# Patient Record
Sex: Female | Born: 1935 | Race: White | Hispanic: No | Smoking: Former smoker
Health system: Southern US, Community
[De-identification: ages and names within clinical notes are randomized; demographics above are authoritative.]

## PROBLEM LIST (undated history)

## (undated) DIAGNOSIS — F329 Major depressive disorder, single episode, unspecified: Secondary | ICD-10-CM

## (undated) DIAGNOSIS — I10 Essential (primary) hypertension: Secondary | ICD-10-CM

## (undated) DIAGNOSIS — R011 Cardiac murmur, unspecified: Secondary | ICD-10-CM

## (undated) DIAGNOSIS — I350 Nonrheumatic aortic (valve) stenosis: Secondary | ICD-10-CM

## (undated) DIAGNOSIS — R479 Unspecified speech disturbances: Secondary | ICD-10-CM

## (undated) DIAGNOSIS — F1010 Alcohol abuse, uncomplicated: Secondary | ICD-10-CM

## (undated) DIAGNOSIS — J449 Chronic obstructive pulmonary disease, unspecified: Secondary | ICD-10-CM

## (undated) DIAGNOSIS — M4712 Other spondylosis with myelopathy, cervical region: Secondary | ICD-10-CM

## (undated) DIAGNOSIS — Z9981 Dependence on supplemental oxygen: Secondary | ICD-10-CM

## (undated) DIAGNOSIS — D649 Anemia, unspecified: Secondary | ICD-10-CM

## (undated) DIAGNOSIS — Z9289 Personal history of other medical treatment: Secondary | ICD-10-CM

## (undated) DIAGNOSIS — F0390 Unspecified dementia without behavioral disturbance: Secondary | ICD-10-CM

## (undated) DIAGNOSIS — F419 Anxiety disorder, unspecified: Secondary | ICD-10-CM

## (undated) DIAGNOSIS — Y92009 Unspecified place in unspecified non-institutional (private) residence as the place of occurrence of the external cause: Secondary | ICD-10-CM

## (undated) DIAGNOSIS — F32A Depression, unspecified: Secondary | ICD-10-CM

## (undated) DIAGNOSIS — W19XXXA Unspecified fall, initial encounter: Secondary | ICD-10-CM

## (undated) HISTORY — DX: Major depressive disorder, single episode, unspecified: F32.9

## (undated) HISTORY — DX: Depression, unspecified: F32.A

## (undated) HISTORY — DX: Unspecified dementia without behavioral disturbance: F03.90

## (undated) HISTORY — DX: Essential (primary) hypertension: I10

## (undated) HISTORY — PX: WRIST FRACTURE SURGERY: SHX121

## (undated) HISTORY — DX: Chronic obstructive pulmonary disease, unspecified: J44.9

## (undated) HISTORY — PX: HIP FRACTURE SURGERY: SHX118

## (undated) HISTORY — PX: FRACTURE SURGERY: SHX138

## (undated) HISTORY — PX: BACK SURGERY: SHX140

## (undated) HISTORY — DX: Other spondylosis with myelopathy, cervical region: M47.12

## (undated) HISTORY — DX: Nonrheumatic aortic (valve) stenosis: I35.0

## (undated) HISTORY — PX: CATARACT EXTRACTION W/ INTRAOCULAR LENS  IMPLANT, BILATERAL: SHX1307

## (undated) HISTORY — PX: BLADDER SUSPENSION: SHX72

---

## 1993-02-24 HISTORY — PX: LAPAROSCOPIC CHOLECYSTECTOMY: SUR755

## 2007-06-09 ENCOUNTER — Encounter: Payer: Self-pay | Admitting: Internal Medicine

## 2007-08-11 ENCOUNTER — Encounter: Payer: Self-pay | Admitting: Internal Medicine

## 2007-08-11 ENCOUNTER — Encounter: Admission: RE | Admit: 2007-08-11 | Discharge: 2007-08-11 | Payer: Self-pay | Admitting: Family Medicine

## 2007-09-14 ENCOUNTER — Encounter: Payer: Self-pay | Admitting: Internal Medicine

## 2007-11-25 ENCOUNTER — Encounter: Payer: Self-pay | Admitting: Internal Medicine

## 2008-06-14 ENCOUNTER — Ambulatory Visit: Payer: Self-pay | Admitting: Critical Care Medicine

## 2008-06-14 DIAGNOSIS — M810 Age-related osteoporosis without current pathological fracture: Secondary | ICD-10-CM | POA: Insufficient documentation

## 2008-06-14 DIAGNOSIS — I10 Essential (primary) hypertension: Secondary | ICD-10-CM

## 2008-06-14 DIAGNOSIS — J4489 Other specified chronic obstructive pulmonary disease: Secondary | ICD-10-CM | POA: Insufficient documentation

## 2008-06-14 DIAGNOSIS — F17200 Nicotine dependence, unspecified, uncomplicated: Secondary | ICD-10-CM | POA: Insufficient documentation

## 2008-06-14 DIAGNOSIS — J449 Chronic obstructive pulmonary disease, unspecified: Secondary | ICD-10-CM | POA: Insufficient documentation

## 2008-08-08 ENCOUNTER — Encounter: Payer: Self-pay | Admitting: Critical Care Medicine

## 2008-08-16 ENCOUNTER — Ambulatory Visit: Payer: Self-pay | Admitting: Critical Care Medicine

## 2008-09-27 ENCOUNTER — Telehealth: Payer: Self-pay | Admitting: Critical Care Medicine

## 2008-12-12 ENCOUNTER — Ambulatory Visit: Payer: Self-pay | Admitting: Internal Medicine

## 2008-12-12 DIAGNOSIS — F419 Anxiety disorder, unspecified: Secondary | ICD-10-CM

## 2008-12-12 DIAGNOSIS — F329 Major depressive disorder, single episode, unspecified: Secondary | ICD-10-CM | POA: Insufficient documentation

## 2008-12-12 DIAGNOSIS — R5383 Other fatigue: Secondary | ICD-10-CM

## 2008-12-12 DIAGNOSIS — R5381 Other malaise: Secondary | ICD-10-CM

## 2008-12-12 DIAGNOSIS — M5000 Cervical disc disorder with myelopathy, unspecified cervical region: Secondary | ICD-10-CM | POA: Insufficient documentation

## 2008-12-12 DIAGNOSIS — E785 Hyperlipidemia, unspecified: Secondary | ICD-10-CM

## 2008-12-12 LAB — CONVERTED CEMR LAB
ALT: 22 units/L (ref 0–35)
AST: 24 units/L (ref 0–37)
Albumin: 4.2 g/dL (ref 3.5–5.2)
Alkaline Phosphatase: 78 units/L (ref 39–117)
BUN: 10 mg/dL (ref 6–23)
Basophils Absolute: 0.1 10*3/uL (ref 0.0–0.1)
Basophils Relative: 0.7 % (ref 0.0–3.0)
Bilirubin, Direct: 0.1 mg/dL (ref 0.0–0.3)
CO2: 27 meq/L (ref 19–32)
Calcium: 9.7 mg/dL (ref 8.4–10.5)
Chloride: 104 meq/L (ref 96–112)
Cholesterol: 295 mg/dL — ABNORMAL HIGH (ref 0–200)
Creatinine, Ser: 0.7 mg/dL (ref 0.4–1.2)
Direct LDL: 189.2 mg/dL
Eosinophils Absolute: 0.1 10*3/uL (ref 0.0–0.7)
Eosinophils Relative: 1.1 % (ref 0.0–5.0)
Folate: 11.4 ng/mL
GFR calc non Af Amer: 86.32 mL/min (ref >60)
Glucose, Bld: 110 mg/dL — ABNORMAL HIGH (ref 70–99)
HCT: 42.3 % (ref 36.0–46.0)
HDL: 85.6 mg/dL (ref >39.00)
Hemoglobin: 14.4 g/dL (ref 12.0–15.0)
Lymphocytes Relative: 16.3 % (ref 12.0–46.0)
Lymphs Abs: 1.6 10*3/uL (ref 0.7–4.0)
MCHC: 33.9 g/dL (ref 30.0–36.0)
MCV: 96.7 fL (ref 78.0–100.0)
Monocytes Absolute: 0.7 10*3/uL (ref 0.1–1.0)
Monocytes Relative: 7 % (ref 3.0–12.0)
Neutro Abs: 7.5 10*3/uL (ref 1.4–7.7)
Neutrophils Relative %: 74.9 % (ref 43.0–77.0)
Platelets: 180 10*3/uL (ref 150.0–400.0)
Potassium: 4.3 meq/L (ref 3.5–5.1)
RBC: 4.37 M/uL (ref 3.87–5.11)
RDW: 12.7 % (ref 11.5–14.6)
Sodium: 141 meq/L (ref 135–145)
TSH: 1.75 microintl units/mL (ref 0.35–5.50)
Total Bilirubin: 0.7 mg/dL (ref 0.3–1.2)
Total CHOL/HDL Ratio: 3
Total Protein: 8.4 g/dL — ABNORMAL HIGH (ref 6.0–8.3)
Triglycerides: 95 mg/dL (ref 0.0–149.0)
VLDL: 19 mg/dL (ref 0.0–40.0)
Vitamin B-12: 348 pg/mL (ref 211–911)
WBC: 10 10*3/uL (ref 4.5–10.5)

## 2008-12-25 ENCOUNTER — Encounter: Payer: Self-pay | Admitting: Internal Medicine

## 2008-12-29 ENCOUNTER — Encounter: Admission: RE | Admit: 2008-12-29 | Discharge: 2008-12-29 | Payer: Self-pay | Admitting: Neurological Surgery

## 2009-01-02 ENCOUNTER — Emergency Department (HOSPITAL_COMMUNITY): Admission: EM | Admit: 2009-01-02 | Discharge: 2009-01-02 | Payer: Self-pay | Admitting: Emergency Medicine

## 2009-01-10 ENCOUNTER — Encounter: Payer: Self-pay | Admitting: Internal Medicine

## 2009-01-12 ENCOUNTER — Ambulatory Visit: Payer: Self-pay | Admitting: Internal Medicine

## 2009-01-12 DIAGNOSIS — N3946 Mixed incontinence: Secondary | ICD-10-CM

## 2009-01-23 ENCOUNTER — Inpatient Hospital Stay (HOSPITAL_COMMUNITY): Admission: RE | Admit: 2009-01-23 | Discharge: 2009-01-24 | Payer: Self-pay | Admitting: Neurological Surgery

## 2009-01-24 DIAGNOSIS — M4712 Other spondylosis with myelopathy, cervical region: Secondary | ICD-10-CM

## 2009-01-24 HISTORY — DX: Other spondylosis with myelopathy, cervical region: M47.12

## 2009-01-24 HISTORY — PX: POSTERIOR LAMINECTOMY / DECOMPRESSION CERVICAL SPINE: SUR739

## 2009-02-01 ENCOUNTER — Ambulatory Visit: Payer: Self-pay | Admitting: Pulmonary Disease

## 2009-02-01 DIAGNOSIS — R059 Cough, unspecified: Secondary | ICD-10-CM | POA: Insufficient documentation

## 2009-02-01 DIAGNOSIS — R05 Cough: Secondary | ICD-10-CM

## 2009-02-05 ENCOUNTER — Ambulatory Visit: Payer: Self-pay | Admitting: Internal Medicine

## 2009-02-13 ENCOUNTER — Encounter: Payer: Self-pay | Admitting: Internal Medicine

## 2009-03-02 ENCOUNTER — Telehealth: Payer: Self-pay | Admitting: Internal Medicine

## 2009-03-16 ENCOUNTER — Encounter: Payer: Self-pay | Admitting: Internal Medicine

## 2009-05-14 ENCOUNTER — Encounter: Payer: Self-pay | Admitting: Internal Medicine

## 2009-06-11 ENCOUNTER — Encounter: Payer: Self-pay | Admitting: Internal Medicine

## 2009-06-29 ENCOUNTER — Ambulatory Visit: Payer: Self-pay | Admitting: Internal Medicine

## 2009-07-02 LAB — CONVERTED CEMR LAB
Basophils Absolute: 0 10*3/uL (ref 0.0–0.1)
Basophils Relative: 0.3 % (ref 0.0–3.0)
Eosinophils Absolute: 0.1 10*3/uL (ref 0.0–0.7)
Eosinophils Relative: 1.4 % (ref 0.0–5.0)
HCT: 40.4 % (ref 36.0–46.0)
Hemoglobin: 13.9 g/dL (ref 12.0–15.0)
Lymphocytes Relative: 23.3 % (ref 12.0–46.0)
Lymphs Abs: 2 10*3/uL (ref 0.7–4.0)
MCHC: 34.5 g/dL (ref 30.0–36.0)
MCV: 93.4 fL (ref 78.0–100.0)
Monocytes Absolute: 0.7 10*3/uL (ref 0.1–1.0)
Monocytes Relative: 7.9 % (ref 3.0–12.0)
Neutro Abs: 5.9 10*3/uL (ref 1.4–7.7)
Neutrophils Relative %: 67.1 % (ref 43.0–77.0)
Platelets: 221 10*3/uL (ref 150.0–400.0)
RBC: 4.33 M/uL (ref 3.87–5.11)
RDW: 15.5 % — ABNORMAL HIGH (ref 11.5–14.6)
TSH: 1.35 microintl units/mL (ref 0.35–5.50)
WBC: 8.7 10*3/uL (ref 4.5–10.5)

## 2009-08-08 ENCOUNTER — Ambulatory Visit: Payer: Self-pay | Admitting: Internal Medicine

## 2009-11-24 HISTORY — PX: ORIF HIP FRACTURE: SHX2125

## 2009-12-05 ENCOUNTER — Inpatient Hospital Stay (HOSPITAL_COMMUNITY): Admission: EM | Admit: 2009-12-05 | Discharge: 2009-12-10 | Payer: Self-pay | Admitting: Emergency Medicine

## 2010-02-11 ENCOUNTER — Ambulatory Visit: Payer: Self-pay | Admitting: Internal Medicine

## 2010-02-12 ENCOUNTER — Telehealth: Payer: Self-pay | Admitting: Internal Medicine

## 2010-02-12 ENCOUNTER — Encounter: Payer: Self-pay | Admitting: Internal Medicine

## 2010-02-22 ENCOUNTER — Telehealth: Payer: Self-pay | Admitting: Internal Medicine

## 2010-02-22 ENCOUNTER — Ambulatory Visit: Payer: Self-pay | Admitting: Internal Medicine

## 2010-03-26 NOTE — Letter (Signed)
Summary: [facility]  [facility]   Imported By: Lester [place] 02/26/2009 10:45:42  _____________________________________________________________________  External Attachment:    Type:   Image     Comment:   External Document

## 2010-03-26 NOTE — Assessment & Plan Note (Signed)
Summary: COUGH---STC   Vital Signs:  Patient profile:   77 year old female Height:      64 inches (162.56 cm) Weight:      146.0 pounds (66.36 kg) O2 Sat:      93 % Temp:     97.9 degrees F (36.61 degrees C) oral Pulse rate:   120 / minute BP sitting:   112 / 68  (left arm) Cuff size:   regular  Vitals Entered By: Orlan Leavens (February 05, 2009 4:28 PM) CC: Ongoing cough. was rx tessalon perles on friday drom dr. Shelle Iron. pt states med is not helping Is Patient Diabetic? No Pain Assessment Patient in pain? no        Primary Care Provider:  Raenette Rover. Felicity Coyer, MD  CC:  Ongoing cough. was rx tessalon perles on friday drom dr. Shelle Iron. pt states med is not helping.  History of Present Illness: here today with complaint of  cough . onset of symptoms was 7-10 days  ago. course has been gradual onset and now occurs in intermittent pattern, esp bad at night. problem precipitated by recent neck surg - but says cough did not start until 5 days after operation - symptom characterized as irritation and dryness in throat problem associated with yellow sputum and cough  but not associated with fever, hoarsness or trouble swallowing . symptoms improved by nothing - saw pulm 12/9 and given tessaoln (no change) - unable to swallow Mucinex due to large size of pill. symptoms worsened with lying flat. + prior hx of same symptoms while smoking but has quit smoking.   Current Medications (verified): 1)  Benicar 20 Mg Tabs (Olmesartan Medoxomil) .Marland Kitchen.. 1 By Mouth Daily 2)  Alendronate Sodium 70 Mg Tabs (Alendronate Sodium) .... Take 1 Tab By Mouth Once A Month 3)  Chewable Calcium/d 300-100 Mg-Unit Chew (Calcium-Vitamin D) .... 2 Tabs By Mouth Two Times A Day 4)  Symbicort 160-4.5 Mcg/act  Aero (Budesonide-Formoterol Fumarate) .... Two Puffs Twice Daily 5)  Omeprazole 20 Mg Cpdr (Omeprazole) .... Take 1 Q Am 6)  Chantix Starting Month Pak 0.5 Mg X 11 & 1 Mg X 42 Tabs (Varenicline Tartrate) ....  Take As Directed 7)  Trazodone Hcl 50 Mg Tabs (Trazodone Hcl) .... Take 1 Tab By Mouth At Bedtime 8)  Oxycodone-Acetaminophen 5-325 Mg Tabs (Oxycodone-Acetaminophen) .... Take 1 Tab By Mouth Every 4 To 6 Hours As Needed For Pain 9)  Tessalon Perles 100 Mg  Caps (Benzonatate) .... 2 Pearls Every 6 Hours If Needed For Cough  Allergies (verified): No Known Drug Allergies  Past History:  Past Medical History: Hypertension Dyslipidemia Osteoporosis COPD cervical stenosis with myelopathy  MD rooster - pulm - [facility]  Past Surgical History: Bladder tuck  Cholecystectomy  1995 L ORIF (vs pinning x 2) hip fx repair 2006 cervical decompression 01/2009 - [facility]  Review of Systems  The patient denies fever, weight loss, hoarseness, chest pain, dyspnea on exertion, and hemoptysis.    Physical Exam  General:  alert, well-developed, well-nourished, and cooperative to examination.   dtr at side Neck:  well healed inscion left side of anterior neck w/o bruising, drainage or erythema -  Lungs:  normal respiratory effort, no intercostal retractions or use of accessory muscles; normal breath sounds bilaterally - no crackles and no wheezes.    Heart:  normal rate, regular rhythm, no murmur, and no rub. BLE without edema.    Impression & Recommendations:  Problem # 1:  COUGH (  [id number]-096.0)  I agree with dr. clance's opinion from 12/9 which is as follows: I suspect the cough is due to upper airway issues.  She has recently had neck surgery with anterior cervical approach, and this often involves compression of neck structures, including larynx and trachea.  This often leads to upper airway cough postoperatively, along with irritation from ETT.  She denies chest congestion or increased sob.Marland KitchenMarland KitchenI doubt this represents a bronchitis, but will keep in mind.  She has definite postnasal drip, but no purulence in her nares.  I would like to start with conservative treatment, including an  antihistamine at bedtime to help with postnasal drip, along with mucinex and antitussive.  She is to let us know if begins to have purulent mucus.  as pt now with yellow sputum, with hx COPD and recent hosp, will treat with abx - zpack also encouraged use of robitussin (as mucinex too big to swallow) and compliance with PPI once daily , not pnr for heartburn if cont cough after next 7-10d, should call for eval by dr. Danielle Dess given recent surg  Orders: Prescription Created Electronically [phone])  Complete Medication List: 1)  Benicar 20 Mg Tabs (Olmesartan medoxomil) .Marland Kitchen.. 1 by mouth daily 2)  Alendronate Sodium 70 Mg Tabs (Alendronate sodium) .... Take 1 tab by mouth once a month 3)  Chewable Calcium/d 300-100 Mg-unit Chew (Calcium-vitamin d) .... 2 tabs by mouth two times a day 4)  Symbicort 160-4.5 Mcg/act Aero (Budesonide-formoterol fumarate) .... Two puffs twice daily 5)  Chantix Starting Month Pak 0.5 Mg X 11 & 1 Mg X 42 Tabs (Varenicline tartrate) .... Take as directed 6)  Trazodone Hcl 50 Mg Tabs (Trazodone hcl) .... Take 1 tab by mouth at bedtime 7)  Oxycodone-acetaminophen 5-325 Mg Tabs (Oxycodone-acetaminophen) .... Take 1 tab by mouth every 4 to 6 hours as needed for pain 8)  Omeprazole 20 Mg Cpdr (Omeprazole) .... Take 1 by mouth every morning 9)  Tessalon Perles 100 Mg Caps (Benzonatate) .... 2 pearls every 6 hours if needed for cough 10)  Azithromycin 250 Mg Tabs (Azithromycin) .... 2 tabs by mouth today, then 1 by mouth daily starting tomorrow 11)  Robitussin Chest Congestion 100 Mg/18ml Syrp (Guaifenesin) .... 5 cc by mouth four times a day x 7 days, then as needed for cough  Patient Instructions: 1)  it was good to see you today. 2)  antibiotics - azithromycin - your prescription has been electronically submitted to your pharmacy. Please take as directed. Contact our office if you believe you're having problems with the medication(s). 3)  continue the tessalon perles - add to  this Robitussin syrup (in place of Mucinex) and be sure to take the omeprazole once daily (not just as needed for reflux) 4)  if continued cough after next 7-10days or if any problems swallowing, call dr. Verlee Rossetti office 5)  if your symptoms continue to worsen (chest pain, fever, etc), or if you are unable take anything by mouth (pills, fluids, etc), you should go to the emergency room for further evaluation and treatment.  Prescriptions: AZITHROMYCIN 250 MG TABS (AZITHROMYCIN) 2 tabs by mouth today, then 1 by mouth daily starting tomorrow  #6 x 0   Entered and Authorized by:   Newt Lukes MD   Signed by:   Newt Lukes MD on 02/05/2009   Method used:   Electronically to        [facility] [address] [phone]* (retail)       [phone]  [address]       [place], [place]  [address]       Ph: [phone] or [phone]       Fax: [phone]   RxID:   [id number]

## 2010-03-26 NOTE — Letter (Signed)
Summary: [facility]  [facility]   Imported By: Lester [place] 06/29/2009 10:39:45  _____________________________________________________________________  External Attachment:    Type:   Image     Comment:   External Document

## 2010-03-26 NOTE — Letter (Signed)
Summary: [facility]  [facility]   Imported By: Lester  03/28/2009 09:18:47  _____________________________________________________________________  External Attachment:    Type:   Image     Comment:   External Document

## 2010-03-26 NOTE — Assessment & Plan Note (Signed)
Summary: DEPRESSION---PER DAU---STC   Vital Signs:  Patient profile:   77 year old female Height:      64 inches (162.56 cm) Weight:      144.8 pounds (65.82 kg) O2 Sat:      93 % on Room air Temp:     97.9 degrees F (36.61 degrees C) oral Pulse rate:   113 / minute BP sitting:   140 / 70  (left arm) Cuff size:   regular  Vitals Entered By: Orlan Leavens (Jun 29, 2009 2:23 PM)  O2 Flow:  Room air CC: Depression/ Req sample of symbicort, Depressive symptoms Is Patient Diabetic? No Pain Assessment Patient in pain? no        Primary Care Provider:  Raenette Rover. Felicity Coyer, MD  CC:  Depression/ Req sample of symbicort and Depressive symptoms.  History of Present Illness:  Depressive Symptoms      This is a 77 year old woman who presents with Depressive symptoms.  The symptoms began 3 months ago.  The severity is described as moderate.  here at urging of dtr -.  The patient reports depressed mood, loss of interest/pleasure, hypersomnia, and psychomotor retardation, but denies significant weight loss, significant weight gain, and insomnia.  The patient also reports fatigue or loss of energy.  The patient denies feelings of worthlessness, diminished concentration, indecisiveness, thoughts of death, thoughts of suicide, suicidal intent, and suicidal plans.  Patient's past history includes alcohol/substance abuse, depression, family hx of alcoholism, and family hx of depression.  The patient denies abnormally irritable mood, decreased need for sleep, and distractibility.    Clinical Review Panels:  CBC   WBC:  10.0 (12/12/2008)   RBC:  4.37 (12/12/2008)   Hgb:  14.4 (12/12/2008)   Hct:  42.3 (12/12/2008)   Platelets:  180.0 (12/12/2008)   MCV  96.7 (12/12/2008)   MCHC  33.9 (12/12/2008)   RDW  12.7 (12/12/2008)   PMN:  74.9 (12/12/2008)   Lymphs:  16.3 (12/12/2008)   Monos:  7.0 (12/12/2008)   Eosinophils:  1.1 (12/12/2008)   Basophil:  0.7 (12/12/2008)  Complete Metabolic Panel   Glucose:  110 (12/12/2008)   Sodium:  141 (12/12/2008)   Potassium:  4.3 (12/12/2008)   Chloride:  104 (12/12/2008)   CO2:  27 (12/12/2008)   BUN:  10 (12/12/2008)   Creatinine:  0.7 (12/12/2008)   Albumin:  4.2 (12/12/2008)   Total Protein:  8.4 (12/12/2008)   Calcium:  9.7 (12/12/2008)   Total Bili:  0.7 (12/12/2008)   Alk Phos:  78 (12/12/2008)   SGPT (ALT):  22 (12/12/2008)   SGOT (AST):  24 (12/12/2008)   Current Medications (verified): 1)  Benicar 20 Mg Tabs (Olmesartan Medoxomil) .Marland Kitchen.. 1 By Mouth Daily 2)  Alendronate Sodium 70 Mg Tabs (Alendronate Sodium) .... Take 1 Tab By Mouth Once A Month 3)  Chewable Calcium/d 300-100 Mg-Unit Chew (Calcium-Vitamin D) .... 2 Tabs By Mouth Two Times A Day 4)  Omeprazole 20 Mg Cpdr (Omeprazole) .... Take 1 By Mouth Every Morning 5)  Symbicort 160-4.5 Mcg/act Aero (Budesonide-Formoterol Fumarate) .... Use 1 Spray Two Times A Day As Needed  Allergies (verified): No Known Drug Allergies  Past History:  Past Medical History: Hypertension Dyslipidemia Osteoporosis COPD cervical stenosis with myelopathy depression  MD rooster - pulm - [facility]  Review of Systems       The patient complains of anorexia.  The patient denies fever and headaches.    Physical Exam  General:  alert, well-developed, well-nourished, and cooperative to examination.   dtr at side Lungs:  normal respiratory effort, no intercostal retractions or use of accessory muscles; normal breath sounds bilaterally - no crackles and no wheezes.    Heart:  normal rate, regular rhythm, no murmur, and no rub. BLE without edema.  Psych:  Oriented X3, memory intact for recent and remote, normally interactive, good eye contact, not anxious appearing, mildly depressed appearing, and not agitated.   tearful at times   Impression & Recommendations:  Problem # 1:  DEPRESSION (ICD-311)  The following medications were removed from the medication list:     Trazodone Hcl 50 Mg Tabs (Trazodone hcl) .Marland Kitchen... Take 1 tab by mouth at bedtime Her updated medication list for this problem includes:    Cymbalta 30 Mg Cpep (Duloxetine hcl) .Marland Kitchen... 1 by mouth once daily  Orders: TLB-CBC Platelet - w/Differential (85025-CBCD) TLB-TSH (Thyroid Stimulating Hormone) (16109-UEA) Prescription Created Electronically [phone])  Complete Medication List: 1)  Benicar 20 Mg Tabs (Olmesartan medoxomil) .Marland Kitchen.. 1 by mouth daily 2)  Alendronate Sodium 70 Mg Tabs (Alendronate sodium) .... Take 1 tab by mouth once a month 3)  Chewable Calcium/d 300-100 Mg-unit Chew (Calcium-vitamin d) .... 2 tabs by mouth two times a day 4)  Omeprazole 20 Mg Cpdr (Omeprazole) .... Take 1 by mouth every morning 5)  Symbicort 160-4.5 Mcg/act Aero (Budesonide-formoterol fumarate) .... Use 1 puff two times a day as needed 6)  Cymbalta 30 Mg Cpep (Duloxetine hcl) .Marland Kitchen.. 1 by mouth once daily  Patient Instructions: 1)  it was good to see you today. 2)  test(s) ordered today - your results will be posted on the phone tree for review in 48-72 hours from the time of test completion; call [phone] and enter your 9 digit MRN (listed above on this page, just below your name); if any changes need to be made or there are abnormal results, you will be contacted directly.  3)  start new medication for depression - cymbalta - 2 week sample given and your prescription has been electronically submitted to your pharmacy. Please take as directed. Contact our office if you believe you're having problems with the medication(s).  4)  Please schedule a follow-up appointment in 4-6 weeks, sooner if problems.  Prescriptions: CYMBALTA 30 MG CPEP (DULOXETINE HCL) 1 by mouth once daily  #30 x 1   Entered and Authorized by:   Newt Lukes MD   Signed by:   Newt Lukes MD on 06/29/2009   Method used:   Electronically to        [facility] [address] [phone]* (retail)       [address]       [place], [place]  [address]       Ph: [phone] or [phone]       Fax: [phone]   RxID:   [id number]

## 2010-03-26 NOTE — Progress Notes (Signed)
Summary: Beckie Salts  Phone Note Refill Request Message from:  Fax from Pharmacy on March 02, 2009 4:13 PM  Refills Requested: Medication #1:  TESSALON PERLES 100 MG  CAPS 2 pearls every 6 hours if needed for cough  Method Requested: Electronic Initial call taken by: Orlan Leavens,  March 02, 2009 4:14 PM    Prescriptions: TESSALON PERLES 100 MG  CAPS (BENZONATATE) 2 pearls every 6 hours if needed for cough  #30 x 0   Entered by:   Orlan Leavens   Authorized by:   Newt Lukes MD   Signed by:   Orlan Leavens on 03/02/2009   Method used:   Electronically to        [facility] [address] [phone]* (retail)       [address]       [place], [place]  [address]       Ph: [phone] or [phone]       Fax: [phone]   RxID:   [id number]

## 2010-03-26 NOTE — Letter (Signed)
Summary: [facility]  [facility]   Imported By: Lester [place] 06/07/2009 11:21:38  _____________________________________________________________________  External Attachment:    Type:   Image     Comment:   External Document

## 2010-03-28 NOTE — Progress Notes (Signed)
Summary: Prolia - approved and sched apt 02/22/10  Phone Note Outgoing Call   Call placed by: Lanier Prude, [facility]),  February 12, 2010 11:01 AM Summary of Call: faxed Prolia ins verification request...Marland KitchenMarland KitchenMarland Kitchenwill wait on benefits summary.  Follow-up for Phone Call        rec benefits summary for pt. Pt will owe $0 OOP. pt informed. And she is coming 02-22-10 at 2:00pm. Follow-up by: Lanier Prude, [facility]),  February 15, 2010 4:53 PM  Additional Follow-up for Phone Call Additional follow up Details #1::        great - thanks! Additional Follow-up by: Newt Lukes MD,  February 19, 2010 8:32 AM    New/Updated Medications: PROLIA 60 MG/ML SOLN (DENOSUMAB) injection every 6 months

## 2010-03-28 NOTE — Assessment & Plan Note (Signed)
Summary: prolia/zero out of pocket/SD  Nurse Visit   Allergies: No Known Drug Allergies  Medication Administration  Injection # 1:    Medication: Prolia 60mg     Diagnosis: OSTEOPOROSIS (ICD-733.00)    Route: SQ    Site: Left arm     Exp Date: 02/25/2011    Lot #: [id number]    Mfr: AMGEN    Patient tolerated injection without complications    Given by: Lamar Sprinkles, CMA (February 22, 2010 3:12 PM)  Orders Added: 1)  Admin of Therapeutic Inj  intramuscular or subcutaneous [96372] 2)  Prolia 60mg  [J3590]   Medication Administration  Injection # 1:    Medication: Prolia 60mg     Diagnosis: OSTEOPOROSIS (ICD-733.00)    Route: SQ    Site: Left arm     Exp Date: 02/25/2011    Lot #: [id number]    Mfr: AMGEN    Patient tolerated injection without complications    Given by: Lamar Sprinkles, CMA (February 22, 2010 3:12 PM)  Orders Added: 1)  Admin of Therapeutic Inj  intramuscular or subcutaneous [96372] 2)  Prolia 60mg  [J3590]

## 2010-03-28 NOTE — Medication Information (Signed)
Summary: Ins Verification for Prolia/ProliaPlus  Ins Verification for Prolia/ProliaPlus   Imported By: Sherian Rein 02/19/2010 14:06:58  _____________________________________________________________________  External Attachment:    Type:   Image     Comment:   External Document

## 2010-03-28 NOTE — Progress Notes (Signed)
Summary: OMEPRAZOLE  Phone Note Call from Patient   Summary of Call: Patient is requesting to resume omeprazole 20 mg one once daily. Initial call taken by: Lamar Sprinkles, CMA,  February 22, 2010 3:07 PM  Follow-up for Phone Call        ok - erx done Follow-up by: Newt Lukes MD,  February 22, 2010 4:49 PM  Additional Follow-up for Phone Call Additional follow up Details #1::        Pt's spouse advised  Additional Follow-up by: Margaret Pyle, CMA,  February 22, 2010 4:55 PM    New/Updated Medications: OMEPRAZOLE 20 MG CPDR (OMEPRAZOLE) 1 by mouth once daily Prescriptions: OMEPRAZOLE 20 MG CPDR (OMEPRAZOLE) 1 by mouth once daily  #30 x 6   Entered and Authorized by:   Newt Lukes MD   Signed by:   Newt Lukes MD on 02/22/2010   Method used:   Electronically to        [facility] [address] [phone]* (retail)       [address]       [place], [place]  [address]       Ph: [phone] or [phone]       Fax: [phone]   RxID:   [id number]

## 2010-03-28 NOTE — Assessment & Plan Note (Signed)
Summary: FOLLOW UP/POST REHAB/LB   Vital Signs:  Patient profile:   77 year old female Height:      64 inches (162.56 cm) Weight:      139.12 pounds (63.24 kg) BMI:     23.97 O2 Sat:      94 % on Room air Temp:     97.0 degrees F (36.11 degrees C) oral Pulse rate:   115 / minute BP sitting:   122 / 70  (left arm) Cuff size:   regular  Vitals Entered By: Orlan Leavens RMA (February 11, 2010 10:28 AM)  O2 Flow:  Room air CC: follow-up visit Is Patient Diabetic? No Comments Discuss meds not taking fosamax,omeprazole, or cymbalta   Primary Care Provider:  Raenette Rover. Felicity Coyer, MD  CC:  follow-up visit.  History of Present Illness: here for f/u  osteoporosis - recent accidental fall and R hip fx 11/2009 stopped alendronate weekly due to cost and "forgot" to take  hx prior L hip fx 2006 s/p accidental fall -   HTN - reports compliance with ongoing medical treatment and no changes in medication dose or frequency. denies adverse side effects related to current therapy.   COPD -in process of quitting smoking had chronic cough that "has completely resolved" since starting symicort  depression and insomnia - no longer on meds (tried cymbalta 06/2009) -  use generic ambein as needed since snf stay for hip rehab 12/2009 denies sadness or tearfulness  Current Medications (verified): 1)  Benicar 20 Mg Tabs (Olmesartan Medoxomil) .Marland Kitchen.. 1 By Mouth Daily 2)  Alendronate Sodium 70 Mg Tabs (Alendronate Sodium) .... Take 1 Tab By Mouth Once A Month 3)  Chewable Calcium/d 300-100 Mg-Unit Chew (Calcium-Vitamin D) .... 2 Tabs By Mouth Two Times A Day 4)  Omeprazole 20 Mg Cpdr (Omeprazole) .... Take 1 By Mouth Every Morning 5)  Symbicort 160-4.5 Mcg/act Aero (Budesonide-Formoterol Fumarate) .... Inhale 2 Puffs Two Times A Day  Needs Appt With Dr Delford Field 6)  Cymbalta 30 Mg Cpep (Duloxetine Hcl) .Marland Kitchen.. 1 By Mouth Once Daily 7)  Benzonatate 100 Mg Caps (Benzonatate) .... Take 2 Capsule Q 6 Hours As  Needed 8)  Methocarbamol 500 Mg Tabs (Methocarbamol) .... Take 1 Evety 6 Hours 9)  Hydrocodone-Acetaminophen 5-325 Mg Tabs (Hydrocodone-Acetaminophen) .... Take 1-2 Every 4-6 Hours As Needed 10)  Zolpidem Tartrate 5 Mg Tabs (Zolpidem Tartrate) .... Take 1 At Bedtime As Needed  Allergies (verified): No Known Drug Allergies  Past History:  Past Medical History: Hypertension Dyslipidemia COPD cervical stenosis with myelopathy -decompression 01/2009 depression osteoporosis - right hip fx 11/2009, left hip fx 2006  MD roster - pulm - Delford Field neurosurg - [facility] - [facility]  Past Surgical History: Bladder tuck  Cholecystectomy  1995 L ORIF (vs pinning x 2) hip fx repair 2006 cervical decompression 01/2009 - [facility] R hip ORIF 11/2009 -[facility]  Family History: Mother  expired 35yo Father expired AMI     Social History: Patient is a current smoker.  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    Physical Exam  General:  alert, well-developed, well-nourished, and cooperative to examination.   dtr at side Lungs:  normal respiratory effort, no intercostal retractions or use of accessory muscles; normal breath sounds bilaterally - no crackles and no wheezes.    Heart:  normal rate, regular  rhythm, no murmur, and no rub. BLE without edema.    Impression & Recommendations:  Problem # 1:  OSTEOPOROSIS (ICD-733.00) hip fx s/p fall 11/2009 - now s/p 5 week snf - return home last 72h cont hh pt/ot f/u ortho as needed ([facility]) - full WB and uses RW for balance - change aldendronate to Prolia (was not taking bisphos prior to fx - noncompliance) if approved Her updated medication list for this problem includes:    Prolia 60 Mg/ml Soln (Denosumab) ..... Injection every 6 months  Problem # 2:  HYPERTENSION  (ICD-401.9)  Her updated medication list for this problem includes:    Benicar 20 Mg Tabs (Olmesartan medoxomil) .Marland Kitchen... 1 by mouth daily  BP today: 122/70 Prior BP: 140/70 (06/29/2009)  Labs Reviewed: K+: 4.3 (12/12/2008) Creat: : 0.7 (12/12/2008)   Chol: 295 (12/12/2008)   HDL: 85.60 (12/12/2008)   TG: 95.0 (12/12/2008)  Complete Medication List: 1)  Benicar 20 Mg Tabs (Olmesartan medoxomil) .Marland Kitchen.. 1 by mouth daily 2)  Oscal 500/200 D-3 500-200 Mg-unit Tabs (Calcium carbonate-vitamin d) .... 2 by mouth two times a day 3)  Prolia 60 Mg/ml Soln (Denosumab) .... Injection every 6 months 4)  Symbicort 160-4.5 Mcg/act Aero (Budesonide-formoterol fumarate) .... Inhale 2 puffs two times a day  needs appt with dr Delford Field 5)  Methocarbamol 500 Mg Tabs (Methocarbamol) .... Take 1 evety 6 hours 6)  Hydrocodone-acetaminophen 5-325 Mg Tabs (Hydrocodone-acetaminophen) .... Take 1-2 every 4-6 hours as needed 7)  Zolpidem Tartrate 5 Mg Tabs (Zolpidem tartrate) .... Take 1 at bedtime as needed  Patient Instructions: 1)  it was good to see you today. 2)  change bone medication treatment to Prolia - Our office will contact you regarding this treatment once approved - shot in office every 6 months instead of pills - 3)  take OsCal 2 pills two times a day for calcium and vit d 4)  no other medication changes 5)  Please schedule a follow-up appointment in 3 months to review blood pressure and medications, call sooner if problems.    Orders Added: 1)  Est. Patient Level IV [04540]

## 2010-05-08 LAB — CBC
HCT: 25.9 % — ABNORMAL LOW (ref 36.0–46.0)
Hemoglobin: 8.9 g/dL — ABNORMAL LOW (ref 12.0–15.0)
MCH: 31.8 pg (ref 26.0–34.0)
MCHC: 34.3 g/dL (ref 30.0–36.0)
MCV: 92.5 fL (ref 78.0–100.0)
Platelets: 139 10*3/uL — ABNORMAL LOW (ref 150–400)
RBC: 2.8 MIL/uL — ABNORMAL LOW (ref 3.87–5.11)
RDW: 14.7 % (ref 11.5–15.5)
WBC: 6 10*3/uL (ref 4.0–10.5)

## 2010-05-08 LAB — PROTIME-INR
INR: 1.56 — ABNORMAL HIGH (ref 0.00–1.49)
INR: 1.61 — ABNORMAL HIGH (ref 0.00–1.49)
INR: 1.65 — ABNORMAL HIGH (ref 0.00–1.49)
Prothrombin Time: 18.9 seconds — ABNORMAL HIGH (ref 11.6–15.2)
Prothrombin Time: 19.3 seconds — ABNORMAL HIGH (ref 11.6–15.2)
Prothrombin Time: 19.7 seconds — ABNORMAL HIGH (ref 11.6–15.2)

## 2010-05-09 LAB — PROTIME-INR
INR: 0.98 (ref 0.00–1.49)
INR: 0.98 (ref 0.00–1.49)
INR: 1.16 (ref 0.00–1.49)
Prothrombin Time: 13.2 seconds (ref 11.6–15.2)

## 2010-05-09 LAB — TYPE AND SCREEN
ABO/RH(D): A POS
Antibody Screen: NEGATIVE

## 2010-05-09 LAB — CBC
HCT: 27.1 % — ABNORMAL LOW (ref 36.0–46.0)
Hemoglobin: 12 g/dL (ref 12.0–15.0)
Hemoglobin: 13.1 g/dL (ref 12.0–15.0)
MCH: 31.9 pg (ref 26.0–34.0)
MCHC: 33.5 g/dL (ref 30.0–36.0)
MCHC: 33.9 g/dL (ref 30.0–36.0)
MCHC: 33.9 g/dL (ref 30.0–36.0)
MCHC: 34.3 g/dL (ref 30.0–36.0)
Platelets: 127 10*3/uL — ABNORMAL LOW (ref 150–400)
Platelets: 146 10*3/uL — ABNORMAL LOW (ref 150–400)
Platelets: 163 10*3/uL (ref 150–400)
Platelets: 187 10*3/uL (ref 150–400)
RBC: 4.12 MIL/uL (ref 3.87–5.11)
RDW: 14.8 % (ref 11.5–15.5)
RDW: 14.9 % (ref 11.5–15.5)
RDW: 15 % (ref 11.5–15.5)
WBC: 6.3 10*3/uL (ref 4.0–10.5)
WBC: 6.8 10*3/uL (ref 4.0–10.5)

## 2010-05-09 LAB — BASIC METABOLIC PANEL
BUN: 5 mg/dL — ABNORMAL LOW (ref 6–23)
Calcium: 8.3 mg/dL — ABNORMAL LOW (ref 8.4–10.5)
Calcium: 8.8 mg/dL (ref 8.4–10.5)
Calcium: 9 mg/dL (ref 8.4–10.5)
Creatinine, Ser: 0.55 mg/dL (ref 0.4–1.2)
Creatinine, Ser: 0.59 mg/dL (ref 0.4–1.2)
GFR calc Af Amer: >60 mL/min (ref >60)
GFR calc non Af Amer: >60 mL/min (ref >60)
GFR calc non Af Amer: >60 mL/min (ref >60)
GFR calc non Af Amer: >60 mL/min (ref >60)
Glucose, Bld: 114 mg/dL — ABNORMAL HIGH (ref 70–99)
Glucose, Bld: 152 mg/dL — ABNORMAL HIGH (ref 70–99)
Sodium: 137 mEq/L (ref 135–145)
Sodium: 138 mEq/L (ref 135–145)

## 2010-05-09 LAB — DIFFERENTIAL
Basophils Relative: 0 % (ref 0–1)
Lymphs Abs: 1.4 10*3/uL (ref 0.7–4.0)
Monocytes Relative: 7 % (ref 3–12)
Neutro Abs: 5.8 10*3/uL (ref 1.7–7.7)
Neutrophils Relative %: 74 % (ref 43–77)

## 2010-05-09 LAB — URINE CULTURE

## 2010-05-09 LAB — URINALYSIS, ROUTINE W REFLEX MICROSCOPIC
Bilirubin Urine: NEGATIVE
Hgb urine dipstick: NEGATIVE
Ketones, ur: NEGATIVE mg/dL
Protein, ur: NEGATIVE mg/dL
Urobilinogen, UA: 0.2 mg/dL (ref 0.0–1.0)

## 2010-05-14 ENCOUNTER — Encounter: Payer: Self-pay | Admitting: Internal Medicine

## 2010-05-14 DIAGNOSIS — M810 Age-related osteoporosis without current pathological fracture: Secondary | ICD-10-CM | POA: Insufficient documentation

## 2010-05-15 ENCOUNTER — Ambulatory Visit: Payer: Self-pay | Admitting: Internal Medicine

## 2010-05-15 DIAGNOSIS — Z0289 Encounter for other administrative examinations: Secondary | ICD-10-CM

## 2010-05-29 LAB — DIFFERENTIAL
Basophils Absolute: 0 10*3/uL (ref 0.0–0.1)
Basophils Relative: 0 % (ref 0–1)
Eosinophils Absolute: 0 10*3/uL (ref 0.0–0.7)
Eosinophils Relative: 0 % (ref 0–5)
Lymphocytes Relative: 12 % (ref 12–46)
Monocytes Absolute: 0.5 10*3/uL (ref 0.1–1.0)

## 2010-05-29 LAB — CBC
MCHC: 34.8 g/dL (ref 30.0–36.0)
MCV: 95.3 fL (ref 78.0–100.0)
Platelets: 202 10*3/uL (ref 150–400)
Platelets: 155 10*3/uL (ref 150–400)
RBC: 4.01 MIL/uL (ref 3.87–5.11)
RBC: 4.03 MIL/uL (ref 3.87–5.11)
WBC: 8.6 10*3/uL (ref 4.0–10.5)
WBC: 7.1 10*3/uL (ref 4.0–10.5)

## 2010-05-29 LAB — POCT CARDIAC MARKERS: CKMB, poc: <1 ng/mL — ABNORMAL LOW (ref 1.0–8.0)

## 2010-05-29 LAB — COMPREHENSIVE METABOLIC PANEL
ALT: 30 U/L (ref 0–35)
AST: 31 U/L (ref 0–37)
Albumin: 3.2 g/dL — ABNORMAL LOW (ref 3.5–5.2)
Alkaline Phosphatase: 69 U/L (ref 39–117)
CO2: 23 mEq/L (ref 19–32)
Chloride: 107 mEq/L (ref 96–112)
GFR calc Af Amer: >60 mL/min (ref >60)
GFR calc non Af Amer: 58 mL/min — ABNORMAL LOW (ref >60)
Potassium: 3.9 mEq/L (ref 3.5–5.1)
Total Bilirubin: 0.4 mg/dL (ref 0.3–1.2)

## 2010-05-29 LAB — URINALYSIS, ROUTINE W REFLEX MICROSCOPIC
Bilirubin Urine: NEGATIVE
Hgb urine dipstick: NEGATIVE
Ketones, ur: 15 mg/dL — AB
Specific Gravity, Urine: 1.008 (ref 1.005–1.030)
Urobilinogen, UA: 0.2 mg/dL (ref 0.0–1.0)

## 2010-05-29 LAB — BASIC METABOLIC PANEL
BUN: 11 mg/dL (ref 6–23)
CO2: 27 mEq/L (ref 19–32)
Calcium: 9.2 mg/dL (ref 8.4–10.5)
Chloride: 107 mEq/L (ref 96–112)
Creatinine, Ser: 0.59 mg/dL (ref 0.4–1.2)
GFR calc Af Amer: >60 mL/min (ref >60)

## 2010-05-29 LAB — URINE MICROSCOPIC-ADD ON

## 2010-06-25 ENCOUNTER — Telehealth: Payer: Self-pay | Admitting: *Deleted

## 2010-06-25 NOTE — Telephone Encounter (Signed)
Faxed ins verification req to Prol[employer]. Will wait for benefit summary

## 2010-06-27 NOTE — Telephone Encounter (Signed)
rec fax back from [facility] [facility]. Pt's OOP is $0. She is due for Prolia 08-24-10. Informed pt of this and transferred to scheduler to make appt for 08-21-10. Will order med

## 2010-08-07 ENCOUNTER — Encounter: Payer: Self-pay | Admitting: Internal Medicine

## 2010-08-21 ENCOUNTER — Ambulatory Visit (INDEPENDENT_AMBULATORY_CARE_PROVIDER_SITE_OTHER): Payer: Medicare Other

## 2010-08-21 DIAGNOSIS — M810 Age-related osteoporosis without current pathological fracture: Secondary | ICD-10-CM

## 2010-08-21 MED ORDER — DENOSUMAB 60 MG/ML ~~LOC~~ SOLN: 60.0000 mg | Freq: Once | SUBCUTANEOUS | Status: DC

## 2010-08-21 MED ORDER — DENOSUMAB 60 MG/ML ~~LOC~~ SOLN
60.0000 mg | Freq: Once | SUBCUTANEOUS | Status: AC
  Administered 2010-08-21: 60 mg via SUBCUTANEOUS

## 2010-09-23 ENCOUNTER — Ambulatory Visit (INDEPENDENT_AMBULATORY_CARE_PROVIDER_SITE_OTHER): Payer: Medicare Other | Admitting: Internal Medicine

## 2010-09-23 ENCOUNTER — Encounter: Payer: Self-pay | Admitting: Internal Medicine

## 2010-09-23 DIAGNOSIS — I10 Essential (primary) hypertension: Secondary | ICD-10-CM

## 2010-09-23 DIAGNOSIS — N3946 Mixed incontinence: Secondary | ICD-10-CM

## 2010-09-23 DIAGNOSIS — J449 Chronic obstructive pulmonary disease, unspecified: Secondary | ICD-10-CM

## 2010-09-23 MED ORDER — FESOTERODINE FUMARATE ER 4 MG PO TB24: 4.0000 mg | ORAL_TABLET | Freq: Every day | ORAL | Status: DC

## 2010-09-23 NOTE — Assessment & Plan Note (Signed)
BP Readings from Last 3 Encounters:  09/23/10 148/82  02/11/10 122/70  06/29/09 140/70   Usually well controlled with current ARB - continue same

## 2010-09-23 NOTE — Patient Instructions (Signed)
It was good to see you today. Medications reviewed, samples given Start toviaz for bladder symptoms as discussed - samples given and your prescription(s) have been submitted to your pharmacy. Please take as directed and contact our office if you believe you are having problem(s) with the medication(s). If not improved, call and we will refer to urology for other evaluation and treatment Please schedule followup in 6 months for blood pressure and cholesterol check, call sooner if problems. Work on giving up those last cigarettes!

## 2010-09-23 NOTE — Assessment & Plan Note (Signed)
Follows with pulm as needed - continue symbicort - symptoms well controlled today Advised of importance of smoking cessation

## 2010-09-23 NOTE — Assessment & Plan Note (Signed)
Remote bladder sling - most hx appears urge related Start toviaz or other medication for same prior to refer to uro

## 2010-09-23 NOTE — Progress Notes (Signed)
  Subjective:    Patient ID: Karina Andrews, female    DOB: Dec 08, 1930, 77 y.o.   MRN: [id number]  HPI  here for follow up - reviewed chronic medical issues:  osteoporosis - hx accidental fall and R hip fx 11/2009  stopped alendronate weekly due to cost and "forgot" to take  hx prior L hip fx 2006 s/p accidental fall - started prolia - 1st injection 07/2010  HTN - reports compliance with ongoing medical treatment and no changes in medication dose or frequency. denies adverse side effects related to current therapy.   COPD -in process of quitting smoking  had chronic cough that "has completely resolved" since starting symbicort   depression and insomnia -  no longer on meds (tried cymbalta 06/2009) -  use generic ambein as needed since snf stay for hip rehab 12/2009  denies sadness or tearfulness  Past Medical History  Diagnosis Date  . Hypertension   . Depression   . Osteoporosis     (R) hip fx 11/2009 & (L) hip fx 2006  . COPD (chronic obstructive pulmonary disease)   . Cervical spondylosis with myelopathy 01/2009    s/p decompression    Review of Systems  Respiratory: Negative for shortness of breath and wheezing.   Cardiovascular: Negative for chest pain.  Genitourinary: Positive for urgency (increasing urge incont).  Neurological: Negative for headaches.      Objective:   Physical Exam BP 148/82  Pulse 105  Temp(Src) 97.8 F (36.6 C) (Oral)  Ht 5\' 4"  (1.626 m)  Wt 140 lb 6.4 oz (63.685 kg)  BMI 24.10 kg/m2  SpO2 95%  Constitutional: She is oriented to person, place, and time. She appears well-developed and well-nourished. No distress. Neck: Normal range of motion. Neck supple. No JVD present. No thyromegaly present.  Cardiovascular: Normal rate, regular rhythm and normal heart sounds.  No murmur heard. No BLE edema. Pulmonary/Chest: Effort normal and breath sounds normal. No respiratory distress. She has no wheezes. Psychiatric: She has a normal mood and  affect. Her behavior is normal. Judgment and thought content normal.   Lab Results  Component Value Date   WBC 6.0 12/08/2009   HGB 8.9* 12/08/2009   HCT 25.9* 12/08/2009   PLT 139* 12/08/2009   CHOL 295* 12/12/2008   TRIG 95.0 12/12/2008   HDL 85.60 12/12/2008   LDLDIRECT 189.2 12/12/2008   ALT 30 01/02/2009   AST 31 01/02/2009   NA 141 12/06/2009   K 4.5 12/06/2009   CL 106 12/06/2009   CREATININE 0.55 12/06/2009   BUN 5* 12/06/2009   CO2 29 12/06/2009   TSH 1.35 06/29/2009   INR 1.65* 12/10/2009       Assessment & Plan:   See problem list. Medications and labs reviewed today.

## 2011-01-21 ENCOUNTER — Other Ambulatory Visit: Payer: Self-pay | Admitting: Internal Medicine

## 2011-01-24 ENCOUNTER — Ambulatory Visit (INDEPENDENT_AMBULATORY_CARE_PROVIDER_SITE_OTHER)
Admission: RE | Admit: 2011-01-24 | Discharge: 2011-01-24 | Disposition: A | Discharge status: Home / Self Care | Payer: Medicare Other | Source: Ambulatory Visit | Attending: Internal Medicine | Admitting: Internal Medicine

## 2011-01-24 ENCOUNTER — Ambulatory Visit (INDEPENDENT_AMBULATORY_CARE_PROVIDER_SITE_OTHER): Payer: Medicare Other | Admitting: Internal Medicine

## 2011-01-24 ENCOUNTER — Encounter: Payer: Self-pay | Admitting: Internal Medicine

## 2011-01-24 VITALS — BP 104/64 | HR 112 | Temp 97.4°F | Resp 20 | Wt 138.0 lb

## 2011-01-24 DIAGNOSIS — R0902 Hypoxemia: Secondary | ICD-10-CM

## 2011-01-24 DIAGNOSIS — J441 Chronic obstructive pulmonary disease with (acute) exacerbation: Secondary | ICD-10-CM

## 2011-01-24 DIAGNOSIS — L309 Dermatitis, unspecified: Secondary | ICD-10-CM

## 2011-01-24 DIAGNOSIS — L259 Unspecified contact dermatitis, unspecified cause: Secondary | ICD-10-CM

## 2011-01-24 MED ORDER — ALBUTEROL SULFATE HFA 108 (90 BASE) MCG/ACT IN AERS: 2.0000 | INHALATION_SPRAY | Freq: Four times a day (QID) | RESPIRATORY_TRACT | Status: DC | PRN

## 2011-01-24 MED ORDER — LEVOFLOXACIN 500 MG PO TABS: 500.0000 mg | ORAL_TABLET | Freq: Every day | ORAL | Status: DC

## 2011-01-24 MED ORDER — HALOBETASOL PROPIONATE 0.05 % EX CREA: TOPICAL_CREAM | Freq: Two times a day (BID) | CUTANEOUS | Status: DC

## 2011-01-24 MED ORDER — HYDROCODONE-HOMATROPINE 5-1.5 MG/5ML PO SYRP: 5.0000 mL | ORAL_SOLUTION | Freq: Four times a day (QID) | ORAL | Status: AC | PRN

## 2011-01-24 MED ORDER — PREDNISONE (PAK) 10 MG PO TABS: 10.0000 mg | ORAL_TABLET | ORAL | Status: DC

## 2011-01-24 NOTE — Patient Instructions (Signed)
It was good to see you today. Chest xray ordered today. Your results will be called to you after review (48-72hours after test completion). If any changes need to be made, you will be notified at that time. Levaquin antibiotics, Prednisone pak x 6 days, Hydromet cough syrup and Albuterol inhaler as needed for cough/shortness of breath or wheeze - Your prescription(s) have been submitted to your pharmacy. Please take as directed and contact our office if you believe you are having problem(s) with the medication(s). Refill on steroid cream for rash

## 2011-01-24 NOTE — Progress Notes (Signed)
  Subjective:    Patient ID: Karina Andrews, female    DOB: Oct 21, 1930, 77 y.o.   MRN: [id number]  HPI  Here for cough Onset > 7 days ago associated with shortness of breath, wheeze -  symptoms worse at night - not improved with usual inhalers Denies fever, night sweats No change in chronic sputum quality>foamy white or grey    Past Medical History  Diagnosis Date  . Hypertension   . Depression   . Osteoporosis     (R) hip fx 11/2009 & (L) hip fx 2006  . COPD (chronic obstructive pulmonary disease)   . Cervical spondylosis with myelopathy 01/2009    s/p decompression    Review of Systems General: No unexpected weight change, no change in appetite or activity Respiratory: See above. No hemoptysis Cardiovascular: No chest pain or palpitations    Objective:   Physical Exam BP 104/64  Pulse 112  Temp(Src) 97.4 F (36.3 C) (Oral)  Resp 20  Wt 138 lb (62.596 kg)  SpO2 90% Wt Readings from Last 3 Encounters:  01/24/11 138 lb (62.596 kg)  09/23/10 140 lb 6.4 oz (63.685 kg)  02/11/10 139 lb 1.9 oz (63.104 kg)   Constitutional: She appears fatigued, mild-mod ill with cough spasms - Dtr at side  HENT: Head: Normocephalic and atraumatic. Ears: B TMs ok, no erythema or effusion; Nose: rhinnorhea.  Mouth/Throat: Oropharynx is mild-mod red but clear and moist. No oropharyngeal exudate.  Eyes: Conjunctivae and EOM are normal. Pupils are equal, round, and reactive to light. No scleral icterus. Cardiovascular: Normal rate, regular rhythm and normal heart sounds.  No murmur heard. No BLE edema. Pulmonary/Chest: Effort normal and breath sounds diminished B bases - no crackles.  She has end exp wheezes. Neurological: She is alert and oriented to person, place, and time. No cranial nerve deficit. Coordination normal.  Skin: psoriasis like change L>R post auricular area, periorbital and perioral       Assessment & Plan:   COPD exacerbation> cough and hypoxia > r/o PNA symptoms  ongoing > 1 week  Check CXR Empiric antibiotics  Steroids for bronchospasm, add Alb MDI prn to symbicort (just resumed same in past 48h) hydromet cough syrup  eczema vs psoriasis - renew steroid cream as requested

## 2011-01-28 ENCOUNTER — Other Ambulatory Visit: Payer: Self-pay | Admitting: Internal Medicine

## 2011-02-01 ENCOUNTER — Other Ambulatory Visit: Payer: Self-pay | Admitting: Internal Medicine

## 2011-02-07 ENCOUNTER — Ambulatory Visit: Payer: Medicare Other | Admitting: Endocrinology

## 2011-02-14 ENCOUNTER — Ambulatory Visit (INDEPENDENT_AMBULATORY_CARE_PROVIDER_SITE_OTHER): Payer: Medicare Other | Admitting: Internal Medicine

## 2011-02-14 ENCOUNTER — Encounter: Payer: Self-pay | Admitting: Internal Medicine

## 2011-02-14 VITALS — BP 112/62 | HR 102 | Temp 97.6°F

## 2011-02-14 DIAGNOSIS — J449 Chronic obstructive pulmonary disease, unspecified: Secondary | ICD-10-CM

## 2011-02-14 MED ORDER — BUDESONIDE-FORMOTEROL FUMARATE 160-4.5 MCG/ACT IN AERO: 2.0000 | INHALATION_SPRAY | Freq: Two times a day (BID) | RESPIRATORY_TRACT | Status: DC

## 2011-02-14 MED ORDER — ROFLUMILAST 500 MCG PO TABS: 500.0000 ug | ORAL_TABLET | Freq: Every day | ORAL | Status: DC

## 2011-02-14 NOTE — Assessment & Plan Note (Signed)
Follows with pulm as needed -  continue symbicort, prn Alb MDI and start Daliresp - erx done Advised of importance of smoking cessation

## 2011-02-14 NOTE — Patient Instructions (Signed)
It was good to see you today. Start Daliresp daily pill in addition to Symbicort daily and Albuterol inhaler as needed for cough/shortness of breath or wheeze -  Your prescription(s) and refills have been submitted to your pharmacy. Please take as directed and contact our office if you believe you are having problem(s) with the medication(s).

## 2011-02-14 NOTE — Progress Notes (Signed)
  Subjective:    Patient ID: Karina Andrews, female    DOB: 03/05/1930, 77 y.o.   MRN: [id number]  HPI  Here for cough Onset 7 days ago associated with shortness of breath, wheeze -  symptoms worse at night - improved in last 48h with usual inhalers Denies fever, night sweats No change in chronic sputum quality>foamy white or grey Precipitated by sick contacts    Past Medical History  Diagnosis Date  . Hypertension   . Depression   . Osteoporosis     (R) hip fx 11/2009 & (L) hip fx 2006  . COPD (chronic obstructive pulmonary disease)   . Cervical spondylosis with myelopathy 01/2009    s/p decompression    Review of Systems General: No unexpected weight change, no change in appetite or activity Respiratory: See above. No hemoptysis Cardiovascular: No chest pain or palpitations    Objective:   Physical Exam  BP 112/62  Pulse 102  Temp(Src) 97.6 F (36.4 C) (Oral)  SpO2 96% Wt Readings from Last 3 Encounters:  01/24/11 138 lb (62.596 kg)  09/23/10 140 lb 6.4 oz (63.685 kg)  02/11/10 139 lb 1.9 oz (63.104 kg)   Constitutional: She appears fatigued but nontoxic - Dtr at side  HENT: Head: Normocephalic and atraumatic. Ears: B TMs ok, no erythema or effusion; Nose: rhinnorhea.  Mouth/Throat: Oropharynx is mild-mod red but clear and moist. No oropharyngeal exudate.  Eyes: Conjunctivae and EOM are normal. Pupils are equal, round, and reactive to light. No scleral icterus. Cardiovascular: Normal rate, regular rhythm and normal heart sounds.  No murmur heard. No BLE edema. Pulmonary/Chest: Effort normal and breath sounds diminished B bases - no crackles.  She has no exp wheezes. Neurological: She is alert and oriented to person, place, and time. No cranial nerve deficit. Coordination normal.  Skin: psoriasis like change L>R post auricular area, periorbital and perioral       Assessment & Plan:   COPD > chronic bronchitis symptoms, no acute exac at this time CXR  01/14/11 reviewed - no acute dz   Start Daliresp, continue Alb MDI prn with symbicort

## 2011-02-28 ENCOUNTER — Other Ambulatory Visit: Payer: Self-pay | Admitting: Internal Medicine

## 2011-03-24 ENCOUNTER — Ambulatory Visit: Payer: Medicare Other | Admitting: Internal Medicine

## 2011-06-24 ENCOUNTER — Telehealth: Payer: Self-pay | Admitting: *Deleted

## 2011-06-24 DIAGNOSIS — R239 Unspecified skin changes: Secondary | ICD-10-CM

## 2011-06-24 NOTE — Telephone Encounter (Signed)
Left msg on vm requesting md to recommend a good dermatologist....06/24/11@4 :32pm/LMB

## 2011-06-25 NOTE — Telephone Encounter (Signed)
[facility] or [place] [facility] - order done

## 2011-06-25 NOTE — Telephone Encounter (Signed)
Called pt no answer LMOM md response. Will received call back from [facility] with appt, date and time...06/25/11@10 :21am/LMB

## 2011-07-09 ENCOUNTER — Other Ambulatory Visit: Payer: Self-pay | Admitting: Internal Medicine

## 2011-09-30 ENCOUNTER — Other Ambulatory Visit: Payer: Self-pay | Admitting: *Deleted

## 2011-09-30 MED ORDER — OLMESARTAN MEDOXOMIL 20 MG PO TABS: 20.0000 mg | ORAL_TABLET | Freq: Every day | ORAL | Status: DC

## 2011-11-03 ENCOUNTER — Ambulatory Visit (INDEPENDENT_AMBULATORY_CARE_PROVIDER_SITE_OTHER): Payer: Medicare Other | Admitting: Internal Medicine

## 2011-11-03 ENCOUNTER — Encounter: Payer: Self-pay | Admitting: Internal Medicine

## 2011-11-03 ENCOUNTER — Other Ambulatory Visit (INDEPENDENT_AMBULATORY_CARE_PROVIDER_SITE_OTHER): Payer: Medicare Other

## 2011-11-03 VITALS — BP 124/68 | HR 95 | Temp 97.4°F | Ht 64.0 in | Wt 143.6 lb

## 2011-11-03 DIAGNOSIS — G629 Polyneuropathy, unspecified: Secondary | ICD-10-CM

## 2011-11-03 DIAGNOSIS — Z23 Encounter for immunization: Secondary | ICD-10-CM

## 2011-11-03 DIAGNOSIS — G609 Hereditary and idiopathic neuropathy, unspecified: Secondary | ICD-10-CM

## 2011-11-03 DIAGNOSIS — H9190 Unspecified hearing loss, unspecified ear: Secondary | ICD-10-CM

## 2011-11-03 DIAGNOSIS — R5383 Other fatigue: Secondary | ICD-10-CM

## 2011-11-03 DIAGNOSIS — W19XXXA Unspecified fall, initial encounter: Secondary | ICD-10-CM

## 2011-11-03 DIAGNOSIS — Y92009 Unspecified place in unspecified non-institutional (private) residence as the place of occurrence of the external cause: Secondary | ICD-10-CM

## 2011-11-03 LAB — BASIC METABOLIC PANEL WITH GFR
BUN: 21 mg/dL (ref 6–23)
CO2: 27 meq/L (ref 19–32)
Calcium: 10.2 mg/dL (ref 8.4–10.5)
Chloride: 101 meq/L (ref 96–112)
Creatinine, Ser: 0.6 mg/dL (ref 0.4–1.2)
GFR: 104.36 mL/min
Glucose, Bld: 93 mg/dL (ref 70–99)
Potassium: 5.7 meq/L — ABNORMAL HIGH (ref 3.5–5.1)
Sodium: 139 meq/L (ref 135–145)

## 2011-11-03 LAB — CBC WITH DIFFERENTIAL/PLATELET
Eosinophils Relative: 2.9 % (ref 0.0–5.0)
HCT: 41.9 % (ref 36.0–46.0)
Hemoglobin: 13.9 g/dL (ref 12.0–15.0)
Lymphocytes Relative: 23.1 % (ref 12.0–46.0)
Lymphs Abs: 1.8 10*3/uL (ref 0.7–4.0)
Monocytes Relative: 9.6 % (ref 3.0–12.0)
Neutro Abs: 5 10*3/uL (ref 1.4–7.7)
Platelets: 184 10*3/uL (ref 150.0–400.0)
WBC: 7.8 10*3/uL (ref 4.5–10.5)

## 2011-11-03 LAB — HEPATIC FUNCTION PANEL
ALT: 20 U/L (ref 0–35)
AST: 19 U/L (ref 0–37)
Albumin: 4.2 g/dL (ref 3.5–5.2)
Alkaline Phosphatase: 87 U/L (ref 39–117)
Bilirubin, Direct: 0 mg/dL (ref 0.0–0.3)
Total Bilirubin: 0.4 mg/dL (ref 0.3–1.2)
Total Protein: 8 g/dL (ref 6.0–8.3)

## 2011-11-03 LAB — TSH: TSH: 1.95 u[IU]/mL (ref 0.35–5.50)

## 2011-11-03 LAB — VITAMIN B12: Vitamin B-12: 984 pg/mL — ABNORMAL HIGH (ref 211–911)

## 2011-11-03 MED ORDER — HYDROCODONE-ACETAMINOPHEN 5-325 MG PO TABS: 1.0000 | ORAL_TABLET | Freq: Three times a day (TID) | ORAL | Status: DC | PRN

## 2011-11-03 NOTE — Patient Instructions (Signed)
It was good to see you today. We have reviewed your prior records including labs and tests today Test(s) ordered today. Your results will be called to you after review (48-72hours after test completion). If any changes need to be made, you will be notified at that time. we'll make referral to physical therapy and for hearing evaluation . Our office will contact you regarding appointment(s) once made. Medications reviewed, no changes at this time. Refill on medication(s) as discussed today. Please schedule followup in 6 months, call sooner if problems.

## 2011-11-03 NOTE — Progress Notes (Signed)
  Subjective:    Patient ID: Karina Andrews, female    DOB: 26-Mar-1930, 78 y.o.   MRN: [id number]  HPI   Here for follow up - accidental fall last week while taking out trash Landed on bottom, no head trauma Able to get up independently, mild back ache - improved with family member's norco prn - ?refill her own as post op (remote) denies injury No syncope   Past Medical History  Diagnosis Date  . Hypertension   . Depression   . Osteoporosis     (R) hip fx 11/2009 & (L) hip fx 2006  . COPD (chronic obstructive pulmonary disease)   . Cervical spondylosis with myelopathy 01/2009    s/p decompression    Review of Systems  General: No unexpected weight change, no change in appetite or activity Respiratory: chronic dyspnea on exertion - No hemoptysis Cardiovascular: No chest pain or palpitations    Objective:   Physical Exam  BP 124/68  Pulse 95  Temp 97.4 F (36.3 C) (Oral)  Ht 5\' 4"  (1.626 m)  Wt 143 lb 9.6 oz (65.137 kg)  BMI 24.65 kg/m2  SpO2 92% Wt Readings from Last 3 Encounters:  11/03/11 143 lb 9.6 oz (65.137 kg)  01/24/11 138 lb (62.596 kg)  09/23/10 140 lb 6.4 oz (63.685 kg)   Constitutional: She appears fatigued but nontoxic - Dtr at side  HENT: Head: Normocephalic and atraumatic. Ears: B TMs ok, no erythema or effusion; Nose: rhinnorhea.  Mouth/Throat: Oropharynx is mild-mod red but clear and moist. No oropharyngeal exudate.  Eyes: Conjunctivae and EOM are normal. Pupils are equal, round, and reactive to light. No scleral icterus. Cardiovascular: Normal rate, regular rhythm and normal heart sounds.  No murmur heard. No BLE edema. Pulmonary/Chest: Effort normal and breath sounds diminished B bases - no crackles.  She has no exp wheezes. MSkel: Back: full range of motion of thoracic and lumbar spine. Non tender to palpation. DTR's are symmetrically intact. Sensation intact in all dermatomes of the lower extremities. Full strength to manual muscle testing.  ambulates with chronically myelopathic, wide based and antalgic gait. Neurological: She is alert and oriented to person, place, and time. No cranial nerve deficit. Coordination normal.  Skin: no bruising - red psoriasis like change L>R post auricular area, periorbital and perioral  Lab Results  Component Value Date   WBC 6.0 12/08/2009   HGB 8.9* 12/08/2009   HCT 25.9* 12/08/2009   PLT 139* 12/08/2009   GLUCOSE 152* 12/06/2009   CHOL 295* 12/12/2008   TRIG 95.0 12/12/2008   HDL 85.60 12/12/2008   LDLDIRECT 189.2 12/12/2008   ALT 30 01/02/2009   AST 31 01/02/2009   NA 141 12/06/2009   K 4.5 12/06/2009   CL 106 12/06/2009   CREATININE 0.55 12/06/2009   BUN 5* 12/06/2009   CO2 29 12/06/2009   TSH 1.35 06/29/2009   INR 1.65* 12/10/2009   Lab Results  Component Value Date   VITAMINB12 348 12/12/2008       Assessment & Plan:   See problem list. Medications and labs reviewed today.  Fatigue - suspect multifactorial - check screening labs  Fall, reports accidental - note hx complicated cervical myelopathy and EtOH use - ?other peripheral neuropathy - check labs and refer for PT   Chronic HOH - refer to audiology per request

## 2011-11-04 ENCOUNTER — Telehealth: Payer: Self-pay | Admitting: Internal Medicine

## 2011-11-04 NOTE — Telephone Encounter (Signed)
Caller: Kristina/Patient; Phone: [phone]; Reason for Call: Foye Clock would like to speak with Lucy regarding patients high potassium levels.  Foye Clock spoke with Valentina Gu yesterday 11/03/11.

## 2011-11-10 NOTE — Telephone Encounter (Signed)
LMOVM for Pt to call back.  

## 2011-12-26 ENCOUNTER — Telehealth: Payer: Self-pay | Admitting: *Deleted

## 2011-12-26 MED ORDER — LOSARTAN POTASSIUM 50 MG PO TABS: 50.0000 mg | ORAL_TABLET | Freq: Every day | ORAL | Status: DC

## 2011-12-26 NOTE — Telephone Encounter (Signed)
Ok done

## 2011-12-26 NOTE — Telephone Encounter (Signed)
R'cd fax from [facility] to change Benicar to Losartan if possible. Losartan is cheaper per her insurance company-please advise.

## 2012-01-16 ENCOUNTER — Telehealth: Payer: Self-pay | Admitting: Internal Medicine

## 2012-01-16 MED ORDER — MELOXICAM 7.5 MG PO TABS: 7.5000 mg | ORAL_TABLET | Freq: Every day | ORAL | Status: DC

## 2012-01-16 MED ORDER — METHOCARBAMOL 500 MG PO TABS: 500.0000 mg | ORAL_TABLET | Freq: Four times a day (QID) | ORAL | Status: DC | PRN

## 2012-01-16 NOTE — Telephone Encounter (Signed)
Patient Information:  Caller Name: Trula Ore  Phone: [phone]  Patient: Karina Andrews, Karina Andrews  Gender: Female  DOB: 05/05/1930  Age: 78 Years  PCP: Rene Paci (Adults only)   Symptoms  Reason For Call & Symptoms: Daughter is calling in about her Mother.  She states her mother has a chronic pain in neck .  She has had worsening for 2 days  Pain with turning head to the right (slight pain).  Daughter has made an appointment for Monday  11/04/01/11 but wants to give her something for pain relief. She currently has "Diazepam 5mg  and Meloxicam" that are (the daughters medications -Rx by [employer]).  Daughter wants to know can she give these to her mother for pain relief.  Mother does have Hydrocodone at home .  Reviewed Health History In EMR: Yes  Reviewed Medications In EMR: Yes  Reviewed Allergies In EMR: Yes  Date of Onset of Symptoms: 12/15/2011  Treatments Tried: Hot compresses , and ASA  Treatments Tried Worked: No  Guideline(s) Used:  Neck Pain or Stiffness  Disposition Per Guideline:   See Within 3 Days in Office  Reason For Disposition Reached:   Moderate neck pain (e.g., interferes with normal activities like work or school)  Advice Given:  Apply Cold to the Area Or Heat:   During the first 2 days after a mild injury, apply a cold pack or an ice bag (wrapped in a towel) for 20 minutes four times a day. After 2 days, apply a heating pad or hot water bottle to the most painful area for 20 minutes whenever the pain flares up. Wrap hot water bottles or heating pads in a towel to avoid burns.  Sleep:  Sleep on your back or side, not on your abdomen.  Sleep with a neck collar. Use a foam neck collar (from a pharmacy) OR a small towel wrapped around the neck (Reason: keep the head from moving too much during sleep).  Pain Medicines:  For pain relief, you can take either acetaminophen, ibuprofen, or naproxen.  They are over-the-counter (OTC) pain drugs. You can buy them at the  drugstore.  Call Back If:  Numbness or weakness occurs  Pain lasts for more than 2 weeks  You become worse.  Good Body Mechanics:  Sleeping: Sleep on a firm mattress.  Sitting: Avoid sitting for long periods of time without a break. Avoid slouching. Place a pillow or towel behind your lower back for support.  Posture: Maintain good posture.  Office Follow Up:  Does the office need to follow up with this patient?: Yes  Instructions For The Office: Daughter wants approval to give her mother HER (THE DAUGHTER'S) MEDICATION for neck pain. Please contact daughter.  RN Note:  Advised Daughter not to give mother her medications until reviewed by Dr. Alberteen Sam . Daughter states they have done this before. Encouraged home treatment for now and will forward over request as well.  Her mother does have Hydrocodone medication at home

## 2012-01-16 NOTE — Telephone Encounter (Signed)
Notified pt daughter Foye Clock) md response...Raechel Chute

## 2012-01-16 NOTE — Telephone Encounter (Signed)
I have sent pt her own meloxicam 7.5mg  daily and robaxin for muscle spasm to use for pain pending visit - she should use these instead of dtr's meds - thanks

## 2012-01-19 ENCOUNTER — Ambulatory Visit: Payer: Medicare Other | Admitting: Internal Medicine

## 2012-04-08 ENCOUNTER — Other Ambulatory Visit: Payer: Self-pay | Admitting: Internal Medicine

## 2012-04-10 NOTE — Telephone Encounter (Signed)
Advise patient no additional refills without being seen by Dr. Felicity Coyer.

## 2012-04-20 ENCOUNTER — Ambulatory Visit (INDEPENDENT_AMBULATORY_CARE_PROVIDER_SITE_OTHER)
Admission: RE | Admit: 2012-04-20 | Discharge: 2012-04-20 | Disposition: A | Discharge status: Home / Self Care | Payer: Medicare Other | Source: Ambulatory Visit | Attending: Internal Medicine | Admitting: Internal Medicine

## 2012-04-20 ENCOUNTER — Encounter: Payer: Self-pay | Admitting: Internal Medicine

## 2012-04-20 ENCOUNTER — Ambulatory Visit (INDEPENDENT_AMBULATORY_CARE_PROVIDER_SITE_OTHER): Payer: Medicare Other | Admitting: Internal Medicine

## 2012-04-20 VITALS — BP 122/72 | HR 110 | Temp 97.0°F | Wt 137.8 lb

## 2012-04-20 DIAGNOSIS — J441 Chronic obstructive pulmonary disease with (acute) exacerbation: Secondary | ICD-10-CM

## 2012-04-20 DIAGNOSIS — I10 Essential (primary) hypertension: Secondary | ICD-10-CM

## 2012-04-20 DIAGNOSIS — M810 Age-related osteoporosis without current pathological fracture: Secondary | ICD-10-CM

## 2012-04-20 DIAGNOSIS — R042 Hemoptysis: Secondary | ICD-10-CM

## 2012-04-20 MED ORDER — ALBUTEROL SULFATE HFA 108 (90 BASE) MCG/ACT IN AERS: 2.0000 | INHALATION_SPRAY | Freq: Four times a day (QID) | RESPIRATORY_TRACT | Status: DC | PRN

## 2012-04-20 MED ORDER — LEVOFLOXACIN 500 MG PO TABS: 500.0000 mg | ORAL_TABLET | Freq: Every day | ORAL | Status: AC

## 2012-04-20 MED ORDER — LOSARTAN POTASSIUM 50 MG PO TABS: 50.0000 mg | ORAL_TABLET | Freq: Every day | ORAL | Status: DC

## 2012-04-20 MED ORDER — BUDESONIDE-FORMOTEROL FUMARATE 160-4.5 MCG/ACT IN AERO: 2.0000 | INHALATION_SPRAY | Freq: Two times a day (BID) | RESPIRATORY_TRACT | Status: DC

## 2012-04-20 MED ORDER — PREDNISONE (PAK) 10 MG PO TABS: 10.0000 mg | ORAL_TABLET | ORAL | Status: AC

## 2012-04-20 NOTE — Assessment & Plan Note (Signed)
History of prior fracture reviewed Prescribed prolia treatment in past, last injection in June 2012 Resumed treatment now, nurse schedule recommended

## 2012-04-20 NOTE — Progress Notes (Signed)
  Subjective:    Patient ID: Karina Andrews, female    DOB: 1930-04-29, 79 y.o.   MRN: [id number]  Influenza Associated symptoms include congestion, coughing and fatigue. Pertinent negatives include no fever.   Here for cough -chronic, but increased symptoms in past weeks associated with trace blood tinge sputum x 2 episodes Needs refill on Symbicort - ?ok for [place] med order   Past Medical History  Diagnosis Date  . Hypertension   . Depression   . Osteoporosis     (R) hip fx 11/2009 & (L) hip fx 2006  . COPD (chronic obstructive pulmonary disease)   . Cervical spondylosis with myelopathy 01/2009    s/p decompression    Review of Systems  Constitutional: Positive for fatigue. Negative for fever and unexpected weight change.  HENT: Positive for congestion. Negative for postnasal drip.   Respiratory: Positive for cough and shortness of breath. Negative for wheezing.   Neurological: Positive for light-headedness.       Objective:   Physical Exam  BP 122/72  Pulse 110  Temp(Src) 97 F (36.1 C) (Oral)  Wt 137 lb 12.8 oz (62.506 kg)  BMI 23.64 kg/m2  SpO2 91% Wt Readings from Last 3 Encounters:  04/20/12 137 lb 12.8 oz (62.506 kg)  11/03/11 143 lb 9.6 oz (65.137 kg)  01/24/11 138 lb (62.596 kg)   Constitutional: She appears fatigued but nontoxic - Dtr at side  HENT: Head: Normocephalic and atraumatic. Ears: B TMs ok, no erythema or effusion; Nose: rhinnorhea.  Mouth/Throat: Oropharynx is mild-mod red but clear and moist. No oropharyngeal exudate.  Eyes: Conjunctivae and EOM are normal. Pupils are equal, round, and reactive to light. No scleral icterus. Cardiovascular: Normal rate, regular rhythm and normal heart sounds.  No murmur heard. No BLE edema. Pulmonary/Chest: Effort normal and breath sounds diminished B bases - no crackles.  She has no exp wheezes. MSkel: Back: full range of motion of thoracic and lumbar spine. Non tender to palpation. DTR's are symmetrically  intact. Sensation intact in all dermatomes of the lower extremities. Full strength to manual muscle testing. ambulates with chronically myelopathic, wide based and antalgic gait, using walker for balance. Neurological: She is alert and oriented to person, place, and time. No cranial nerve deficit. Coordination normal.    Lab Results  Component Value Date   WBC 7.8 11/03/2011   HGB 13.9 11/03/2011   HCT 41.9 11/03/2011   PLT 184.0 11/03/2011   GLUCOSE 93 11/03/2011   CHOL 295* 12/12/2008   TRIG 95.0 12/12/2008   HDL 85.60 12/12/2008   LDLDIRECT 189.2 12/12/2008   ALT 20 11/03/2011   AST 19 11/03/2011   NA 139 11/03/2011   K 5.7* 11/03/2011   CL 101 11/03/2011   CREATININE 0.6 11/03/2011   BUN 21 11/03/2011   CO2 27 11/03/2011   TSH 1.95 11/03/2011   INR 1.65* 12/10/2009   Lab Results  Component Value Date   VITAMINB12 984* 11/03/2011       Assessment & Plan:  Acute COPD exacerbation atop chronic bronchitis -check plain film because of reported hemoptysis. Treat with Levaquin and prednisone taper Remove Symbicort and rescue inhaler, okay for [place] prescription if available  Also see problem list. Medications and labs reviewed today.

## 2012-04-20 NOTE — Assessment & Plan Note (Signed)
BP Readings from Last 3 Encounters:  04/20/12 122/72  11/03/11 124/68  02/14/11 112/62   Usually well controlled with current ARB -  Changed from Benicar to generic losartan per formulary The current medical regimen is effective;  continue present plan and medications.

## 2012-04-20 NOTE — Patient Instructions (Signed)
It was good to see you today. Test(s) ordered today. Your results will be released to MyChart (or called to you) after review, usually within 72hours after test completion. If any changes need to be made, you will be notified at that same time. Take Levaquin antibiotics for the next 7 days and prednisone taper for the next 6 days to help control cough and recent bronchitis symptoms - Your prescription(s) have been submitted to your pharmacy. Please take as directed and contact our office if you believe you are having problem(s) with the medication(s). Your Symbicort and albuterol have been submitted to your local pharmacy as well as printed for you to send to mail order as requested We'll schedule for Prolia injection for your bones as discussed - continue these injections every 6 months Please schedule followup in 3-6 months, call sooner if problems.

## 2012-05-04 ENCOUNTER — Telehealth: Payer: Self-pay | Admitting: *Deleted

## 2012-05-04 MED ORDER — ZOSTER VACCINE LIVE 19400 UNT/0.65ML ~~LOC~~ SOLR: 0.6500 mL | Freq: Once | SUBCUTANEOUS | Status: DC

## 2012-05-04 NOTE — Telephone Encounter (Signed)
Generated rx md already left. Gave to regina to sign. Notified pt rx ready for pick-up.....Raechel Chute

## 2012-05-04 NOTE — Telephone Encounter (Signed)
Good - ok to generate rx for zostavax and i will sign

## 2012-05-04 NOTE — Telephone Encounter (Signed)
Pt requesting rx for shingles to take to pharmacy, and to inform they she was approve for prolia will be coming in soon to get injection...Raechel Chute

## 2012-05-10 ENCOUNTER — Other Ambulatory Visit: Payer: Self-pay | Admitting: *Deleted

## 2012-05-10 NOTE — Telephone Encounter (Signed)
Received fax stating wanting rx for zostavax rx fax over. Faxed script to [facility]...Raechel Chute

## 2012-05-11 ENCOUNTER — Ambulatory Visit (INDEPENDENT_AMBULATORY_CARE_PROVIDER_SITE_OTHER): Payer: Medicare Other | Admitting: *Deleted

## 2012-05-11 DIAGNOSIS — M810 Age-related osteoporosis without current pathological fracture: Secondary | ICD-10-CM

## 2012-05-11 MED ORDER — DENOSUMAB 60 MG/ML ~~LOC~~ SOLN
60.0000 mg | Freq: Once | SUBCUTANEOUS | Status: AC
  Administered 2012-05-11: 60 mg via SUBCUTANEOUS

## 2012-05-19 ENCOUNTER — Telehealth: Payer: Self-pay | Admitting: *Deleted

## 2012-05-19 DIAGNOSIS — Z1239 Encounter for other screening for malignant neoplasm of breast: Secondary | ICD-10-CM

## 2012-05-19 NOTE — Telephone Encounter (Signed)
Pt made aware will be contacted by [facility] with appt. Date & time.Marland KitchenRaechel Chute

## 2012-05-19 NOTE — Telephone Encounter (Signed)
Ok order done 

## 2012-05-19 NOTE — Telephone Encounter (Signed)
Pt is wanting to get a mammogram.../lmb

## 2012-06-18 ENCOUNTER — Ambulatory Visit
Admission: RE | Admit: 2012-06-18 | Discharge: 2012-06-18 | Disposition: A | Discharge status: Home / Self Care | Payer: Medicare Other | Source: Ambulatory Visit | Attending: Internal Medicine | Admitting: Internal Medicine

## 2012-06-18 DIAGNOSIS — Z1239 Encounter for other screening for malignant neoplasm of breast: Secondary | ICD-10-CM

## 2012-08-02 ENCOUNTER — Telehealth: Payer: Self-pay | Admitting: *Deleted

## 2012-08-02 NOTE — Telephone Encounter (Signed)
This is very difficult, since all the inhalers are expensive,  Please ask to ask her pharmacist to let her know prices for dulera and advair, and let us know if one is less expensive

## 2012-08-02 NOTE — Telephone Encounter (Signed)
Received fax stating pt is requesting something cheaper than symbicort. Med is too expensive...Raechel Chute

## 2012-08-03 NOTE — Telephone Encounter (Signed)
Daughter call ed back gave md response. She states will find out and let us know...lmb

## 2012-08-03 NOTE — Telephone Encounter (Signed)
Called pt daughter Foye Clock) no answer LMOM RTC...Raechel Chute

## 2013-01-26 ENCOUNTER — Other Ambulatory Visit: Payer: Self-pay | Admitting: Internal Medicine

## 2013-02-03 ENCOUNTER — Other Ambulatory Visit: Payer: Self-pay | Admitting: Internal Medicine

## 2013-05-12 ENCOUNTER — Telehealth: Payer: Self-pay

## 2013-05-12 MED ORDER — BUDESONIDE-FORMOTEROL FUMARATE 160-4.5 MCG/ACT IN AERO: 2.0000 | INHALATION_SPRAY | Freq: Two times a day (BID) | RESPIRATORY_TRACT | Status: DC

## 2013-05-12 NOTE — Telephone Encounter (Signed)
[facility]  Callback [phone]- [phone] (name- Karina Andrews)

## 2013-05-12 NOTE — Telephone Encounter (Signed)
Sent refill back to [facility]...lmb 

## 2013-06-09 ENCOUNTER — Ambulatory Visit: Payer: Medicare Other | Admitting: Internal Medicine

## 2013-06-15 ENCOUNTER — Ambulatory Visit (INDEPENDENT_AMBULATORY_CARE_PROVIDER_SITE_OTHER): Payer: Medicare PPO | Admitting: Internal Medicine

## 2013-06-15 ENCOUNTER — Encounter: Payer: Self-pay | Admitting: Internal Medicine

## 2013-06-15 VITALS — BP 120/68 | HR 108 | Temp 98.3°F | Wt 141.8 lb

## 2013-06-15 DIAGNOSIS — G4700 Insomnia, unspecified: Secondary | ICD-10-CM

## 2013-06-15 DIAGNOSIS — I350 Nonrheumatic aortic (valve) stenosis: Secondary | ICD-10-CM | POA: Insufficient documentation

## 2013-06-15 DIAGNOSIS — R011 Cardiac murmur, unspecified: Secondary | ICD-10-CM

## 2013-06-15 DIAGNOSIS — J4489 Other specified chronic obstructive pulmonary disease: Secondary | ICD-10-CM

## 2013-06-15 DIAGNOSIS — J449 Chronic obstructive pulmonary disease, unspecified: Secondary | ICD-10-CM

## 2013-06-15 DIAGNOSIS — I10 Essential (primary) hypertension: Secondary | ICD-10-CM

## 2013-06-15 MED ORDER — TEMAZEPAM 15 MG PO CAPS: 15.0000 mg | ORAL_CAPSULE | Freq: Every evening | ORAL | Status: DC | PRN

## 2013-06-15 NOTE — Assessment & Plan Note (Signed)
BP Readings from Last 3 Encounters:  06/15/13 120/68  04/20/12 122/72  11/03/11 124/68   Usually well controlled with current ARB -  Changed from Benicar to generic losartan per formulary 2014 The current medical regimen is effective;  continue present plan and medications.

## 2013-06-15 NOTE — Assessment & Plan Note (Signed)
Chronic issues, suspect exacerbated by progressive dementia with sundowning Family requests medication for treatment of same We'll try moderate dose benzodiazepine -Restoril 15 mg each bedtime as needed Instructed on importance of minimizing alcohol intake Will consider alternate medication for sundowning if unimproved/worse

## 2013-06-15 NOTE — Patient Instructions (Addendum)
It was good to see you today.  We have reviewed your prior records including labs and tests today  we'll make referral for echocardiogram to evaluate heart murmur. Our office will contact you regarding appointment(s) once made. Then, your results will be released to MyChart (or called to you) after review, usually within 72hours after test completion. If any changes need to be made, you will be notified at that same time.  Medications reviewed and updated, use restoril for sleep at bedtime as needed - no other changes recommended at this time.  Please schedule followup in 6-8 weeks to review, call sooner if problems.

## 2013-06-15 NOTE — Progress Notes (Signed)
Subjective:    Patient ID: [name] Dull, female    DOB: 1930-12-19, 80 y.o.   MRN: [id number]  HPI  Patient is here for follow up  - reviewed chronic medical issues and interval medical events (last OV 03/2012)  Reports detection of cardiac murmur by anesthesia last month during treatment for right cataract  Family friend also reports sleep problems with increasing agitation  Past Medical History  Diagnosis Date  . Hypertension   . Depression   . Osteoporosis     (R) hip fx 11/2009 & (L) hip fx 2006  . COPD (chronic obstructive pulmonary disease)   . Cervical spondylosis with myelopathy 01/2009    s/p decompression    Review of Systems  Constitutional: Negative for fever, fatigue and unexpected weight change.  Respiratory: Positive for cough (chronic, but improved with Zpak and hydromet last week at UC eval). Negative for shortness of breath and wheezing.   Cardiovascular: Negative for chest pain, palpitations and leg swelling.  Neurological: Negative for dizziness, syncope and light-headedness.  Psychiatric/Behavioral: Positive for confusion (sundowning per friend) and sleep disturbance. Negative for dysphoric mood and decreased concentration.       Objective:   Physical Exam  There were no vitals taken for this visit. Wt Readings from Last 3 Encounters:  04/20/12 137 lb 12.8 oz (62.506 kg)  11/03/11 143 lb 9.6 oz (65.137 kg)  01/24/11 138 lb (62.596 kg)   Constitutional: She appears fatigued but nontoxic - family friend (CNA) at side  Cardiovascular: Normal rate, regular rhythm and normal heart sounds.  Holosystolic 3/6 murmur heard. No BLE edema. Pulmonary/Chest: Effort normal, but breath sounds diminished B bases - no crackles.  She has no exp wheezes. MSkel: Back: full range of motion of thoracic and lumbar spine. Non tender to palpation. DTR's are symmetrically intact. Sensation intact in all dermatomes of the lower extremities. Full strength to manual muscle  testing. ambulates with chronically myelopathic, wide based and antalgic gait, using walker for balance. Neurological: She is alert and oriented to person, place, and time. No cranial nerve deficit. Coordination normal.   Lab Results  Component Value Date   WBC 7.8 11/03/2011   HGB 13.9 11/03/2011   HCT 41.9 11/03/2011   PLT 184.0 11/03/2011   GLUCOSE 93 11/03/2011   CHOL 295* 12/12/2008   TRIG 95.0 12/12/2008   HDL 85.60 12/12/2008   LDLDIRECT 189.2 12/12/2008   ALT 20 11/03/2011   AST 19 11/03/2011   NA 139 11/03/2011   K 5.7* 11/03/2011   CL 101 11/03/2011   CREATININE 0.6 11/03/2011   BUN 21 11/03/2011   CO2 27 11/03/2011   TSH 1.95 11/03/2011   INR 1.65* 12/10/2009    Mm Digital Screening  06/21/2012   *RADIOLOGY REPORT*  Clinical Data: Screening.  DIGITAL BILATERAL SCREENING MAMMOGRAM WITH CAD DIGITAL BREAST TOMOSYNTHESIS  Digital breast tomosynthesis images are acquired in two projections.  These images are reviewed in combination with the digital mammogram, confirming the findings below.  Comparison:  Previous exams.  FINDINGS:  ACR Breast Density Category 2: There is a scattered fibroglandular pattern.  No suspicious masses, architectural distortion, or calcifications are present.  Images were processed with CAD.  IMPRESSION: No mammographic evidence of malignancy.  A result letter of this screening mammogram will be mailed directly to the patient.  RECOMMENDATION: Screening mammogram in one year. (Code:SM-B-01Y)  BI-RADS CATEGORY 1:  Negative.   Original Report Authenticated By: Norva PavlovElizabeth Brown, M.D.  Assessment & Plan:   Problem List Items Addressed This Visit   Heart murmur - Primary     Murmur on exam concerning for aortic stenosis Appears asymptomatic on history  Refer for 2-D echo, consider cardiology input if needed    Relevant Orders      2D Echocardiogram without contrast   HYPERTENSION      BP Readings from Last 3 Encounters:  06/15/13 120/68  04/20/12 122/72  11/03/11  124/68   Usually well controlled with current ARB -  Changed from Benicar to generic losartan per formulary 2014 The current medical regimen is effective;  continue present plan and medications.     Insomnia     Chronic issues, suspect exacerbated by progressive dementia with sundowning Family requests medication for treatment of same We'll try moderate dose benzodiazepine -Restoril 15 mg each bedtime as needed Instructed on importance of minimizing alcohol intake Will consider alternate medication for sundowning if unimproved/worse    OBSTRUCTIVE CHRONIC BRONCHITIS     Follows with pulm as needed -  Flare spring 2015 reviewed, improved with treatment at urgent care last month continue symbicort, prn Alb MDI  Intol of Daliresp 01/2011 Again, advised on importance of smoking cessation

## 2013-06-15 NOTE — Progress Notes (Signed)
Pre visit review using our clinic review tool, if applicable. No additional management support is needed unless otherwise documented below in the visit note. 

## 2013-06-15 NOTE — Assessment & Plan Note (Signed)
Murmur on exam concerning for aortic stenosis Appears asymptomatic on history  Refer for 2-D echo, consider cardiology input if needed

## 2013-06-15 NOTE — Assessment & Plan Note (Signed)
Follows with pulm as needed -  Flare spring 2015 reviewed, improved with treatment at urgent care last month continue symbicort, prn Alb MDI  Intol of Daliresp 01/2011 Again, advised on importance of smoking cessation

## 2013-07-05 ENCOUNTER — Other Ambulatory Visit: Payer: Self-pay | Admitting: Internal Medicine

## 2013-07-13 ENCOUNTER — Other Ambulatory Visit: Payer: Self-pay | Admitting: Internal Medicine

## 2013-07-13 NOTE — Telephone Encounter (Signed)
OK X1 

## 2013-07-13 NOTE — Telephone Encounter (Signed)
MD out of office. Pls advise on refill.../lmb 

## 2013-07-14 NOTE — Telephone Encounter (Signed)
Generated rx, md signed faxed back to [facility]...Raechel Chute/lmb

## 2013-08-09 ENCOUNTER — Other Ambulatory Visit: Payer: Self-pay | Admitting: Internal Medicine

## 2013-09-14 ENCOUNTER — Emergency Department (HOSPITAL_COMMUNITY): Payer: Medicare PPO

## 2013-09-14 ENCOUNTER — Emergency Department (HOSPITAL_COMMUNITY)
Admission: EM | Admit: 2013-09-14 | Discharge: 2013-09-14 | Disposition: A | Discharge status: Home / Self Care | Payer: Medicare PPO | Attending: Emergency Medicine | Admitting: Emergency Medicine

## 2013-09-14 ENCOUNTER — Encounter (HOSPITAL_COMMUNITY): Payer: Self-pay | Admitting: Emergency Medicine

## 2013-09-14 DIAGNOSIS — W010XXA Fall on same level from slipping, tripping and stumbling without subsequent striking against object, initial encounter: Secondary | ICD-10-CM | POA: Insufficient documentation

## 2013-09-14 DIAGNOSIS — Z8659 Personal history of other mental and behavioral disorders: Secondary | ICD-10-CM | POA: Diagnosis not present

## 2013-09-14 DIAGNOSIS — Y92009 Unspecified place in unspecified non-institutional (private) residence as the place of occurrence of the external cause: Secondary | ICD-10-CM | POA: Diagnosis not present

## 2013-09-14 DIAGNOSIS — F17200 Nicotine dependence, unspecified, uncomplicated: Secondary | ICD-10-CM | POA: Insufficient documentation

## 2013-09-14 DIAGNOSIS — Y9301 Activity, walking, marching and hiking: Secondary | ICD-10-CM | POA: Diagnosis not present

## 2013-09-14 DIAGNOSIS — S2231XA Fracture of one rib, right side, initial encounter for closed fracture: Secondary | ICD-10-CM

## 2013-09-14 DIAGNOSIS — J449 Chronic obstructive pulmonary disease, unspecified: Secondary | ICD-10-CM | POA: Diagnosis not present

## 2013-09-14 DIAGNOSIS — I10 Essential (primary) hypertension: Secondary | ICD-10-CM | POA: Diagnosis not present

## 2013-09-14 DIAGNOSIS — M810 Age-related osteoporosis without current pathological fracture: Secondary | ICD-10-CM | POA: Insufficient documentation

## 2013-09-14 DIAGNOSIS — S2249XA Multiple fractures of ribs, unspecified side, initial encounter for closed fracture: Secondary | ICD-10-CM | POA: Diagnosis not present

## 2013-09-14 DIAGNOSIS — S298XXA Other specified injuries of thorax, initial encounter: Secondary | ICD-10-CM | POA: Diagnosis present

## 2013-09-14 DIAGNOSIS — J4489 Other specified chronic obstructive pulmonary disease: Secondary | ICD-10-CM | POA: Insufficient documentation

## 2013-09-14 MED ORDER — METHOCARBAMOL 1000 MG/10ML IJ SOLN
1000.0000 mg | Freq: Once | INTRAMUSCULAR | Status: DC
  Filled 2013-09-14: qty 10

## 2013-09-14 MED ORDER — OXYCODONE-ACETAMINOPHEN 5-325 MG PO TABS: 1.0000 | ORAL_TABLET | Freq: Four times a day (QID) | ORAL | Status: DC | PRN

## 2013-09-14 MED ORDER — METHOCARBAMOL 1000 MG/10ML IJ SOLN: 1000.0000 mg | Freq: Once | INTRAMUSCULAR | Status: DC

## 2013-09-14 MED ORDER — KETOROLAC TROMETHAMINE 30 MG/ML IJ SOLN
30.0000 mg | Freq: Once | INTRAMUSCULAR | Status: AC
  Administered 2013-09-14: 30 mg via INTRAVENOUS
  Filled 2013-09-14: qty 1

## 2013-09-14 MED ORDER — DEXTROSE 5 % IV SOLN
1000.0000 mg | Freq: Once | INTRAVENOUS | Status: AC
  Administered 2013-09-14: 1000 mg via INTRAVENOUS
  Filled 2013-09-14: qty 10

## 2013-09-14 MED ORDER — FENTANYL CITRATE 0.05 MG/ML IJ SOLN
50.0000 ug | Freq: Once | INTRAMUSCULAR | Status: AC
  Administered 2013-09-14: 50 ug via INTRAVENOUS
  Filled 2013-09-14: qty 2

## 2013-09-14 MED ORDER — OXYCODONE-ACETAMINOPHEN 5-325 MG PO TABS
1.0000 | ORAL_TABLET | Freq: Once | ORAL | Status: AC
  Administered 2013-09-14: 1 via ORAL
  Filled 2013-09-14: qty 1

## 2013-09-14 MED ORDER — ALBUTEROL SULFATE HFA 108 (90 BASE) MCG/ACT IN AERS: 1.0000 | INHALATION_SPRAY | RESPIRATORY_TRACT | Status: DC | PRN

## 2013-09-14 MED ORDER — IBUPROFEN 800 MG PO TABS: 800.0000 mg | ORAL_TABLET | Freq: Three times a day (TID) | ORAL | Status: DC

## 2013-09-14 NOTE — ED Notes (Signed)
Pt was at home using walker when tripped and fell landing on top of walker.  Denies hitting head, LOC.  Reports pain left flank and right ribs below breast, pain increases with R arm movement.

## 2013-09-14 NOTE — ED Notes (Signed)
Patient transported to X-ray 

## 2013-09-14 NOTE — ED Notes (Signed)
RT contacted to teach IS to pt.

## 2013-09-14 NOTE — Discharge Instructions (Signed)

## 2013-09-14 NOTE — ED Provider Notes (Signed)
CSN: [id number]     Arrival date & time 09/14/13  0330 History   First MD Initiated Contact with Patient 09/14/13 [phone]     Chief Complaint  Patient presents with  . Fall     (Consider location/radiation/quality/duration/timing/severity/associated sxs/prior Treatment) Patient is a 80 y.o. female presenting with fall. The history is provided by the patient.  Fall This is a new problem. The current episode started less than 1 hour ago. The problem occurs constantly. The problem has not changed since onset.Pertinent negatives include no abdominal pain, no headaches and no shortness of breath. Associated symptoms comments: Rib pain from hitting her ribs on walker did not strike head no LOC no neck pain. Nothing aggravates the symptoms. Nothing relieves the symptoms. She has tried nothing for the symptoms. The treatment provided no relief.    Past Medical History  Diagnosis Date  . Hypertension   . Depression   . Osteoporosis     (R) hip fx 11/2009 & (L) hip fx 2006  . COPD (chronic obstructive pulmonary disease)   . Cervical spondylosis with myelopathy 01/2009    s/p decompression   Past Surgical History  Procedure Laterality Date  . Bladder tuck    . Cholecystectomy  1995  . Posterior laminectomy / decompression cervical spine  01/2009    Done by Dr. Danielle DessElsner  . Right hip  11/2009    ORIF/ Done by Dr. Turner Danielsowan  . Cataract extraction Right 06/06/13   History reviewed. No pertinent family history. History  Substance Use Topics  . Smoking status: Current Every Day Smoker    Types: Cigarettes  . Smokeless tobacco: Not on file     Comment: Smoker 1 cigarette per day. 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 Alcohol Use: No   OB History   Grav Para Term Preterm Abortions TAB SAB Ect Mult Living                 Review of Systems  Constitutional: Negative for fever.  Respiratory: Negative for shortness of breath and wheezing.    Cardiovascular: Negative for palpitations and leg swelling.  Gastrointestinal: Negative for abdominal pain.  Neurological: Negative for dizziness, facial asymmetry, weakness, light-headedness, numbness and headaches.  All other systems reviewed and are negative.     Allergies  Review of patient's allergies indicates no known allergies.  Home Medications   Prior to Admission medications   Medication Sig Start Date End Date Taking? Authorizing Provider  albuterol (PROVENTIL HFA) 108 (90 BASE) MCG/ACT inhaler Inhale 2 puffs into the lungs every 6 (six) hours as needed for wheezing or shortness of breath. 04/20/12   Newt LukesValerie A Leschber, MD  BESIVANCE 0.6 % SUSP Use 1 drop in right eye three times a day 06/07/13   Historical Provider, MD  budesonide-formoterol (SYMBICORT) 160-4.5 MCG/ACT inhaler Inhale 2 puffs into the lungs 2 (two) times daily. 05/12/13   Newt LukesValerie A Leschber, MD  denosumab (PROLIA) 60 MG/ML SOLN Inject 60 mg into the skin every 6 (six) months.      Historical Provider, MD  DUREZOL 0.05 % EMUL Use 1 drop in right eye twice a day 05/17/13   Historical Provider, MD  ILEVRO 0.3 % SUSP Use 1 drop in right eyes at bedtime 05/17/13   Historical Provider, MD  losartan (COZAAR) 50 MG tablet Take 1 tablet (50 mg total) by mouth daily. 04/20/12   Newt LukesValerie A Leschber, MD  losartan (COZAAR) 50 MG tablet TAKE 1 TABLET  BY MOUTH EVERY DAY    Newt Lukes, MD  temazepam (RESTORIL) 15 MG capsule TAKE ONE CAPSULE BY MOUTH AT BEDTIME AS NEEDED FOR SLEEP.    Pecola Lawless, MD  VENTOLIN HFA 108 (90 BASE) MCG/ACT inhaler INHALE 2 PUFFS INTO LUNGS EVERY 6 HOURS AS NEEDED FOR WHEEZING OR SHORTNESS OF BREATH    Newt Lukes, MD   BP 152/86  Pulse 95  Temp(Src) 98.2 F (36.8 C) (Oral)  Resp 23  Ht 5\' 3"  (1.6 m)  Wt 135 lb (61.236 kg)  BMI 23.92 kg/m2  SpO2 95% Physical Exam  Constitutional: She is oriented to person, place, and time. She appears well-developed and well-nourished. No  distress.  HENT:  Head: Normocephalic and atraumatic. Head is without raccoon's eyes and without Battle's sign.  Right Ear: No mastoid tenderness. No hemotympanum.  Left Ear: No mastoid tenderness. No hemotympanum.  Mouth/Throat: Oropharynx is clear and moist.  Eyes: Conjunctivae and EOM are normal. Pupils are equal, round, and reactive to light.  Neck: Normal range of motion. Neck supple.  No midline tenderness of the c t or l psine  Cardiovascular: Normal rate, regular rhythm and intact distal pulses.   Pulmonary/Chest: Effort normal. She has no wheezes. She has no rales. She exhibits tenderness.  Abdominal: Soft. Bowel sounds are normal. There is no tenderness. There is no rebound and no guarding.  Musculoskeletal: Normal range of motion. She exhibits no edema and no tenderness.  No snuff box tenderness of either wrist.  Negative anterior and posterior drawer tests of B knees  Neurological: She is alert and oriented to person, place, and time. She has normal reflexes. She exhibits normal muscle tone. Coordination normal.  Skin: Skin is warm and dry. She is not diaphoretic.  Psychiatric: She has a normal mood and affect.    ED Course  Procedures (including critical care time) Labs Review Labs Reviewed - No data to display  Imaging Review No results found.   EKG Interpretation None      MDM   Final diagnoses:  None    Date: 09/14/2013  Rate: 94  Rhythm: normal sinus rhythm  QRS Axis: normal  Intervals: normal  ST/T Wave abnormalities: normal  Conduction Disutrbances: none  Narrative Interpretation: unremarkable      Incentive spirometer use Q 1 hr, demonstrated by repiratory.  Percocet, ibuprofen and close follow up with your PMD.    Allyiah Gartner K Sosaia Pittinger-Rasch, MD 09/14/13 [phone]

## 2013-09-16 ENCOUNTER — Ambulatory Visit: Payer: Medicare PPO | Admitting: Internal Medicine

## 2013-09-16 ENCOUNTER — Encounter (HOSPITAL_COMMUNITY): Payer: Self-pay | Admitting: Emergency Medicine

## 2013-09-16 ENCOUNTER — Inpatient Hospital Stay (HOSPITAL_COMMUNITY)
Admission: EM | Admit: 2013-09-16 | Discharge: 2013-09-19 | DRG: 205 | Disposition: A | Discharge status: Home Health | Payer: Medicare PPO | Attending: Internal Medicine | Admitting: Internal Medicine

## 2013-09-16 ENCOUNTER — Emergency Department (HOSPITAL_COMMUNITY): Payer: Medicare PPO

## 2013-09-16 DIAGNOSIS — W19XXXA Unspecified fall, initial encounter: Secondary | ICD-10-CM

## 2013-09-16 DIAGNOSIS — F329 Major depressive disorder, single episode, unspecified: Secondary | ICD-10-CM

## 2013-09-16 DIAGNOSIS — R0902 Hypoxemia: Secondary | ICD-10-CM

## 2013-09-16 DIAGNOSIS — Z9181 History of falling: Secondary | ICD-10-CM

## 2013-09-16 DIAGNOSIS — W19XXXS Unspecified fall, sequela: Secondary | ICD-10-CM

## 2013-09-16 DIAGNOSIS — M810 Age-related osteoporosis without current pathological fracture: Secondary | ICD-10-CM

## 2013-09-16 DIAGNOSIS — Y92009 Unspecified place in unspecified non-institutional (private) residence as the place of occurrence of the external cause: Secondary | ICD-10-CM

## 2013-09-16 DIAGNOSIS — W010XXA Fall on same level from slipping, tripping and stumbling without subsequent striking against object, initial encounter: Secondary | ICD-10-CM | POA: Diagnosis present

## 2013-09-16 DIAGNOSIS — F3289 Other specified depressive episodes: Secondary | ICD-10-CM

## 2013-09-16 DIAGNOSIS — N3946 Mixed incontinence: Secondary | ICD-10-CM

## 2013-09-16 DIAGNOSIS — E785 Hyperlipidemia, unspecified: Secondary | ICD-10-CM

## 2013-09-16 DIAGNOSIS — IMO0002 Reserved for concepts with insufficient information to code with codable children: Secondary | ICD-10-CM

## 2013-09-16 DIAGNOSIS — I10 Essential (primary) hypertension: Secondary | ICD-10-CM

## 2013-09-16 DIAGNOSIS — I350 Nonrheumatic aortic (valve) stenosis: Secondary | ICD-10-CM | POA: Diagnosis present

## 2013-09-16 DIAGNOSIS — R011 Cardiac murmur, unspecified: Secondary | ICD-10-CM | POA: Diagnosis present

## 2013-09-16 DIAGNOSIS — G4700 Insomnia, unspecified: Secondary | ICD-10-CM

## 2013-09-16 DIAGNOSIS — S2239XA Fracture of one rib, unspecified side, initial encounter for closed fracture: Secondary | ICD-10-CM

## 2013-09-16 DIAGNOSIS — J449 Chronic obstructive pulmonary disease, unspecified: Secondary | ICD-10-CM

## 2013-09-16 DIAGNOSIS — R0602 Shortness of breath: Secondary | ICD-10-CM | POA: Diagnosis not present

## 2013-09-16 DIAGNOSIS — S2249XA Multiple fractures of ribs, unspecified side, initial encounter for closed fracture: Principal | ICD-10-CM | POA: Diagnosis present

## 2013-09-16 DIAGNOSIS — J441 Chronic obstructive pulmonary disease with (acute) exacerbation: Secondary | ICD-10-CM | POA: Diagnosis present

## 2013-09-16 DIAGNOSIS — S2231XS Fracture of one rib, right side, sequela: Secondary | ICD-10-CM

## 2013-09-16 DIAGNOSIS — T7589XS Other specified effects of external causes, sequela: Secondary | ICD-10-CM

## 2013-09-16 DIAGNOSIS — J90 Pleural effusion, not elsewhere classified: Secondary | ICD-10-CM | POA: Diagnosis present

## 2013-09-16 DIAGNOSIS — M5000 Cervical disc disorder with myelopathy, unspecified cervical region: Secondary | ICD-10-CM

## 2013-09-16 DIAGNOSIS — F17200 Nicotine dependence, unspecified, uncomplicated: Secondary | ICD-10-CM | POA: Diagnosis present

## 2013-09-16 DIAGNOSIS — J9600 Acute respiratory failure, unspecified whether with hypoxia or hypercapnia: Secondary | ICD-10-CM | POA: Diagnosis present

## 2013-09-16 DIAGNOSIS — E860 Dehydration: Secondary | ICD-10-CM | POA: Diagnosis present

## 2013-09-16 LAB — CBC WITH DIFFERENTIAL/PLATELET
BASOS ABS: 0 10*3/uL (ref 0.0–0.1)
BASOS PCT: 0 % (ref 0–1)
EOS ABS: 0.2 10*3/uL (ref 0.0–0.7)
Eosinophils Relative: 2 % (ref 0–5)
HCT: 41.6 % (ref 36.0–46.0)
HEMOGLOBIN: 13.9 g/dL (ref 12.0–15.0)
Lymphocytes Relative: 14 % (ref 12–46)
Lymphs Abs: 1 10*3/uL (ref 0.7–4.0)
MCH: 32.1 pg (ref 26.0–34.0)
MCHC: 33.4 g/dL (ref 30.0–36.0)
MCV: 96.1 fL (ref 78.0–100.0)
MONOS PCT: 8 % (ref 3–12)
Monocytes Absolute: 0.6 10*3/uL (ref 0.1–1.0)
NEUTROS PCT: 76 % (ref 43–77)
Neutro Abs: 5.7 10*3/uL (ref 1.7–7.7)
Platelets: 171 10*3/uL (ref 150–400)
RBC: 4.33 MIL/uL (ref 3.87–5.11)
RDW: 13.8 % (ref 11.5–15.5)
WBC: 7.5 10*3/uL (ref 4.0–10.5)

## 2013-09-16 LAB — BASIC METABOLIC PANEL
Anion gap: 17 — ABNORMAL HIGH (ref 5–15)
BUN: 15 mg/dL (ref 6–23)
CHLORIDE: 99 meq/L (ref 96–112)
CO2: 24 mEq/L (ref 19–32)
Calcium: 10.6 mg/dL — ABNORMAL HIGH (ref 8.4–10.5)
Creatinine, Ser: 0.87 mg/dL (ref 0.50–1.10)
GFR, EST AFRICAN AMERICAN: 70 mL/min — AB (ref >90)
GFR, EST NON AFRICAN AMERICAN: 61 mL/min — AB (ref >90)
Glucose, Bld: 99 mg/dL (ref 70–99)
Potassium: 4 mEq/L (ref 3.7–5.3)
SODIUM: 140 meq/L (ref 137–147)

## 2013-09-16 LAB — I-STAT TROPONIN, ED: Troponin i, poc: 0 ng/mL (ref 0.00–0.08)

## 2013-09-16 MED ORDER — MORPHINE SULFATE 2 MG/ML IJ SOLN
1.0000 mg | INTRAMUSCULAR | Status: DC | PRN
  Administered 2013-09-16 – 2013-09-17 (×4): 1 mg via INTRAVENOUS
  Filled 2013-09-16 (×4): qty 1

## 2013-09-16 MED ORDER — ENOXAPARIN SODIUM 40 MG/0.4ML ~~LOC~~ SOLN
40.0000 mg | SUBCUTANEOUS | Status: DC
Start: 2013-09-16 — End: 2013-09-19
  Administered 2013-09-16 – 2013-09-17 (×2): 40 mg via SUBCUTANEOUS
  Filled 2013-09-16 (×4): qty 0.4

## 2013-09-16 MED ORDER — SODIUM CHLORIDE 0.9 % IV SOLN
INTRAVENOUS | Status: DC
Start: 2013-09-16 — End: 2013-09-17
  Administered 2013-09-16: 15:00:00 via INTRAVENOUS
  Administered 2013-09-17: 50 mL/h via INTRAVENOUS

## 2013-09-16 MED ORDER — FENTANYL CITRATE 0.05 MG/ML IJ SOLN
25.0000 ug | INTRAMUSCULAR | Status: DC | PRN
  Administered 2013-09-16: 25 ug via INTRAVENOUS
  Filled 2013-09-16: qty 2

## 2013-09-16 MED ORDER — ONDANSETRON HCL 4 MG PO TABS
4.0000 mg | ORAL_TABLET | Freq: Four times a day (QID) | ORAL | Status: DC | PRN
  Filled 2013-09-16: qty 1

## 2013-09-16 MED ORDER — TEMAZEPAM 15 MG PO CAPS
15.0000 mg | ORAL_CAPSULE | Freq: Every evening | ORAL | Status: DC | PRN
  Administered 2013-09-19: 15 mg via ORAL
  Filled 2013-09-16: qty 1

## 2013-09-16 MED ORDER — LOSARTAN POTASSIUM 50 MG PO TABS
50.0000 mg | ORAL_TABLET | Freq: Every day | ORAL | Status: DC
  Administered 2013-09-16 – 2013-09-19 (×4): 50 mg via ORAL
  Filled 2013-09-16 (×4): qty 1

## 2013-09-16 MED ORDER — PNEUMOCOCCAL VAC POLYVALENT 25 MCG/0.5ML IJ INJ
0.5000 mL | INJECTION | INTRAMUSCULAR | Status: AC
  Administered 2013-09-17: 0.5 mL via INTRAMUSCULAR
  Filled 2013-09-16: qty 0.5

## 2013-09-16 MED ORDER — ACETAMINOPHEN 650 MG RE SUPP: 650.0000 mg | Freq: Four times a day (QID) | RECTAL | Status: DC | PRN

## 2013-09-16 MED ORDER — OXYCODONE-ACETAMINOPHEN 5-325 MG PO TABS
1.0000 | ORAL_TABLET | Freq: Four times a day (QID) | ORAL | Status: DC | PRN
  Administered 2013-09-16 – 2013-09-19 (×8): 1 via ORAL
  Filled 2013-09-16 (×9): qty 1

## 2013-09-16 MED ORDER — BIOTENE DRY MOUTH MT LIQD
15.0000 mL | Freq: Two times a day (BID) | OROMUCOSAL | Status: DC
  Administered 2013-09-16 – 2013-09-19 (×6): 15 mL via OROMUCOSAL

## 2013-09-16 MED ORDER — IBUPROFEN 800 MG PO TABS
800.0000 mg | ORAL_TABLET | Freq: Three times a day (TID) | ORAL | Status: DC
  Administered 2013-09-16 – 2013-09-19 (×9): 800 mg via ORAL
  Filled 2013-09-16 (×11): qty 1

## 2013-09-16 MED ORDER — ALBUTEROL SULFATE (2.5 MG/3ML) 0.083% IN NEBU: 2.5000 mg | INHALATION_SOLUTION | RESPIRATORY_TRACT | Status: DC | PRN

## 2013-09-16 MED ORDER — ACETAMINOPHEN 325 MG PO TABS
650.0000 mg | ORAL_TABLET | Freq: Four times a day (QID) | ORAL | Status: DC | PRN
  Administered 2013-09-17 (×2): 650 mg via ORAL
  Filled 2013-09-16 (×2): qty 2

## 2013-09-16 MED ORDER — BUDESONIDE-FORMOTEROL FUMARATE 160-4.5 MCG/ACT IN AERO
2.0000 | INHALATION_SPRAY | Freq: Two times a day (BID) | RESPIRATORY_TRACT | Status: DC
  Administered 2013-09-16 – 2013-09-19 (×6): 2 via RESPIRATORY_TRACT
  Filled 2013-09-16: qty 6

## 2013-09-16 MED ORDER — ONDANSETRON HCL 4 MG/2ML IJ SOLN: 4.0000 mg | Freq: Four times a day (QID) | INTRAMUSCULAR | Status: DC | PRN

## 2013-09-16 NOTE — ED Notes (Signed)
Per Dr. Elesa MassedWard, pt's O2 sats were in 80s during exam.  O2 applied at 3 liters via nasal cannula by Dr. Elesa MassedWard.

## 2013-09-16 NOTE — Progress Notes (Signed)
Admission note:  Arrival Method: bed from ED Mental Orientation: A&O X4 Telemetry: N/A Assessment: See doc flowsheet Skin: Bruising on R side and back IV: R forearm Pain: 6/10 Safety Measures: Fall risk interventions Fall Prevention Safety Plan: Bed alarm, yellow socks, etc. Admission Screening: to be completed .

## 2013-09-16 NOTE — Progress Notes (Signed)
Educated patient on use of incentive spirometer and flutter valve.  Patient demonstrated understanding.

## 2013-09-16 NOTE — Progress Notes (Signed)
Educated pt on why disposable briefs and pull ups are not permitted in hospital due to risk of skin breakdown. Pt refused to remove brief. Will continue to monitor.

## 2013-09-16 NOTE — ED Notes (Signed)
Pt escorted to bathroom to void, pt taken in wheelchair.  Pt able to void without difficulty, upon return, pt reports increased shortness of breath with the exertion, O2 at 93% with pt on room air.

## 2013-09-16 NOTE — ED Provider Notes (Signed)
TIME SEEN: 12:05 PM  CHIEF COMPLAINT: Shortness of breath, right-sided rib pain  HPI: Patient is a 80 y.o. F with history of hypertension, COPD who is not on chronic oxygen who had a mechanical fall 2 days ago. She states that she tripped over the strip on the floor in between her hardwood floors and her carpet and landed on her right side against her walker. She then slid to her bottom down to the ground. She did not hit her head or lose consciousness. She states she's been taking pain medication at home but her pain is not controlled. She denies any fevers or cough. No other chest pain. No other recent fall. She was seen in the emergency department 2 days ago and diagnosed with a fifth and sixth right rib fracture.  ROS: See HPI Constitutional: no fever  Eyes: no drainage  ENT: no runny nose   Cardiovascular:   chest pain  Resp: SOB  GI: no vomiting GU: no dysuria Integumentary: no rash  Allergy: no hives  Musculoskeletal: no leg swelling  Neurological: no slurred speech ROS otherwise negative  PAST MEDICAL HISTORY/PAST SURGICAL HISTORY:  Past Medical History  Diagnosis Date  . Hypertension   . Depression   . Osteoporosis     (R) hip fx 11/2009 & (L) hip fx 2006  . COPD (chronic obstructive pulmonary disease)   . Cervical spondylosis with myelopathy 01/2009    s/p decompression    MEDICATIONS:  Prior to Admission medications   Medication Sig Start Date End Date Taking? Authorizing Provider  albuterol (PROVENTIL HFA) 108 (90 BASE) MCG/ACT inhaler Inhale 2 puffs into the lungs every 6 (six) hours as needed for wheezing or shortness of breath. 04/20/12   Newt LukesValerie A Leschber, MD  albuterol (PROVENTIL HFA;VENTOLIN HFA) 108 (90 BASE) MCG/ACT inhaler Inhale 1-2 puffs into the lungs every 4 (four) hours as needed for wheezing or shortness of breath. 09/14/13   April K Palumbo-Rasch, MD  budesonide-formoterol [facility](SYMBICORT) 160-4.5 MCG/ACT inhaler Inhale 2 puffs into the lungs 2 (two) times  daily. 05/12/13   Newt LukesValerie A Leschber, MD  denosumab (PROLIA) 60 MG/ML SOLN Inject 60 mg into the skin every 6 (six) months.      Historical Provider, MD  ibuprofen (ADVIL,MOTRIN) 800 MG tablet Take 1 tablet (800 mg total) by mouth 3 (three) times daily. Take with food 09/14/13   April K Palumbo-Rasch, MD  losartan (COZAAR) 50 MG tablet Take 1 tablet (50 mg total) by mouth daily. 04/20/12   Newt LukesValerie A Leschber, MD  oxyCODONE-acetaminophen (PERCOCET) 5-325 MG per tablet Take 1 tablet by mouth every 6 (six) hours as needed. 09/14/13   April K Palumbo-Rasch, MD  temazepam (RESTORIL) 15 MG capsule Take 15 mg by mouth at bedtime as needed for sleep.    Historical Provider, MD    ALLERGIES:  No Known Allergies  SOCIAL HISTORY:  History  Substance Use Topics  . Smoking status: Current Every Day Smoker    Types: Cigarettes  . Smokeless tobacco: Not on file     Comment: Smoker 1 cigarette per day. 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 Alcohol Use: No    FAMILY HISTORY: History reviewed. No pertinent family history.  EXAM: BP 152/72  Pulse 74  Temp(Src) 97.7 F (36.5 C) (Oral)  Resp 16  SpO2 95% CONSTITUTIONAL: Alert and oriented and responds appropriately to questions. Well-appearing; well-nourished HEAD: Normocephalic EYES: Conjunctivae clear, PERRL ENT: normal nose; no rhinorrhea; moist mucous membranes;  pharynx without lesions noted NECK: Supple, no meningismus, no LAD  CARD: RRR; S1 and S2 appreciated; no murmurs, no clicks, no rubs, no gallops; tender to palpation of the right chest wall without crepitus or deformity RESP: Normal chest excursion without tachypnea; breath sounds clear and equal bilaterally; no wheezes, no rhonchi, no rales, patient is splinting secondary to pain and is hypoxic ABD/GI: Normal bowel sounds; non-distended; soft, non-tender, no rebound, no guarding BACK:  The back appears normal and is non-tender to  palpation, there is no CVA tenderness; no midline spinal tenderness or step-off or deformity EXT: Normal ROM in all joints; non-tender to palpation; no edema; normal capillary refill; no cyanosis    SKIN: Normal color for age and race; warm NEURO: Moves all extremities equally, sensation to light touch intact diffusely, cranial nerves II through XII intact, patient has very slow speech but this is her baseline PSYCH: The patient's mood and manner are appropriate. Grooming and personal hygiene are appropriate.  MEDICAL DECISION MAKING: Patient here with a fall 2 days ago with 2 rib fractures and is now hypoxic and has uncontrolled pain. Her lungs are clear to auscultation with good aeration. I do not feel there is a component of COPD contributing to her hypoxia but rather hypoxia secondary to splinting secondary to pain. Will repeat EKG and obtain basic labs, EKG. If hypoxia does not improve, anticipate admission.  ED PROGRESS: Patient's labs are unremarkable. Chest x-ray now also shows a seventh rib fracture and a small likely reactive pleural effusion. No infiltrate. Discussed with Dr. Marlin Canary with hospitalist service for admission given her new oxygen requirement. We'll admit to medical bed, observation.     EKG Interpretation  Date/Time:  Friday September 16 2013 12:27:02 EDT Ventricular Rate:  76 PR Interval:  152 QRS Duration: 77 QT Interval:  389 QTC Calculation: 437 R Axis:   32 Text Interpretation:  Sinus rhythm Low voltage, precordial leads Abnormal R-wave progression, early transition Confirmed by WARD,  DO, KRISTEN ([id number]) on 09/16/2013 1:09:10 PM        Layla Maw Ward, DO 09/16/13 1332

## 2013-09-16 NOTE — ED Notes (Signed)
Pt presents with continued R sided rib pain after falling x 2 days ago.  Pt seen here for same and discharged, pt reports compliance with medication, reports is not any better.  Daughter requested pt to come due to hypertension (170/80) before pt took morning meds.  Daughter asked EMS if pt could be admitted "for a few days because she fell".

## 2013-09-16 NOTE — H&P (Addendum)
[facility] History and Physical  Karina Andrews Karina Andrews DOB: 10-07-1930 DOA: 09/16/2013  Referring physician: er PCP: Rene Paci, MD   Chief Complaint:   HPI: Karina Andrews is a 80 y.o. female  Who lives home alone but daughter checks in on her.  She has HTN, COPD.  She gets around with a walker.  2 days ago, she fell, was seen in the ER with xrays and sent home with an incentive spirometry and pain meds.  She was unable to do the incentive spirometry secondary to pain.  She presents today with HTN and concerns from the daughter.  Upon eval in ER, patient was found to be hypoxic in the 80s on RA- given 2L o2 and now 95%.   A repeat x ray was done that showed: Trace right pleural effusion has developed in association with minimally to mildly displaced right lateral rib fractures (ribs 5, 6, 7). No fever, no chills Per daughter patient has had multiple falls in the years past.  Voice has been changing over the last few years (started after a fall), worsening in the last 6 weeks.   Review of Systems:  All systems reviewed negative unless stated above   Past Medical History  Diagnosis Date  . Hypertension   . Depression   . Osteoporosis     (R) hip fx 11/2009 & (L) hip fx 2006  . COPD (chronic obstructive pulmonary disease)   . Cervical spondylosis with myelopathy 01/2009    s/p decompression   Past Surgical History  Procedure Laterality Date  . Bladder tuck    . Cholecystectomy  1995  . Posterior laminectomy / decompression cervical spine  01/2009    Done by Dr. Danielle Dess  . Right hip  11/2009    ORIF/ Done by Dr. Turner Daniels  . Cataract extraction Right 06/06/13   Social History:  reports that she has been smoking Cigarettes.  She has been smoking about 0.00 packs per day. She does not have any smokeless tobacco history on file. She reports that she does not drink alcohol or use illicit drugs.  No Known Allergies  History reviewed. No pertinent family  history.   Prior to Admission medications   Medication Sig Start Date End Date Taking? Authorizing Provider  albuterol (PROVENTIL HFA) 108 (90 BASE) MCG/ACT inhaler Inhale 2 puffs into the lungs every 6 (six) hours as needed for wheezing or shortness of breath. 04/20/12   Newt Lukes, MD  albuterol (PROVENTIL HFA;VENTOLIN HFA) 108 (90 BASE) MCG/ACT inhaler Inhale 1-2 puffs into the lungs every 4 (four) hours as needed for wheezing or shortness of breath. 09/14/13   April K Palumbo-Rasch, MD  budesonide-formoterol [facility]) 160-4.5 MCG/ACT inhaler Inhale 2 puffs into the lungs 2 (two) times daily. 05/12/13   Newt Lukes, MD  denosumab (PROLIA) 60 MG/ML SOLN Inject 60 mg into the skin every 6 (six) months.      Historical Provider, MD  ibuprofen (ADVIL,MOTRIN) 800 MG tablet Take 1 tablet (800 mg total) by mouth 3 (three) times daily. Take with food 09/14/13   April K Palumbo-Rasch, MD  losartan (COZAAR) 50 MG tablet Take 1 tablet (50 mg total) by mouth daily. 04/20/12   Newt Lukes, MD  oxyCODONE-acetaminophen (PERCOCET) 5-325 MG per tablet Take 1 tablet by mouth every 6 (six) hours as needed. 09/14/13   April K Palumbo-Rasch, MD  temazepam (RESTORIL) 15 MG capsule Take 15 mg by mouth at bedtime as needed for sleep.  Historical Provider, MD   Physical Exam: Filed Vitals:   09/16/13 1137 09/16/13 1310 09/16/13 1333  BP: 152/72 149/78 175/104  Pulse: 74 83 79  Temp: 97.7 F (36.5 C)    TempSrc: Oral    Resp: 16 20 18   SpO2: 95% 96% 100%    Wt Readings from Last 3 Encounters:  09/14/13 61.236 kg (135 lb)  06/15/13 64.32 kg (141 lb 12.8 oz)  04/20/12 62.506 kg (137 lb 12.8 oz)    General:  Appears calm and comfortable, voice has tremor Eyes: PERRL, normal lids, irises & conjunctiva ENT: grossly normal hearing, lips & tongue Neck: no LAD, masses or thyromegaly Cardiovascular: RRR, +murmur, No LE edema. Telemetry: SR, no arrhythmias  Respiratory: CTA bilaterally, no  w/r/r. Normal respiratory effort. Abdomen: soft, ntnd Skin: no rash or induration seen on limited exam Musculoskeletal: grossly normal tone BUE/BLE Psychiatric: grossly normal mood and affect, speech fluent and appropriate Neurologic: grossly non-focal.          Labs on Admission:  Basic Metabolic Panel:  Recent Labs Lab 09/16/13 1206  NA 140  K 4.0  CL 99  CO2 24  GLUCOSE 99  BUN 15  CREATININE 0.87  CALCIUM 10.6*   Liver Function Tests: No results found for this basename: AST, ALT, ALKPHOS, BILITOT, PROT, ALBUMIN,  in the last 168 hours No results found for this basename: LIPASE, AMYLASE,  in the last 168 hours No results found for this basename: AMMONIA,  in the last 168 hours CBC:  Recent Labs Lab 09/16/13 1206  WBC 7.5  NEUTROABS 5.7  HGB 13.9  HCT 41.6  MCV 96.1  PLT 171   Cardiac Enzymes: No results found for this basename: CKTOTAL, CKMB, CKMBINDEX, TROPONINI,  in the last 168 hours  BNP (last 3 results) No results found for this basename: PROBNP,  in the last 8760 hours CBG: No results found for this basename: GLUCAP,  in the last 168 hours  Radiological Exams on Admission: Dg Chest 2 View  09/16/2013   CLINICAL DATA:  80 year old female fall with rib fracture and increased shortness of Breath. Initial encounter.  EXAM: CHEST  2 VIEW  COMPARISON:  09/14/2013 and earlier.  FINDINGS: Mildly displaced fractures of the right lateral fifth -seventh ribs now more apparent, but stable. No associated pneumothorax. Trace right pleural effusion now visible. Otherwise stable lung parenchyma. No consolidation or edema. Normal cardiac size and mediastinal contours. Osteopenia. Other visualized osseous structures are stable.  IMPRESSION: 1. Trace right pleural effusion has developed in association with minimally to mildly displaced right lateral rib fractures (ribs 5, 6, 7). 2. No pneumothorax and otherwise stable chest.   Electronically Signed   By: Augusto Gamble M.D.   On:  09/16/2013 13:01    EKG: Independently reviewed.   Assessment/Plan Active Problems:   Hypoxia   Fall   Rib fracture   Acute hypoxia- recent fall with rib fractures- patient has not been doing incentive spirometry secondary to pain- x ray today shows: Trace right pleural effusion has developed in association with minimally to mildly displaced right lateral rib fractures (ribs 5, 6, 7).  Patient does have COPD but is not currently wheezing -patient was given fentanyl as well, not sure if hypoxia was before or after  Fall- PT Eval, patient lives alone- has had falls in the past  Rib fracture- pulm toilet and IS  Mild dehydration- gentle IVF Mildly elevated calcium- repeat in AM   Code Status: full DVT Prophylaxis: Family  Communication: patient/daughter on cell phone Disposition Plan: obs  Time spent: 65 min  Marlin CanaryVANN, JESSICA [facility] Pager [phone]  **Disclaimer: This note may have been dictated with voice recognition software. Similar sounding words can inadvertently be transcribed and this note may contain transcription errors which may not have been corrected upon publication of note.**

## 2013-09-17 DIAGNOSIS — M810 Age-related osteoporosis without current pathological fracture: Secondary | ICD-10-CM | POA: Diagnosis present

## 2013-09-17 DIAGNOSIS — Y92009 Unspecified place in unspecified non-institutional (private) residence as the place of occurrence of the external cause: Secondary | ICD-10-CM | POA: Diagnosis not present

## 2013-09-17 DIAGNOSIS — J9600 Acute respiratory failure, unspecified whether with hypoxia or hypercapnia: Secondary | ICD-10-CM | POA: Diagnosis present

## 2013-09-17 DIAGNOSIS — J441 Chronic obstructive pulmonary disease with (acute) exacerbation: Secondary | ICD-10-CM | POA: Diagnosis present

## 2013-09-17 DIAGNOSIS — E860 Dehydration: Secondary | ICD-10-CM | POA: Diagnosis present

## 2013-09-17 DIAGNOSIS — J90 Pleural effusion, not elsewhere classified: Secondary | ICD-10-CM | POA: Diagnosis present

## 2013-09-17 DIAGNOSIS — I10 Essential (primary) hypertension: Secondary | ICD-10-CM | POA: Diagnosis present

## 2013-09-17 DIAGNOSIS — S2249XA Multiple fractures of ribs, unspecified side, initial encounter for closed fracture: Secondary | ICD-10-CM | POA: Diagnosis present

## 2013-09-17 DIAGNOSIS — Z9181 History of falling: Secondary | ICD-10-CM | POA: Diagnosis not present

## 2013-09-17 DIAGNOSIS — R0602 Shortness of breath: Secondary | ICD-10-CM | POA: Diagnosis present

## 2013-09-17 DIAGNOSIS — R011 Cardiac murmur, unspecified: Secondary | ICD-10-CM | POA: Diagnosis present

## 2013-09-17 DIAGNOSIS — F17200 Nicotine dependence, unspecified, uncomplicated: Secondary | ICD-10-CM | POA: Diagnosis present

## 2013-09-17 DIAGNOSIS — W010XXA Fall on same level from slipping, tripping and stumbling without subsequent striking against object, initial encounter: Secondary | ICD-10-CM | POA: Diagnosis present

## 2013-09-17 LAB — BASIC METABOLIC PANEL
Anion gap: 14 (ref 5–15)
BUN: 14 mg/dL (ref 6–23)
CALCIUM: 9.6 mg/dL (ref 8.4–10.5)
CO2: 22 mEq/L (ref 19–32)
Chloride: 107 mEq/L (ref 96–112)
Creatinine, Ser: 0.92 mg/dL (ref 0.50–1.10)
GFR calc non Af Amer: 57 mL/min — ABNORMAL LOW (ref >90)
GFR, EST AFRICAN AMERICAN: 66 mL/min — AB (ref >90)
GLUCOSE: 87 mg/dL (ref 70–99)
POTASSIUM: 4 meq/L (ref 3.7–5.3)
SODIUM: 143 meq/L (ref 137–147)

## 2013-09-17 LAB — CBC
HCT: 36.3 % (ref 36.0–46.0)
Hemoglobin: 12 g/dL (ref 12.0–15.0)
MCH: 31.4 pg (ref 26.0–34.0)
MCHC: 33.1 g/dL (ref 30.0–36.0)
MCV: 95 fL (ref 78.0–100.0)
Platelets: 171 10*3/uL (ref 150–400)
RBC: 3.82 MIL/uL — AB (ref 3.87–5.11)
RDW: 14.1 % (ref 11.5–15.5)
WBC: 5.7 10*3/uL (ref 4.0–10.5)

## 2013-09-17 MED ORDER — LIDOCAINE 5 % EX PTCH
1.0000 | MEDICATED_PATCH | CUTANEOUS | Status: DC
  Administered 2013-09-17 – 2013-09-19 (×3): 1 via TRANSDERMAL
  Filled 2013-09-17 (×3): qty 1

## 2013-09-17 NOTE — Progress Notes (Signed)
PROGRESS NOTE  MURREL FREET JXB:[id number] DOB: [birth year] DOA: 09/16/2013 PCP: Rene Paci, MD  Assessment/Plan: Acute hypoxia- recent fall with rib fractures- patient has not been doing incentive spirometry secondary to pain- x ray today shows: Trace right pleural effusion has developed in association with minimally to mildly displaced right lateral rib fractures (ribs 5, 6, 7). Patient does have COPD but is not currently wheezing  -Incentive spirometry -flutter valve  Fall- PT Eval, patient lives alone- has had falls in the past   Rib fracture- pulm toilet and IS   Mild dehydration- d/c IVF  Mildly elevated calcium- resolved   Code Status: full Family Communication: patient Disposition Plan: home in AM- needs to wean O2 and have PT eval   Consultants:    Procedures:    HPI/Subjective: Still with rib pain Doing incentive spirometry  Objective: Filed Vitals:   09/17/13 0525  BP: 161/87  Pulse: 70  Temp: 97.8 F (36.6 C)  Resp: 18    Intake/Output Summary (Last 24 hours) at 09/17/13 0917 Last data filed at 09/17/13 0848  Gross per 24 hour  Intake 1492.5 ml  Output      0 ml  Net 1492.5 ml   Filed Weights   09/16/13 1635 09/16/13 2210  Weight: 64.7 kg (142 lb 10.2 oz) 63.277 kg (139 lb 8 oz)    Exam:   General:  A+Ox3, NAD  Cardiovascular: rrr  Respiratory: not wanting to take full breaths, decreased  Abdomen: +Bs, soft  Musculoskeletal: moves all 4 ext  Data Reviewed: Basic Metabolic Panel:  Recent Labs Lab 09/16/13 1206 09/17/13 0520  NA 140 143  K 4.0 4.0  CL 99 107  CO2 24 22  GLUCOSE 99 87  BUN 15 14  CREATININE 0.87 0.92  CALCIUM 10.6* 9.6   Liver Function Tests: No results found for this basename: AST, ALT, ALKPHOS, BILITOT, PROT, ALBUMIN,  in the last 168 hours No results found for this basename: LIPASE, AMYLASE,  in the last 168 hours No results found for this basename: AMMONIA,  in the last 168  hours CBC:  Recent Labs Lab 09/16/13 1206 09/17/13 0520  WBC 7.5 5.7  NEUTROABS 5.7  --   HGB 13.9 12.0  HCT 41.6 36.3  MCV 96.1 95.0  PLT 171 171   Cardiac Enzymes: No results found for this basename: CKTOTAL, CKMB, CKMBINDEX, TROPONINI,  in the last 168 hours BNP (last 3 results) No results found for this basename: PROBNP,  in the last 8760 hours CBG: No results found for this basename: GLUCAP,  in the last 168 hours  No results found for this or any previous visit (from the past 240 hour(s)).   Studies: Dg Chest 2 View  09/16/2013   CLINICAL DATA:  80 year old female fall with rib fracture and increased shortness of Breath. Initial encounter.  EXAM: CHEST  2 VIEW  COMPARISON:  09/14/2013 and earlier.  FINDINGS: Mildly displaced fractures of the right lateral fifth -seventh ribs now more apparent, but stable. No associated pneumothorax. Trace right pleural effusion now visible. Otherwise stable lung parenchyma. No consolidation or edema. Normal cardiac size and mediastinal contours. Osteopenia. Other visualized osseous structures are stable.  IMPRESSION: 1. Trace right pleural effusion has developed in association with minimally to mildly displaced right lateral rib fractures (ribs 5, 6, 7). 2. No pneumothorax and otherwise stable chest.   Electronically Signed   By: Augusto Gamble M.D.   On: 09/16/2013 13:01    Scheduled Meds: . antiseptic  oral rinse  15 mL Mouth Rinse BID  . budesonide-formoterol  2 puff Inhalation BID  . enoxaparin (LOVENOX) injection  40 mg Subcutaneous Q24H  . ibuprofen  800 mg Oral TID WC  . losartan  50 mg Oral Daily  . pneumococcal 23 valent vaccine  0.5 mL Intramuscular Tomorrow-1000   Continuous Infusions: . sodium chloride 50 mL/hr at 09/16/13 1445   Antibiotics Given (last 72 hours)   None      Active Problems:   HYPERTENSION   Heart murmur   Hypoxia   Fall   Rib fracture    Time spent: 25 min    Marlin CanaryVANN, Aldean Pipe  [facility] Pager [phone]. If 7PM-7AM, please contact night-coverage at [email or web address], password [email or web address] 09/17/2013, 9:17 AM  LOS: 1 day

## 2013-09-17 NOTE — Progress Notes (Signed)
SpO2 on room air 89-90% at rest. 91-92% with 2LO2

## 2013-09-17 NOTE — Evaluation (Signed)
Physical Therapy Evaluation Patient Details Name: Karina Andrews [place] MRN: [id number] DOB: 11-Jul-1930 Today's Date: 09/17/2013   History of Present Illness  Patient is an 80 yo female admitted 09/16/13 with hypoxia, and Rt rib fractures.  Patient had fall 2 days pta.  PMH:  HTN, COPD, mult falls, Bil hip fx's, cervical surgery, depression, osteoporosis.  Clinical Impression  Patient presents with problems listed below.  Will benefit from acute PT to maximize independence prior to discharge.  Patient lives alone and declines SNF.  Reports her friend Karina Andrews will be staying with her for 4 days, and her daughter lives nearby.  Recommend HHPT to continue therapy for mobility/balance.    Follow Up Recommendations Home health PT;Supervision/Assistance - 24 hour (Patient refuses SNF)    Equipment Recommendations  None recommended by PT    Recommendations for Other Services       Precautions / Restrictions Precautions Precautions: Fall Restrictions Weight Bearing Restrictions: No      Mobility  Bed Mobility Overal bed mobility: Needs Assistance Bed Mobility: Rolling;Sidelying to Sit Rolling: Min guard Sidelying to sit: Min assist       General bed mobility comments: Verbal cues for technique.  Assist to raise trunk to sitting position.  Transfers Overall transfer level: Needs assistance Equipment used: Rolling walker (2 wheeled) Transfers: Sit to/from Stand Sit to Stand: Min assist         General transfer comment: Verbal cues for hand placement and safety.  Assist to rise to standing from bed and toilet, and for balance.  Ambulation/Gait Ambulation/Gait assistance: Min assist Ambulation Distance (Feet): 64 Feet Assistive device: Rolling walker (2 wheeled) Gait Pattern/deviations: Step-through pattern;Decreased stride length;Trunk flexed Gait velocity: Decreased Gait velocity interpretation: Below normal speed for age/gender General Gait Details: Verbal cues for safe use of  RW.  Cues to keep hands on RW - patient reaching for sink and foot of bed during gait.  Cues to stand upright and look forward during gait.  Stairs            Wheelchair Mobility    Modified Rankin (Stroke Patients Only)       Balance                                             Pertinent Vitals/Pain Pain 6/10 in ribs limiting mobility    Home Living Family/patient expects to be discharged to:: Private residence Living Arrangements: Alone Available Help at Discharge: Family;Friend(s);Available 24 hours/day (Friend staying with patient x4 days; daughter lives on block) Type of Home: House Home Access: Level entry     Home Layout: One level Home Equipment: Environmental consultantWalker - 2 wheels;Shower seat      Prior Function Level of Independence: Independent with assistive device(s)         Comments: Uses RW for ambulation.  Patient drives.  Independent with ADL's pta.     Hand Dominance        Extremity/Trunk Assessment   Upper Extremity Assessment: Overall WFL for tasks assessed           Lower Extremity Assessment: Generalized weakness      Cervical / Trunk Assessment: Other exceptions  Communication   Communication: Expressive difficulties  Cognition Arousal/Alertness: Awake/alert Behavior During Therapy: WFL for tasks assessed/performed Overall Cognitive Status: Within Functional Limits for tasks assessed  General Comments      Exercises        Assessment/Plan    PT Assessment Patient needs continued PT services  PT Diagnosis Difficulty walking;Generalized weakness;Acute pain   PT Problem List Decreased strength;Decreased activity tolerance;Decreased balance;Decreased mobility;Decreased knowledge of use of DME;Decreased safety awareness;Cardiopulmonary status limiting activity;Pain  PT Treatment Interventions DME instruction;Gait training;Functional mobility training;Therapeutic activities;Therapeutic  exercise;Patient/family education   PT Goals (Current goals can be found in the Care Plan section) Acute Rehab PT Goals Patient Stated Goal: To return home PT Goal Formulation: With patient Time For Goal Achievement: 09/24/13 Potential to Achieve Goals: Good    Frequency Min 3X/week   Barriers to discharge Decreased caregiver support Patient lives alone.  Reports daughter lives nearby, and her friend Karina Fife will be staying with her for 4 days.    Co-evaluation               End of Session Equipment Utilized During Treatment: Gait belt Activity Tolerance: Patient limited by pain;Patient limited by fatigue Patient left: in chair;with call bell/phone within reach Nurse Communication: Mobility status         Time: 1914-7829 PT Time Calculation (min): 27 min   Charges:   PT Evaluation $Initial PT Evaluation Tier I: 1 Procedure PT Treatments $Gait Training: 8-22 mins   PT G Codes:          Vena Austria 09/17/2013, 7:24 PM Durenda Hurt. Renaldo Fiddler, [facility] Acute Rehab Services Pager [phone]

## 2013-09-17 NOTE — Care Management Utilization Note (Signed)
UR completed.    Hosie Sharman Wise Cassadi Purdie, RN, BSN Phone #[phone]  

## 2013-09-17 NOTE — Progress Notes (Signed)
Pt ambulatory to bathroom with rolling walker. Pt wearing home supply depends/poise pads. Reminded pt that we do not recommend using these on the unit d/t skin breakdown, pt verbalizes understanding however continues to wear them. Pt tested on room air while walking to bathroom. SpO2 on 2L at rest 96%. Ambulated to bathroom and back to bed (approx 5 min). Recheck SpO2 on room air 82-85%. Replaced O2, SpO2 returned to 93% at 2L. Will continue to monitor.

## 2013-09-18 ENCOUNTER — Inpatient Hospital Stay (HOSPITAL_COMMUNITY): Payer: Medicare PPO

## 2013-09-18 DIAGNOSIS — F17200 Nicotine dependence, unspecified, uncomplicated: Secondary | ICD-10-CM

## 2013-09-18 MED ORDER — ALBUTEROL SULFATE (2.5 MG/3ML) 0.083% IN NEBU
2.5000 mg | INHALATION_SOLUTION | Freq: Two times a day (BID) | RESPIRATORY_TRACT | Status: DC
  Administered 2013-09-18 – 2013-09-19 (×2): 2.5 mg via RESPIRATORY_TRACT
  Filled 2013-09-18 (×2): qty 3

## 2013-09-18 MED ORDER — ALBUTEROL SULFATE (2.5 MG/3ML) 0.083% IN NEBU
2.5000 mg | INHALATION_SOLUTION | Freq: Four times a day (QID) | RESPIRATORY_TRACT | Status: DC
  Administered 2013-09-18: 2.5 mg via RESPIRATORY_TRACT
  Filled 2013-09-18: qty 3

## 2013-09-18 MED ORDER — PREDNISONE 20 MG PO TABS
40.0000 mg | ORAL_TABLET | Freq: Every day | ORAL | Status: DC
  Administered 2013-09-18 – 2013-09-19 (×2): 40 mg via ORAL
  Filled 2013-09-18 (×3): qty 2

## 2013-09-18 NOTE — Progress Notes (Signed)
PROGRESS NOTE  Karina Andrews NFA:[id number] DOB: [birth year] DOA: 09/16/2013 PCP: Karina PaciValerie Leschber, MD  Assessment/Plan: Acute hypoxia- recent fall with rib fractures- patient has not been doing incentive spirometry secondary to pain- x ray today shows: Trace right pleural effusion has developed in association with minimally to mildly displaced right lateral rib fractures (ribs 5, 6, 7). Patient does have COPD but is not currently wheezing  -Incentive spirometry -flutter valve  Emphysema/COPD -prednisone -nebs -needs to quit smoking  Fall- PT Eval, patient lives alone- has had falls in the past- refusing SNF- family to arrange care givers, home health ordered   Rib fracture- pulm toilet and IS   Mild dehydration- d/c IVF  Mildly elevated calcium- resolved   Code Status: full Family Communication: patient Disposition Plan: home in AM- may need O2 at home   Consultants:    Procedures:    HPI/Subjective: Pain improving  Objective: Filed Vitals:   09/18/13 0452  BP: 141/69  Pulse: 74  Temp: 97.8 F (36.6 C)  Resp: 16    Intake/Output Summary (Last 24 hours) at 09/18/13 0906 Last data filed at 09/18/13 [id number]  Gross per 24 hour  Intake   2340 ml  Output      0 ml  Net   2340 ml   Filed Weights   09/16/13 1635 09/16/13 2210 09/17/13 1953  Weight: 64.7 kg (142 lb 10.2 oz) 63.277 kg (139 lb 8 oz) 64.275 kg (141 lb 11.2 oz)    Exam:   General:  A+Ox3, NAD  Cardiovascular: rrr  Respiratory: not wanting to take full breaths, decreased intake, no wheezing  Abdomen: +Bs, soft  Musculoskeletal: moves all 4 ext  Data Reviewed: Basic Metabolic Panel:  Recent Labs Lab 09/16/13 1206 09/17/13 0520  NA 140 143  K 4.0 4.0  CL 99 107  CO2 24 22  GLUCOSE 99 87  BUN 15 14  CREATININE 0.87 0.92  CALCIUM 10.6* 9.6   Liver Function Tests: No results found for this basename: AST, ALT, ALKPHOS, BILITOT, PROT, ALBUMIN,  in the last 168 hours No results  found for this basename: LIPASE, AMYLASE,  in the last 168 hours No results found for this basename: AMMONIA,  in the last 168 hours CBC:  Recent Labs Lab 09/16/13 1206 09/17/13 0520  WBC 7.5 5.7  NEUTROABS 5.7  --   HGB 13.9 12.0  HCT 41.6 36.3  MCV 96.1 95.0  PLT 171 171   Cardiac Enzymes: No results found for this basename: CKTOTAL, CKMB, CKMBINDEX, TROPONINI,  in the last 168 hours BNP (last 3 results) No results found for this basename: PROBNP,  in the last 8760 hours CBG: No results found for this basename: GLUCAP,  in the last 168 hours  No results found for this or any previous visit (from the past 240 hour(s)).   Studies: Dg Chest 2 View  09/18/2013   CLINICAL DATA:  Pain post trauma  EXAM: CHEST  2 VIEW  COMPARISON:  September 16, 2013  FINDINGS: There is underlying emphysematous change. There is Mild scarring in both lungs. No edema or consolidation. Heart size and pulmonary vascularity are normal. There are multiple rib fractures on the right, stable. No pneumothorax apparent. No adenopathy. There is postoperative change in the lower cervical spine.  IMPRESSION: Rib fractures on the right again noted without apparent pneumothorax. Underlying emphysema with scattered areas of mild lung scarring. No edema or consolidation.   Electronically Signed   By: Bretta BangWilliam  Andrews M.D.  On: 09/18/2013 07:42   Dg Chest 2 View  09/16/2013   CLINICAL DATA:  80 year old female fall with rib fracture and increased shortness of Breath. Initial encounter.  EXAM: CHEST  2 VIEW  COMPARISON:  09/14/2013 and earlier.  FINDINGS: Mildly displaced fractures of the right lateral fifth -seventh ribs now more apparent, but stable. No associated pneumothorax. Trace right pleural effusion now visible. Otherwise stable lung parenchyma. No consolidation or edema. Normal cardiac size and mediastinal contours. Osteopenia. Other visualized osseous structures are stable.  IMPRESSION: 1. Trace right pleural effusion  has developed in association with minimally to mildly displaced right lateral rib fractures (ribs 5, 6, 7). 2. No pneumothorax and otherwise stable chest.   Electronically Signed   By: Karina Andrews M.D.   On: 09/16/2013 13:01    Scheduled Meds: . albuterol  2.5 mg Inhalation Q6H  . antiseptic oral rinse  15 mL Mouth Rinse BID  . budesonide-formoterol  2 puff Inhalation BID  . enoxaparin (LOVENOX) injection  40 mg Subcutaneous Q24H  . ibuprofen  800 mg Oral TID WC  . lidocaine  1 patch Transdermal Q24H  . losartan  50 mg Oral Daily  . predniSONE  40 mg Oral Q breakfast   Continuous Infusions:   Antibiotics Given (last 72 hours)   None      Active Problems:   HYPERTENSION   Heart murmur   Hypoxia   Fall   Rib fracture   Acute respiratory failure    Time spent: 25 min    Karina Andrews  [facility] Pager [phone]. If 7PM-7AM, please contact night-coverage at [email or web address], password [facility] 09/18/2013, 9:06 AM  LOS: 2 days

## 2013-09-18 NOTE — Progress Notes (Signed)
Pt continues to desaturate with activity, SpO2 with activity on 2L 89%. SpO2 without O2/on room air 83%. Pt compliant today with flutter valve and IS, states she does "not want to croak because of pneumonia." Encouraged ambulation and activity. Added tubing extention so pt can get to bathroom on O2. Encouraged turn/cough/deep breath with pillow support to right rib cage. Pt educated to quit smoking and discussed possibility of needing to go home on O2. Pt states she does not intend on quitting smoking, educated patient and family on dangers of smoking while wearing oxygen. Pt verbalized understanding. Will continue to monitor.

## 2013-09-19 DIAGNOSIS — J441 Chronic obstructive pulmonary disease with (acute) exacerbation: Secondary | ICD-10-CM

## 2013-09-19 MED ORDER — OXYCODONE-ACETAMINOPHEN 5-325 MG PO TABS: 1.0000 | ORAL_TABLET | Freq: Four times a day (QID) | ORAL | Status: DC | PRN

## 2013-09-19 MED ORDER — ALBUTEROL SULFATE (2.5 MG/3ML) 0.083% IN NEBU: 2.5000 mg | INHALATION_SOLUTION | Freq: Two times a day (BID) | RESPIRATORY_TRACT | Status: DC

## 2013-09-19 MED ORDER — PREDNISONE 20 MG PO TABS: 20.0000 mg | ORAL_TABLET | Freq: Every day | ORAL | Status: DC

## 2013-09-19 MED ORDER — NICOTINE 14 MG/24HR TD PT24: 14.0000 mg | MEDICATED_PATCH | Freq: Every day | TRANSDERMAL | Status: DC

## 2013-09-19 MED ORDER — CALCIUM CARBONATE ANTACID 500 MG PO CHEW
1.0000 | CHEWABLE_TABLET | Freq: Two times a day (BID) | ORAL | Status: DC
  Filled 2013-09-19 (×2): qty 1

## 2013-09-19 NOTE — Progress Notes (Signed)
Patient Discharge:  Disposition: Pt discharged home  Education: Educated pt on diagnosis, medications, discharge instructions, follow up appointments and equipment. Pt verbalized understanding of teaching.  IV: D/C  Follow-up appointments: Made follow up appointment with patient's PCP in 1 week  Prescriptions: Scripts given to patient   Transportation: Caregiver transported pt home in car  Belongings: Dentures, jewelry, cell phone, clothing, and all other belongings taken with pt.

## 2013-09-19 NOTE — Discharge Summary (Signed)
Physician Discharge Summary  Karina Andrews:[id number] DOB: [birth year] DOA: 09/16/2013  PCP: Rene Paci, MD  Admit date: 09/16/2013 Discharge date: 09/19/2013  Time spent: 35 minutes  Recommendations for Outpatient Follow-up:  Home health 24 hour care O2 Neb machine ? ENT eval for voice change-- current smoker   Discharge Diagnoses:  Active Problems:   HYPERTENSION   Heart murmur   Hypoxia   Fall   Rib fracture   Acute respiratory failure   COPD exacerbation   Discharge Condition: improved  Diet recommendation: cardiac  Filed Weights   09/16/13 2210 09/17/13 1953 09/18/13 2005  Weight: 63.277 kg (139 lb 8 oz) 64.275 kg (141 lb 11.2 oz) 64.411 kg (142 lb)    History of present illness:  Karina Andrews is a 80 y.o. female  Who lives home alone but daughter checks in on her. She has HTN, COPD. She gets around with a walker. 2 days ago, she fell, was seen in the ER with xrays and sent home with an incentive spirometry and pain meds. She was unable to do the incentive spirometry secondary to pain. She presents today with HTN and concerns from the daughter. Upon eval in ER, patient was found to be hypoxic in the 80s on RA- given 2L o2 and now 95%.  A repeat x ray was done that showed: Trace right pleural effusion has developed in association with minimally to mildly displaced right lateral rib fractures (ribs 5, 6, 7).  No fever, no chills  Per daughter patient has had multiple falls in the years past. Voice has been changing over the last few years (started after a fall), worsening in the last 6 weeks.   Hospital Course:  Acute hypoxia- recent fall with rib fractures- patient has not been doing incentive spirometry secondary to pain- x ray today shows: Trace right pleural effusion has developed in association with minimally to mildly displaced right lateral rib fractures (ribs 5, 6, 7). -Incentive spirometry  -flutter valve  Emphysema/COPD- patient continues to  smoke -prednisone  -nebs  -needs to quit smoking   Fall- PT Eval, patient lives alone- has had falls in the past- refusing SNF- family to arrange care givers, home health ordered   Rib fracture- pulm toilet and IS   Mild dehydration- d/c IVF   Mildly elevated calcium- resolved   Procedures:    Consultations:    Discharge Exam: Filed Vitals:   09/19/13 0602  BP: 144/68  Pulse: 79  Temp: 97.8 F (36.6 C)  Resp: 18    General: A+ Ox3, NAD Cardiovascular: rrr Respiratory: decreased entry  Discharge Instructions You were cared for by a hospitalist during your hospital stay. If you have any questions about your discharge medications or the care you received while you were in the hospital after you are discharged, you can call the unit and asked to speak with the hospitalist on call if the hospitalist that took care of you is not available. Once you are discharged, your primary care physician will handle any further medical issues. Please note that NO REFILLS for any discharge medications will be authorized once you are discharged, as it is imperative that you return to your primary care physician (or establish a relationship with a primary care physician if you do not have one) for your aftercare needs so that they can reassess your need for medications and monitor your lab values.      Discharge Instructions   Diet general    Complete by:  As directed      Discharge instructions    Complete by:  As directed   24 hour care Home health PT Stop smoking     Increase activity slowly    Complete by:  As directed             Medication List    STOP taking these medications       albuterol 108 (90 BASE) MCG/ACT inhaler  Commonly known as:  PROVENTIL HFA;VENTOLIN HFA  Replaced by:  albuterol (2.5 MG/3ML) 0.083% nebulizer solution      TAKE these medications       albuterol 108 (90 BASE) MCG/ACT inhaler  Commonly known as:  PROVENTIL HFA  Inhale 2 puffs into the  lungs every 6 (six) hours as needed for wheezing or shortness of breath.     albuterol (2.5 MG/3ML) 0.083% nebulizer solution  Commonly known as:  PROVENTIL  Inhale 3 mLs (2.5 mg total) into the lungs 2 (two) times daily.     budesonide-formoterol 160-4.5 MCG/ACT inhaler  Commonly known as:  SYMBICORT  Inhale 2 puffs into the lungs 2 (two) times daily.     losartan 50 MG tablet  Commonly known as:  COZAAR  Take 1 tablet (50 mg total) by mouth daily.     nicotine 14 mg/24hr patch  Commonly known as:  NICODERM CQ - dosed in mg/24 hours  Place 1 patch (14 mg total) onto the skin daily.     oxyCODONE-acetaminophen 5-325 MG per tablet  Commonly known as:  PERCOCET/ROXICET  Take 1 tablet by mouth every 6 (six) hours as needed for severe pain.     predniSONE 20 MG tablet  Commonly known as:  DELTASONE  Take 1 tablet (20 mg total) by mouth daily with breakfast. 40 mg x 1 days, 30 mg x 2 days, 20 mg x 2 days, 10 mg x 2 days and then stop     PROLIA 60 MG/ML Soln injection  Generic drug:  denosumab  Inject 60 mg into the skin every 6 (six) months.     temazepam 15 MG capsule  Commonly known as:  RESTORIL  Take 15 mg by mouth at bedtime as needed for sleep.       No Known Allergies Follow-up Information   Follow up with Rene PaciValerie Leschber, MD In 1 week.   Specialty:  Internal Medicine   Contact information:   [address] [place] [address] [phone]        The results of significant diagnostics from this hospitalization (including imaging, microbiology, ancillary and laboratory) are listed below for reference.    Significant Diagnostic Studies: Dg Chest 2 View  09/18/2013   CLINICAL DATA:  Pain post trauma  EXAM: CHEST  2 VIEW  COMPARISON:  September 16, 2013  FINDINGS: There is underlying emphysematous change. There is Mild scarring in both lungs. No edema or consolidation. Heart size and pulmonary vascularity are normal. There are multiple rib  fractures on the right, stable. No pneumothorax apparent. No adenopathy. There is postoperative change in the lower cervical spine.  IMPRESSION: Rib fractures on the right again noted without apparent pneumothorax. Underlying emphysema with scattered areas of mild lung scarring. No edema or consolidation.   Electronically Signed   By: Bretta BangWilliam  Woodruff M.D.   On: 09/18/2013 07:42   Dg Chest 2 View  09/16/2013   CLINICAL DATA:  80 year old female fall with rib fracture and increased shortness of Breath.  Initial encounter.  EXAM: CHEST  2 VIEW  COMPARISON:  09/14/2013 and earlier.  FINDINGS: Mildly displaced fractures of the right lateral fifth -seventh ribs now more apparent, but stable. No associated pneumothorax. Trace right pleural effusion now visible. Otherwise stable lung parenchyma. No consolidation or edema. Normal cardiac size and mediastinal contours. Osteopenia. Other visualized osseous structures are stable.  IMPRESSION: 1. Trace right pleural effusion has developed in association with minimally to mildly displaced right lateral rib fractures (ribs 5, 6, 7). 2. No pneumothorax and otherwise stable chest.   Electronically Signed   By: Augusto Gamble M.D.   On: 09/16/2013 13:01   Dg Chest 2 View  09/14/2013   CLINICAL DATA:  Fall.  EXAM: CHEST  2 VIEW  COMPARISON:  Chest radiograph April 20, 2012  FINDINGS: Cardiac silhouette is unremarkable and unchanged. Mildly calcified aortic knob. Similar mild chronic interstitial changes without pleural effusions or focal consolidations. No pneumothorax.  Mildly displaced right lateral fifth and sixth rib fractures. ACDF. Stable mild approximate L2 vertebral body wedging. Surgical clips in the abdomen may reflect cholecystectomy.  IMPRESSION: Mildly displaced right lateral fifth and sixth rib fractures without pneumothorax nor acute cardiopulmonary process.  Mild chronic interstitial changes.   Electronically Signed   By: Awilda Metro   On: 09/14/2013 04:29    Dg Pelvis 1-2 Views  09/14/2013   CLINICAL DATA:  Fall.  EXAM: PELVIS - 1-2 VIEW  COMPARISON:  Pelvic radiograph December 06, 2009  FINDINGS: No acute fracture deformity or dislocation. Status post bilateral proximal femur ORIF, imaged hardware appears intact without periprosthetic lucency. Bone mineral density is decreased without destructive bony lesions. Phleboliths in the pelvis. Mild vascular calcifications. Soft tissue planes are nonsuspicious.  IMPRESSION: No acute fracture deformity or dislocation.  Status post bilateral proximal femur ORIF, incompletely characterized.   Electronically Signed   By: Awilda Metro   On: 09/14/2013 04:31    Microbiology: No results found for this or any previous visit (from the past 240 hour(s)).   Labs: Basic Metabolic Panel:  Recent Labs Lab 09/16/13 1206 09/17/13 0520  NA 140 143  K 4.0 4.0  CL 99 107  CO2 24 22  GLUCOSE 99 87  BUN 15 14  CREATININE 0.87 0.92  CALCIUM 10.6* 9.6   Liver Function Tests: No results found for this basename: AST, ALT, ALKPHOS, BILITOT, PROT, ALBUMIN,  in the last 168 hours No results found for this basename: LIPASE, AMYLASE,  in the last 168 hours No results found for this basename: AMMONIA,  in the last 168 hours CBC:  Recent Labs Lab 09/16/13 1206 09/17/13 0520  WBC 7.5 5.7  NEUTROABS 5.7  --   HGB 13.9 12.0  HCT 41.6 36.3  MCV 96.1 95.0  PLT 171 171   Cardiac Enzymes: No results found for this basename: CKTOTAL, CKMB, CKMBINDEX, TROPONINI,  in the last 168 hours BNP: BNP (last 3 results) No results found for this basename: PROBNP,  in the last 8760 hours CBG: No results found for this basename: GLUCAP,  in the last 168 hours     Signed:  Benjamine Mola, Hoang Pettingill  [facility] 09/19/2013, 8:07 AM

## 2013-09-19 NOTE — Progress Notes (Signed)
CARE MANAGEMENT NOTE 09/19/2013  Patient:  Harden MoWINSLOW,Lenzie L   Account Number:  [id number]  Date Initiated:  09/19/2013  Documentation initiated by:  [facility],Thula Stewart  Subjective/Objective Assessment:   COPD exac     Action/Plan:   HH   Anticipated DC Date:  09/19/2013   Anticipated DC Plan:  HOME W HOME HEALTH SERVICES      DC Planning Services  CM consult      [facility]   Choice offered to / List presented to:  C-2 HC POA / Guardian   DME arranged  OXYGEN  NEBULIZER MACHINE      DME agency  [facility]     HH arranged  HH-2 PT  HH-3 OT      [facility]  [facility]   Status of service:  Completed, signed off Medicare Important Message given?  YES (If response is "NO", the following Medicare IM given date fields will be blank) Date Medicare IM given:  09/19/2013 Medicare IM given by:  [facility],Bond Grieshop Date Additional Medicare IM given:   Additional Medicare IM given by:    Discharge Disposition:  HOME W HOME HEALTH SERVICES  Per UR Regulation:    If discussed at Long Length of Stay Meetings, dates discussed:    Comments:  09/19/2013 1130 NCM spoke to pt and gave permission to speak to Caregiver, Alan RipperLynn Hamm [phone] in the room. [facility]. Faxed orders, H&P, and [facility] and nebulizer machine. Contacted [facility] and they will deliver to room today. Isidoro DonningAlesia Meiko Ives RN CCM Case Mgmt phone [phone]

## 2013-09-19 NOTE — Progress Notes (Signed)
SATURATION QUALIFICATIONS: (This note is used to comply with regulatory documentation for home oxygen)  Patient Saturations on Room Air at Rest = 91%  Patient Saturations on Room Air while Ambulating = 77%  Patient Saturations on 2 Liters of oxygen while Ambulating = 92%  Please briefly explain why patient needs home oxygen: oxygen saturation decrease on ambulation and exertion

## 2013-09-19 NOTE — Progress Notes (Signed)
Physical Therapy Treatment Patient Details Name: Karina Andrews MRN: [id number] DOB: Feb 26, 1930 Today's Date: 09/19/2013    History of Present Illness Patient is an 80 yo female admitted 09/16/13 with hypoxia, and Rt rib fractures.  Patient had fall 2 days pta.  PMH:  HTN, COPD, mult falls, Bil hip fx's, cervical surgery, depression, osteoporosis.    PT Comments    Making improvements with mobility and activity tolerance; Caregiver, Larita Fife present and very helpful  Follow Up Recommendations  Home health PT;Supervision/Assistance - 24 hour     Equipment Recommendations  None recommended by PT    Recommendations for Other Services       Precautions / Restrictions Precautions Precautions: Fall    Mobility  Bed Mobility               General bed mobility comments: sitting EOB upon arrival  Transfers Overall transfer level: Needs assistance Equipment used: Rolling walker (2 wheeled) Transfers: Sit to/from Stand Sit to Stand: Min guard (with and without physical contact)         General transfer comment: Verbal cues for hand placement and safety.   Ambulation/Gait Ambulation/Gait assistance: Min guard (with and without physical contact) Ambulation Distance (Feet): 120 Feet Assistive device: Rolling walker (2 wheeled) Gait Pattern/deviations: Shuffle Gait velocity: Decreased   General Gait Details: Cues for RW proximity and posture; also cues to perform deep, focused breathing   Stairs            Wheelchair Mobility    Modified Rankin (Stroke Patients Only)       Balance             Standing balance-Leahy Scale: Fair                      Cognition Arousal/Alertness: Awake/alert Behavior During Therapy: WFL for tasks assessed/performed Overall Cognitive Status: Within Functional Limits for tasks assessed                      Exercises      General Comments General comments (skin integrity, edema, etc.): Pt educated in  splinting ribs with a pillow to help with deep breathing, and coughing      Pertinent Vitals/Pain Session conducted on room air; O2 sats remained greater than or equal to 91% Rib pain ramps up to 8/10 with cough, subsides relatively quickly    Home Living                      Prior Function            PT Goals (current goals can now be found in the care plan section) Acute Rehab PT Goals Patient Stated Goal: To return home PT Goal Formulation: With patient Time For Goal Achievement: 09/24/13 Potential to Achieve Goals: Good Progress towards PT goals: Progressing toward goals    Frequency  Min 3X/week    PT Plan Current plan remains appropriate (continues to refuse SNF)    Co-evaluation             End of Session Equipment Utilized During Treatment: Gait belt Activity Tolerance: Patient tolerated treatment well Patient left: in chair;with family/visitor present;Other (comment) (talking with case mgr)     Time: 1191-4782 PT Time Calculation (min): 23 min  Charges:  $Gait Training: 8-22 mins $Therapeutic Activity: 8-22 mins                    G  Codes:      Van ClinesGarrigan, Zillah Alexie [facility] 09/19/2013, 1:15 PM  Van ClinesHolly Kengo Sturges, [place]  Acute Rehabilitation Services Pager [phone] Office [phone]

## 2013-09-21 ENCOUNTER — Telehealth: Payer: Self-pay | Admitting: Internal Medicine

## 2013-09-21 NOTE — Telephone Encounter (Signed)
Karina Andrews with [facility] is calling for a verbal authorization to continue PT,  2x a week for 4 weeks. Please call back to advise. Ph#: [phone]

## 2013-09-21 NOTE — Telephone Encounter (Signed)
LVM with Onalee Huaavid about verbal ok from MD to do PT 2 x week for 4 weeks.

## 2013-09-21 NOTE — Telephone Encounter (Signed)
Verbal ok?

## 2013-09-21 NOTE — Telephone Encounter (Signed)
LVM for Onalee HuaDavid to return my call.   Onalee HuaDavid needs verbal orders to continue PT 2 x week for the next 4 weeks.

## 2013-09-26 ENCOUNTER — Encounter: Payer: Self-pay | Admitting: Internal Medicine

## 2013-09-26 ENCOUNTER — Telehealth: Payer: Self-pay | Admitting: Internal Medicine

## 2013-09-26 ENCOUNTER — Ambulatory Visit (INDEPENDENT_AMBULATORY_CARE_PROVIDER_SITE_OTHER): Payer: Medicare PPO | Admitting: Internal Medicine

## 2013-09-26 VITALS — BP 152/70 | HR 104 | Temp 98.1°F | Wt 140.8 lb

## 2013-09-26 DIAGNOSIS — S2231XK Fracture of one rib, right side, subsequent encounter for fracture with nonunion: Secondary | ICD-10-CM

## 2013-09-26 DIAGNOSIS — J449 Chronic obstructive pulmonary disease, unspecified: Secondary | ICD-10-CM

## 2013-09-26 DIAGNOSIS — IMO0002 Reserved for concepts with insufficient information to code with codable children: Secondary | ICD-10-CM

## 2013-09-26 MED ORDER — TRAMADOL HCL 50 MG PO TABS: 50.0000 mg | ORAL_TABLET | Freq: Three times a day (TID) | ORAL | Status: DC | PRN

## 2013-09-26 NOTE — Patient Instructions (Addendum)
Please consider using the E cigarette as we discussed. This could possibly decrease inflammation of the airways & prevent drop in oxygen levels. Nonsteroidal anti-inflammatory agents such as Indocin and should be taken as infrequently as possible & only with food in  the stomach. These are associated with an increased risk of gastric bleeding and cardiac disease if taken on a regular basis.

## 2013-09-26 NOTE — Progress Notes (Signed)
Pre visit review using our clinic review tool, if applicable. No additional management support is needed unless otherwise documented below in the visit note. 

## 2013-09-26 NOTE — Telephone Encounter (Signed)
Relevant patient education mailed to patient.  

## 2013-09-26 NOTE — Progress Notes (Signed)
   Subjective:    Patient ID: Karina Andrews, female    DOB: [birth year], 80 y.o.   MRN: [id number]  HPI   She was hospitalized 7/24-7/27/15 to treat uncontrolled pain in the setting of multiple mildly displaced 5  R rib fractures associated with trace pleural effusion. Those records were reviewed  The fractures occurred when she fell  09/14/13 in a mechanical fall , having tripped over a transom.No neurocardiac prodrome reported. She was seen in the emergency room on 7/22 but after discharge was unable to perform incentive spirometry @ home because of pain.  She stays by herself & has refused skilled nurse facility placement  She continues to smoke 3-5 cig/ day.  She was discharged with cardiac diet; nebulized treatments;PT/OT,HHN and pain medications.On nocturnal O2 @ 2 L/minute with O2 sats 93-96% overnight.  Labs revealed a GFR 57; other labs were normal.   Review of Systems  Denied were any change in heart rhythm or rate prior to the event. There was no associated chest pain or shortness of breath .  Also specifically denied prior to the episode were headache, limb weakness, tingling, or numbness. No seizure activity noted.      Objective:   Physical Exam Pertinent or significant findings include: She has complete dentures. Her speech pattern is a stacato, sharply punctuated format. Breath sounds are markedly decreased in all lung fields.. Faint rales noted right lower lobe She has an S4 gallop with a grade 1 systolic murmur. Recheck P 90. Clubbing of the nailbeds is present. There is accentuated curvature of the upper thoracic spine. Her gait is choppy and unsteady even using a rolling walker.    General appearance :adequately nourished; in no distress. Eyes: No conjunctival inflammation or scleral icterus is present. Oral exam:  Lips and gums are healthy appearing.There is no oropharyngeal erythema or exudate noted.  Lungs: no wheezes, rhonchi,or rubs present.No  increased work of breathing.  Abdomen: bowel sounds normal, soft and non-tender without masses, organomegaly or hernias noted.  No guarding or rebound.  Skin:Warm & dry.  Intact without suspicious lesions or rashes ; no jaundice or tenting Lymphatic: No lymphadenopathy is noted about the head, neck, axilla             Assessment & Plan:  #1 status post multiple fractures of the right thorax; clinically stable. This was sustained a mechanical ,non-neurologic or Cardiologic prodrome related process.Ibuprofen 800 mg tid requested as refill.  #2 emphysema, advanced.  #3 active smoker; Chantix Rx mentioned by her  Plan: I am concerned about high-dose ibuprofen with cardiac and GI risk. I would recommend low-dose tramadol in its place.  The E cigarette was discussed as an alternative to the smoking. Chantix would be worrisome for possible mental status change.

## 2013-09-26 NOTE — Progress Notes (Signed)
   Subjective:    Patient ID: Karina Andrews, female    DOB: Sep 28, 1930, 80 y.o.   MRN: [id number]  HPI Patient was admitted to the hospital 09/16/13 and d/c'd 09/19/13. She was there for hypoxia and rib fx's bilaterally related to a fall.   She was given home health/PT/OT referrals and each of those have been enacted. She is compliant with breathing exercises.  She has a CNA caregiver, [name], that lives with her until 10/02/13   Review of Systems     Objective:   Physical Exam        Assessment & Plan:

## 2013-09-27 ENCOUNTER — Telehealth: Payer: Self-pay

## 2013-09-27 NOTE — Telephone Encounter (Signed)
Spoke to Nash-Finch CompanyLynn. [name] was with pt yesterday at the appointment with Dr. Alwyn RenHopper. [name] brought up that the pt has not heard or had her ECHO done. [place] was able to get the ECHO approved and scheduled for 09/29/2013. [place] called to inform the pt that the appoinment was set when [place] questioned the DNR and no detailed messages to be left.   I called to inform [name] that we do not have a DNR and that I will send the needed paperwork to complete and file pt wishes.

## 2013-09-28 DIAGNOSIS — J441 Chronic obstructive pulmonary disease with (acute) exacerbation: Secondary | ICD-10-CM

## 2013-09-28 DIAGNOSIS — IMO0001 Reserved for inherently not codable concepts without codable children: Secondary | ICD-10-CM

## 2013-09-28 DIAGNOSIS — J90 Pleural effusion, not elsewhere classified: Secondary | ICD-10-CM

## 2013-09-28 DIAGNOSIS — I10 Essential (primary) hypertension: Secondary | ICD-10-CM

## 2013-09-29 ENCOUNTER — Ambulatory Visit (HOSPITAL_COMMUNITY)
Admission: RE | Admit: 2013-09-29 | Discharge: 2013-09-29 | Disposition: A | Discharge status: Home / Self Care | Payer: Medicare PPO | Source: Ambulatory Visit | Attending: Cardiovascular Disease | Admitting: Cardiovascular Disease

## 2013-09-29 DIAGNOSIS — R011 Cardiac murmur, unspecified: Secondary | ICD-10-CM | POA: Diagnosis not present

## 2013-09-29 DIAGNOSIS — I359 Nonrheumatic aortic valve disorder, unspecified: Secondary | ICD-10-CM

## 2013-09-29 NOTE — Progress Notes (Signed)
2D Echocardiogram Complete.  09/29/2013   Teion Ballin, RDCS  

## 2013-10-02 ENCOUNTER — Encounter: Payer: Self-pay | Admitting: Internal Medicine

## 2013-10-13 ENCOUNTER — Telehealth: Payer: Self-pay | Admitting: *Deleted

## 2013-10-13 NOTE — Telephone Encounter (Signed)
Yes , please enter order. Thanks

## 2013-10-13 NOTE — Telephone Encounter (Signed)
Left msg on triage stating been seeing pt to help with her strength & gait disturbance. Wanting to ger verbal order to continue seeing pt for 2x's week for 2 more weeks. Is this ok...Raechel Chute/lmb

## 2013-10-14 NOTE — Telephone Encounter (Signed)
Notified Davis with md response...Raechel Chute/lmb

## 2013-10-25 ENCOUNTER — Telehealth: Payer: Self-pay | Admitting: Internal Medicine

## 2013-10-25 NOTE — Telephone Encounter (Signed)
MD out of office this week pls advise.../lmb 

## 2013-10-25 NOTE — Telephone Encounter (Signed)
[name] called in [facility] requesting Home Health OT for this pt.  Her number is [phone].  Thank you!!!

## 2013-10-25 NOTE — Telephone Encounter (Signed)
Called susan back no answer LMOM with md response...Karina Andrews

## 2013-10-25 NOTE — Telephone Encounter (Signed)
Ok but what is supporting Dx?

## 2013-11-08 ENCOUNTER — Telehealth: Payer: Self-pay

## 2013-11-08 NOTE — Telephone Encounter (Signed)
Verbal ok.  thx

## 2013-11-08 NOTE — Telephone Encounter (Signed)
Darl Pikes called for verbal orders of one more day of occupational therapy for pt.

## 2013-11-23 ENCOUNTER — Other Ambulatory Visit: Payer: Self-pay

## 2013-11-23 ENCOUNTER — Encounter: Payer: Self-pay | Admitting: Internal Medicine

## 2013-11-23 ENCOUNTER — Ambulatory Visit (INDEPENDENT_AMBULATORY_CARE_PROVIDER_SITE_OTHER): Payer: Medicare PPO | Admitting: Internal Medicine

## 2013-11-23 VITALS — BP 128/82 | HR 114 | Temp 97.5°F | Wt 135.5 lb

## 2013-11-23 DIAGNOSIS — G4700 Insomnia, unspecified: Secondary | ICD-10-CM

## 2013-11-23 DIAGNOSIS — I359 Nonrheumatic aortic valve disorder, unspecified: Secondary | ICD-10-CM

## 2013-11-23 DIAGNOSIS — J449 Chronic obstructive pulmonary disease, unspecified: Secondary | ICD-10-CM

## 2013-11-23 DIAGNOSIS — I350 Nonrheumatic aortic (valve) stenosis: Secondary | ICD-10-CM

## 2013-11-23 DIAGNOSIS — I10 Essential (primary) hypertension: Secondary | ICD-10-CM

## 2013-11-23 MED ORDER — VARENICLINE TARTRATE 1 MG PO TABS: 1.0000 mg | ORAL_TABLET | Freq: Two times a day (BID) | ORAL | Status: DC

## 2013-11-23 MED ORDER — TEMAZEPAM 7.5 MG PO CAPS: 7.5000 mg | ORAL_CAPSULE | Freq: Every evening | ORAL | Status: DC | PRN

## 2013-11-23 NOTE — Progress Notes (Signed)
Subjective:    Patient ID: Karina Andrews, female    DOB: 16-Jan-1931, 80 y.o.   MRN: [id number]  HPI  Patient is here for follow up  Reviewed chronic medical issues and interval medical events  Past Medical History  Diagnosis Date  . Hypertension   . Depression   . Osteoporosis     (R) hip fx 11/2009 & (L) hip fx 2006  . COPD (chronic obstructive pulmonary disease)   . Cervical spondylosis with myelopathy 01/2009    s/p decompression    Review of Systems  Constitutional: Positive for fatigue. Negative for fever and unexpected weight change.  Respiratory: Positive for cough (improved). Negative for chest tightness and shortness of breath.   Cardiovascular: Negative for chest pain (resolved fx pain) and leg swelling.  Genitourinary: Negative for menstrual problem.       Objective:   Physical Exam  BP 128/82  Pulse 114  Temp(Src) 97.5 F (36.4 C) (Oral)  Wt 135 lb 8 oz (61.462 kg)  SpO2 93% Wt Readings from Last 3 Encounters:  11/23/13 135 lb 8 oz (61.462 kg)  09/26/13 140 lb 12 oz (63.844 kg)  09/18/13 142 lb (64.411 kg)   Constitutional: She appears well-developed and well-nourished. No distress.  Family friend at side Neck: Normal range of motion. Neck supple. No JVD present. No thyromegaly present.  Cardiovascular: Normal rate, regular rhythm and normal heart sounds.  No murmur heard. No BLE edema. Pulmonary/Chest: Effort normal and breath sounds normal. No respiratory distress. She has no wheezes.  Psychiatric: She has a normal mood and affect. Her behavior is normal. Judgment and thought content normal.   Lab Results  Component Value Date   WBC 5.7 09/17/2013   HGB 12.0 09/17/2013   HCT 36.3 09/17/2013   PLT 171 09/17/2013   GLUCOSE 87 09/17/2013   CHOL 295* 12/12/2008   TRIG 95.0 12/12/2008   HDL 85.60 12/12/2008   LDLDIRECT 189.2 12/12/2008   ALT 20 11/03/2011   AST 19 11/03/2011   NA 143 09/17/2013   K 4.0 09/17/2013   CL 107 09/17/2013   CREATININE 0.92  09/17/2013   BUN 14 09/17/2013   CO2 22 09/17/2013   TSH 1.95 11/03/2011   INR 1.65* 12/10/2009    No results found.     Assessment & Plan:   Problem List Items Addressed This Visit   Aortic stenosis, moderate     Murmur on exam unchanged mod aortic stenosis on echo 09/2013 - unchanged from 2011 Appears asymptomatic Monitor annually    HYPERTENSION      BP Readings from Last 3 Encounters:  11/23/13 128/82  09/26/13 152/70  09/19/13 151/80   Usually well controlled with current ARB -  Changed from Benicar to generic losartan per formulary 2014 The current medical regimen is effective;  continue present plan and medications.     Insomnia     Chronic issues, suspect exacerbated by progressive dementia with sundowning Family requests medication for treatment of same We'll try low dose benzodiazepine -Restoril 7.5 mg each bedtime as needed Instructed on importance of minimizing alcohol intake Will consider alternate medication for sundowning if unimproved/worse    OBSTRUCTIVE CHRONIC BRONCHITIS - Primary     Follows with pulm as needed -  Flare spring 2015 reviewed and summer 2015, improved s/p treatment  continue symbicort, prn Alb MDI  Intol of Daliresp 01/2011 Again, advised on importance of smoking cessation - using e-cig now chantix rx given today after long review of  potential side effects -we reviewed potential risk/benefit and possible side effects - pt/friend understands and agrees to same      Relevant Medications      varenicline (CHANTIX) tablet

## 2013-11-23 NOTE — Assessment & Plan Note (Signed)
Chronic issues, suspect exacerbated by progressive dementia with sundowning Family requests medication for treatment of same We'll try low dose benzodiazepine -Restoril 7.5 mg each bedtime as needed Instructed on importance of minimizing alcohol intake Will consider alternate medication for sundowning if unimproved/worse

## 2013-11-23 NOTE — Assessment & Plan Note (Signed)
Follows with pulm as needed -  Flare spring 2015 reviewed and summer 2015, improved s/p treatment  continue symbicort, prn Alb MDI  Intol of Daliresp 01/2011 Again, advised on importance of smoking cessation - using e-cig now chantix rx given today after long review of potential side effects -we reviewed potential risk/benefit and possible side effects - pt/friend understands and agrees to same

## 2013-11-23 NOTE — Progress Notes (Signed)
Pre visit review using our clinic review tool, if applicable. No additional management support is needed unless otherwise documented below in the visit note. 

## 2013-11-23 NOTE — Assessment & Plan Note (Signed)
Murmur on exam unchanged mod aortic stenosis on echo 09/2013 - unchanged from 2011 Appears asymptomatic Monitor annually

## 2013-11-23 NOTE — Assessment & Plan Note (Signed)
BP Readings from Last 3 Encounters:  11/23/13 128/82  09/26/13 152/70  09/19/13 151/80   Usually well controlled with current ARB -  Changed from Benicar to generic losartan per formulary 2014 The current medical regimen is effective;  continue present plan and medications.

## 2013-11-23 NOTE — Patient Instructions (Signed)
It was good to see you today.  We have reviewed your prior records including labs and tests today  Medications reviewed and updated, use low dose restoril for sleep at bedtime as needed  Ok for Chantix WHEN family can supervise your use of same Your prescription(s) have been given to you to submit to your pharmacy. Please take as directed and contact our office if you believe you are having problem(s) with the medication(s). no other changes recommended at this time.  Please schedule followup in 6 months to review, call sooner if problems.

## 2013-11-24 ENCOUNTER — Telehealth: Payer: Self-pay | Admitting: Internal Medicine

## 2013-11-24 NOTE — Telephone Encounter (Signed)
emmi mailed  °

## 2013-12-08 ENCOUNTER — Telehealth: Payer: Self-pay | Admitting: Internal Medicine

## 2013-12-08 NOTE — Telephone Encounter (Signed)
Pt called in and needs Dr Felicity CoyerLeschber to send order over [facility] dis continue oxygen and to pick up tanks.  They are charging her hundreds of dollars a month because they haven't been picked up an gotten order she said.     Fax order to [phone] Attn Minerva AreolaEric

## 2013-12-12 NOTE — Telephone Encounter (Signed)
[place] response...Raechel Chute/lmb

## 2013-12-12 NOTE — Telephone Encounter (Signed)
[facility] Please call [facility] as requested to DC these thanks

## 2014-03-03 ENCOUNTER — Other Ambulatory Visit: Payer: Self-pay | Admitting: *Deleted

## 2014-03-03 MED ORDER — LOSARTAN POTASSIUM 50 MG PO TABS: 50.0000 mg | ORAL_TABLET | Freq: Every day | ORAL | Status: DC

## 2014-03-11 ENCOUNTER — Other Ambulatory Visit: Payer: Self-pay

## 2014-03-11 MED ORDER — BUDESONIDE-FORMOTEROL FUMARATE 160-4.5 MCG/ACT IN AERO: 2.0000 | INHALATION_SPRAY | Freq: Two times a day (BID) | RESPIRATORY_TRACT | Status: DC

## 2014-05-30 ENCOUNTER — Ambulatory Visit: Payer: Medicare PPO | Admitting: Internal Medicine

## 2014-06-02 ENCOUNTER — Encounter: Payer: Self-pay | Admitting: Internal Medicine

## 2014-06-02 ENCOUNTER — Ambulatory Visit: Payer: Medicare PPO | Admitting: Internal Medicine

## 2014-06-02 ENCOUNTER — Ambulatory Visit (INDEPENDENT_AMBULATORY_CARE_PROVIDER_SITE_OTHER): Payer: Commercial Managed Care - HMO | Admitting: Internal Medicine

## 2014-06-02 VITALS — BP 138/66 | HR 93 | Temp 98.4°F | Resp 14 | Wt 133.8 lb

## 2014-06-02 DIAGNOSIS — J449 Chronic obstructive pulmonary disease, unspecified: Secondary | ICD-10-CM | POA: Diagnosis not present

## 2014-06-02 DIAGNOSIS — F17200 Nicotine dependence, unspecified, uncomplicated: Secondary | ICD-10-CM

## 2014-06-02 DIAGNOSIS — Z720 Tobacco use: Secondary | ICD-10-CM

## 2014-06-02 MED ORDER — VARENICLINE TARTRATE 1 MG PO TABS: 1.0000 mg | ORAL_TABLET | Freq: Two times a day (BID) | ORAL | Status: DC

## 2014-06-02 MED ORDER — TIOTROPIUM BROMIDE MONOHYDRATE 18 MCG IN CAPS: 18.0000 ug | ORAL_CAPSULE | Freq: Every day | RESPIRATORY_TRACT | Status: DC

## 2014-06-02 NOTE — Assessment & Plan Note (Signed)
Did well with chantix and would like to try again. Will advise her to stay on it for 3 months at least this time since better success rates with staying quit. Reminded her about the risk of tobacco smoke and the permanent damage to the lungs.

## 2014-06-02 NOTE — Assessment & Plan Note (Addendum)
Doing okay with symbicort but not ideally controlled. Not in exacerbation today and no steroids or antibiotics needed today. Would like more breathing capacity and will add spiriva to the regimen. She still has albuterol prn and will stop smoking. Advised her that eventually if she keeps smoking we will not be able to help her breathing with medicines as the cigarettes are strong and causing damage.

## 2014-06-02 NOTE — Progress Notes (Signed)
Pre visit review using our clinic review tool, if applicable. No additional management support is needed unless otherwise documented below in the visit note. 

## 2014-06-02 NOTE — Patient Instructions (Signed)
We have sent in spiriva which is a once daily breathing medicine to give you more breath when you walk. Take it everyday to help out.   We do not think that you need the nebulizer right now.   Stop smoking, we have sent in the chantix (you need to stop within the first month of taking it). The cigarettes are hurting your lungs and causing permanent damage which eventually medicines will not be enough to help.   Smoking Cessation Quitting smoking is important to your health and has many advantages. However, it is not always easy to quit since nicotine is a very addictive drug. Oftentimes, people try 3 times or more before being able to quit. This document explains the best ways for you to prepare to quit smoking. Quitting takes hard work and a lot of effort, but you can do it. ADVANTAGES OF QUITTING SMOKING  You will live longer, feel better, and live better.  Your body will feel the impact of quitting smoking almost immediately.  Within 20 minutes, blood pressure decreases. Your pulse returns to its normal level.  After 8 hours, carbon monoxide levels in the blood return to normal. Your oxygen level increases.  After 24 hours, the chance of having a heart attack starts to decrease. Your breath, hair, and body stop smelling like smoke.  After 48 hours, damaged nerve endings begin to recover. Your sense of taste and smell improve.  After 72 hours, the body is virtually free of nicotine. Your bronchial tubes relax and breathing becomes easier.  After 2 to 12 weeks, lungs can hold more air. Exercise becomes easier and circulation improves.  The risk of having a heart attack, stroke, cancer, or lung disease is greatly reduced.  After 1 year, the risk of coronary heart disease is cut in half.  After 5 years, the risk of stroke falls to the same as a nonsmoker.  After 10 years, the risk of lung cancer is cut in half and the risk of other cancers decreases significantly.  After 15 years,  the risk of coronary heart disease drops, usually to the level of a nonsmoker.  If you are pregnant, quitting smoking will improve your chances of having a healthy baby.  The people you live with, especially any children, will be healthier.  You will have extra money to spend on things other than cigarettes. QUESTIONS TO THINK ABOUT BEFORE ATTEMPTING TO QUIT You may want to talk about your answers with your health care provider.  Why do you want to quit?  If you tried to quit in the past, what helped and what did not?  What will be the most difficult situations for you after you quit? How will you plan to handle them?  Who can help you through the tough times? Your family? Friends? A health care provider?  What pleasures do you get from smoking? What ways can you still get pleasure if you quit? Here are some questions to ask your health care provider:  How can you help me to be successful at quitting?  What medicine do you think would be best for me and how should I take it?  What should I do if I need more help?  What is smoking withdrawal like? How can I get information on withdrawal? GET READY  Set a quit date.  Change your environment by getting rid of all cigarettes, ashtrays, matches, and lighters in your home, car, or work. Do not let people smoke in your home.  Review your past attempts to quit. Think about what worked and what did not. GET SUPPORT AND ENCOURAGEMENT You have a better chance of being successful if you have help. You can get support in many ways.  Tell your family, friends, and coworkers that you are going to quit and need their support. Ask them not to smoke around you.  Get individual, group, or telephone counseling and support. Programs are available at [facility] and health centers. Call your local health department for information about programs in your area.  Spiritual beliefs and practices may help some smokers quit.  Download a "quit  meter" on your computer to keep track of quit statistics, such as how long you have gone without smoking, cigarettes not smoked, and money saved.  Get a self-help book about quitting smoking and staying off tobacco. LEARN NEW SKILLS AND BEHAVIORS  Distract yourself from urges to smoke. Talk to someone, go for a walk, or occupy your time with a task.  Change your normal routine. Take a different route to work. Drink tea instead of coffee. Eat breakfast in a different place.  Reduce your stress. Take a hot bath, exercise, or read a book.  Plan something enjoyable to do every day. Reward yourself for not smoking.  Explore interactive web-based programs that specialize in helping you quit. GET MEDICINE AND USE IT CORRECTLY Medicines can help you stop smoking and decrease the urge to smoke. Combining medicine with the above behavioral methods and support can greatly increase your chances of successfully quitting smoking.  Nicotine replacement therapy helps deliver nicotine to your body without the negative effects and risks of smoking. Nicotine replacement therapy includes nicotine gum, lozenges, inhalers, nasal sprays, and skin patches. Some may be available over-the-counter and others require a prescription.  Antidepressant medicine helps people abstain from smoking, but how this works is unknown. This medicine is available by prescription.  Nicotinic receptor partial agonist medicine simulates the effect of nicotine in your brain. This medicine is available by prescription. Ask your health care provider for advice about which medicines to use and how to use them based on your health history. Your health care provider will tell you what side effects to look out for if you choose to be on a medicine or therapy. Carefully read the information on the package. Do not use any other product containing nicotine while using a nicotine replacement product.  RELAPSE OR DIFFICULT SITUATIONS Most relapses  occur within the first 3 months after quitting. Do not be discouraged if you start smoking again. Remember, most people try several times before finally quitting. You may have symptoms of withdrawal because your body is used to nicotine. You may crave cigarettes, be irritable, feel very hungry, cough often, get headaches, or have difficulty concentrating. The withdrawal symptoms are only temporary. They are strongest when you first quit, but they will go away within 10-14 days. To reduce the chances of relapse, try to:  Avoid drinking alcohol. Drinking lowers your chances of successfully quitting.  Reduce the amount of caffeine you consume. Once you quit smoking, the amount of caffeine in your body increases and can give you symptoms, such as a rapid heartbeat, sweating, and anxiety.  Avoid smokers because they can make you want to smoke.  Do not let weight gain distract you. Many smokers will gain weight when they quit, usually less than 10 pounds. Eat a healthy diet and stay active. You can always lose the weight gained after you quit.  Find  ways to improve your mood other than smoking. FOR MORE INFORMATION  www.smokefree.gov  Document Released: 02/04/2001 Document Revised: 06/27/2013 Document Reviewed: 05/22/2011 [facility] Patient Information 2015 [place], [place]. This information is not intended to replace advice given to you by your health care provider. Make sure you discuss any questions you have with your health care provider.

## 2014-06-02 NOTE — Progress Notes (Signed)
   Subjective:    Patient ID: Karina Andrews, female    DOB: [birth year], 81 y.o.   MRN: [id number]  HPI The patient is an 81 YO female who is coming in today to follow up on her breathing and smoking. She is able to walk quite far without stopping and at the grocery store walks without stopping as long as she has the cart to steady herself. Rarely uses the albuterol but uses the symbicort every day. She has healed from the rib fractures and does not need oxygen or nebulizer anymore. She has started smoking again 4-5 cigarettes per day since stopping the chantix. She took it again 1 month and it worked well. She did not have any side effects. She would like to stop again as she felt better when she was not smoking.   Review of Systems  Constitutional: Negative for fever, activity change, appetite change, fatigue and unexpected weight change.  Respiratory: Positive for cough. Negative for chest tightness, shortness of breath and wheezing.        Chronic unchanged, some SOB with heavy exertion  Cardiovascular: Negative for chest pain, palpitations and leg swelling.  Gastrointestinal: Negative.   Musculoskeletal: Negative.   Skin: Negative.   Neurological: Negative.       Objective:   Physical Exam  Constitutional: She appears well-developed and well-nourished.  Speech slow  HENT:  Head: Normocephalic and atraumatic.  Eyes: EOM are normal.  Neck: Normal range of motion.  Cardiovascular: Normal rate.   Murmur heard. Pulmonary/Chest: Effort normal. She has no wheezes. She has no rales.  No wheezing or rales, good air flow  Abdominal: Soft. She exhibits no distension. There is no tenderness.  Neurological: Coordination abnormal.  Uses rolling walker  Skin: Skin is warm and dry.   Filed Vitals:   06/02/14 1352  BP: 138/66  Pulse: 93  Temp: 98.4 F (36.9 C)  TempSrc: Oral  Resp: 14  Weight: 133 lb 12.8 oz (60.691 kg)  SpO2: 96%      Assessment & Plan:

## 2014-06-27 ENCOUNTER — Telehealth: Payer: Self-pay | Admitting: Internal Medicine

## 2014-06-27 NOTE — Telephone Encounter (Signed)
Left message for patient to call office. Need to know which knee is being treated. She has been to [facility] in the past for both knees.

## 2014-06-27 NOTE — Telephone Encounter (Signed)
Patient is needing a [employer] referral for Dr. Turner Danielsowan for a knee injection before they will make an appointment.  She has seen this doctor before.

## 2014-07-05 ENCOUNTER — Other Ambulatory Visit: Payer: Self-pay | Admitting: Internal Medicine

## 2014-07-11 DIAGNOSIS — M1712 Unilateral primary osteoarthritis, left knee: Secondary | ICD-10-CM | POA: Diagnosis not present

## 2014-07-11 DIAGNOSIS — M1711 Unilateral primary osteoarthritis, right knee: Secondary | ICD-10-CM | POA: Diagnosis not present

## 2014-07-26 ENCOUNTER — Other Ambulatory Visit: Payer: Self-pay | Admitting: Internal Medicine

## 2014-08-16 ENCOUNTER — Emergency Department (HOSPITAL_COMMUNITY): Payer: Commercial Managed Care - HMO

## 2014-08-16 ENCOUNTER — Encounter (HOSPITAL_COMMUNITY): Payer: Self-pay | Admitting: *Deleted

## 2014-08-16 ENCOUNTER — Emergency Department (HOSPITAL_COMMUNITY)
Admission: EM | Admit: 2014-08-16 | Discharge: 2014-08-16 | Disposition: A | Discharge status: Home / Self Care | Payer: Commercial Managed Care - HMO | Attending: Emergency Medicine | Admitting: Emergency Medicine

## 2014-08-16 DIAGNOSIS — F329 Major depressive disorder, single episode, unspecified: Secondary | ICD-10-CM | POA: Diagnosis not present

## 2014-08-16 DIAGNOSIS — R200 Anesthesia of skin: Secondary | ICD-10-CM | POA: Diagnosis not present

## 2014-08-16 DIAGNOSIS — J449 Chronic obstructive pulmonary disease, unspecified: Secondary | ICD-10-CM | POA: Insufficient documentation

## 2014-08-16 DIAGNOSIS — Y9389 Activity, other specified: Secondary | ICD-10-CM | POA: Insufficient documentation

## 2014-08-16 DIAGNOSIS — Z8739 Personal history of other diseases of the musculoskeletal system and connective tissue: Secondary | ICD-10-CM | POA: Insufficient documentation

## 2014-08-16 DIAGNOSIS — W208XXA Other cause of strike by thrown, projected or falling object, initial encounter: Secondary | ICD-10-CM | POA: Diagnosis not present

## 2014-08-16 DIAGNOSIS — Z79899 Other long term (current) drug therapy: Secondary | ICD-10-CM | POA: Insufficient documentation

## 2014-08-16 DIAGNOSIS — Z720 Tobacco use: Secondary | ICD-10-CM | POA: Diagnosis not present

## 2014-08-16 DIAGNOSIS — Z7951 Long term (current) use of inhaled steroids: Secondary | ICD-10-CM | POA: Insufficient documentation

## 2014-08-16 DIAGNOSIS — Y9289 Other specified places as the place of occurrence of the external cause: Secondary | ICD-10-CM | POA: Diagnosis not present

## 2014-08-16 DIAGNOSIS — Y998 Other external cause status: Secondary | ICD-10-CM | POA: Diagnosis not present

## 2014-08-16 DIAGNOSIS — S9031XA Contusion of right foot, initial encounter: Secondary | ICD-10-CM | POA: Diagnosis not present

## 2014-08-16 DIAGNOSIS — I10 Essential (primary) hypertension: Secondary | ICD-10-CM | POA: Insufficient documentation

## 2014-08-16 DIAGNOSIS — S99921A Unspecified injury of right foot, initial encounter: Secondary | ICD-10-CM | POA: Diagnosis present

## 2014-08-16 DIAGNOSIS — M79671 Pain in right foot: Secondary | ICD-10-CM | POA: Diagnosis not present

## 2014-08-16 MED ORDER — ACETAMINOPHEN 325 MG PO TABS
650.0000 mg | ORAL_TABLET | Freq: Once | ORAL | Status: AC
  Administered 2014-08-16: 650 mg via ORAL
  Filled 2014-08-16: qty 2

## 2014-08-16 NOTE — ED Provider Notes (Signed)
CSN: [id number]     Arrival date & time 08/16/14  4098 History   First MD Initiated Contact with Patient 08/16/14 0920     Chief Complaint  Patient presents with  . Foot Injury     (Consider location/radiation/quality/duration/timing/severity/associated sxs/prior Treatment) Patient is a 81 y.o. female presenting with foot injury. The history is provided by the patient.  Foot Injury Associated symptoms: no back pain and no fever    patient was at home last night when her walker fell forward and the tray on a hit her on the top of right foot. She's had some pain since. No loss conscious. No other injury. She has been walking on it since.  Past Medical History  Diagnosis Date  . Hypertension   . Depression   . Osteoporosis     (R) hip fx 11/2009 & (L) hip fx 2006  . COPD (chronic obstructive pulmonary disease)   . Cervical spondylosis with myelopathy 01/2009    s/p decompression  . Aortic stenosis     moderate on echo 09/2013   Past Surgical History  Procedure Laterality Date  . Bladder tuck    . Cholecystectomy  1995  . Posterior laminectomy / decompression cervical spine  01/2009    Done by Dr. Danielle Dess  . Right hip  11/2009    ORIF/ Done by Dr. Turner Daniels  . Cataract extraction Right 06/06/13   No family history on file. History  Substance Use Topics  . Smoking status: Current Every Day Smoker    Types: Cigarettes  . Smokeless tobacco: Not on file     Comment: Smoker 1 cigarette per day. 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 Alcohol Use: No   OB History    No data available     Review of Systems  Constitutional: Negative for fever.  Musculoskeletal: Negative for back pain and joint swelling.       Right foot pain  Neurological: Positive for numbness. Negative for weakness.      Allergies  Review of patient's allergies indicates no known allergies.  Home Medications   Prior to Admission medications    Medication Sig Start Date End Date Taking? Authorizing Provider  albuterol (PROVENTIL HFA) 108 (90 BASE) MCG/ACT inhaler Inhale 2 puffs into the lungs every 6 (six) hours as needed for wheezing or shortness of breath. 04/20/12  Yes Newt Lukes, MD  budesonide-formoterol (SYMBICORT) 160-4.5 MCG/ACT inhaler Inhale 2 puffs into the lungs 2 (two) times daily. 03/11/14  Yes Newt Lukes, MD  losartan (COZAAR) 50 MG tablet Take 1 tablet (50 mg total) by mouth daily. 03/03/14  Yes Newt Lukes, MD  albuterol (VENTOLIN HFA) 108 (90 BASE) MCG/ACT inhaler Inhale 2 puffs into the lungs every 6 (six) hours as needed for wheezing or shortness of breath. Patient not taking: Reported on 08/16/2014 07/26/14   Newt Lukes, MD  SYMBICORT 160-4.5 MCG/ACT inhaler INHALE 2 PUFSF BY MOUTH INTO THE LUNGS TWICE DAILY. Patient not taking: Reported on 08/16/2014 07/06/14   Newt Lukes, MD  temazepam (RESTORIL) 7.5 MG capsule Take 1 capsule (7.5 mg total) by mouth at bedtime as needed for sleep. Patient not taking: Reported on 08/16/2014 11/23/13   Newt Lukes, MD  tiotropium (SPIRIVA HANDIHALER) 18 MCG inhalation capsule Place 1 capsule (18 mcg total) into inhaler and inhale daily. Patient not taking: Reported on 08/16/2014 06/02/14   Judie Bonus, MD  traMADol (ULTRAM) 50 MG tablet Take  1 tablet (50 mg total) by mouth every 8 (eight) hours as needed. Patient not taking: Reported on 06/02/2014 09/26/13   Pecola Lawless, MD  varenicline (CHANTIX CONTINUING MONTH PAK) 1 MG tablet Take 1 tablet (1 mg total) by mouth 2 (two) times daily. Patient not taking: Reported on 08/16/2014 06/02/14   Judie Bonus, MD   BP 130/75 mmHg  Pulse 86  Temp(Src) 98.3 F (36.8 C) (Oral)  Resp 18  SpO2 96% Physical Exam  Constitutional: She appears well-developed.  Cardiovascular: Normal rate and regular rhythm.   Musculoskeletal: She exhibits tenderness.  Some erythema and tenderness along the top of  the right midfoot. Neurovascular intact over foot. Strong dorsalis pedis pulse.    ED Course  Procedures (including critical care time) Labs Review Labs Reviewed - No data to display  Imaging Review Dg Foot Complete Right  08/16/2014   CLINICAL DATA:  Initial encounter for pain and redness across mid foot after metal tray fell on it yesterday at home  EXAM: RIGHT FOOT COMPLETE - 3+ VIEW  COMPARISON:  None.  FINDINGS: There is no evidence of fracture or dislocation. There is no evidence of arthropathy or other focal bone abnormality. Soft tissues are unremarkable.  IMPRESSION: Diffuse bony demineralization without acute abnormality.   Electronically Signed   By: Kennith Center M.D.   On: 08/16/2014 10:12     EKG Interpretation None      MDM   Final diagnoses:  Foot contusion, right, initial encounter    Patient with foot injury. Negative x-ray. Doubt fracture. Will discharge home. Patient is not good with medications will not give narcotics. She is somewhat unsteady at baseline and will not add postop shoe.    Benjiman Core, MD 08/16/14 [id number]

## 2014-08-16 NOTE — Discharge Instructions (Signed)
Tylenol as needed for pain.  Foot Contusion A foot contusion is a deep bruise to the foot. Contusions are the result of an injury that caused bleeding under the skin. The contusion may turn blue, purple, or yellow. Minor injuries will give you a painless contusion, but more severe contusions may stay painful and swollen for a few weeks. CAUSES  A foot contusion comes from a direct blow to that area, such as a heavy object falling on the foot. SYMPTOMS   Swelling of the foot.  Discoloration of the foot.  Tenderness or soreness of the foot. DIAGNOSIS  You will have a physical exam and will be asked about your history. You may need an X-ray of your foot to look for a broken bone (fracture).  TREATMENT  An elastic wrap may be recommended to support your foot. Resting, elevating, and applying cold compresses to your foot are often the best treatments for a foot contusion. Over-the-counter medicines may also be recommended for pain control. HOME CARE INSTRUCTIONS   Put ice on the injured area.  Put ice in a plastic bag.  Place a towel between your skin and the bag.  Leave the ice on for 15-20 minutes, 03-04 times a day.  Only take over-the-counter or prescription medicines for pain, discomfort, or fever as directed by your caregiver.  If told, use an elastic wrap as directed. This can help reduce swelling. You may remove the wrap for sleeping, showering, and bathing. If your toes become numb, cold, or blue, take the wrap off and reapply it more loosely.  Elevate your foot with pillows to reduce swelling.  Try to avoid standing or walking while the foot is painful. Do not resume use until instructed by your caregiver. Then, begin use gradually. If pain develops, decrease use. Gradually increase activities that do not cause discomfort until you have normal use of your foot.  See your caregiver as directed. It is very important to keep all follow-up appointments in order to avoid any  lasting problems with your foot, including long-term (chronic) pain. SEEK IMMEDIATE MEDICAL CARE IF:   You have increased redness, swelling, or pain in your foot.  Your swelling or pain is not relieved with medicines.  You have loss of feeling in your foot or are unable to move your toes.  Your foot turns cold or blue.  You have pain when you move your toes.  Your foot becomes warm to the touch.  Your contusion does not improve in 2 days. MAKE SURE YOU:   Understand these instructions.  Will watch your condition.  Will get help right away if you are not doing well or get worse. Document Released: 12/02/2005 Document Revised: 08/12/2011 Document Reviewed: 01/14/2011 [employer] Patient Information 2015 [place], [place]. This information is not intended to replace advice given to you by your health care provider. Make sure you discuss any questions you have with your health care provider.

## 2014-08-16 NOTE — ED Notes (Signed)
Pt presents to ED with c/o foot injury, pt sts her walker fell on top of her right foot last night. Slight selling and redness noted to top of right foot

## 2014-09-30 ENCOUNTER — Other Ambulatory Visit: Payer: Self-pay | Admitting: Internal Medicine

## 2014-11-14 DIAGNOSIS — H5210 Myopia, unspecified eye: Secondary | ICD-10-CM | POA: Diagnosis not present

## 2014-11-14 DIAGNOSIS — H5231 Anisometropia: Secondary | ICD-10-CM | POA: Diagnosis not present

## 2015-03-24 ENCOUNTER — Other Ambulatory Visit: Payer: Self-pay | Admitting: Internal Medicine

## 2015-04-11 ENCOUNTER — Other Ambulatory Visit (INDEPENDENT_AMBULATORY_CARE_PROVIDER_SITE_OTHER): Payer: Commercial Managed Care - HMO

## 2015-04-11 ENCOUNTER — Encounter: Payer: Self-pay | Admitting: Internal Medicine

## 2015-04-11 ENCOUNTER — Ambulatory Visit (INDEPENDENT_AMBULATORY_CARE_PROVIDER_SITE_OTHER): Payer: Commercial Managed Care - HMO | Admitting: Internal Medicine

## 2015-04-11 ENCOUNTER — Ambulatory Visit (INDEPENDENT_AMBULATORY_CARE_PROVIDER_SITE_OTHER)
Admission: RE | Admit: 2015-04-11 | Discharge: 2015-04-11 | Disposition: A | Discharge status: Home / Self Care | Payer: Commercial Managed Care - HMO | Source: Ambulatory Visit | Attending: Internal Medicine | Admitting: Internal Medicine

## 2015-04-11 VITALS — BP 138/80 | HR 94 | Temp 98.6°F | Resp 20 | Wt 136.0 lb

## 2015-04-11 DIAGNOSIS — Z720 Tobacco use: Secondary | ICD-10-CM | POA: Diagnosis not present

## 2015-04-11 DIAGNOSIS — I350 Nonrheumatic aortic (valve) stenosis: Secondary | ICD-10-CM

## 2015-04-11 DIAGNOSIS — F17210 Nicotine dependence, cigarettes, uncomplicated: Secondary | ICD-10-CM | POA: Insufficient documentation

## 2015-04-11 DIAGNOSIS — R0600 Dyspnea, unspecified: Secondary | ICD-10-CM

## 2015-04-11 DIAGNOSIS — I10 Essential (primary) hypertension: Secondary | ICD-10-CM | POA: Diagnosis not present

## 2015-04-11 DIAGNOSIS — F17200 Nicotine dependence, unspecified, uncomplicated: Secondary | ICD-10-CM

## 2015-04-11 DIAGNOSIS — R0602 Shortness of breath: Secondary | ICD-10-CM | POA: Diagnosis not present

## 2015-04-11 LAB — CBC WITH DIFFERENTIAL/PLATELET
BASOS PCT: 0.4 % (ref 0.0–3.0)
Basophils Absolute: 0 10*3/uL (ref 0.0–0.1)
Eosinophils Absolute: 0.1 10*3/uL (ref 0.0–0.7)
Eosinophils Relative: 2.2 % (ref 0.0–5.0)
HEMATOCRIT: 41.1 % (ref 36.0–46.0)
Hemoglobin: 13.9 g/dL (ref 12.0–15.0)
LYMPHS ABS: 1.7 10*3/uL (ref 0.7–4.0)
LYMPHS PCT: 25 % (ref 12.0–46.0)
MCHC: 33.8 g/dL (ref 30.0–36.0)
MCV: 91.6 fl (ref 78.0–100.0)
MONOS PCT: 7.5 % (ref 3.0–12.0)
Monocytes Absolute: 0.5 10*3/uL (ref 0.1–1.0)
NEUTROS ABS: 4.3 10*3/uL (ref 1.4–7.7)
NEUTROS PCT: 64.9 % (ref 43.0–77.0)
PLATELETS: 180 10*3/uL (ref 150.0–400.0)
RBC: 4.49 Mil/uL (ref 3.87–5.11)
RDW: 13.6 % (ref 11.5–15.5)
WBC: 6.6 10*3/uL (ref 4.0–10.5)

## 2015-04-11 LAB — URINALYSIS, ROUTINE W REFLEX MICROSCOPIC
Bilirubin Urine: NEGATIVE
Ketones, ur: 15 — AB
Nitrite: POSITIVE — AB
SPECIFIC GRAVITY, URINE: 1.015 (ref 1.000–1.030)
Total Protein, Urine: NEGATIVE
URINE GLUCOSE: NEGATIVE
Urobilinogen, UA: 0.2 (ref 0.0–1.0)
pH: 6 (ref 5.0–8.0)

## 2015-04-11 NOTE — Progress Notes (Signed)
Pre visit review using our clinic review tool, if applicable. No additional management support is needed unless otherwise documented below in the visit note. 

## 2015-04-11 NOTE — Progress Notes (Signed)
Subjective:    Patient ID: Karina Andrews, female    DOB: 1930-10-05, 82 y.o.   MRN: [id number]  HPI  Here with 1 wk worsening fatigue, sob/doe (former pt of Dr Felicity Coyer, has not yet seen Dr Lawerance Bach), thinks oxygen may be running low.  Pt denies chest pain, increased sob or doe, wheezing, orthopnea, PND, increased LE swelling, palpitations, dizziness or syncope.  Pt denies new neurological symptoms such as new headache, or facial or extremity weakness or numbness  No overt bleeding.   Pt denies fever, wt loss, night sweats, loss of appetite, or other constitutional symptoms  Walks with walker, mostly independent, has some attention at her personal home once monthly byt [facility] home [facility] (i.e transport today, but pt can drive some, can do most of her shopping).  Denies worsening depressive symptoms, suicidal ideation, or panic Past Medical History  Diagnosis Date  . Hypertension   . Depression   . Osteoporosis     (R) hip fx 11/2009 & (L) hip fx 2006  . COPD (chronic obstructive pulmonary disease) (HCC)   . Cervical spondylosis with myelopathy 01/2009    s/p decompression  . Aortic stenosis     moderate on echo 09/2013   Past Surgical History  Procedure Laterality Date  . Bladder tuck    . Cholecystectomy  1995  . Posterior laminectomy / decompression cervical spine  01/2009    Done by Dr. Danielle Dess  . Right hip  11/2009    ORIF/ Done by Dr. Turner Daniels  . Cataract extraction Right 06/06/13    reports that she has been smoking Cigarettes.  She does not have any smokeless tobacco history on file. She reports that she does not drink alcohol or use illicit drugs. family history is not on file. No Known Allergies Current Outpatient Prescriptions on File Prior to Visit  Medication Sig Dispense Refill  . albuterol (PROVENTIL HFA) 108 (90 BASE) MCG/ACT inhaler Inhale 2 puffs into the lungs every 6 (six) hours as needed for wheezing or shortness of breath. 18 g 5  . albuterol (VENTOLIN HFA) 108  (90 BASE) MCG/ACT inhaler Inhale 2 puffs into the lungs every 6 (six) hours as needed for wheezing or shortness of breath. 18 g 2  . budesonide-formoterol (SYMBICORT) 160-4.5 MCG/ACT inhaler Inhale 2 puffs into the lungs 2 (two) times daily. 10.2 g 11  . losartan (COZAAR) 50 MG tablet Take 1 tablet (50 mg total) by mouth daily. 90 tablet 3  . SYMBICORT 160-4.5 MCG/ACT inhaler INHALE 2 PUFSF BY MOUTH INTO THE LUNGS TWICE DAILY. 10.2 g 11  . temazepam (RESTORIL) 7.5 MG capsule Take 1 capsule (7.5 mg total) by mouth at bedtime as needed for sleep. 30 capsule 0  . tiotropium (SPIRIVA HANDIHALER) 18 MCG inhalation capsule Place 1 capsule (18 mcg total) into inhaler and inhale daily. 30 capsule 12  . traMADol (ULTRAM) 50 MG tablet Take 1 tablet (50 mg total) by mouth every 8 (eight) hours as needed. 30 tablet 0  . varenicline (CHANTIX CONTINUING MONTH PAK) 1 MG tablet Take 1 tablet (1 mg total) by mouth 2 (two) times daily. 60 tablet 3   No current facility-administered medications on file prior to visit.    Review of Systems  Constitutional: Negative for unusual diaphoresis or night sweats HENT: Negative for ringing in ear or discharge Eyes: Negative for double vision or worsening visual disturbance.  Respiratory: Negative for choking and stridor.   Gastrointestinal: Negative for vomiting or other signifcant bowel change  Genitourinary: Negative for hematuria or change in urine volume.  Musculoskeletal: Negative for other MSK pain or swelling Skin: Negative for color change and worsening wound.  Neurological: Negative for tremors and numbness other than noted  Psychiatric/Behavioral: Negative for decreased concentration or agitation other than above       Objective:   Physical Exam BP 138/80 mmHg  Pulse 94  Temp(Src) 98.6 F (37 C) (Oral)  Resp 20  Wt 136 lb (61.689 kg)  SpO2 93%  Ambulatory sat at 200 ft: on RA - 91% VS noted,  Constitutional: Pt appears in no significant  distress HENT: Head: NCAT.  Right Ear: External ear normal.  Left Ear: External ear normal.  Eyes: . Pupils are equal, round, and reactive to light. Conjunctivae and EOM are normal Neck: Normal range of motion. Neck supple.  Cardiovascular: Normal rate and regular rhythm.   Pulmonary/Chest: Effort normal and breath sounds decreased bilat with few LLL rales, no wheezing.  Abd:  Soft, NT, ND, + BS Neurological: Pt is alert. Not confused , motor grossly intact Skin: Skin is warm. No rash, no LE edema Psychiatric: Pt behavior is normal. No agitation.  Gr 2-3/6 sys murmur RUSB    Assessment & Plan:

## 2015-04-11 NOTE — Patient Instructions (Addendum)
Your oxygen level was 91 % with walking today, so you would not need home oxygen at this time  Please continue all other medications as before, and refills have been done if requested.  Please have the pharmacy call with any other refills you may need.  Please keep your appointments with your specialists as you may have planned  You will be contacted regarding the referral for: Echocardiogram  Please go to the XRAY Department in the Basement (go straight as you get off the elevator) for the x-ray testing  Please go to the LAB in the Basement (turn left off the elevator) for the tests to be done today  You will be contacted by phone if any changes need to be made immediately.  Otherwise, you will receive a letter about your results with an explanation, but please check with MyChart first.  Please remember to sign up for MyChart if you have not done so, as this will be important to you in the future with finding out test results, communicating by private email, and scheduling acute appointments online when needed.

## 2015-04-12 ENCOUNTER — Telehealth: Payer: Self-pay

## 2015-04-12 LAB — BASIC METABOLIC PANEL
BUN: 17 mg/dL (ref 6–23)
CALCIUM: 9.9 mg/dL (ref 8.4–10.5)
CHLORIDE: 107 meq/L (ref 96–112)
CO2: 23 meq/L (ref 19–32)
CREATININE: 0.72 mg/dL (ref 0.40–1.20)
GFR: 82.22 mL/min (ref >60.00)
Glucose, Bld: 97 mg/dL (ref 70–99)
Potassium: 4.1 mEq/L (ref 3.5–5.1)
SODIUM: 140 meq/L (ref 135–145)

## 2015-04-12 LAB — LIPID PANEL
CHOL/HDL RATIO: 3
Cholesterol: 260 mg/dL — ABNORMAL HIGH (ref 0–200)
HDL: 92.5 mg/dL (ref >39.00)
LDL CALC: 148 mg/dL — AB (ref 0–99)
NonHDL: 167.89
TRIGLYCERIDES: 98 mg/dL (ref 0.0–149.0)
VLDL: 19.6 mg/dL (ref 0.0–40.0)

## 2015-04-12 LAB — BRAIN NATRIURETIC PEPTIDE: PRO B NATRI PEPTIDE: 33 pg/mL (ref 0.0–100.0)

## 2015-04-12 LAB — HEPATIC FUNCTION PANEL
ALK PHOS: 74 U/L (ref 39–117)
ALT: 20 U/L (ref 0–35)
AST: 22 U/L (ref 0–37)
Albumin: 4.3 g/dL (ref 3.5–5.2)
BILIRUBIN DIRECT: 0.1 mg/dL (ref 0.0–0.3)
BILIRUBIN TOTAL: 0.4 mg/dL (ref 0.2–1.2)
TOTAL PROTEIN: 7.6 g/dL (ref 6.0–8.3)

## 2015-04-12 LAB — TSH: TSH: 1.92 u[IU]/mL (ref 0.35–4.50)

## 2015-04-12 NOTE — Telephone Encounter (Signed)
[facility] radiology called about patient, an 8mm nodule was found in her right upper lung and they are wanting to do a CT chest for further evaluation on patient, please advise if there is anything I need to do.

## 2015-04-13 ENCOUNTER — Encounter: Payer: Self-pay | Admitting: Internal Medicine

## 2015-04-13 ENCOUNTER — Other Ambulatory Visit: Payer: Self-pay | Admitting: Internal Medicine

## 2015-04-13 DIAGNOSIS — R911 Solitary pulmonary nodule: Secondary | ICD-10-CM

## 2015-04-13 MED ORDER — ROSUVASTATIN CALCIUM 10 MG PO TABS: 10.0000 mg | ORAL_TABLET | Freq: Every day | ORAL | Status: DC

## 2015-04-16 NOTE — Assessment & Plan Note (Signed)
stable overall by history and exam, recent data reviewed with pt, and pt to continue medical treatment as before,  to f/u any worsening symptoms or concerns BP Readings from Last 3 Encounters:  04/11/15 138/80  08/16/14 130/75  06/02/14 138/66

## 2015-04-16 NOTE — Assessment & Plan Note (Signed)
Urged to quit,  to f/u any worsening symptoms or concerns  

## 2015-04-16 NOTE — Assessment & Plan Note (Signed)
?   Related to dyspnea, for f/u echo,  to f/u any worsening symptoms or concerns

## 2015-04-16 NOTE — Assessment & Plan Note (Signed)
Etiology unclear, for cxr, labs as documented, exam o/w benign, does not qualify for Home o2 start,  to f/u any worsening symptoms or concerns

## 2015-04-18 ENCOUNTER — Other Ambulatory Visit: Payer: Self-pay | Admitting: Internal Medicine

## 2015-04-20 ENCOUNTER — Ambulatory Visit (INDEPENDENT_AMBULATORY_CARE_PROVIDER_SITE_OTHER)
Admission: RE | Admit: 2015-04-20 | Discharge: 2015-04-20 | Disposition: A | Discharge status: Home / Self Care | Payer: Commercial Managed Care - HMO | Source: Ambulatory Visit | Attending: Internal Medicine | Admitting: Internal Medicine

## 2015-04-20 ENCOUNTER — Encounter: Payer: Self-pay | Admitting: Internal Medicine

## 2015-04-20 ENCOUNTER — Other Ambulatory Visit: Payer: Self-pay | Admitting: Internal Medicine

## 2015-04-20 DIAGNOSIS — R911 Solitary pulmonary nodule: Secondary | ICD-10-CM

## 2015-04-20 DIAGNOSIS — R918 Other nonspecific abnormal finding of lung field: Secondary | ICD-10-CM | POA: Diagnosis not present

## 2015-04-24 ENCOUNTER — Telehealth: Payer: Self-pay | Admitting: Internal Medicine

## 2015-04-24 NOTE — Telephone Encounter (Signed)
Can you please advise in dr john's absence---thanks 

## 2015-04-24 NOTE — Telephone Encounter (Signed)
Pt's daughter request to speak to the nurse about Mrs. Balestrieri's CT result. Please give Foye Clock a call back  # [phone]

## 2015-04-24 NOTE — Telephone Encounter (Signed)
This is the MyChart note from Dr Jonny Ruiz   Left message on MyChart, pt to cont same tx, except  The test results show that your current treatment is OK, except there are 2 nodules in the lungs. We will refer you to pulmonary to see if any further evaluation or treatment is needed.  There is no other need for change of treatment or further evaluation based on these results at this time.. Please otherwise continue the same plan for further evaluation and treatment as discussed.  Corinne to inform pt, I will do referral

## 2015-04-25 NOTE — Telephone Encounter (Signed)
Patient returned my phone call and i have explained that lung specialist office will be calling patient back to schedule appt to review ct findings further---patient repeated back for understanding

## 2015-04-25 NOTE — Telephone Encounter (Signed)
i called patient's daughter back and advised that i cannot give any information about imaging results to her---there is no existing DPR form on patients chart----i tried to contact patient and left voicemail asking patient to call back for results---can talk with tamara when patient calls back

## 2015-04-26 ENCOUNTER — Ambulatory Visit (HOSPITAL_COMMUNITY): Payer: Commercial Managed Care - HMO | Attending: Cardiology

## 2015-04-26 ENCOUNTER — Other Ambulatory Visit: Payer: Self-pay

## 2015-04-26 DIAGNOSIS — I350 Nonrheumatic aortic (valve) stenosis: Secondary | ICD-10-CM | POA: Diagnosis not present

## 2015-04-26 DIAGNOSIS — I352 Nonrheumatic aortic (valve) stenosis with insufficiency: Secondary | ICD-10-CM | POA: Diagnosis not present

## 2015-04-26 DIAGNOSIS — I10 Essential (primary) hypertension: Secondary | ICD-10-CM | POA: Diagnosis not present

## 2015-04-26 DIAGNOSIS — Z720 Tobacco use: Secondary | ICD-10-CM | POA: Insufficient documentation

## 2015-04-26 DIAGNOSIS — R0600 Dyspnea, unspecified: Secondary | ICD-10-CM

## 2015-04-26 DIAGNOSIS — I059 Rheumatic mitral valve disease, unspecified: Secondary | ICD-10-CM | POA: Insufficient documentation

## 2015-04-27 ENCOUNTER — Other Ambulatory Visit: Payer: Self-pay | Admitting: Internal Medicine

## 2015-04-27 ENCOUNTER — Encounter: Payer: Self-pay | Admitting: Internal Medicine

## 2015-04-27 DIAGNOSIS — I350 Nonrheumatic aortic (valve) stenosis: Secondary | ICD-10-CM

## 2015-04-27 DIAGNOSIS — R0600 Dyspnea, unspecified: Secondary | ICD-10-CM

## 2015-04-30 ENCOUNTER — Telehealth: Payer: Self-pay | Admitting: Internal Medicine

## 2015-04-30 MED ORDER — HYDROXYZINE HCL 50 MG PO TABS: ORAL_TABLET | ORAL | Status: DC

## 2015-04-30 NOTE — Telephone Encounter (Signed)
Ok with me, but must be ok with Dr Okey Duprerawford as well, as she may be considered her pt, since was seen apr 2016

## 2015-04-30 NOTE — Telephone Encounter (Signed)
Pt came to see you a couple weeks ago and she was Dr. Felicity CoyerLeschber pt and she would love to stay with you as her primary. Please advise

## 2015-04-30 NOTE — Telephone Encounter (Signed)
Informed pt .

## 2015-04-30 NOTE — Telephone Encounter (Signed)
Spoke to Karina Andrews and gave MD response below.  I noticed that the benzo was sent in 2015. [name] confirmed that pt is not taking a benzo.

## 2015-04-30 NOTE — Telephone Encounter (Signed)
Ok for atarax for nerves, as pt already on benzo qhs - done erx

## 2015-04-30 NOTE — Addendum Note (Signed)
Addended by: Radford PaxAIRRIKIER DAVIDSON, Ahyana Skillin M on: 04/30/2015 04:59 PM   Modules accepted: Orders, Medications

## 2015-04-30 NOTE — Telephone Encounter (Signed)
pts daughter, Trula OreChristina has called stating pt is having extreme anxiety over thinking she does have cancer. She is not seeing the pulmonologist until Fri. Trula OreChristina is hoping you can call the patient to check on her and wondering if you can prescribe some anxiety medication. She's scared to the point she may need to take her to the ER

## 2015-04-30 NOTE — Telephone Encounter (Signed)
Called pt and informed that rx was sent in. (per Kristina's request).

## 2015-04-30 NOTE — Addendum Note (Signed)
Addended by: Corwin LevinsJOHN, Trenia Tennyson W on: 04/30/2015 04:37 PM   Modules accepted: Orders

## 2015-04-30 NOTE — Telephone Encounter (Signed)
Please advise, thanks.

## 2015-05-04 ENCOUNTER — Encounter: Payer: Self-pay | Admitting: Internal Medicine

## 2015-05-04 ENCOUNTER — Ambulatory Visit (INDEPENDENT_AMBULATORY_CARE_PROVIDER_SITE_OTHER): Payer: Commercial Managed Care - HMO | Admitting: Internal Medicine

## 2015-05-04 VITALS — BP 130/72 | HR 93 | Ht 64.0 in | Wt 134.8 lb

## 2015-05-04 DIAGNOSIS — J449 Chronic obstructive pulmonary disease, unspecified: Secondary | ICD-10-CM | POA: Diagnosis not present

## 2015-05-04 DIAGNOSIS — R0600 Dyspnea, unspecified: Secondary | ICD-10-CM | POA: Diagnosis not present

## 2015-05-04 DIAGNOSIS — Z720 Tobacco use: Secondary | ICD-10-CM

## 2015-05-04 DIAGNOSIS — I350 Nonrheumatic aortic (valve) stenosis: Secondary | ICD-10-CM | POA: Diagnosis not present

## 2015-05-04 DIAGNOSIS — F17210 Nicotine dependence, cigarettes, uncomplicated: Secondary | ICD-10-CM | POA: Diagnosis not present

## 2015-05-04 NOTE — Patient Instructions (Signed)
Plan A = Automatic = Symbicort 160 Take 2 puffs first thing in am and then another 2 puffs about 12 hours later.    Work on inhaler technique:  relax and gently blow all the way out then take a nice smooth deep breath back in, triggering the inhaler at same time you start breathing in.  Hold for up to 5 seconds if you can. Blow out thru nose. Rinse and gargle with water when done      Plan B = Backup Only use your albuterol as a rescue medication to be used if you can't catch your breath by resting or doing a relaxed purse lip breathing pattern.  - The less you use it, the better it will work when you need it. - Ok to use up to 2 puffs  every 4 hours if you must but call for appointment if use goes up over your usual need - Don't leave home without it !!  (think of it like the spare tire for your car)     Your heart valve appears to be your limiting problem at present   The key is to stop smoking completely before smoking completely stops you!   We will see you in 3 months to follow up on the lung nodules but these are the least of your problems

## 2015-05-04 NOTE — Progress Notes (Signed)
Subjective:    Patient ID: Karina Andrews, female    DOB: 1930-06-17,    MRN: [id number]  HPI  58 yowf active smoker with GOLD I copd 07/2008 maint on symbicort 160 2 bid and prn albuterol with increase doe x Jan 2017 > CT c/w mpn's so referred to pulmonary clinic 05/04/2015 by Dr Karina Andrews    05/04/2015 1st [facility] office visit/ Karina Andrews   Chief Complaint  Patient presents with  . Pulmonary Consult    Referred by Dr. Oliver Andrews. Pt c/o SOB x 6 wks. She gets SOB with talking and when she goes shopping.   baseline able to do some grocery shopping then gradually more sob just with room to room walking, not at rest or sleeping, minimal am cough /congestion no change in baseline other than the doe.  No assoc ex cp or pressyncope  No obvious other patterns in day to day or daytime variabilty or assoc excess/ purulent sputum or mucus plugs  or chest tightness, subjective wheeze overt sinus or hb symptoms. No unusual exp hx or h/o childhood pna/ asthma or knowledge of premature birth.  Sleeping ok without nocturnal  or early am exacerbation  of respiratory  c/o's or need for noct saba. Also denies any obvious fluctuation of symptoms with weather or environmental changes or other aggravating or alleviating factors except as outlined above   Current Medications, Allergies, Complete Past Medical History, Past Surgical History, Family History, and Social History were reviewed in [employer] record.            Review of Systems  Constitutional: Positive for appetite change. Negative for fever, chills and unexpected weight change.  HENT: Negative for congestion, dental problem, ear pain, nosebleeds, postnasal drip, rhinorrhea, sinus pressure, sneezing, sore throat, trouble swallowing and voice change.   Eyes: Negative for visual disturbance.  Respiratory: Positive for shortness of breath. Negative for cough and choking.   Cardiovascular: Negative for chest pain and leg  swelling.  Gastrointestinal: Negative for vomiting, abdominal pain and diarrhea.  Genitourinary: Negative for difficulty urinating.  Musculoskeletal: Negative for arthralgias.  Skin: Negative for rash.  Neurological: Negative for tremors, syncope and headaches.  Hematological: Does not bruise/bleed easily.       Objective:   Physical Exam  Frail elderly wf nad  Wt Readings from Last 3 Encounters:  05/04/15 134 lb 12.8 oz (61.145 kg)  04/11/15 136 lb (61.689 kg)  06/02/14 133 lb 12.8 oz (60.691 kg)    Vital signs reviewed  HEENT: nl  turbinates, and oropharynx. Nl external ear canals without cough reflex - upper and lower dentures    NECK :  without JVD/Nodes/TM/ nl carotid upstrokes bilaterally   LUNGS: no acc muscle use,  slt barrel contour chest with distant bs bilaterally/ no wheezing/  without cough on insp or exp maneuvers   CV:  RRR  no s3  - GIII/VI  SEM with decrease S2 but no increase in P2, no edema   ABD:  soft and nontender with nl inspiratory excursion in the supine position. No bruits or organomegaly, bowel sounds nl  MS:  Nl gait/ ext warm without deformities, calf tenderness, cyanosis or clubbing No obvious joint restrictions   SKIN: warm and dry without lesions    NEURO:  alert, approp, nl sensorium with  no motor deficits    I personally reviewed images and agree with radiology impression as follows:  CT Chest   04/20/15 The lungs are well  aerated bilaterally. Mild emphysematous changes are seen. In the right upper lobe there are 2 somewhat nodular appearing densities. One measures 12 mm and lies on image number 18 of series 3. The second measures just under 10 mm on image number 22 of series 3. These correspond with that seen on the prior exam. No other nodules are noted. The thoracic inlet is within normal limits. Calcific changes of the thoracic aorta and its branches are noted. Multiple calcified hilar and mediastinal lymph nodes are seen.  No sizable adenopathy is identified.          Assessment & Plan:

## 2015-05-05 DIAGNOSIS — J441 Chronic obstructive pulmonary disease with (acute) exacerbation: Secondary | ICD-10-CM | POA: Insufficient documentation

## 2015-05-05 NOTE — Assessment & Plan Note (Addendum)
05/04/2015   Walked RA x one lap @ 185 stopped due to  Back pain no sob or desat  So this is clearly multifactorial, 02 not needed

## 2015-05-05 NOTE — Assessment & Plan Note (Addendum)
PFT's  03/18/08   FEV1 1.65 (88 % ) ratio 52  p 0 % improvement from saba with DLCO  71 % corrects to 83  % for alv volume  - Spirometry 05/04/2015  Not reproducible, f/v loop not physiolgic  Repeated today but f/v not physiologic but despite continued smoking x 7 years x the last study I doubt she's progressed beyond GOLD II at this point and probably  could tolerate AVR if she'll agree to it.   - The proper method of use, as well as anticipated side effects, of a metered-dose inhaler are discussed and demonstrated to the patient. Improved effectiveness after extensive coaching during this visit to a level of approximately 75 % from a baseline of 50 %  > no change symbicort 160 2bid for now though may be a good candidate for lama add on or just lama/laba like bevespi since same technique as symbicort  I had an extended discussion with the patient and daughter reviewing all relevant studies completed to date and  lasting 35  minutes of a 60 minute visit    Each maintenance medication was reviewed in detail including most importantly the difference between maintenance and prns and under what circumstances the prns are to be triggered using an action plan format that is not reflected in the computer generated alphabetically organized AVS.    Please see instructions for details which were reviewed in writing and the patient given a copy highlighting the part that I personally wrote and discussed at today's ov.

## 2015-05-05 NOTE — Assessment & Plan Note (Signed)
Echo 09/30/13: Left ventricle: The cavity size was normal. Systolic function was vigorous. The estimated ejection fraction was in the range of 65% to 70%. Wall motion was normal; there were no regional wall motion abnormalities. - Aortic valve: Mildly to moderately calcified annulus. Trileaflet; mildly thickened, mildly calcified leaflets. Cusp separation was reduced. Transvalvular velocity was abnormal, due to stenosis. There was moderate stenosis. Peak velocity (S): 333 cm/s. Mean gradient (S): 28 mm Hg. Valve area (VTI): 1.56 cm^2. Valve area (Vmax): 1.36 cm^2. - Mitral valve: Calcified annulus. ECHO  04/26/15 Left ventricle: The cavity size was normal. Wall thickness was  normal. Systolic function was normal. The estimated ejection  fraction was in the range of 60% to 65%. Wall motion was normal;  there were no regional wall motion abnormalities. Doppler  parameters are consistent with abnormal left ventricular  relaxation (grade 1 diastolic dysfunction). Doppler parameters  are consistent with high ventricular filling pressure. - Aortic valve: There was severe stenosis. There was trivial  regurgitation. Mean gradient (S): 44 mm Hg. Peak gradient (S): 71  mm Hg. Valve area (VTI): 0.75 cm^2. Valve area (Vmax): 0.78 cm^2.  Valve area (Vmean): 0.75 cm^2. - Mitral valve: Calcified annulus.>>> referred to cards    This is the likely cause of doe in the absence of cough or any other feature to suggest worsening copd and advised there is no medical treatment that is likely to improve sob in this setting and other potential interventions will need to be considered in this context though she has already leaning strongly against avr.

## 2015-05-05 NOTE — Assessment & Plan Note (Signed)

## 2015-05-09 ENCOUNTER — Ambulatory Visit (INDEPENDENT_AMBULATORY_CARE_PROVIDER_SITE_OTHER): Payer: Commercial Managed Care - HMO | Admitting: Internal Medicine

## 2015-05-09 ENCOUNTER — Encounter: Payer: Self-pay | Admitting: Internal Medicine

## 2015-05-09 VITALS — BP 120/80 | HR 113 | Temp 98.7°F | Resp 20 | Wt 133.0 lb

## 2015-05-09 DIAGNOSIS — Z0189 Encounter for other specified special examinations: Secondary | ICD-10-CM

## 2015-05-09 DIAGNOSIS — F32A Depression, unspecified: Secondary | ICD-10-CM

## 2015-05-09 DIAGNOSIS — Z0000 Encounter for general adult medical examination without abnormal findings: Secondary | ICD-10-CM

## 2015-05-09 DIAGNOSIS — I350 Nonrheumatic aortic (valve) stenosis: Secondary | ICD-10-CM | POA: Diagnosis not present

## 2015-05-09 DIAGNOSIS — E785 Hyperlipidemia, unspecified: Secondary | ICD-10-CM

## 2015-05-09 DIAGNOSIS — J449 Chronic obstructive pulmonary disease, unspecified: Secondary | ICD-10-CM | POA: Diagnosis not present

## 2015-05-09 DIAGNOSIS — R918 Other nonspecific abnormal finding of lung field: Secondary | ICD-10-CM

## 2015-05-09 DIAGNOSIS — F329 Major depressive disorder, single episode, unspecified: Secondary | ICD-10-CM | POA: Diagnosis not present

## 2015-05-09 DIAGNOSIS — J984 Other disorders of lung: Secondary | ICD-10-CM

## 2015-05-09 NOTE — Patient Instructions (Signed)
Please continue all other medications as before, and refills have been done if requested.  Please have the pharmacy call with any other refills you may need.  Please continue your efforts at being more active, low cholesterol diet, and weight control.  You are otherwise up to date with prevention measures today.  Please keep your appointments with your specialists as you may have planned  Please return in 6 months, or sooner if needed 

## 2015-05-09 NOTE — Progress Notes (Signed)
Subjective:    Patient ID: Karina Andrews, female    DOB: 04-11-1930, 82 y.o.   MRN: [id number]  HPI  Here for wellness and f/u with her daughter;  Overall doing ok;  Pt denies Chest pain, wheezing, orthopnea, PND, worsening LE edema, palpitations, dizziness or syncope, but has persistent sob/doe no change.  Has seen pulm with copd, but symptom limiting factor more likely the severe AS.  Has card appt..  Pt denies neurological change such as new headache, facial or extremity weakness.  Pt denies polydipsia, polyuria, or low sugar symptoms. Pt states overall good compliance with treatment and medications, good tolerability, and has been trying to follow appropriate diet.  Pt denies worsening depressive symptoms, suicidal ideation but has had several mild panic episodes, much improved with benzo as rx.   No fever, night sweats, wt loss, loss of appetite, or other constitutional symptoms.  Pt states good ability with ADL's, has low fall risk, home safety reviewed and adequate, no other significant changes in hearing or vision, and only occasionally active with exercise.  Tolerating new statin.  No specific eval of pulm nodules at this time, for f/U at 3 Andrews Past Medical History  Diagnosis Date  . Hypertension   . Depression   . Osteoporosis     (R) hip fx 11/2009 & (L) hip fx 2006  . COPD (chronic obstructive pulmonary disease) (HCC)   . Cervical spondylosis with myelopathy 01/2009    s/p decompression  . Aortic stenosis     moderate on echo 09/2013   Past Surgical History  Procedure Laterality Date  . Bladder tuck    . Cholecystectomy  1995  . Posterior laminectomy / decompression cervical spine  01/2009    Done by Dr. Danielle Dess  . Right hip  11/2009    ORIF/ Done by Dr. Turner Daniels  . Cataract extraction Right 06/06/13    reports that she has been smoking Cigarettes.  She has a 26.5 pack-year smoking history. She has never used smokeless tobacco. She reports that she does not drink alcohol or use  illicit drugs. family history includes Heart attack in her father. No Known Allergies Current Outpatient Prescriptions on File Prior to Visit  Medication Sig Dispense Refill  . albuterol (VENTOLIN HFA) 108 (90 Base) MCG/ACT inhaler Inhale 2 puffs into the lungs every 6 (six) hours as needed for wheezing or shortness of breath. 18 g 0  . budesonide-formoterol (SYMBICORT) 160-4.5 MCG/ACT inhaler Inhale 2 puffs into the lungs 2 (two) times daily. 10.2 g 11  . hydrOXYzine (ATARAX/VISTARIL) 50 MG tablet 1/2 - 1 tab by mouth three times per day as needed 60 tablet 1  . losartan (COZAAR) 50 MG tablet Take 1 tablet (50 mg total) by mouth daily. 90 tablet 3  . rosuvastatin (CRESTOR) 10 MG tablet Take 1 tablet (10 mg total) by mouth daily. 90 tablet 3  . varenicline (CHANTIX CONTINUING MONTH PAK) 1 MG tablet Take 1 tablet (1 mg total) by mouth 2 (two) times daily. 60 tablet 3   No current facility-administered medications on file prior to visit.   Review of Systems Constitutional: Negative for increased diaphoresis, other activity, appetite or siginficant weight change other than noted HENT: Negative for worsening hearing loss, ear pain, facial swelling, mouth sores and neck stiffness.   Eyes: Negative for other worsening pain, redness or visual disturbance.  Respiratory: Negative for shortness of breath and wheezing  Cardiovascular: Negative for chest pain and palpitations.  Gastrointestinal: Negative for diarrhea,  blood in stool, abdominal distention or other pain Genitourinary: Negative for hematuria, flank pain or change in urine volume.  Musculoskeletal: Negative for myalgias or other joint complaints.  Skin: Negative for color change and wound or drainage.  Neurological: Negative for syncope and numbness. other than noted Hematological: Negative for adenopathy. or other swelling Psychiatric/Behavioral: Negative for hallucinations, SI, self-injury, decreased concentration or other worsening  agitation.      Objective:   Physical Exam BP 120/80 mmHg  Pulse 113  Temp(Src) 98.7 F (37.1 C) (Oral)  Resp 20  Wt 133 lb (60.328 kg)  SpO2 94% VS noted,  Constitutional: Pt is oriented to person, place, and time. Appears well-developed and well-nourished, in no significant distress Head: Normocephalic and atraumatic.  Right Ear: External ear normal.  Left Ear: External ear normal.  Nose: Nose normal.  Mouth/Throat: Oropharynx is clear and moist.  Eyes: Conjunctivae and EOM are normal. Pupils are equal, round, and reactive to light.  Neck: Normal range of motion. Neck supple. No JVD present. No tracheal deviation present or significant neck LA or mass Cardiovascular: Normal rate, regular rhythm, normal heart sounds and intact distal pulses.  with gr 2-3 syst murmur RUSB Pulmonary/Chest: Effort normal and breath sounds without rales or wheezing  Abdominal: Soft. Bowel sounds are normal. NT. No HSM  Musculoskeletal: Normal range of motion. Exhibits no edema.  Lymphadenopathy:  Has no cervical adenopathy.  Neurological: Pt is alert and oriented to person, place, and time. Pt has normal reflexes. No cranial nerve deficit. Motor grossly intact Skin: Skin is warm and dry. No rash noted.  Psychiatric:  Has mod nervous mood and affect. Behavior is normal. not depressed affect    Assessment & Plan:

## 2015-05-09 NOTE — Progress Notes (Signed)
Pre visit review using our clinic review tool, if applicable. No additional management support is needed unless otherwise documented below in the visit note. 

## 2015-05-11 ENCOUNTER — Ambulatory Visit: Payer: Commercial Managed Care - HMO | Admitting: Cardiology

## 2015-05-11 DIAGNOSIS — R918 Other nonspecific abnormal finding of lung field: Secondary | ICD-10-CM | POA: Insufficient documentation

## 2015-05-11 DIAGNOSIS — Z0000 Encounter for general adult medical examination without abnormal findings: Secondary | ICD-10-CM | POA: Insufficient documentation

## 2015-05-11 NOTE — Assessment & Plan Note (Signed)
Tolerating statin, to cont, f/u lab next visit Lab Results  Component Value Date   CHOL 260* 04/11/2015   HDL 92.50 04/11/2015   LDLCALC 148* 04/11/2015   LDLDIRECT 189.2 12/12/2008   TRIG 98.0 04/11/2015   CHOLHDL 3 04/11/2015

## 2015-05-11 NOTE — Assessment & Plan Note (Signed)
stable overall by history and exam, except for recent panic situational related to tests and eval recent, and pt to continue medical treatment as before,  to f/u any worsening symptoms or concerns

## 2015-05-11 NOTE — Assessment & Plan Note (Signed)
stable overall by history and exam, recent data reviewed with pt, and pt to continue medical treatment as before,  to f/u any worsening symptoms or concerns SpO2 Readings from Last 3 Encounters:  05/09/15 94%  05/04/15 93%  04/11/15 93%   Urged to quit smoking

## 2015-05-11 NOTE — Assessment & Plan Note (Signed)
Currently her most significant issue but overall stable and tolerable level of sob/doe; no syncope or worsening dizziness, to see card as planned

## 2015-05-11 NOTE — Assessment & Plan Note (Signed)
Of unclear significance, for f/u with pulm at 3 mo for f/u imaging to asses change in size

## 2015-05-17 ENCOUNTER — Ambulatory Visit (INDEPENDENT_AMBULATORY_CARE_PROVIDER_SITE_OTHER): Payer: Commercial Managed Care - HMO | Admitting: Cardiology

## 2015-05-17 ENCOUNTER — Encounter: Payer: Self-pay | Admitting: Cardiology

## 2015-05-17 VITALS — BP 148/72 | HR 106 | Ht 64.0 in | Wt 133.0 lb

## 2015-05-17 DIAGNOSIS — I10 Essential (primary) hypertension: Secondary | ICD-10-CM | POA: Diagnosis not present

## 2015-05-17 DIAGNOSIS — R471 Dysarthria and anarthria: Secondary | ICD-10-CM | POA: Diagnosis not present

## 2015-05-17 DIAGNOSIS — I350 Nonrheumatic aortic (valve) stenosis: Secondary | ICD-10-CM

## 2015-05-17 DIAGNOSIS — R0600 Dyspnea, unspecified: Secondary | ICD-10-CM

## 2015-05-17 NOTE — Patient Instructions (Signed)
Medication Instructions:  Your physician recommends that you continue on your current medications as directed. Please refer to the Current Medication list given to you today.   Labwork: None  Testing/Procedures: Dr. Mayford Knifeurner recommends you have a BRAIN MRI.  Follow-Up: You have been referred to NEUROLOGY ASAP for dysarthria.   Your physician wants you to follow-up in: 6 months with Dr. Mayford Knifeurner. You will receive a reminder letter in the mail two months in advance. If you don't receive a letter, please call our office to schedule the follow-up appointment.   Any Other Special Instructions Will Be Listed Below (If Applicable).     If you need a refill on your cardiac medications before your next appointment, please call your pharmacy.

## 2015-05-17 NOTE — Progress Notes (Signed)
Cardiology Office Note   Date:  05/17/2015   ID:  Karina Andrews, DOB 04-10-1930, MRN [id number]  PCP:  Oliver Barre, MD    No chief complaint on file.     History of Present Illness: Karina Andrews is a 82 y.o. female who presents for Aortic stenosis.  She has a history of COPD, HTN and moderate AS by echo in 09/2013.  She recently saw her PCP and was complaining of chronic SOB/DOE with ambulation that has worsened over the past 6-8 weeks.  She denies any chest pain or pressure, LE edema, dizziness or syncope.  She is followed by pulmonary for her COPD.  She had a 2D echo earlier this month which showed normal LVF with EF 60-65^, grade 1 diastolic dysfunction and severe AS with mean AVG and AVA calculated at 0.78cm2.  She uses a walker to ambulate.  She continues to smoke .  She is very active and goes shopping without any problems and drives on her own.      Past Medical History  Diagnosis Date  . Hypertension   . Depression   . Osteoporosis     (R) hip fx 11/2009 & (L) hip fx 2006  . COPD (chronic obstructive pulmonary disease) (HCC)   . Cervical spondylosis with myelopathy 01/2009    s/p decompression  . Aortic stenosis     moderate on echo 09/2013    Past Surgical History  Procedure Laterality Date  . Bladder tuck    . Cholecystectomy  1995  . Posterior laminectomy / decompression cervical spine  01/2009    Done by Dr. Danielle Dess  . Right hip  11/2009    ORIF/ Done by Dr.  Daniels  . Cataract extraction Right 06/06/13     Current Outpatient Prescriptions  Medication Sig Dispense Refill  . albuterol (VENTOLIN HFA) 108 (90 Base) MCG/ACT inhaler Inhale 2 puffs into the lungs every 6 (six) hours as needed for wheezing or shortness of breath. 18 g 0  . budesonide-formoterol (SYMBICORT) 160-4.5 MCG/ACT inhaler Inhale 2 puffs into the lungs 2 (two) times daily. 10.2 g 11  . hydrOXYzine (ATARAX/VISTARIL) 50 MG tablet 1/2 - 1 tab by mouth three times  per day as needed 60 tablet 1  . losartan (COZAAR) 50 MG tablet Take 1 tablet (50 mg total) by mouth daily. 90 tablet 3  . rosuvastatin (CRESTOR) 10 MG tablet Take 1 tablet (10 mg total) by mouth daily. 90 tablet 3  . varenicline (CHANTIX CONTINUING MONTH PAK) 1 MG tablet Take 1 tablet (1 mg total) by mouth 2 (two) times daily. 60 tablet 3   No current facility-administered medications for this visit.    Allergies:   Review of patient's allergies indicates no known allergies.    Social History:  The patient  reports that she has been smoking Cigarettes.  She has a 26.5 pack-year smoking history. She has never used smokeless tobacco. She reports that she does not drink alcohol or use illicit drugs.   Family History:  The patient's family history includes Heart attack in her father.    ROS:  Please see the history of present illness.   Otherwise, review of systems are positive for none.   All other systems are reviewed and negative.    PHYSICAL EXAM: VS:  BP 148/72 mmHg  Pulse 106  Ht  (1.626 m)  Wt 133 lb (60.328  kg)  BMI 22.82 kg/m2 , BMI Body mass index is 22.82 kg/(m^2). GEN: Well nourished, well developed, in no acute distress HEENT: normal Neck: no JVD, carotid bruits, or masses.  2/6 late peaking systolic murmur at RUSB to LLSB with no S2 Cardiac: RRR; no rubs, or gallops,no edema  Respiratory:  clear to auscultation bilaterally, normal work of breathing GI: soft, nontender, nondistended, + BS MS: no deformity or atrophy Skin: warm and dry, no rash Neuro:  Strength and sensation are intact Psych: euthymic mood, full affect   EKG:  EKG is ordered today. The ekg ordered today demonstrates sinus tachcyardia at 106bpm with no ST changes, cannot rule out anterior infarct   Recent Labs: 04/11/2015: ALT 20; BUN 17; Creatinine, Ser 0.72; Hemoglobin 13.9; Platelets 180.0; Potassium 4.1; Pro B Natriuretic peptide (BNP) 33.0; Sodium 140; TSH 1.92    Lipid Panel      Component Value Date/Time   CHOL 260* 04/11/2015 1731   TRIG 98.0 04/11/2015 1731   HDL 92.50 04/11/2015 1731   CHOLHDL 3 04/11/2015 1731   VLDL 19.6 04/11/2015 1731   LDLCALC 148* 04/11/2015 1731   LDLDIRECT 189.2 12/12/2008 1025      Wt Readings from Last 3 Encounters:  05/17/15 133 lb (60.328 kg)  05/09/15 133 lb (60.328 kg)  05/04/15 134 lb 12.8 oz (61.145 kg)        ASSESSMENT AND PLAN:  1.  Chronic SOB/DOE most likely multifactorial from AS, sedentary state, COPD.  2.  Moderate AS by echo 2015. Repeat 2D echo now showing severe AS.  She is having increased SOB and DOE as well as fatigue that are starting to limit her activity.  I do not think she would be a good candidate from open AVR due to underlying COPD and debilitated state but may be a candidate for TAVR.  I am concerned about the difficulty with speech she has been having which according to her daughter has become progressive.  I will get a MRI/MRA of her head and refer to Neurology for evaluation.  If she is cleared by them for AVR, I will refer to Dr. Excell Seltzerooper for further evaluation.  I have discussed this at length with her daughter and patient.   3.  HTN - controlled on losartan.  I have instructed her to continue this.   4.  Dysarthria - refer to Neuro   Current medicines are reviewed at length with the patient today.  The patient does not have concerns regarding medicines.  The following changes have been made:  no change  Labs/ tests ordered today: See above Assessment and Plan No orders of the defined types were placed in this encounter.     Disposition:   FU with me in 6 months  Signed, Quintella ReichertURNER,Sofia Jaquith R, MD  05/17/2015 2:18 PM    [facility] [address], [place], [place]  [address] Phone: [phone]; Fax: [phone]

## 2015-05-22 ENCOUNTER — Institutional Professional Consult (permissible substitution): Payer: Commercial Managed Care - HMO | Admitting: Pulmonary Disease

## 2015-05-23 ENCOUNTER — Ambulatory Visit (HOSPITAL_COMMUNITY)
Admission: RE | Admit: 2015-05-23 | Discharge: 2015-05-23 | Disposition: A | Discharge status: Home / Self Care | Payer: Commercial Managed Care - HMO | Source: Ambulatory Visit | Attending: Cardiology | Admitting: Cardiology

## 2015-05-23 ENCOUNTER — Ambulatory Visit: Payer: Commercial Managed Care - HMO | Admitting: Internal Medicine

## 2015-05-23 DIAGNOSIS — G319 Degenerative disease of nervous system, unspecified: Secondary | ICD-10-CM | POA: Insufficient documentation

## 2015-05-23 DIAGNOSIS — R471 Dysarthria and anarthria: Secondary | ICD-10-CM | POA: Diagnosis not present

## 2015-05-23 MED ORDER — GADOBENATE DIMEGLUMINE 529 MG/ML IV SOLN
15.0000 mL | Freq: Once | INTRAVENOUS | Status: AC | PRN
  Administered 2015-05-23: 12 mL via INTRAVENOUS

## 2015-05-24 ENCOUNTER — Ambulatory Visit: Payer: Commercial Managed Care - HMO | Admitting: Cardiology

## 2015-05-26 DIAGNOSIS — W19XXXA Unspecified fall, initial encounter: Secondary | ICD-10-CM

## 2015-05-26 HISTORY — DX: Unspecified place in unspecified non-institutional (private) residence as the place of occurrence of the external cause: W19.XXXA

## 2015-05-30 ENCOUNTER — Ambulatory Visit (INDEPENDENT_AMBULATORY_CARE_PROVIDER_SITE_OTHER): Payer: Commercial Managed Care - HMO | Admitting: Neurology

## 2015-05-30 ENCOUNTER — Telehealth: Payer: Self-pay | Admitting: Cardiology

## 2015-05-30 ENCOUNTER — Emergency Department (HOSPITAL_COMMUNITY): Payer: Commercial Managed Care - HMO

## 2015-05-30 ENCOUNTER — Encounter: Payer: Self-pay | Admitting: Neurology

## 2015-05-30 ENCOUNTER — Inpatient Hospital Stay (HOSPITAL_COMMUNITY)
Admission: EM | Admit: 2015-05-30 | Discharge: 2015-06-05 | DRG: 184 | Disposition: A | Discharge status: Skilled Nursing Facility | Payer: Commercial Managed Care - HMO | Attending: Internal Medicine | Admitting: Internal Medicine

## 2015-05-30 VITALS — BP 150/100 | HR 96 | Resp 20 | Ht 62.0 in | Wt 136.0 lb

## 2015-05-30 DIAGNOSIS — F0390 Unspecified dementia without behavioral disturbance: Secondary | ICD-10-CM | POA: Diagnosis present

## 2015-05-30 DIAGNOSIS — Y92009 Unspecified place in unspecified non-institutional (private) residence as the place of occurrence of the external cause: Secondary | ICD-10-CM

## 2015-05-30 DIAGNOSIS — S2231XA Fracture of one rib, right side, initial encounter for closed fracture: Secondary | ICD-10-CM | POA: Diagnosis present

## 2015-05-30 DIAGNOSIS — I10 Essential (primary) hypertension: Secondary | ICD-10-CM | POA: Diagnosis present

## 2015-05-30 DIAGNOSIS — Z66 Do not resuscitate: Secondary | ICD-10-CM | POA: Diagnosis present

## 2015-05-30 DIAGNOSIS — T148 Other injury of unspecified body region: Secondary | ICD-10-CM | POA: Diagnosis not present

## 2015-05-30 DIAGNOSIS — Z961 Presence of intraocular lens: Secondary | ICD-10-CM | POA: Diagnosis present

## 2015-05-30 DIAGNOSIS — Z8249 Family history of ischemic heart disease and other diseases of the circulatory system: Secondary | ICD-10-CM | POA: Diagnosis not present

## 2015-05-30 DIAGNOSIS — Y92230 Patient room in hospital as the place of occurrence of the external cause: Secondary | ICD-10-CM | POA: Diagnosis not present

## 2015-05-30 DIAGNOSIS — R259 Unspecified abnormal involuntary movements: Secondary | ICD-10-CM | POA: Diagnosis not present

## 2015-05-30 DIAGNOSIS — Z9049 Acquired absence of other specified parts of digestive tract: Secondary | ICD-10-CM | POA: Diagnosis not present

## 2015-05-30 DIAGNOSIS — S42191D Fracture of other part of scapula, right shoulder, subsequent encounter for fracture with routine healing: Secondary | ICD-10-CM | POA: Diagnosis not present

## 2015-05-30 DIAGNOSIS — S12001D Unspecified nondisplaced fracture of first cervical vertebra, subsequent encounter for fracture with routine healing: Secondary | ICD-10-CM | POA: Diagnosis not present

## 2015-05-30 DIAGNOSIS — S2231XD Fracture of one rib, right side, subsequent encounter for fracture with routine healing: Secondary | ICD-10-CM | POA: Diagnosis not present

## 2015-05-30 DIAGNOSIS — S01111A Laceration without foreign body of right eyelid and periocular area, initial encounter: Secondary | ICD-10-CM | POA: Diagnosis not present

## 2015-05-30 DIAGNOSIS — S2241XD Multiple fractures of ribs, right side, subsequent encounter for fracture with routine healing: Secondary | ICD-10-CM | POA: Diagnosis not present

## 2015-05-30 DIAGNOSIS — D696 Thrombocytopenia, unspecified: Secondary | ICD-10-CM | POA: Diagnosis present

## 2015-05-30 DIAGNOSIS — F17200 Nicotine dependence, unspecified, uncomplicated: Secondary | ICD-10-CM | POA: Diagnosis present

## 2015-05-30 DIAGNOSIS — E785 Hyperlipidemia, unspecified: Secondary | ICD-10-CM | POA: Diagnosis present

## 2015-05-30 DIAGNOSIS — S0242XD Fracture of alveolus of maxilla, subsequent encounter for fracture with routine healing: Secondary | ICD-10-CM | POA: Diagnosis not present

## 2015-05-30 DIAGNOSIS — S299XXA Unspecified injury of thorax, initial encounter: Secondary | ICD-10-CM | POA: Diagnosis not present

## 2015-05-30 DIAGNOSIS — I350 Nonrheumatic aortic (valve) stenosis: Secondary | ICD-10-CM | POA: Diagnosis present

## 2015-05-30 DIAGNOSIS — M810 Age-related osteoporosis without current pathological fracture: Secondary | ICD-10-CM | POA: Diagnosis present

## 2015-05-30 DIAGNOSIS — E876 Hypokalemia: Secondary | ICD-10-CM | POA: Diagnosis present

## 2015-05-30 DIAGNOSIS — H1130 Conjunctival hemorrhage, unspecified eye: Secondary | ICD-10-CM | POA: Diagnosis not present

## 2015-05-30 DIAGNOSIS — W010XXA Fall on same level from slipping, tripping and stumbling without subsequent striking against object, initial encounter: Secondary | ICD-10-CM | POA: Diagnosis present

## 2015-05-30 DIAGNOSIS — H353130 Nonexudative age-related macular degeneration, bilateral, stage unspecified: Secondary | ICD-10-CM | POA: Diagnosis not present

## 2015-05-30 DIAGNOSIS — F419 Anxiety disorder, unspecified: Secondary | ICD-10-CM

## 2015-05-30 DIAGNOSIS — S12000A Unspecified displaced fracture of first cervical vertebra, initial encounter for closed fracture: Secondary | ICD-10-CM | POA: Diagnosis not present

## 2015-05-30 DIAGNOSIS — S42191A Fracture of other part of scapula, right shoulder, initial encounter for closed fracture: Secondary | ICD-10-CM | POA: Diagnosis not present

## 2015-05-30 DIAGNOSIS — F329 Major depressive disorder, single episode, unspecified: Secondary | ICD-10-CM | POA: Diagnosis present

## 2015-05-30 DIAGNOSIS — Z720 Tobacco use: Secondary | ICD-10-CM | POA: Diagnosis not present

## 2015-05-30 DIAGNOSIS — R278 Other lack of coordination: Secondary | ICD-10-CM | POA: Diagnosis not present

## 2015-05-30 DIAGNOSIS — S0990XA Unspecified injury of head, initial encounter: Secondary | ICD-10-CM | POA: Diagnosis not present

## 2015-05-30 DIAGNOSIS — D649 Anemia, unspecified: Secondary | ICD-10-CM | POA: Diagnosis present

## 2015-05-30 DIAGNOSIS — Z515 Encounter for palliative care: Secondary | ICD-10-CM | POA: Insufficient documentation

## 2015-05-30 DIAGNOSIS — R471 Dysarthria and anarthria: Secondary | ICD-10-CM | POA: Diagnosis present

## 2015-05-30 DIAGNOSIS — Z79899 Other long term (current) drug therapy: Secondary | ICD-10-CM | POA: Diagnosis not present

## 2015-05-30 DIAGNOSIS — F17210 Nicotine dependence, cigarettes, uncomplicated: Secondary | ICD-10-CM | POA: Diagnosis present

## 2015-05-30 DIAGNOSIS — F10239 Alcohol dependence with withdrawal, unspecified: Secondary | ICD-10-CM | POA: Diagnosis not present

## 2015-05-30 DIAGNOSIS — M5000 Cervical disc disorder with myelopathy, unspecified cervical region: Secondary | ICD-10-CM | POA: Diagnosis present

## 2015-05-30 DIAGNOSIS — F1010 Alcohol abuse, uncomplicated: Secondary | ICD-10-CM | POA: Diagnosis present

## 2015-05-30 DIAGNOSIS — J441 Chronic obstructive pulmonary disease with (acute) exacerbation: Secondary | ICD-10-CM | POA: Diagnosis present

## 2015-05-30 DIAGNOSIS — R918 Other nonspecific abnormal finding of lung field: Secondary | ICD-10-CM

## 2015-05-30 DIAGNOSIS — W19XXXA Unspecified fall, initial encounter: Secondary | ICD-10-CM | POA: Diagnosis present

## 2015-05-30 DIAGNOSIS — W06XXXA Fall from bed, initial encounter: Secondary | ICD-10-CM | POA: Diagnosis not present

## 2015-05-30 DIAGNOSIS — R0781 Pleurodynia: Secondary | ICD-10-CM | POA: Diagnosis not present

## 2015-05-30 DIAGNOSIS — S2239XA Fracture of one rib, unspecified side, initial encounter for closed fracture: Secondary | ICD-10-CM

## 2015-05-30 DIAGNOSIS — S0240CA Maxillary fracture, right side, initial encounter for closed fracture: Secondary | ICD-10-CM | POA: Diagnosis not present

## 2015-05-30 DIAGNOSIS — S12090A Other displaced fracture of first cervical vertebra, initial encounter for closed fracture: Secondary | ICD-10-CM | POA: Diagnosis not present

## 2015-05-30 DIAGNOSIS — Z7189 Other specified counseling: Secondary | ICD-10-CM | POA: Insufficient documentation

## 2015-05-30 DIAGNOSIS — S02401A Maxillary fracture, unspecified, initial encounter for closed fracture: Secondary | ICD-10-CM

## 2015-05-30 DIAGNOSIS — S2241XA Multiple fractures of ribs, right side, initial encounter for closed fracture: Secondary | ICD-10-CM | POA: Diagnosis present

## 2015-05-30 DIAGNOSIS — Z9181 History of falling: Secondary | ICD-10-CM | POA: Diagnosis not present

## 2015-05-30 DIAGNOSIS — H1131 Conjunctival hemorrhage, right eye: Secondary | ICD-10-CM | POA: Diagnosis not present

## 2015-05-30 DIAGNOSIS — M6281 Muscle weakness (generalized): Secondary | ICD-10-CM | POA: Diagnosis not present

## 2015-05-30 DIAGNOSIS — E860 Dehydration: Secondary | ICD-10-CM | POA: Diagnosis present

## 2015-05-30 DIAGNOSIS — R600 Localized edema: Secondary | ICD-10-CM | POA: Diagnosis not present

## 2015-05-30 DIAGNOSIS — R69 Illness, unspecified: Secondary | ICD-10-CM | POA: Diagnosis not present

## 2015-05-30 DIAGNOSIS — R262 Difficulty in walking, not elsewhere classified: Secondary | ICD-10-CM | POA: Diagnosis not present

## 2015-05-30 DIAGNOSIS — S0231XA Fracture of orbital floor, right side, initial encounter for closed fracture: Secondary | ICD-10-CM | POA: Diagnosis not present

## 2015-05-30 DIAGNOSIS — S0182XA Laceration with foreign body of other part of head, initial encounter: Secondary | ICD-10-CM | POA: Diagnosis not present

## 2015-05-30 HISTORY — DX: Alcohol abuse, uncomplicated: F10.10

## 2015-05-30 MED ORDER — IBUPROFEN 400 MG PO TABS: 400.0000 mg | ORAL_TABLET | Freq: Four times a day (QID) | ORAL | Status: DC

## 2015-05-30 MED ORDER — OXYCODONE-ACETAMINOPHEN 5-325 MG PO TABS: 1.0000 | ORAL_TABLET | Freq: Four times a day (QID) | ORAL | Status: DC | PRN

## 2015-05-30 MED ORDER — FENTANYL CITRATE (PF) 100 MCG/2ML IJ SOLN
100.0000 ug | Freq: Once | INTRAMUSCULAR | Status: AC
  Administered 2015-05-31: 100 ug via INTRAVENOUS
  Filled 2015-05-30: qty 2

## 2015-05-30 MED ORDER — HYDROCODONE-ACETAMINOPHEN 5-325 MG PO TABS
1.0000 | ORAL_TABLET | Freq: Once | ORAL | Status: AC
  Administered 2015-05-30: 1 via ORAL
  Filled 2015-05-30: qty 1

## 2015-05-30 NOTE — Progress Notes (Signed)
[facility]   Provider:  Melvyn Novasarmen  Emeril Stille, M D  Referring Provider: Carolanne Grumblingracy Turner, MD  Primary Care Physician:  Oliver BarreJames John, MD  Chief Complaint  Patient presents with  . New Patient (Initial Visit)    wants to be cleared for aortic surgery stenosis, also some dysarthria, has had an MRI, rm 10, with daughter    HPI:  [name] is a 82 y.o. female , seen here as a referral from Dr. Mayford Knifeurner to see if her neurologic status would be conducive to repair her aortic stenosis. [name] is 82 years old but suffers from increasing increasing shortness of breath. She also has trouble with ambulation and she uses a walker. She has a history of COPD, hypertension which is variable day by day and was in 2015 evaluated by an echocardiogram for aortic stenosis and at that time the degree was named as moderate. She is followed via pulmonologist for her COPD. Over the last 6-8 weeks she has gradually declined. A new 2-D echo now showed a left ventricular function with an ejection fraction of 60-65, grade 1 diastolic dysfunction and severe aortic stenosis which means that the blood pressures are often at peak. She continues to smoke she still very active and likes to go shopping but she has to use a walker now. She is wanting to quit smoking with a new prescription of Chantix.  The patient is currently living alone and independent, in July of this year she plans to move into a friend's home for independent living.  Chief complaint according to patient : I need surgery.  Her most recent lung and pulmonary scan showed a or several smaller nodules that are considered benign but the patient is concerned that they may still develop into cancer. I've explained to her that I have not seen those chest related images. I have reviewed her MRI of the head. I am surprised that the patient was able to sustain the length of this test. Her MRI was compared to a CT from 2010. There was no stroke sign. The  brain shows generalized atrophy mainly referring to chronic small vessel ischemic origin. These are chronic small vessel changes throughout the subcortical white matter and also throughout the basal ganglia. There is no mass lesion, no bleed, no abnormal enhancement. I would consider the atrophy slightly progressed for age. However with the chronic small vessel ischemia this may have contributed to the atrophic changes as well. FINDINGS: Diffusion imaging does not show any acute or subacute infarction. The brain shows generalized atrophy. There chronic small-vessel ischemic changes of the pons. No focal cerebellar insult. The cerebral hemispheres show confluent chronic small vessel ischemic changes throughout the deep and subcortical white matter. Basal ganglia and thalami also show chronic small-vessel ischemic changes. No cortical or large vessel territory infarction. No mass lesion, hemorrhage, hydrocephalus or extra-axial collection. After contrast administration, no abnormal enhancement occurs.  IMPRESSION: Atrophy and chronic small vessel ischemic changes throughout the brain. No acute or reversible finding.   Electronically Signed  By: Paulina FusiMark Shogry M.D.  On: 05/23/2015 18:32      Result Notes     Notes Recorded by Melvyn Novasarmen Mikailah Morel, MD on 05/29/2015 at 11:45 AM Thank you French Anaracy, I had the opportunity to review the Images. Porfirio Mylararmen D. ------      Review of Systems: Out of a complete 14 system review, the patient complains of only the following symptoms, and all other reviewed systems are negative.  Over the last 2  years the patient has developed a staccato non-fluent speech and just over the last couple of months that has become progressive. This seems to be an air hungry and is interrupting her work flow. I'm not sure that there is a primary neurologic problem causing her dysarthria. She also can converse with me in both languages, [place] and [place]. She is hard of  hearing and that affects some of her answers but she does not have a limited vocabulary - in short this is not a primary progressive aphasia form.     Social History   Social History  . Marital Status: Single    Spouse Name: N/A  . Number of Children: N/A  . Years of Education: N/A   Occupational History  . Not on file.   Social History Main Topics  . Smoking status: Current Every Day Smoker -- 0.50 packs/day for 53 years    Types: Cigarettes  . Smokeless tobacco: Never Used  . Alcohol Use: No  . Drug Use: No  . Sexual Activity: Not on file   Other Topics Concern  . Not on file   Social History Narrative    Family History  Problem Relation Age of Onset  . Heart attack Father     Past Medical History  Diagnosis Date  . Hypertension   . Depression   . Osteoporosis     (R) hip fx 11/2009 & (L) hip fx 2006  . COPD (chronic obstructive pulmonary disease) (HCC)   . Cervical spondylosis with myelopathy 01/2009    s/p decompression  . Aortic stenosis     moderate on echo 09/2013    Past Surgical History  Procedure Laterality Date  . Bladder tuck    . Cholecystectomy  1995  . Posterior laminectomy / decompression cervical spine  01/2009    Done by Dr. Danielle Dess  . Right hip  11/2009    ORIF/ Done by Dr. Turner Daniels  . Cataract extraction Right 06/06/13    Current Outpatient Prescriptions  Medication Sig Dispense Refill  . albuterol (VENTOLIN HFA) 108 (90 Base) MCG/ACT inhaler Inhale 2 puffs into the lungs every 6 (six) hours as needed for wheezing or shortness of breath. 18 g 0  . budesonide-formoterol (SYMBICORT) 160-4.5 MCG/ACT inhaler Inhale 2 puffs into the lungs 2 (two) times daily. 10.2 g 11  . hydrOXYzine (ATARAX/VISTARIL) 50 MG tablet 1/2 - 1 tab by mouth three times per day as needed 60 tablet 1  . losartan (COZAAR) 50 MG tablet Take 1 tablet (50 mg total) by mouth daily. 90 tablet 3  . rosuvastatin (CRESTOR) 10 MG tablet Take 1 tablet (10 mg total) by mouth  daily. 90 tablet 3  . varenicline (CHANTIX CONTINUING MONTH PAK) 1 MG tablet Take 1 tablet (1 mg total) by mouth 2 (two) times daily. 60 tablet 3   No current facility-administered medications for this visit.    Allergies as of 05/30/2015  . (No Known Allergies)    Vitals: BP 150/100 mmHg  Pulse 96  Resp 20  Ht  (1.575 m)  Wt 136 lb (61.689 kg)  BMI 24.87 kg/m2 Last Weight:  Wt Readings from Last 1 Encounters:  05/30/15 136 lb (61.689 kg)   ZOX:WRUE mass index is 24.87 kg/(m^2).     Last Height:   Ht Readings from Last 1 Encounters:  05/30/15  (1.575 m)    Physical exam:  General: The patient is awake, alert and appears not in acute distress. The patient is well  groomed. Head: Normocephalic, atraumatic. Neck is supple. Mallampati 4, status post anterior cervical fusion.   neck circumference: 14. Nasal airflow , TMJ is not evident .  Cardiovascular:  Regular rate and rhythm, with tachycardia , ejection  murmurs and left carotid bruit. Respiratory: Lungs are clear to auscultation. Skin:  Without evidence of edema, or rash Trunk:  Neurologic exam : The patient is awake and alert, oriented to place and time.   Memory subjective described as intact.   Attention span & concentration ability appears normal.  Speech is fluent,  with dysarthria, with  dysphonia and not  aphasia.  Mood and affect are appropriate.  Cranial nerves: Pupils are equal and briskly reactive to light.  Funduscopic exam without evidence of pallor or edema. Extraocular movements in vertical and horizontal planes intact and without nystagmus. Visual fields by finger perimetry are intact. Hearing to finger rub intact. Facial sensation intact to fine touch. Facial motor strength is symmetric and tongue and uvula move midline. Shoulder shrug was symmetrical.   Motor exam: atrophy of  muscle bulk , but  symmetric strength in all extremities.  Sensory:  Fine touch, pinprick and vibration were tested  and intact !  Proprioception tested in the upper extremities was normal.  Coordination: Rapid alternating movements in the fingers/hands was normal. Finger-to-nose maneuver  normal without evidence of ataxia, dysmetria or tremor.  Gait and station: Patient walks with walker as device and is unable  To stand up from a seated position , unassisted to climb up to the exam table. Strength within normal limits.    Deep tendon reflexes: in the  upper and lower extremities are symmetric and intact. Babinski maneuver response is downgoing.  The patient was advised of the nature of the diagnosed disorder , the treatment options and risks for general a health and wellness arising from not treating the condition.  I spent more than 55 minutes of face to face time with the patient. Greater than 50% of time was spent in counseling and coordination of care. We have discussed the diagnosis and differential and I answered the patient's questions.     Assessment:  After physical and neurologic examination, review of laboratory studies,  Personal review of imaging studies, reports of other /same  Imaging studies, and pre-existing records as far as provided in visit., my assessment is :     1)   it is not difficult to see that Mrs. Simone has been in declining health at least over the last 6 month, but to this decline contributed the severity of her aortic stenosis. She is completely able to hold a conversation aside from hearing difficulties and is able to recall events and memorize them well. I have no evidence of an early dementia. I think that her dysphonia rather than dysarthria is related to her constant need to breath.   2) aortic stenosis has reached a severity that the alternative to no intervention would be a significant decrease and life quality and life expectancy. I agree that the intervention planned should be the least invasive and hopefully one that allows for a shorter time under general  anesthesia. Open heart surgery would not be recommended.  3) the patient is still smoking but she understands that her recovery is dependent on her to stay off tobacco. She stated that she has received a prescription for Chantix and I would like her to start immediately. Today should be her last day to smoke.     Plan:  Treatment plan  and additional workup :  Mrs. Hunnicutt is fully aware of her high comorbidity related risk for an aortic valve repair. She knows that she has a chance by undergoing a surgical intervention, but otherwise continuing to live with a lower life quality and probably with air anxiety. I find it remarkable that she's not using oxygen right now.  I take this is a good sign. I thanked Dr. Carolanne Grumbling for allowing me to participate in Mrs. Fahey's care and I certainly enjoyed meeting the patient and converse in german !       Porfirio Mylar Zavior Thomason MD  05/30/2015   CC: Corwin Levins, Md [address] [place], [place] [address]

## 2015-05-30 NOTE — Telephone Encounter (Signed)
NEW MESSAGE   [name] pt daughter is calling to inform Rn   Pt Is a candidate it for surgery

## 2015-05-30 NOTE — ED Notes (Signed)
Spoke with daughter Christinia and was concerned about returning home due to history of drinking alcohol and wanted her to be admitted. Call back number [phone]

## 2015-05-30 NOTE — ED Provider Notes (Signed)
CSN: [id number]     Arrival date & time 05/30/15  2118 History   First MD Initiated Contact with Patient 05/30/15 2146     Chief Complaint  Patient presents with  . Fall    right rib pain     (Consider location/radiation/quality/duration/timing/severity/associated sxs/prior Treatment) Patient is a 82 y.o. female presenting with fall.  Fall This is a new problem. The current episode started 1 to 2 hours ago. The problem has not changed since onset.Associated symptoms include chest pain (right lateral chest) and shortness of breath. Pertinent negatives include no abdominal pain. The symptoms are aggravated by sneezing, coughing and twisting. Nothing relieves the symptoms. She has tried nothing for the symptoms.    Past Medical History  Diagnosis Date  . Hypertension   . Depression   . Osteoporosis     (R) hip fx 11/2009 & (L) hip fx 2006  . COPD (chronic obstructive pulmonary disease) (HCC)   . Cervical spondylosis with myelopathy 01/2009    s/p decompression  . Aortic stenosis     moderate on echo 09/2013   Past Surgical History  Procedure Laterality Date  . Bladder tuck    . Cholecystectomy  1995  . Posterior laminectomy / decompression cervical spine  01/2009    Done by Dr. Danielle Dess  . Right hip  11/2009    ORIF/ Done by Dr. Turner Daniels  . Cataract extraction Right 06/06/13   Family History  Problem Relation Age of Onset  . Heart attack Father    Social History  Substance Use Topics  . Smoking status: Current Every Day Smoker -- 0.50 packs/day for 53 years    Types: Cigarettes  . Smokeless tobacco: Never Used  . Alcohol Use: No   OB History    No data available     Review of Systems  Respiratory: Positive for shortness of breath. Negative for cough.   Cardiovascular: Positive for chest pain (right lateral chest).  Gastrointestinal: Negative for nausea, abdominal pain and diarrhea.  Endocrine: Negative for polydipsia and polyuria.  Genitourinary: Negative for dysuria.   Skin: Negative for pallor and wound.  All other systems reviewed and are negative.     Allergies  Review of patient's allergies indicates no known allergies.  Home Medications   Prior to Admission medications   Medication Sig Start Date End Date Taking? Authorizing Provider  albuterol (VENTOLIN HFA) 108 (90 Base) MCG/ACT inhaler Inhale 2 puffs into the lungs every 6 (six) hours as needed for wheezing or shortness of breath. 04/20/15  Yes Pincus Sanes, MD  budesonide-formoterol (SYMBICORT) 160-4.5 MCG/ACT inhaler Inhale 2 puffs into the lungs 2 (two) times daily. 03/11/14  Yes Newt Lukes, MD  losartan (COZAAR) 50 MG tablet Take 1 tablet (50 mg total) by mouth daily. 09/30/14  Yes Newt Lukes, MD  rosuvastatin (CRESTOR) 10 MG tablet Take 1 tablet (10 mg total) by mouth daily. 04/13/15  Yes Corwin Levins, MD  tiotropium (SPIRIVA) 18 MCG inhalation capsule Place 18 mcg into inhaler and inhale daily.   Yes Historical Provider, MD  hydrOXYzine (ATARAX/VISTARIL) 50 MG tablet 1/2 - 1 tab by mouth three times per day as needed Patient taking differently: Take 25 mg by mouth every 6 (six) hours as needed for anxiety or itching.  04/30/15   Corwin Levins, MD  varenicline (CHANTIX CONTINUING MONTH PAK) 1 MG tablet Take 1 tablet (1 mg total) by mouth 2 (two) times daily. Patient not taking: Reported on 05/30/2015 06/02/14  Myrlene BrokerElizabeth A Crawford, MD   BP 128/66 mmHg  Pulse 94  Temp(Src) 98.8 F (37.1 C) (Oral)  Resp 23  SpO2 96% Physical Exam  Constitutional: She is oriented to person, place, and time. She appears well-developed and well-nourished.  HENT:  Head: Normocephalic and atraumatic.  Neck: Normal range of motion.  Cardiovascular: Normal rate and regular rhythm.   Pulmonary/Chest: Effort normal and breath sounds normal. No stridor. Tachypnea noted. No respiratory distress. She has no wheezes. She exhibits tenderness (right lateral).  Abdominal: Soft. She exhibits no distension.  There is no tenderness.  Musculoskeletal: She exhibits tenderness (right lateral ribs and back).  Neurological: She is alert and oriented to person, place, and time. No cranial nerve deficit. Coordination normal.  Skin: Skin is warm and dry. No rash noted.  Nursing note and vitals reviewed.   ED Course  Procedures (including critical care time) Labs Review Labs Reviewed - No data to display  Imaging Review Dg Chest 2 View  05/30/2015  CLINICAL DATA:  Fall today with right-sided anterior pleuritic chest pain and rib pain. EXAM: CHEST - 2 VIEW COMPARISON:  04/11/2015 FINDINGS: Mildly displaced acute fracture is identified of the lateral right sixth rib. There may also be a fracture of the adjacent fifth rib. No pneumothorax identified. No edema or pleural fluid. The heart size and mediastinal contours are normal. IMPRESSION: At least one acute fracture is identified of the right sixth rib. There may be an adjacent fifth rib fracture. No pneumothorax identified. Electronically Signed   By: Irish LackGlenn  Yamagata M.D.   On: 05/30/2015 22:51   I have personally reviewed and evaluated these images and lab results as part of my medical decision-making.   EKG Interpretation None      MDM   Final diagnoses:  Rib fracture, right, closed, initial encounter    Mechanical fall today and found to have a rib fx on xr. Was requiring multiple doses of medication to alleviate pain. When time for discharge, patient with low O2 sats, likely 2/2 splinting. However with her age, oxygen desaturation and COPD status, she likely would not do well at home. Will get CT to evaluate extent of fractures, ensure no other injuries, basic labs, and will likely need admit for pain control and monitoring.  Care to Dr. Blinda LeatherwoodPollina pending CT and labs.     Marily MemosJason Sahar Ryback, MD 05/31/15 1037

## 2015-05-30 NOTE — Telephone Encounter (Signed)
See Dr. Oliva Bustardohmeier's note from 4/5.  To Dr. Mayford Knifeurner.

## 2015-05-30 NOTE — ED Notes (Signed)
Pt c/o pain in ribs worse with inspiration.

## 2015-05-30 NOTE — ED Notes (Signed)
MD at bedside. 

## 2015-05-30 NOTE — ED Notes (Signed)
Per [facility], pt tripped & fell, uses a walker, c/o right side rib pain.  Hx:  COPD, HTN, NKDA, lives alone.  Received 150 mcg fentanyl.  Oxygen @ 2L/[place].  Pt denies LOC, hitting head, dizziness.

## 2015-05-30 NOTE — ED Notes (Signed)
[facility] Expected date:  Expected time:  Means of arrival:  Comments: EMS 82 yo female fall

## 2015-05-30 NOTE — ED Notes (Signed)
MD notified that pt was requesting pain medications.

## 2015-05-30 NOTE — Progress Notes (Signed)
Please refer patient to Dr. Excell Seltzerooper for evaluation for possible TAVR

## 2015-05-31 ENCOUNTER — Emergency Department (HOSPITAL_COMMUNITY): Payer: Commercial Managed Care - HMO

## 2015-05-31 ENCOUNTER — Encounter (HOSPITAL_COMMUNITY): Payer: Self-pay | Admitting: Internal Medicine

## 2015-05-31 DIAGNOSIS — W19XXXA Unspecified fall, initial encounter: Secondary | ICD-10-CM | POA: Diagnosis present

## 2015-05-31 DIAGNOSIS — F329 Major depressive disorder, single episode, unspecified: Secondary | ICD-10-CM

## 2015-05-31 DIAGNOSIS — J441 Chronic obstructive pulmonary disease with (acute) exacerbation: Secondary | ICD-10-CM | POA: Diagnosis not present

## 2015-05-31 DIAGNOSIS — F1010 Alcohol abuse, uncomplicated: Secondary | ICD-10-CM | POA: Diagnosis present

## 2015-05-31 DIAGNOSIS — Z720 Tobacco use: Secondary | ICD-10-CM | POA: Diagnosis not present

## 2015-05-31 DIAGNOSIS — S2239XA Fracture of one rib, unspecified side, initial encounter for closed fracture: Secondary | ICD-10-CM | POA: Diagnosis present

## 2015-05-31 DIAGNOSIS — S2231XA Fracture of one rib, right side, initial encounter for closed fracture: Secondary | ICD-10-CM | POA: Diagnosis not present

## 2015-05-31 LAB — BLOOD GAS, ARTERIAL
Acid-base deficit: 1.3 mmol/L (ref 0.0–2.0)
Bicarbonate: 23 mEq/L (ref 20.0–24.0)
DRAWN BY: 270521
FIO2: 0.21
O2 Saturation: 94 %
PH ART: 7.382 (ref 7.350–7.450)
Patient temperature: 98.6
TCO2: 21.1 mmol/L (ref 0–100)
pCO2 arterial: 39.6 mmHg (ref 35.0–45.0)
pO2, Arterial: 73.3 mmHg — ABNORMAL LOW (ref 80.0–100.0)

## 2015-05-31 LAB — CBC WITH DIFFERENTIAL/PLATELET
Basophils Absolute: 0 10*3/uL (ref 0.0–0.1)
Basophils Relative: 0 %
EOS ABS: 0.1 10*3/uL (ref 0.0–0.7)
EOS PCT: 1 %
HCT: 38.1 % (ref 36.0–46.0)
HEMOGLOBIN: 12.5 g/dL (ref 12.0–15.0)
LYMPHS ABS: 1 10*3/uL (ref 0.7–4.0)
LYMPHS PCT: 10 %
MCH: 30.8 pg (ref 26.0–34.0)
MCHC: 32.8 g/dL (ref 30.0–36.0)
MCV: 93.8 fL (ref 78.0–100.0)
MONOS PCT: 9 %
Monocytes Absolute: 0.9 10*3/uL (ref 0.1–1.0)
Neutro Abs: 8.3 10*3/uL — ABNORMAL HIGH (ref 1.7–7.7)
Neutrophils Relative %: 80 %
PLATELETS: 164 10*3/uL (ref 150–400)
RBC: 4.06 MIL/uL (ref 3.87–5.11)
RDW: 13.6 % (ref 11.5–15.5)
WBC: 10.3 10*3/uL (ref 4.0–10.5)

## 2015-05-31 LAB — COMPREHENSIVE METABOLIC PANEL
ALK PHOS: 79 U/L (ref 38–126)
ALT: 23 U/L (ref 14–54)
ANION GAP: 12 (ref 5–15)
AST: 23 U/L (ref 15–41)
Albumin: 4.1 g/dL (ref 3.5–5.0)
BUN: 16 mg/dL (ref 6–20)
CALCIUM: 9.2 mg/dL (ref 8.9–10.3)
CO2: 22 mmol/L (ref 22–32)
CREATININE: 0.53 mg/dL (ref 0.44–1.00)
Chloride: 108 mmol/L (ref 101–111)
Glucose, Bld: 113 mg/dL — ABNORMAL HIGH (ref 65–99)
Potassium: 4 mmol/L (ref 3.5–5.1)
SODIUM: 142 mmol/L (ref 135–145)
Total Bilirubin: 0.2 mg/dL — ABNORMAL LOW (ref 0.3–1.2)
Total Protein: 7.3 g/dL (ref 6.5–8.1)

## 2015-05-31 LAB — PROTIME-INR
INR: 1.02 (ref 0.00–1.49)
PROTHROMBIN TIME: 13.2 s (ref 11.6–15.2)

## 2015-05-31 LAB — TYPE AND SCREEN
ABO/RH(D): A POS
Antibody Screen: NEGATIVE

## 2015-05-31 LAB — INFLUENZA PANEL BY PCR (TYPE A & B)
H1N1FLUPCR: NOT DETECTED
INFLBPCR: NEGATIVE
Influenza A By PCR: NEGATIVE

## 2015-05-31 LAB — CBC
HCT: 36.7 % (ref 36.0–46.0)
Hemoglobin: 12.3 g/dL (ref 12.0–15.0)
MCH: 31.5 pg (ref 26.0–34.0)
MCHC: 33.5 g/dL (ref 30.0–36.0)
MCV: 93.9 fL (ref 78.0–100.0)
PLATELETS: 162 10*3/uL (ref 150–400)
RBC: 3.91 MIL/uL (ref 3.87–5.11)
RDW: 13.6 % (ref 11.5–15.5)
WBC: 8.4 10*3/uL (ref 4.0–10.5)

## 2015-05-31 LAB — BASIC METABOLIC PANEL
Anion gap: 9 (ref 5–15)
BUN: 16 mg/dL (ref 6–20)
CALCIUM: 9.2 mg/dL (ref 8.9–10.3)
CO2: 24 mmol/L (ref 22–32)
CREATININE: 0.58 mg/dL (ref 0.44–1.00)
Chloride: 107 mmol/L (ref 101–111)
GFR calc Af Amer: >60 mL/min (ref >60)
Glucose, Bld: 119 mg/dL — ABNORMAL HIGH (ref 65–99)
Potassium: 4.4 mmol/L (ref 3.5–5.1)
SODIUM: 140 mmol/L (ref 135–145)

## 2015-05-31 LAB — GLUCOSE, CAPILLARY: GLUCOSE-CAPILLARY: 133 mg/dL — AB (ref 65–99)

## 2015-05-31 LAB — APTT: APTT: 29 s (ref 24–37)

## 2015-05-31 MED ORDER — ONDANSETRON HCL 4 MG/2ML IJ SOLN
4.0000 mg | Freq: Four times a day (QID) | INTRAMUSCULAR | Status: DC | PRN
  Administered 2015-05-31: 4 mg via INTRAVENOUS
  Filled 2015-05-31: qty 2

## 2015-05-31 MED ORDER — IPRATROPIUM-ALBUTEROL 0.5-2.5 (3) MG/3ML IN SOLN
3.0000 mL | RESPIRATORY_TRACT | Status: DC
  Administered 2015-05-31 (×4): 3 mL via RESPIRATORY_TRACT
  Filled 2015-05-31 (×4): qty 3

## 2015-05-31 MED ORDER — ROSUVASTATIN CALCIUM 10 MG PO TABS
10.0000 mg | ORAL_TABLET | Freq: Every day | ORAL | Status: DC
  Administered 2015-05-31 – 2015-06-04 (×4): 10 mg via ORAL
  Filled 2015-05-31 (×4): qty 1

## 2015-05-31 MED ORDER — NICOTINE 21 MG/24HR TD PT24
21.0000 mg | MEDICATED_PATCH | Freq: Every day | TRANSDERMAL | Status: DC
  Administered 2015-05-31 – 2015-06-04 (×5): 21 mg via TRANSDERMAL
  Filled 2015-05-31 (×5): qty 1

## 2015-05-31 MED ORDER — VITAMIN B-1 100 MG PO TABS
100.0000 mg | ORAL_TABLET | Freq: Every day | ORAL | Status: DC
  Administered 2015-06-01 – 2015-06-04 (×4): 100 mg via ORAL
  Filled 2015-05-31 (×4): qty 1

## 2015-05-31 MED ORDER — SODIUM CHLORIDE 0.9 % IV SOLN
INTRAVENOUS | Status: DC
  Administered 2015-05-31 – 2015-06-01 (×3): via INTRAVENOUS

## 2015-05-31 MED ORDER — HYDROXYZINE HCL 25 MG PO TABS
25.0000 mg | ORAL_TABLET | Freq: Four times a day (QID) | ORAL | Status: DC | PRN
Start: 2015-05-31 — End: 2015-06-01

## 2015-05-31 MED ORDER — ACETAMINOPHEN 325 MG PO TABS: 650.0000 mg | ORAL_TABLET | Freq: Four times a day (QID) | ORAL | Status: DC | PRN

## 2015-05-31 MED ORDER — HYDROCODONE-ACETAMINOPHEN 5-325 MG PO TABS
1.0000 | ORAL_TABLET | ORAL | Status: DC | PRN
  Administered 2015-05-31 – 2015-06-01 (×5): 1 via ORAL
  Filled 2015-05-31 (×5): qty 1

## 2015-05-31 MED ORDER — LORAZEPAM 1 MG PO TABS
1.0000 mg | ORAL_TABLET | Freq: Four times a day (QID) | ORAL | Status: AC | PRN
  Administered 2015-06-02: 1 mg via ORAL
  Filled 2015-05-31: qty 1

## 2015-05-31 MED ORDER — ONDANSETRON HCL 4 MG PO TABS: 4.0000 mg | ORAL_TABLET | Freq: Four times a day (QID) | ORAL | Status: DC | PRN

## 2015-05-31 MED ORDER — SODIUM CHLORIDE 0.9% FLUSH
3.0000 mL | Freq: Two times a day (BID) | INTRAVENOUS | Status: DC
  Administered 2015-05-31 – 2015-06-04 (×7): 3 mL via INTRAVENOUS

## 2015-05-31 MED ORDER — LORAZEPAM 2 MG/ML IJ SOLN
1.0000 mg | Freq: Four times a day (QID) | INTRAMUSCULAR | Status: AC | PRN
  Administered 2015-06-02: 1 mg via INTRAVENOUS
  Filled 2015-05-31 (×3): qty 1

## 2015-05-31 MED ORDER — FENTANYL CITRATE (PF) 100 MCG/2ML IJ SOLN
50.0000 ug | Freq: Once | INTRAMUSCULAR | Status: AC
  Administered 2015-05-31: 50 ug via INTRAVENOUS
  Filled 2015-05-31: qty 2

## 2015-05-31 MED ORDER — LORAZEPAM 2 MG/ML IJ SOLN
0.0000 mg | Freq: Four times a day (QID) | INTRAMUSCULAR | Status: AC
  Administered 2015-05-31 – 2015-06-01 (×7): 2 mg via INTRAVENOUS
  Filled 2015-05-31 (×7): qty 1

## 2015-05-31 MED ORDER — ACETAMINOPHEN 650 MG RE SUPP: 650.0000 mg | Freq: Four times a day (QID) | RECTAL | Status: DC | PRN

## 2015-05-31 MED ORDER — TIOTROPIUM BROMIDE MONOHYDRATE 18 MCG IN CAPS
18.0000 ug | ORAL_CAPSULE | Freq: Every day | RESPIRATORY_TRACT | Status: DC
  Filled 2015-05-31: qty 5

## 2015-05-31 MED ORDER — FOLIC ACID 1 MG PO TABS
1.0000 mg | ORAL_TABLET | Freq: Every day | ORAL | Status: DC
  Administered 2015-05-31 – 2015-06-04 (×5): 1 mg via ORAL
  Filled 2015-05-31 (×5): qty 1

## 2015-05-31 MED ORDER — LORAZEPAM 2 MG/ML IJ SOLN
0.0000 mg | Freq: Two times a day (BID) | INTRAMUSCULAR | Status: DC
  Administered 2015-06-02: 2 mg via INTRAVENOUS
  Administered 2015-06-02: 1 mg via INTRAVENOUS
  Administered 2015-06-03: 2 mg via INTRAVENOUS
  Filled 2015-05-31 (×3): qty 1

## 2015-05-31 MED ORDER — HYDRALAZINE HCL 20 MG/ML IJ SOLN: 5.0000 mg | INTRAMUSCULAR | Status: DC | PRN

## 2015-05-31 MED ORDER — IPRATROPIUM-ALBUTEROL 0.5-2.5 (3) MG/3ML IN SOLN
3.0000 mL | Freq: Three times a day (TID) | RESPIRATORY_TRACT | Status: DC
  Administered 2015-06-01 – 2015-06-05 (×12): 3 mL via RESPIRATORY_TRACT
  Filled 2015-05-31 (×13): qty 3

## 2015-05-31 MED ORDER — LOSARTAN POTASSIUM 50 MG PO TABS: 50.0000 mg | ORAL_TABLET | Freq: Every day | ORAL | Status: DC

## 2015-05-31 MED ORDER — THIAMINE HCL 100 MG/ML IJ SOLN
100.0000 mg | Freq: Every day | INTRAMUSCULAR | Status: DC
  Administered 2015-05-31: 100 mg via INTRAVENOUS
  Filled 2015-05-31: qty 2

## 2015-05-31 MED ORDER — AZITHROMYCIN 250 MG PO TABS
250.0000 mg | ORAL_TABLET | Freq: Every day | ORAL | Status: AC
  Administered 2015-06-01 – 2015-06-04 (×4): 250 mg via ORAL
  Filled 2015-05-31 (×4): qty 1

## 2015-05-31 MED ORDER — ALBUTEROL SULFATE (2.5 MG/3ML) 0.083% IN NEBU
2.5000 mg | INHALATION_SOLUTION | RESPIRATORY_TRACT | Status: DC | PRN
  Administered 2015-05-31: 2.5 mg via RESPIRATORY_TRACT
  Filled 2015-05-31: qty 3

## 2015-05-31 MED ORDER — METHYLPREDNISOLONE SODIUM SUCC 125 MG IJ SOLR
60.0000 mg | Freq: Two times a day (BID) | INTRAMUSCULAR | Status: DC
  Administered 2015-05-31 – 2015-06-01 (×3): 60 mg via INTRAVENOUS
  Filled 2015-05-31 (×3): qty 2

## 2015-05-31 MED ORDER — DM-GUAIFENESIN ER 30-600 MG PO TB12
1.0000 | ORAL_TABLET | Freq: Two times a day (BID) | ORAL | Status: DC | PRN
Start: 2015-05-31 — End: 2015-06-03

## 2015-05-31 MED ORDER — AZITHROMYCIN 250 MG PO TABS
500.0000 mg | ORAL_TABLET | Freq: Every day | ORAL | Status: AC
  Administered 2015-05-31: 500 mg via ORAL
  Filled 2015-05-31: qty 2

## 2015-05-31 MED ORDER — ADULT MULTIVITAMIN W/MINERALS CH
1.0000 | ORAL_TABLET | Freq: Every day | ORAL | Status: DC
  Administered 2015-05-31 – 2015-06-04 (×5): 1 via ORAL
  Filled 2015-05-31 (×5): qty 1

## 2015-05-31 NOTE — Progress Notes (Signed)
Patient Demographics  Karina Andrews, is a 82 y.o. female, DOB - Jul 01, 1930, ZOX:[id number]  Admit date - 05/30/2015   Admitting Physician Lorretta Harp, MD  Outpatient Primary MD for the patient is Oliver Barre, MD  LOS -    Chief Complaint  Patient presents with  . Fall    right rib pain       Patient was admitted earlier today by my colleague, charts, labs and imaging were reviewed, 82 y.o. female with PMH of alcohol abuse, tobacco abuse, hypertension, hyperlipidemia, COPD, depression, aortic stenosis, osteoporosis, who presents with right rib cage pain after fall, productive cough and shortness of breath. Admitted for COPD cerebration treatment, and pain management for rib fracture.   Subjective:   Karina Andrews today Sleepy, and can't provide any complaints.  Assessment & Plan    Principal Problem:   Closed rib fracture Active Problems:   Hyperlipidemia   NICOTINE ADDICTION   Depression   Essential hypertension   DEGENERATIVE DISC DISEASE, CERVICAL SPINE, WITH MYELOPATHY   Senile osteoporosis   Aortic stenosis, severe   Cigarette smoker   COPD exacerbation (HCC)   Fall   Alcohol abuse  Closed rib fracture and minimally displaced fracture of the right scapular tip:  - as evidenced by CT-chest. No pneumothorax. Pt has moderate pain.  - Continue with pain management, patient is lethargic this a.m., this is most likely due to IV fentanyl she received earlier during the day in ED, continue with Vicodin for now for pain. - continue with pulmonary toilet, incentive spirometry. - Repeat CXR in a.m..  COPD exacerbation:  - pt has productive cough, shortness of breath and bilateral wheezing on auscultation, consistent with COPD exacerbation. No symptoms of flu - Continue with IV Solu-Medrol. -Nebulizers: scheduled Duoneb and prn albuterol -continue home spiriva inhaler -Oral azithromycin  for 5 days.  -Mucinex for cough    HLD: Last LDL was not on record -Continue home medications: crestor -Check FLP  HTN:  -Hold Cozaar due to AKI -IV hydralazine when necessary  Tobacco abuse and Alcohol abuse: -Nicotine patch -CIWA protocol   Code Status: Full  Family Communication: nnoe at bedside  Disposition Plan: pending PT   Procedures  none   Consults   none   Medications  Scheduled Meds: . [START ON 06/01/2015] azithromycin  250 mg Oral Daily  . folic acid  1 mg Oral Daily  . ipratropium-albuterol  3 mL Nebulization Q4H  . LORazepam  0-4 mg Intravenous Q6H   Followed by  . [START ON 06/02/2015] LORazepam  0-4 mg Intravenous Q12H  . methylPREDNISolone (SOLU-MEDROL) injection  60 mg Intravenous Q12H  . multivitamin with minerals  1 tablet Oral Daily  . nicotine  21 mg Transdermal Daily  . rosuvastatin  10 mg Oral Daily  . sodium chloride flush  3 mL Intravenous Q12H  . thiamine  100 mg Oral Daily   Or  . thiamine  100 mg Intravenous Daily  . tiotropium  18 mcg Inhalation Daily   Continuous Infusions: . sodium chloride 100 mL/hr at 05/31/15 0522   PRN Meds:.acetaminophen **OR** acetaminophen, albuterol, dextromethorphan-guaiFENesin, hydrALAZINE, HYDROcodone-acetaminophen, hydrOXYzine, LORazepam **OR** LORazepam, ondansetron **OR** ondansetron (ZOFRAN) IV  DVT Prophylaxis  SCDs   Lab Results  Component  Value Date   PLT 162 05/31/2015    Antibiotics    Anti-infectives    Start     Dose/Rate Route Frequency Ordered Stop   06/01/15 1000  azithromycin (ZITHROMAX) tablet 250 mg     250 mg Oral Daily 05/31/15 0441 06/05/15 0959   05/31/15 1000  azithromycin (ZITHROMAX) tablet 500 mg     500 mg Oral Daily 05/31/15 0441 05/31/15 1057          Objective:   Filed Vitals:   05/31/15 0535 05/31/15 0536 05/31/15 0601 05/31/15 1154  BP: 155/79 155/79 154/64   Pulse: 94 94 98   Temp:   97.5 F (36.4 C)   TempSrc:      Resp: 22  21   Height:     (1.626 m)   Weight:   61.689 kg (136 lb)   SpO2: 99%  97% 97%    Wt Readings from Last 3 Encounters:  05/31/15 61.689 kg (136 lb)  05/30/15 61.689 kg (136 lb)  05/23/15 61.236 kg (135 lb)    No intake or output data in the 24 hours ending 05/31/15 1221   Physical Exam  Patient sleepy, but arousable to loud verbal stimuli. Supple Neck,No JVD,  Symmetrical Chest wall movement, Good air movement bilaterally, scattered wheezing RRR,No Gallops,Rubs or new Murmurs, No Parasternal Heave +ve B.Sounds, Abd Soft, No tenderness, No organomegaly appriciated, No rebound - guarding or rigidity. No Cyanosis, Clubbing or edema, No new Rash or bruise     Data Review   Micro Results No results found for this or any previous visit (from the past 240 hour(s)).  Radiology Reports Dg Chest 2 View  05/30/2015  CLINICAL DATA:  Fall today with right-sided anterior pleuritic chest pain and rib pain. EXAM: CHEST - 2 VIEW COMPARISON:  04/11/2015 FINDINGS: Mildly displaced acute fracture is identified of the lateral right sixth rib. There may also be a fracture of the adjacent fifth rib. No pneumothorax identified. No edema or pleural fluid. The heart size and mediastinal contours are normal. IMPRESSION: At least one acute fracture is identified of the right sixth rib. There may be an adjacent fifth rib fracture. No pneumothorax identified. Electronically Signed   By: Irish Lack M.D.   On: 05/30/2015 22:51   Ct Chest Wo Contrast  05/31/2015  CLINICAL DATA:  Fall onto right-sided tonight with rib fracture on radiograph. EXAM: CT CHEST WITHOUT CONTRAST TECHNIQUE: Multidetector CT imaging of the chest was performed following the standard protocol without IV contrast. COMPARISON:  Chest radiograph yesterday.  Chest CT 10/18/2015 FINDINGS: Patient breathing motion artifact partially limits assessment. Minimally displaced fractures of anterior right fourth and lateral sixth ribs, with nondisplaced fracture  of the anterior lateral right fifth and seventh ribs. No pneumothorax. Minimally displaced fracture of the right scapular tip. Cortical irregularity of the sternum with felt to be secondary to patient motion. Stable degenerative change in the thoracic spine without acute fracture. Stable Schmorl's node superior endplate of L1. Emphysema. Dependent right lung base opacity, likely atelectasis, less likely pulmonary contusion, no subjacent fracture of the lower ribs. Right upper lobe nodule image 34 series 2 measures 10 mm, unchanged. Nodule in the upper lobe measures 1.2 cm image 28 series 2, unchanged. Minimal right-sided pleural thickening without frank pleural effusion. Heart at the upper limits normal in size. Mitral annulus calcifications. Atherosclerotic calcifications of the thoracic aorta without periaortic soft tissue stranding or fluid. Calcified hilar and mediastinal lymph nodes. No evidence of adenopathy. No  acute abnormality in the included upper abdomen. IMPRESSION: 1. Right fourth through seventh anterior lateral rib fractures, fourth and sixth fractures are minimally displaced. No pneumothorax. 2. Minimally displaced fracture of the right scapular tip. 3. Dependent right lower lobe opacity is favored to be atelectasis over pulmonary contusion. 4. Right lung nodules are unchanged from exam 6 weeks prior. Continued CT follow-up is recommended to evaluate for stability. Electronically Signed   By: Rubye OaksMelanie  Ehinger M.D.   On: 05/31/2015 02:29   Mr Laqueta JeanBrain W ZOWo Contrast  05/23/2015  CLINICAL DATA:  Dysarthria. EXAM: MRI HEAD WITHOUT AND WITH CONTRAST TECHNIQUE: Multiplanar, multiecho pulse sequences of the brain and surrounding structures were obtained without and with intravenous contrast. CONTRAST:  12mL MULTIHANCE GADOBENATE DIMEGLUMINE 529 MG/ML IV SOLN COMPARISON:  CT 01/02/2009 FINDINGS: Diffusion imaging does not show any acute or subacute infarction. The brain shows generalized atrophy. There  chronic small-vessel ischemic changes of the pons. No focal cerebellar insult. The cerebral hemispheres show confluent chronic small vessel ischemic changes throughout the deep and subcortical white matter. Basal ganglia and thalami also show chronic small-vessel ischemic changes. No cortical or large vessel territory infarction. No mass lesion, hemorrhage, hydrocephalus or extra-axial collection. After contrast administration, no abnormal enhancement occurs. IMPRESSION: Atrophy and chronic small vessel ischemic changes throughout the brain. No acute or reversible finding. Electronically Signed   By: Paulina FusiMark  Shogry M.D.   On: 05/23/2015 18:32     CBC  Recent Labs Lab 05/31/15 0046 05/31/15 0447  WBC 10.3 8.4  HGB 12.5 12.3  HCT 38.1 36.7  PLT 164 162  MCV 93.8 93.9  MCH 30.8 31.5  MCHC 32.8 33.5  RDW 13.6 13.6  LYMPHSABS 1.0  --   MONOABS 0.9  --   EOSABS 0.1  --   BASOSABS 0.0  --     Chemistries   Recent Labs Lab 05/31/15 0046 05/31/15 0447  NA 142 140  K 4.0 4.4  CL 108 107  CO2 22 24  GLUCOSE 113* 119*  BUN 16 16  CREATININE 0.53 0.58  CALCIUM 9.2 9.2  AST 23  --   ALT 23  --   ALKPHOS 79  --   BILITOT 0.2*  --    ------------------------------------------------------------------------------------------------------------------ estimated creatinine clearance is 46 mL/min (by C-G formula based on Cr of 0.58). ------------------------------------------------------------------------------------------------------------------ No results for input(s): HGBA1C in the last 72 hours. ------------------------------------------------------------------------------------------------------------------ No results for input(s): CHOL, HDL, LDLCALC, TRIG, CHOLHDL, LDLDIRECT in the last 72 hours. ------------------------------------------------------------------------------------------------------------------ No results for input(s): TSH, T4TOTAL, T3FREE, THYROIDAB in the last 72  hours.  Invalid input(s): FREET3 ------------------------------------------------------------------------------------------------------------------ No results for input(s): VITAMINB12, FOLATE, FERRITIN, TIBC, IRON, RETICCTPCT in the last 72 hours.  Coagulation profile  Recent Labs Lab 05/31/15 0447  INR 1.02    No results for input(s): DDIMER in the last 72 hours.  Cardiac Enzymes No results for input(s): CKMB, TROPONINI, MYOGLOBIN in the last 168 hours.  Invalid input(s): CK ------------------------------------------------------------------------------------------------------------------ Invalid input(s): POCBNP     Time Spent in minutes   No charge   Randol KernELGERGAWY, Christpoher Sievers M.D on 05/31/2015 at 12:21 PM  Between 7am to 7pm - Pager - [phone]  After 7pm go to [email or web address] - password [email or web address]  [facility]   Office  [phone]

## 2015-05-31 NOTE — Care Management Obs Status (Addendum)
MEDICARE OBSERVATION STATUS NOTIFICATION   Patient Details  Name: Karina Andrews MRN: [id number] Date of Birth: 10/01/1930   Medicare Observation Status Notification Given:  Yes, pt in isolation room.  Copy left at bedside.     Geni BersMcGibboney, [place]ome Bublitz, RN 05/31/2015, 3:36 PM

## 2015-05-31 NOTE — ED Notes (Signed)
Dr.Messner notified that pt oxygen saturation decreased to 88% when attempting to sit on side of bed. Pt placed back on 2L O2 via [place].

## 2015-05-31 NOTE — ED Notes (Signed)
Dr.Messner took pt off of oxygen to evaluate SPO2 and wants pt to attempt to ambulate to assess Oxygenation.

## 2015-05-31 NOTE — Evaluation (Signed)
Physical Therapy Evaluation Patient Details Name: Karina Andrews Marvin MRN: [id number] DOB: [birth year] Today's Date: 05/31/2015   History of Present Illness  82 yo female admitted with R 4th-7th rib fractures and R scapular tip fx after having a fall. Hx of ETOH, COPD, aortic stenosis.   Clinical Impression  On eval, pt required Mod assist +2 to stand and pivot from bed to recliner with a RW. Total assist +2 for bed mobility to minimize pain from rib fractures. Recommend ST rehab at SNF    Follow Up Recommendations SNF    Equipment Recommendations  None recommended by PT    Recommendations for Other Services OT consult     Precautions / Restrictions Precautions Precautions: Fall Precaution Comments: rib fxs Restrictions Weight Bearing Restrictions: No      Mobility  Bed Mobility Overal bed mobility: Needs Assistance Bed Mobility: Supine to Sit     Supine to sit: Total assist;+2 for physical assistance;+2 for safety/equipment     General bed mobility comments: utilized pad.  Therapists did work to decrease pain; HOB raised  Transfers Overall transfer level: Needs assistance Equipment used: Rolling walker (2 wheeled) Transfers: Sit to/from Stand Sit to Stand: Mod assist;+2 physical assistance Stand pivot transfers: Mod assist;+2 physical assistance       General transfer comment: assist to rise, stabilize, maneuver walker. Pt took very small steps. Increased time to complete task. Stand pivot, bed to recliner with RW. Remained on [place] O2.  Ambulation/Gait             General Gait Details: NT  Stairs            Wheelchair Mobility    Modified Rankin (Stroke Patients Only)       Balance Overall balance assessment: Needs assistance         Standing balance support: Bilateral upper extremity supported;During functional activity Standing balance-Leahy Scale: Poor                               Pertinent Vitals/Pain Pain Assessment:  Faces Faces Pain Scale: Hurts whole lot Pain Location: R ribs with certain movements and coughing Pain Descriptors / Indicators: Grimacing;Guarding;Moaning Pain Intervention(s): Limited activity within patient's tolerance;Repositioned    Home Living Family/patient expects to be discharged to:: Unsure Living Arrangements: Alone   Type of Home: House Home Access: Level entry     Home Layout: One level Home Equipment: Environmental consultantWalker - 2 wheels;Shower seat Additional Comments: pt lives alone in a house with no stairs to enter. She has a walk in shower and standard commode    Prior Function Level of Independence: Independent               Hand Dominance        Extremity/Trunk Assessment   Upper Extremity Assessment: Defer to OT evaluation RUE Deficits / Details: pt performed AAROM to RUE to 80 degrees; initiated movement to 30--may be due to rib fxs.  distal movement wfls         Lower Extremity Assessment: Generalized weakness      Cervical / Trunk Assessment: Normal  Communication   Communication:  (difficult to understand at times)  Cognition Arousal/Alertness: Awake/alert Behavior During Therapy: WFL for tasks assessed/performed Overall Cognitive Status: Within Functional Limits for tasks assessed                      General Comments  Exercises        Assessment/Plan    PT Assessment Patient needs continued PT services  PT Diagnosis Difficulty walking;Generalized weakness;Acute pain   PT Problem List Decreased strength;Decreased range of motion;Decreased activity tolerance;Decreased balance;Decreased mobility;Decreased knowledge of use of DME;Pain  PT Treatment Interventions DME instruction;Gait training;Functional mobility training;Therapeutic activities;Patient/family education;Balance training;Therapeutic exercise   PT Goals (Current goals can be found in the Care Plan section) Acute Rehab PT Goals Patient Stated Goal: none stated; agreeble  to therapy and OOB PT Goal Formulation: With patient Time For Goal Achievement: 06/14/15 Potential to Achieve Goals: Good    Frequency Min 3X/week   Barriers to discharge        Co-evaluation               End of Session   Activity Tolerance: Patient limited by pain Patient left: in chair;with call bell/phone within reach;with chair alarm set      Functional Assessment Tool Used: clinical judgement Functional Limitation: Mobility: Walking and moving around Mobility: Walking and Moving Around Current Status (Z6109): At least 60 percent but less than 80 percent impaired, limited or restricted Mobility: Walking and Moving Around Goal Status [phone]): At least 20 percent but less than 40 percent impaired, limited or restricted    Time: 1342-1406 PT Time Calculation (min) (ACUTE ONLY): 24 min   Charges:   PT Evaluation $PT Eval Low Complexity: 1 Procedure     PT G Codes:   PT G-Codes **NOT FOR INPATIENT CLASS** Functional Assessment Tool Used: clinical judgement Functional Limitation: Mobility: Walking and moving around Mobility: Walking and Moving Around Current Status (U9811): At least 60 percent but less than 80 percent impaired, limited or restricted Mobility: Walking and Moving Around Goal Status [phone]): At least 20 percent but less than 40 percent impaired, limited or restricted    Rebeca Alert, MPT Pager: [phone]

## 2015-05-31 NOTE — ED Provider Notes (Signed)
Patient signed out to me to follow-up on imaging. Patient was seen after a ground-level fall. X-ray revealed evidence of rib fracture. Patient exhibiting low oxygen saturation here in the ER associated with the fractures. Additional imaging and lab work was ordered. These have returned and do show fracture of the right fourth through seventh rib and the tip of the scapula. No pulmonary contusion or pneumothorax. Patient requiring frequent doses of narcotics for analgesia. She is also requiring oxygen supplementation. Will require hospitalization for further management.  Results for orders placed or performed during the hospital encounter of 05/30/15  CBC with Differential  Result Value Ref Range   WBC 10.3 4.0 - 10.5 K/uL   RBC 4.06 3.87 - 5.11 MIL/uL   Hemoglobin 12.5 12.0 - 15.0 g/dL   HCT 84.638.1 96.236.0 - 95.246.0 %   MCV 93.8 78.0 - 100.0 fL   MCH 30.8 26.0 - 34.0 pg   MCHC 32.8 30.0 - 36.0 g/dL   RDW 84.113.6 32.411.5 - 40.115.5 %   Platelets 164 150 - 400 K/uL   Neutrophils Relative % 80 %   Neutro Abs 8.3 (H) 1.7 - 7.7 K/uL   Lymphocytes Relative 10 %   Lymphs Abs 1.0 0.7 - 4.0 K/uL   Monocytes Relative 9 %   Monocytes Absolute 0.9 0.1 - 1.0 K/uL   Eosinophils Relative 1 %   Eosinophils Absolute 0.1 0.0 - 0.7 K/uL   Basophils Relative 0 %   Basophils Absolute 0.0 0.0 - 0.1 K/uL  Comprehensive metabolic panel  Result Value Ref Range   Sodium 142 135 - 145 mmol/L   Potassium 4.0 3.5 - 5.1 mmol/L   Chloride 108 101 - 111 mmol/L   CO2 22 22 - 32 mmol/L   Glucose, Bld 113 (H) 65 - 99 mg/dL   BUN 16 6 - 20 mg/dL   Creatinine, Ser 0.270.53 0.44 - 1.00 mg/dL   Calcium 9.2 8.9 - 25.310.3 mg/dL   Total Protein 7.3 6.5 - 8.1 g/dL   Albumin 4.1 3.5 - 5.0 g/dL   AST 23 15 - 41 U/L   ALT 23 14 - 54 U/L   Alkaline Phosphatase 79 38 - 126 U/L   Total Bilirubin 0.2 (L) 0.3 - 1.2 mg/dL   GFR calc non Af Amer >60 >60 mL/min   GFR calc Af Amer >60 >60 mL/min   Anion gap 12 5 - 15   Dg Chest 2 View  05/30/2015   CLINICAL DATA:  Fall today with right-sided anterior pleuritic chest pain and rib pain. EXAM: CHEST - 2 VIEW COMPARISON:  04/11/2015 FINDINGS: Mildly displaced acute fracture is identified of the lateral right sixth rib. There may also be a fracture of the adjacent fifth rib. No pneumothorax identified. No edema or pleural fluid. The heart size and mediastinal contours are normal. IMPRESSION: At least one acute fracture is identified of the right sixth rib. There may be an adjacent fifth rib fracture. No pneumothorax identified. Electronically Signed   By: Irish LackGlenn  Yamagata M.D.   On: 05/30/2015 22:51   Ct Chest Wo Contrast  05/31/2015  CLINICAL DATA:  Fall onto right-sided tonight with rib fracture on radiograph. EXAM: CT CHEST WITHOUT CONTRAST TECHNIQUE: Multidetector CT imaging of the chest was performed following the standard protocol without IV contrast. COMPARISON:  Chest radiograph yesterday.  Chest CT 10/18/2015 FINDINGS: Patient breathing motion artifact partially limits assessment. Minimally displaced fractures of anterior right fourth and lateral sixth ribs, with nondisplaced fracture of the anterior lateral  right fifth and seventh ribs. No pneumothorax. Minimally displaced fracture of the right scapular tip. Cortical irregularity of the sternum with felt to be secondary to patient motion. Stable degenerative change in the thoracic spine without acute fracture. Stable Schmorl's node superior endplate of L1. Emphysema. Dependent right lung base opacity, likely atelectasis, less likely pulmonary contusion, no subjacent fracture of the lower ribs. Right upper lobe nodule image 34 series 2 measures 10 mm, unchanged. Nodule in the upper lobe measures 1.2 cm image 28 series 2, unchanged. Minimal right-sided pleural thickening without frank pleural effusion. Heart at the upper limits normal in size. Mitral annulus calcifications. Atherosclerotic calcifications of the thoracic aorta without periaortic soft tissue  stranding or fluid. Calcified hilar and mediastinal lymph nodes. No evidence of adenopathy. No acute abnormality in the included upper abdomen. IMPRESSION: 1. Right fourth through seventh anterior lateral rib fractures, fourth and sixth fractures are minimally displaced. No pneumothorax. 2. Minimally displaced fracture of the right scapular tip. 3. Dependent right lower lobe opacity is favored to be atelectasis over pulmonary contusion. 4. Right lung nodules are unchanged from exam 6 weeks prior. Continued CT follow-up is recommended to evaluate for stability. Electronically Signed   By: Rubye Oaks M.D.   On: 05/31/2015 02:29   Mr Laqueta Jean ZO Contrast  05/23/2015  CLINICAL DATA:  Dysarthria. EXAM: MRI HEAD WITHOUT AND WITH CONTRAST TECHNIQUE: Multiplanar, multiecho pulse sequences of the brain and surrounding structures were obtained without and with intravenous contrast. CONTRAST:  12mL MULTIHANCE GADOBENATE DIMEGLUMINE 529 MG/ML IV SOLN COMPARISON:  CT 01/02/2009 FINDINGS: Diffusion imaging does not show any acute or subacute infarction. The brain shows generalized atrophy. There chronic small-vessel ischemic changes of the pons. No focal cerebellar insult. The cerebral hemispheres show confluent chronic small vessel ischemic changes throughout the deep and subcortical white matter. Basal ganglia and thalami also show chronic small-vessel ischemic changes. No cortical or large vessel territory infarction. No mass lesion, hemorrhage, hydrocephalus or extra-axial collection. After contrast administration, no abnormal enhancement occurs. IMPRESSION: Atrophy and chronic small vessel ischemic changes throughout the brain. No acute or reversible finding. Electronically Signed   By: Paulina Fusi M.D.   On: 05/23/2015 18:32    Filed Vitals:   05/31/15 0000 05/31/15 0215  BP: 125/72 140/79  Pulse: 86 95  Temp:    Resp: 22 21     Gilda Crease, MD 05/31/15 [phone]

## 2015-05-31 NOTE — ED Notes (Signed)
Pt in CT at this time.

## 2015-05-31 NOTE — H&P (Addendum)
[facility] History and Physical  Karina Andrews ZOX:[id number] DOB: [birth year] DOA: 05/30/2015  Referring physician: ED physician PCP: Oliver Barre, MD  Specialists:   Chief Complaint: right rib cage pain after fall, productive cough and shortness of breath  HPI: Karina Andrews is a 82 y.o. female with PMH of alcohol abuse, tobacco abuse, hypertension, hyperlipidemia, COPD, depression, aortic stenosis, osteoporosis, who presents with right rib cage pain after fall, productive cough and shortness of breath.  Pt reports that she tripped & fell, injured her right rib cage today. She developed pain over right rib cage. Pt denies LOC, hitting head or dizziness. She has cough with yellow colored sputum production. She has shortness of breath, but no fever, chills or chest pain. No nausea, vomiting, abdominal pain or symptoms of UTI. No unilateral weakness.  In ED, patient was found to have WBC 10.3, temperature normal, oxygen desaturated to 88% on room air, slightly tachypnea, electrolytes and renal function okay. X-ray showed Fracture in right sixth rib. CT-chest showed right fourth through seventh anterior lateral rib fractures, fourth and sixth fractures are minimally displaced. No pneumothorax; minimally displaced fracture of the right scapular tip; dependent right lower lobe opacity is favored to be atelectasis over pulmonary contusion; right lung nodules are unchanged from exam 6 weeks prior.  EKG: Not done in ED, will get one.   Where does patient live?   At home  Can patient participate in ADLs? Barely  Review of Systems:   General: no fevers, chills, no changes in body weight, has fatigue HEENT: no blurry vision, hearing changes or sore throat Pulm: has dyspnea, coughing, wheezing CV: has right chest wall pain, no palpitations Abd: no nausea, vomiting, abdominal pain, diarrhea, constipation GU: no dysuria, burning on urination, increased urinary frequency, hematuria  Ext: no  leg edema Neuro: no unilateral weakness, numbness, or tingling, no vision change or hearing loss Skin: no rash MSK: No muscle spasm, no deformity, no limitation of range of movement in spin Heme: No easy bruising.  Travel history: No recent long distant travel.  Allergy: No Known Allergies  Past Medical History  Diagnosis Date  . Hypertension   . Depression   . Osteoporosis     (R) hip fx 11/2009 & (L) hip fx 2006  . COPD (chronic obstructive pulmonary disease) (HCC)   . Cervical spondylosis with myelopathy 01/2009    s/p decompression  . Aortic stenosis     moderate on echo 09/2013  . Alcohol abuse     Past Surgical History  Procedure Laterality Date  . Bladder tuck    . Cholecystectomy  1995  . Posterior laminectomy / decompression cervical spine  01/2009    Done by Dr. Danielle Dess  . Right hip  11/2009    ORIF/ Done by Dr. Turner Daniels  . Cataract extraction Right 06/06/13    Social History:  reports that she has been smoking Cigarettes.  She has a 26.5 pack-year smoking history. She has never used smokeless tobacco. She reports that she drinks alcohol. She reports that she does not use illicit drugs.  Family History:  Family History  Problem Relation Age of Onset  . Heart attack Father      Prior to Admission medications   Medication Sig Start Date End Date Taking? Authorizing Provider  albuterol (VENTOLIN HFA) 108 (90 Base) MCG/ACT inhaler Inhale 2 puffs into the lungs every 6 (six) hours as needed for wheezing or shortness of breath. 04/20/15  Yes Pincus Sanes, MD  budesonide-formoterol (SYMBICORT) 160-4.5 MCG/ACT inhaler Inhale 2 puffs into the lungs 2 (two) times daily. 03/11/14  Yes Newt Lukes, MD  losartan (COZAAR) 50 MG tablet Take 1 tablet (50 mg total) by mouth daily. 09/30/14  Yes Newt Lukes, MD  rosuvastatin (CRESTOR) 10 MG tablet Take 1 tablet (10 mg total) by mouth daily. 04/13/15  Yes Corwin Levins, MD  tiotropium (SPIRIVA) 18 MCG inhalation capsule  Place 18 mcg into inhaler and inhale daily.   Yes Historical Provider, MD  hydrOXYzine (ATARAX/VISTARIL) 50 MG tablet 1/2 - 1 tab by mouth three times per day as needed Patient taking differently: Take 25 mg by mouth every 6 (six) hours as needed for anxiety or itching.  04/30/15   Corwin Levins, MD  ibuprofen (ADVIL,MOTRIN) 400 MG tablet Take 1 tablet (400 mg total) by mouth 4 (four) times daily. 05/30/15   Marily Memos, MD  oxyCODONE-acetaminophen (PERCOCET/ROXICET) 5-325 MG tablet Take 1-2 tablets by mouth every 6 (six) hours as needed for severe pain. 05/30/15   Marily Memos, MD    Physical Exam: Filed Vitals:   05/30/15 2358 05/31/15 0000 05/31/15 0040 05/31/15 0215  BP: 156/83 125/72  140/79  Pulse: 91 86  95  Temp:      TempSrc:      Resp: SpO2: 94% 95% 88% 94%   General: Not in acute distress HEENT:       Eyes: PERRL, EOMI, no scleral icterus.       ENT: No discharge from the ears and nose, no pharynx injection, no tonsillar enlargement.        Neck: No JVD, no bruit, no mass felt. Heme: No neck lymph node enlargement. Cardiac: S1/S2, RRR, 3/6 systolic murmurs, No gallops or rubs. Pulm: has bilateral wheezing. No rales or rubs. Has tenderness over right rib cage. Abd: Soft, nondistended, nontender, no rebound pain, no organomegaly, BS present. Ext: No pitting leg edema bilaterally. 2+DP/PT pulse bilaterally. Musculoskeletal: No joint deformities, No joint redness or warmth, no limitation of ROM in spin. Skin: No rashes.  Neuro: Alert, oriented X3, cranial nerves II-XII grossly intact, moves all extremities normally. Psych: Patient is not psychotic, no suicidal or hemocidal ideation.  Labs on Admission:  Basic Metabolic Panel:  Recent Labs Lab 05/31/15 0046  NA 142  K 4.0  CL 108  CO2 22  GLUCOSE 113*  BUN 16  CREATININE 0.53  CALCIUM 9.2   Liver Function Tests:  Recent Labs Lab 05/31/15 0046  AST 23  ALT 23  ALKPHOS 79  BILITOT 0.2*  PROT 7.3   ALBUMIN 4.1   No results for input(s): LIPASE, AMYLASE in the last 168 hours. No results for input(s): AMMONIA in the last 168 hours. CBC:  Recent Labs Lab 05/31/15 0046  WBC 10.3  NEUTROABS 8.3*  HGB 12.5  HCT 38.1  MCV 93.8  PLT 164   Cardiac Enzymes: No results for input(s): CKTOTAL, CKMB, CKMBINDEX, TROPONINI in the last 168 hours.  BNP (last 3 results) No results for input(s): BNP in the last 8760 hours.  ProBNP (last 3 results)  Recent Labs  04/11/15 1731  PROBNP 33.0    CBG: No results for input(s): GLUCAP in the last 168 hours.  Radiological Exams on Admission: Dg Chest 2 View  05/30/2015  CLINICAL DATA:  Fall today with right-sided anterior pleuritic chest pain and rib pain. EXAM: CHEST - 2 VIEW COMPARISON:  04/11/2015 FINDINGS: Mildly displaced acute fracture is identified of  the lateral right sixth rib. There may also be a fracture of the adjacent fifth rib. No pneumothorax identified. No edema or pleural fluid. The heart size and mediastinal contours are normal. IMPRESSION: At least one acute fracture is identified of the right sixth rib. There may be an adjacent fifth rib fracture. No pneumothorax identified. Electronically Signed   By: Irish Lack M.D.   On: 05/30/2015 22:51   Ct Chest Wo Contrast  05/31/2015  CLINICAL DATA:  Fall onto right-sided tonight with rib fracture on radiograph. EXAM: CT CHEST WITHOUT CONTRAST TECHNIQUE: Multidetector CT imaging of the chest was performed following the standard protocol without IV contrast. COMPARISON:  Chest radiograph yesterday.  Chest CT 10/18/2015 FINDINGS: Patient breathing motion artifact partially limits assessment. Minimally displaced fractures of anterior right fourth and lateral sixth ribs, with nondisplaced fracture of the anterior lateral right fifth and seventh ribs. No pneumothorax. Minimally displaced fracture of the right scapular tip. Cortical irregularity of the sternum with felt to be secondary to  patient motion. Stable degenerative change in the thoracic spine without acute fracture. Stable Schmorl's node superior endplate of L1. Emphysema. Dependent right lung base opacity, likely atelectasis, less likely pulmonary contusion, no subjacent fracture of the lower ribs. Right upper lobe nodule image 34 series 2 measures 10 mm, unchanged. Nodule in the upper lobe measures 1.2 cm image 28 series 2, unchanged. Minimal right-sided pleural thickening without frank pleural effusion. Heart at the upper limits normal in size. Mitral annulus calcifications. Atherosclerotic calcifications of the thoracic aorta without periaortic soft tissue stranding or fluid. Calcified hilar and mediastinal lymph nodes. No evidence of adenopathy. No acute abnormality in the included upper abdomen. IMPRESSION: 1. Right fourth through seventh anterior lateral rib fractures, fourth and sixth fractures are minimally displaced. No pneumothorax. 2. Minimally displaced fracture of the right scapular tip. 3. Dependent right lower lobe opacity is favored to be atelectasis over pulmonary contusion. 4. Right lung nodules are unchanged from exam 6 weeks prior. Continued CT follow-up is recommended to evaluate for stability. Electronically Signed   By: Rubye Oaks M.D.   On: 05/31/2015 02:29    Assessment/Plan Principal Problem:   Closed rib fracture Active Problems:   Hyperlipidemia   NICOTINE ADDICTION   Depression   Essential hypertension   DEGENERATIVE DISC DISEASE, CERVICAL SPINE, WITH MYELOPATHY   Senile osteoporosis   Aortic stenosis, severe   Cigarette smoker   COPD exacerbation (HCC)   Fall   Alcohol abuse   Closed rib fracture and minimally displaced fracture of the right scapular tip: as evidenced by CT-chest. No pneumothorax. Pt has moderate pain.  -Will admit to tele bed (for monitoring alcohol withdraw) for observation -Pain control: prn Norco -INR/PTT/type & screen -please call ortho in AM  COPD  exacerbation: pt has productive cough, shortness of breath and bilateral wheezing on auscultation, consistent with COPD exacerbation. No symptoms of flu -will admit patient to telemetry bed  -Nebulizers: scheduled Duoneb and prn albuterol -continue home spiriva inhaler -Solu-Medrol 60 mg IV bid -Oral azithromycin for 5 days.  -Mucinex for cough  -Follow up sputum culture  HLD: Last LDL was not on record -Continue home medications: crestor -Check FLP  HTN:  -Hold Cozaar due to AKI -IV hydralazine when necessary  Tobacco abuse and Alcohol abuse: -Did counseling about importance of quitting smoking -Nicotine patch -Did counseling about the importance of quitting drinking -CIWA protocol   DVT ppx: SCD  Code Status: Full code Family Communication: None at bed side.  Disposition Plan: Admit to inpatient   Date of Service 05/31/2015    Lorretta HarpIU, Deneene Tarver [facility] Pager [phone]  If 7PM-7AM, please contact night-coverage [email or web address] Password [email or web address] 05/31/2015, 4:42 AM

## 2015-05-31 NOTE — ED Notes (Signed)
Pt assisted with bed pan after being medicated for pain. Awaiting evaluation for admission by hospitalist.

## 2015-05-31 NOTE — Telephone Encounter (Signed)
-----   Message from Quintella Reichertraci R Turner, MD sent at 05/30/2015 10:12 PM EDT -----   ----- Message -----    From: Melvyn Novasarmen Dohmeier, MD    Sent: 05/30/2015   2:44 PM      To: Quintella Reichertraci R Turner, MD

## 2015-05-31 NOTE — Evaluation (Signed)
Occupational Therapy Evaluation Patient Details Name: Karina Andrews MRN: [id number] DOB: 07/05/1930 Today's Date: 05/31/2015    History of Present Illness pt fell and sustained R rib fxs on #4-7 and minimally displaced fx of R scapula tip.  PMH:  ETOH, COPD, aortic stenosis   Clinical Impression   This 82 year old female was admitted for a fall resulting in the above injuries.  She will benefit from continued OT to increase safety and independence with adls while minimizing pain. Pt was independent prior to admission and needs up to total A +2  for adls.  Goals in acute are for min to mod A    Follow Up Recommendations  SNF    Equipment Recommendations   (to be further assessed:  possibly 3:1)    Recommendations for Other Services       Precautions / Restrictions Precautions Precautions: Fall Precaution Comments: rib fxs Restrictions Weight Bearing Restrictions: No      Mobility Bed Mobility Overal bed mobility: Needs Assistance;+2 for physical assistance Bed Mobility: Supine to Sit     Supine to sit: Total assist;+2 for physical assistance     General bed mobility comments: utilized pad.  Therapists did work to decrease pain; HOB raised  Transfers Overall transfer level: Needs assistance Equipment used: Rolling walker (2 wheeled) Transfers: Sit to/from [employer] Sit to Stand: Mod assist;+2 physical assistance Stand pivot transfers: Mod assist;+2 physical assistance       General transfer comment: assist to rise and stabilize; assist to stabilize during pivot and cues for safety    Balance                                            ADL Overall ADL's : Needs assistance/impaired Eating/Feeding: NPO   Grooming: Set up;Sitting   Upper Body Bathing: Minimal assitance;Sitting   Lower Body Bathing: Moderate assistance;+2 for physical assistance;Sit to/from stand   Upper Body Dressing : Moderate assistance;Sitting    Lower Body Dressing: Maximal assistance;+2 for physical assistance;Sit to/from stand   Toilet Transfer: Moderate assistance;+2 for physical assistance;RW (to recliner)   Toileting- Clothing Manipulation and Hygiene: Total assistance;+2 for physical assistance;Sit to/from stand         General ADL Comments: pt partially crosses legs for LB adls.  +2 for safety for sit to stand.  Pt was sleeping soundly and saturated in urine. Assisted her in peri care and donning clean clothing     Vision     Perception     Praxis      Pertinent Vitals/Pain Pain Assessment: Faces Faces Pain Scale: Hurts whole lot Pain Location: R ribs when coughing Pain Descriptors / Indicators: Grimacing;Moaning Pain Intervention(s): Limited activity within patient's tolerance;Monitored during session;Repositioned     Hand Dominance     Extremity/Trunk Assessment Upper Extremity Assessment Upper Extremity Assessment: RUE deficits/detail RUE Deficits / Details: pt performed AAROM to RUE to 80 degrees; initiated movement to 30--may be due to rib fxs.  distal movement wfls           Communication Communication Communication:  (difficult to understand at times)   Cognition Arousal/Alertness: Awake/alert Behavior During Therapy: WFL for tasks assessed/performed Overall Cognitive Status: Within Functional Limits for tasks assessed                     General Comments  Exercises       Shoulder Instructions      Home Living Family/patient expects to be discharged to:: Unsure                                 Additional Comments: pt lives alone in a house with no stairs to enter. She has a walk in shower and standard commode  She does not have 24/7      Prior Functioning/Environment Level of Independence: Independent             OT Diagnosis: Acute pain   OT Problem List: Decreased strength;Decreased activity tolerance;Pain;Decreased knowledge of use of DME or  AE;Impaired UE functional use;Impaired balance (sitting and/or standing)   OT Treatment/Interventions: Self-care/ADL training;DME and/or AE instruction;Balance training;Patient/family education;Therapeutic activities    OT Goals(Current goals can be found in the care plan section) Acute Rehab OT Goals Patient Stated Goal: none stated; agreeble to therapy and OOB OT Goal Formulation: With patient Time For Goal Achievement: 06/07/15 Potential to Achieve Goals: Good ADL Goals Pt Will Perform Upper Body Bathing: with supervision;sitting Pt Will Perform Lower Body Bathing: with min assist;with adaptive equipment;sit to/from stand Pt Will Perform Upper Body Dressing: with min assist;sitting Pt Will Perform Lower Body Dressing: sit to/from stand;with mod assist Pt Will Transfer to Toilet: with min assist;bedside commode;stand pivot transfer Pt Will Perform Toileting - Clothing Manipulation and hygiene: with min assist;sit to/from stand Additional ADL Goal #1: pt will perform bed mobility, sidelying to sit on L side with min A in preparation for adls  OT Frequency: Min 2X/week   Barriers to D/C:            Co-evaluation              End of Session Nurse Communication: Mobility status (and technique to help pt lie back down)  Activity Tolerance: Patient tolerated treatment well Patient left: in chair;with call bell/phone within reach;with chair alarm set   Time: [id number] OT Time Calculation (min): 16 min Charges:  OT General Charges $OT Visit: 1 Procedure OT Evaluation $OT Eval Moderate Complexity: 1 Procedure G-Codes: OT G-codes **NOT FOR INPATIENT CLASS** Functional Assessment Tool Used: clinical judgment and observation Functional Limitation: Self care Self Care Current Status (Q6578(G8987): At least 80 percent but less than 100 percent impaired, limited or restricted Self Care Goal Status (I6962(G8988): At least 40 percent but less than 60 percent impaired, limited or restricted   Karina Andrews 05/31/2015, 2:59 PM   Marica OtterMaryellen Avanell Banwart, OTR/L [phone] 05/31/2015

## 2015-05-31 NOTE — Telephone Encounter (Signed)
Quintella Reichertraci R Turner, MD at 05/30/2015 10:11 PM     Status: Signed       Expand All Collapse All   Please refer patient to Dr. Excell Seltzerooper for evaluation for possible TAVR        Left message to call back.

## 2015-06-01 ENCOUNTER — Observation Stay (HOSPITAL_COMMUNITY): Payer: Commercial Managed Care - HMO

## 2015-06-01 ENCOUNTER — Inpatient Hospital Stay (HOSPITAL_COMMUNITY): Payer: Commercial Managed Care - HMO

## 2015-06-01 DIAGNOSIS — E876 Hypokalemia: Secondary | ICD-10-CM | POA: Diagnosis present

## 2015-06-01 DIAGNOSIS — Z720 Tobacco use: Secondary | ICD-10-CM

## 2015-06-01 DIAGNOSIS — J441 Chronic obstructive pulmonary disease with (acute) exacerbation: Secondary | ICD-10-CM

## 2015-06-01 DIAGNOSIS — F17200 Nicotine dependence, unspecified, uncomplicated: Secondary | ICD-10-CM

## 2015-06-01 DIAGNOSIS — Z961 Presence of intraocular lens: Secondary | ICD-10-CM | POA: Diagnosis present

## 2015-06-01 DIAGNOSIS — W06XXXA Fall from bed, initial encounter: Secondary | ICD-10-CM | POA: Diagnosis not present

## 2015-06-01 DIAGNOSIS — E785 Hyperlipidemia, unspecified: Secondary | ICD-10-CM

## 2015-06-01 DIAGNOSIS — W010XXA Fall on same level from slipping, tripping and stumbling without subsequent striking against object, initial encounter: Secondary | ICD-10-CM | POA: Diagnosis present

## 2015-06-01 DIAGNOSIS — E860 Dehydration: Secondary | ICD-10-CM | POA: Diagnosis present

## 2015-06-01 DIAGNOSIS — Z79899 Other long term (current) drug therapy: Secondary | ICD-10-CM | POA: Diagnosis not present

## 2015-06-01 DIAGNOSIS — S42191A Fracture of other part of scapula, right shoulder, initial encounter for closed fracture: Secondary | ICD-10-CM | POA: Diagnosis not present

## 2015-06-01 DIAGNOSIS — F329 Major depressive disorder, single episode, unspecified: Secondary | ICD-10-CM | POA: Diagnosis present

## 2015-06-01 DIAGNOSIS — I10 Essential (primary) hypertension: Secondary | ICD-10-CM

## 2015-06-01 DIAGNOSIS — Z66 Do not resuscitate: Secondary | ICD-10-CM | POA: Diagnosis present

## 2015-06-01 DIAGNOSIS — F10239 Alcohol dependence with withdrawal, unspecified: Secondary | ICD-10-CM | POA: Diagnosis not present

## 2015-06-01 DIAGNOSIS — D649 Anemia, unspecified: Secondary | ICD-10-CM | POA: Diagnosis present

## 2015-06-01 DIAGNOSIS — D696 Thrombocytopenia, unspecified: Secondary | ICD-10-CM | POA: Diagnosis present

## 2015-06-01 DIAGNOSIS — J984 Other disorders of lung: Secondary | ICD-10-CM

## 2015-06-01 DIAGNOSIS — R471 Dysarthria and anarthria: Secondary | ICD-10-CM | POA: Diagnosis present

## 2015-06-01 DIAGNOSIS — Z515 Encounter for palliative care: Secondary | ICD-10-CM | POA: Diagnosis not present

## 2015-06-01 DIAGNOSIS — Z8249 Family history of ischemic heart disease and other diseases of the circulatory system: Secondary | ICD-10-CM | POA: Diagnosis not present

## 2015-06-01 DIAGNOSIS — S299XXA Unspecified injury of thorax, initial encounter: Secondary | ICD-10-CM | POA: Diagnosis not present

## 2015-06-01 DIAGNOSIS — F17210 Nicotine dependence, cigarettes, uncomplicated: Secondary | ICD-10-CM | POA: Diagnosis present

## 2015-06-01 DIAGNOSIS — Y92230 Patient room in hospital as the place of occurrence of the external cause: Secondary | ICD-10-CM | POA: Diagnosis not present

## 2015-06-01 DIAGNOSIS — S01111A Laceration without foreign body of right eyelid and periocular area, initial encounter: Secondary | ICD-10-CM | POA: Diagnosis not present

## 2015-06-01 DIAGNOSIS — S12000A Unspecified displaced fracture of first cervical vertebra, initial encounter for closed fracture: Secondary | ICD-10-CM | POA: Diagnosis not present

## 2015-06-01 DIAGNOSIS — F1010 Alcohol abuse, uncomplicated: Secondary | ICD-10-CM

## 2015-06-01 DIAGNOSIS — I350 Nonrheumatic aortic (valve) stenosis: Secondary | ICD-10-CM

## 2015-06-01 DIAGNOSIS — F0390 Unspecified dementia without behavioral disturbance: Secondary | ICD-10-CM | POA: Diagnosis present

## 2015-06-01 DIAGNOSIS — Z9049 Acquired absence of other specified parts of digestive tract: Secondary | ICD-10-CM | POA: Diagnosis not present

## 2015-06-01 DIAGNOSIS — S0240CA Maxillary fracture, right side, initial encounter for closed fracture: Secondary | ICD-10-CM | POA: Diagnosis not present

## 2015-06-01 DIAGNOSIS — M5000 Cervical disc disorder with myelopathy, unspecified cervical region: Secondary | ICD-10-CM | POA: Diagnosis present

## 2015-06-01 DIAGNOSIS — S2231XA Fracture of one rib, right side, initial encounter for closed fracture: Secondary | ICD-10-CM | POA: Diagnosis present

## 2015-06-01 DIAGNOSIS — Y92009 Unspecified place in unspecified non-institutional (private) residence as the place of occurrence of the external cause: Secondary | ICD-10-CM | POA: Diagnosis not present

## 2015-06-01 DIAGNOSIS — S2241XD Multiple fractures of ribs, right side, subsequent encounter for fracture with routine healing: Secondary | ICD-10-CM | POA: Diagnosis not present

## 2015-06-01 DIAGNOSIS — S2231XD Fracture of one rib, right side, subsequent encounter for fracture with routine healing: Secondary | ICD-10-CM

## 2015-06-01 DIAGNOSIS — S2241XA Multiple fractures of ribs, right side, initial encounter for closed fracture: Secondary | ICD-10-CM | POA: Diagnosis present

## 2015-06-01 DIAGNOSIS — H1130 Conjunctival hemorrhage, unspecified eye: Secondary | ICD-10-CM | POA: Diagnosis not present

## 2015-06-01 DIAGNOSIS — M810 Age-related osteoporosis without current pathological fracture: Secondary | ICD-10-CM | POA: Diagnosis present

## 2015-06-01 LAB — BASIC METABOLIC PANEL
Anion gap: 8 (ref 5–15)
BUN: 13 mg/dL (ref 6–20)
CHLORIDE: 112 mmol/L — AB (ref 101–111)
CO2: 24 mmol/L (ref 22–32)
CREATININE: 0.46 mg/dL (ref 0.44–1.00)
Calcium: 8.7 mg/dL — ABNORMAL LOW (ref 8.9–10.3)
GFR calc Af Amer: >60 mL/min (ref >60)
GFR calc non Af Amer: >60 mL/min (ref >60)
Glucose, Bld: 139 mg/dL — ABNORMAL HIGH (ref 65–99)
Potassium: 4.2 mmol/L (ref 3.5–5.1)
Sodium: 144 mmol/L (ref 135–145)

## 2015-06-01 LAB — CBC
HEMATOCRIT: 32.1 % — AB (ref 36.0–46.0)
HEMOGLOBIN: 10.7 g/dL — AB (ref 12.0–15.0)
MCH: 31.2 pg (ref 26.0–34.0)
MCHC: 33.3 g/dL (ref 30.0–36.0)
MCV: 93.6 fL (ref 78.0–100.0)
Platelets: 133 10*3/uL — ABNORMAL LOW (ref 150–400)
RBC: 3.43 MIL/uL — ABNORMAL LOW (ref 3.87–5.11)
RDW: 13.3 % (ref 11.5–15.5)
WBC: 5.4 10*3/uL (ref 4.0–10.5)

## 2015-06-01 LAB — GLUCOSE, CAPILLARY: Glucose-Capillary: 126 mg/dL — ABNORMAL HIGH (ref 65–99)

## 2015-06-01 MED ORDER — ALBUTEROL SULFATE (2.5 MG/3ML) 0.083% IN NEBU
2.5000 mg | INHALATION_SOLUTION | RESPIRATORY_TRACT | Status: DC | PRN
  Filled 2015-06-01: qty 3

## 2015-06-01 MED ORDER — IBUPROFEN 200 MG PO TABS
400.0000 mg | ORAL_TABLET | Freq: Four times a day (QID) | ORAL | Status: DC
  Administered 2015-06-01 – 2015-06-04 (×9): 400 mg via ORAL
  Filled 2015-06-01 (×10): qty 2

## 2015-06-01 MED ORDER — ENOXAPARIN SODIUM 40 MG/0.4ML ~~LOC~~ SOLN
40.0000 mg | SUBCUTANEOUS | Status: DC
  Administered 2015-06-01: 40 mg via SUBCUTANEOUS
  Filled 2015-06-01: qty 0.4

## 2015-06-01 MED ORDER — LOSARTAN POTASSIUM 50 MG PO TABS
50.0000 mg | ORAL_TABLET | Freq: Every day | ORAL | Status: DC
  Administered 2015-06-01 – 2015-06-02 (×2): 50 mg via ORAL
  Filled 2015-06-01 (×2): qty 1

## 2015-06-01 MED ORDER — PREDNISONE 20 MG PO TABS
40.0000 mg | ORAL_TABLET | Freq: Every day | ORAL | Status: DC
  Administered 2015-06-02: 40 mg via ORAL
  Filled 2015-06-01: qty 2

## 2015-06-01 MED ORDER — HYDROCODONE-ACETAMINOPHEN 5-325 MG PO TABS
0.5000 | ORAL_TABLET | ORAL | Status: DC | PRN
  Administered 2015-06-01 – 2015-06-02 (×5): 0.5 via ORAL
  Filled 2015-06-01 (×5): qty 1

## 2015-06-01 NOTE — Progress Notes (Signed)
[facility] Progress Note   KENNICE FINNIE  MVH:[id number]  DOB: 01-31-1931  DOA: 05/30/2015 PCP: Oliver Barre, MD  Brief narrative: SUBRINA VECCHIARELLI is a 82 y.o. female with HTN, HLD, COPD, ongong smoking severe AS, alcohol abuse who presents after she tripped and fell onto her sight sided sustaining right rib and scapula fractures.   Subjective: No complaints of cough, dyspnea, nausea, vomiting. Has pain in right lower rib cage.   Assessment/Plan: Principal Problem:   Closed rib fractures and scapular fracture - right anterior 4th through 7th ribs and right scapular tip - incentive spirometry, pain control  Active Problems: COPD exacerbation - wean steroids as she is not wheezing now- not dyspneic. Pulse ox 92% on room air. - cont Mucinex DM,Duonebs, Z pak  Smoker - she tells me that she smokes 1ppd- cont Nicotine patch- advised her to stop - given a script for Chantix as outpt but "is still deciding" whether to start it  ETOH abuse and withdrawal - tells me she drinks 2 glasses of wine a day- cont to follow for withdrawal  Dysarthria - MRI done as outpt recently negative for CVA- Neuro, Dr Dohmeier evaluated her on on 4/5 and feels speech issues are related to breathing issues from Al stenosis  Ao Stenosis - symptom of dyspnea with exertion- Dr Mayford Knife following her and considering TAVR- has referred her to Dr Excell Seltzer for an eval  Pulmonary nodules - follows with Dr Sherene Sires for COPD-either he or his PCP can cont oupt surveillence    Hyperlipidemia - Crestor    Essential hypertension - Cozaar   Antibiotics: Anti-infectives    Start     Dose/Rate Route Frequency Ordered Stop   06/01/15 1000  azithromycin (ZITHROMAX) tablet 250 mg     250 mg Oral Daily 05/31/15 0441 06/05/15 0959   05/31/15 1000  azithromycin (ZITHROMAX) tablet 500 mg     500 mg Oral Daily 05/31/15 0441 05/31/15 1057     Code Status:     Code Status Orders        Start     Ordered    05/31/15 0437  Full code   Continuous     05/31/15 0438    Code Status History    Date Active Date Inactive Code Status Order ID Comments User Context   09/16/2013  2:41 PM 09/19/2013  7:17 PM Full Code [id number]  Joseph Art, DO Inpatient     Family Communication: Disposition Plan: will go to SNF DVT prophylaxis: Lovenox Consultants:  Procedures:     Objective: Filed Weights   05/31/15 0601  Weight: 61.689 kg (136 lb)    Intake/Output Summary (Last 24 hours) at 06/01/15 1321 Last data filed at 06/01/15 0750  Gross per 24 hour  Intake 2646.67 ml  Output      0 ml  Net 2646.67 ml     Vitals Filed Vitals:   05/31/15 2050 05/31/15 2305 06/01/15 0703 06/01/15 0728  BP:  128/57 134/65   Pulse:  88 86 89  Temp:  97.8 F (36.6 C) 97.8 F (36.6 C)   TempSrc:  Oral Oral   Resp:  Height:      Weight:      SpO2: 96% 97% 95% 95%    Exam:  General:  Pt is alert, not in acute distress  HEENT: No icterus, No thrush, oral mucosa moist  Cardiovascular: regular rate and rhythm, S1/S2 No murmur  Respiratory: clear to auscultation bilaterally  Abdomen: Soft, +Bowel sounds, non tender, non distended, no guarding  MSK: No cyanosis or clubbing- no pedal edema   Data Reviewed: Basic Metabolic Panel:  Recent Labs Lab 05/31/15 0046 05/31/15 0447 06/01/15 0413  NA 142 140 144  K 4.0 4.4 4.2  CL 108 107 112*  CO2 GLUCOSE 113* 119* 139*  BUN CREATININE 0.53 0.58 0.46  CALCIUM 9.2 9.2 8.7*   Liver Function Tests:  Recent Labs Lab 05/31/15 0046  AST 23  ALT 23  ALKPHOS 79  BILITOT 0.2*  PROT 7.3  ALBUMIN 4.1   No results for input(s): LIPASE, AMYLASE in the last 168 hours. No results for input(s): AMMONIA in the last 168 hours. CBC:  Recent Labs Lab 05/31/15 0046 05/31/15 0447 06/01/15 0413  WBC 10.3 8.4 5.4  NEUTROABS 8.3*  --   --   HGB 12.5 12.3 10.7*  HCT 38.1 36.7 32.1*  MCV 93.8 93.9 93.6  PLT 164 162 133*    Cardiac Enzymes: No results for input(s): CKTOTAL, CKMB, CKMBINDEX, TROPONINI in the last 168 hours. BNP (last 3 results) No results for input(s): BNP in the last 8760 hours.  ProBNP (last 3 results)  Recent Labs  04/11/15 1731  PROBNP 33.0    CBG:  Recent Labs Lab 05/31/15 0758 06/01/15 0810  GLUCAP 133* 126*    No results found for this or any previous visit (from the past 240 hour(s)).   Studies: Dg Chest 2 View  05/30/2015  CLINICAL DATA:  Fall today with right-sided anterior pleuritic chest pain and rib pain. EXAM: CHEST - 2 VIEW COMPARISON:  04/11/2015 FINDINGS: Mildly displaced acute fracture is identified of the lateral right sixth rib. There may also be a fracture of the adjacent fifth rib. No pneumothorax identified. No edema or pleural fluid. The heart size and mediastinal contours are normal. IMPRESSION: At least one acute fracture is identified of the right sixth rib. There may be an adjacent fifth rib fracture. No pneumothorax identified. Electronically Signed   By: Irish Lack M.D.   On: 05/30/2015 22:51   Ct Chest Wo Contrast  05/31/2015  CLINICAL DATA:  Fall onto right-sided tonight with rib fracture on radiograph. EXAM: CT CHEST WITHOUT CONTRAST TECHNIQUE: Multidetector CT imaging of the chest was performed following the standard protocol without IV contrast. COMPARISON:  Chest radiograph yesterday.  Chest CT 10/18/2015 FINDINGS: Patient breathing motion artifact partially limits assessment. Minimally displaced fractures of anterior right fourth and lateral sixth ribs, with nondisplaced fracture of the anterior lateral right fifth and seventh ribs. No pneumothorax. Minimally displaced fracture of the right scapular tip. Cortical irregularity of the sternum with felt to be secondary to patient motion. Stable degenerative change in the thoracic spine without acute fracture. Stable Schmorl's node superior endplate of L1. Emphysema. Dependent right lung base opacity,  likely atelectasis, less likely pulmonary contusion, no subjacent fracture of the lower ribs. Right upper lobe nodule image 34 series 2 measures 10 mm, unchanged. Nodule in the upper lobe measures 1.2 cm image 28 series 2, unchanged. Minimal right-sided pleural thickening without frank pleural effusion. Heart at the upper limits normal in size. Mitral annulus calcifications. Atherosclerotic calcifications of the thoracic aorta without periaortic soft tissue stranding or fluid. Calcified hilar and mediastinal lymph nodes. No evidence of adenopathy. No acute abnormality in the included upper abdomen. IMPRESSION: 1. Right fourth through seventh anterior lateral rib fractures, fourth and sixth fractures are minimally displaced. No pneumothorax. 2. Minimally  displaced fracture of the right scapular tip. 3. Dependent right lower lobe opacity is favored to be atelectasis over pulmonary contusion. 4. Right lung nodules are unchanged from exam 6 weeks prior. Continued CT follow-up is recommended to evaluate for stability. Electronically Signed   By: Rubye OaksMelanie  Ehinger M.D.   On: 05/31/2015 02:29   Dg Chest Port 1 View  06/01/2015  CLINICAL DATA:  82 year old female status post fall with right fourth through seventh rib fractures. Initial encounter. EXAM: PORTABLE CHEST 1 VIEW COMPARISON:  Chest CT 05/31/2015 and earlier. FINDINGS: Portable AP semi upright view at 0448 hours. Increased pulmonary vascularity bilaterally since 05/30/2015. Multilevel right lateral rib fractures again noted. Lower lung volumes. Stable cardiac size and mediastinal contours. No pneumothorax or pleural effusion. Mildly increased right hilar and infrahilar opacity. Calcified aortic atherosclerosis. Cervical ACDF hardware re- demonstrated. IMPRESSION: 1. Lower lung volumes. No pneumothorax or pleural effusion identified. 2. Acute pulmonary vascular congestion/mild interstitial edema. 3. Increased right hilar and lung base opacity, favor atelectasis.  Electronically Signed   By: Odessa FlemingH  Hall M.D.   On: 06/01/2015 07:17    Scheduled Meds:  Scheduled Meds: . azithromycin  250 mg Oral Daily  . folic acid  1 mg Oral Daily  . ibuprofen  400 mg Oral QID  . ipratropium-albuterol  3 mL Nebulization TID  . LORazepam  0-4 mg Intravenous Q6H   Followed by  . [START ON 06/02/2015] LORazepam  0-4 mg Intravenous Q12H  . multivitamin with minerals  1 tablet Oral Daily  . nicotine  21 mg Transdermal Daily  . [START ON 06/02/2015] predniSONE  40 mg Oral Q breakfast  . rosuvastatin  10 mg Oral Daily  . sodium chloride flush  3 mL Intravenous Q12H  . thiamine  100 mg Oral Daily   Or  . thiamine  100 mg Intravenous Daily  . tiotropium  18 mcg Inhalation Daily   Continuous Infusions:   Time spent on care of this patient: 35 min   Amana Bouska, MD 06/01/2015, 1:21 PM    [facility] Office  [phone] Pager - Text Page per [email or web address] If 7PM-7AM, please contact night-coverage [email or web address]

## 2015-06-01 NOTE — Progress Notes (Signed)
CSW continuing to follow.   CSW received return phone call from pt daughter, [name] who states that she toured facilities and [facility]   CSW [facility] and confirmed facility can accept pt and can accept pt over the weekend.   CSW contacted pt insurance, [employer] regarding insurance authorization and obtained insurance authorization for [facility](auth #: [id number]).   Weekend CSW to facilitate pt discharge needs [facility] when pt medically ready.  Loletta SpecterSuzanna Telicia Hodgkiss, MSW, LCSW Clinical Social Work [phone]

## 2015-06-01 NOTE — Clinical Social Work Placement (Signed)
   CLINICAL SOCIAL WORK PLACEMENT  NOTE  Date:  06/01/2015  Patient Details  Name: Karina Andrews MRN: [id number] Date of Birth: 07/13/1930  Clinical Social Work is seeking post-discharge placement for this patient at the Skilled  Nursing Facility level of care (*CSW will initial, date and re-position this form in  chart as items are completed):  Yes   Patient/family provided with [facility] Clinical Social Work Department's list of facilities offering this level of care within the geographic area requested by the patient (or if unable, by the patient's family).  Yes   Patient/family informed of their freedom to choose among providers that offer the needed level of care, that participate in Medicare, Medicaid or managed care program needed by the patient, have an available bed and are willing to accept the patient.  Yes   Patient/family informed of [facility]'s ownership interest in [facility] and [facility], as well as of the fact that they are under no obligation to receive care at these facilities.  PASRR submitted to EDS on       PASRR number received on       Existing PASRR number confirmed on 06/01/15     FL2 transmitted to all facilities in geographic area requested by pt/family on 06/01/15     FL2 transmitted to all facilities within larger geographic area on       Patient informed that his/her managed care company has contracts with or will negotiate with certain facilities, including the following:            Patient/family informed of bed offers received.  Patient chooses bed at       Physician recommends and patient chooses bed at      Patient to be transferred to   on  .  Patient to be transferred to facility by       Patient family notified on   of transfer.  Name of family member notified:        PHYSICIAN Please sign FL2     Additional Comment:    _______________________________________________ Orson EvaKIDD, SUZANNA A, LCSW 06/01/2015, 10:20  AM

## 2015-06-01 NOTE — NC FL2 (Deleted)
[place] MEDICAID FL2 LEVEL OF CARE SCREENING TOOL     IDENTIFICATION  Patient Name: Karina Andrews Birthdate: [birth year] Sex: female Admission Date (Current Location): 05/30/2015  [facility] [place]   Facility and Address:  [facility],  [address], [place] [id number]      Provider Number: [id number]  Attending Physician Name and Address:  Calvert CantorSaima Rizwan, MD  Relative Name and Phone Number:       Current Level of Care: Hospital Recommended Level of Care: Skilled Nursing Facility Prior Approval Number:    Date Approved/Denied:   PASRR Number: [id number]  Discharge Plan: SNF    Current Diagnoses: Patient Active Problem List   Diagnosis Date Noted  . Fall 05/31/2015  . Closed rib fracture 05/31/2015  . Alcohol abuse 05/31/2015  . Dysarthria 05/17/2015  . Wellness examination 05/11/2015  . Indeterminate pulmonary nodules 05/11/2015  . COPD exacerbation (HCC) 05/05/2015  . Dyspnea 04/11/2015  . Cigarette smoker 04/11/2015  . Aortic stenosis, severe 06/15/2013  . Insomnia 06/15/2013  . Senile osteoporosis   . 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 Hyperlipidemia 12/12/2008  . Depression 12/12/2008  . DEGENERATIVE DISC DISEASE, CERVICAL SPINE, WITH MYELOPATHY 12/12/2008  . NICOTINE ADDICTION 06/14/2008  . Essential hypertension 06/14/2008  . OBSTRUCTIVE CHRONIC BRONCHITIS 06/14/2008    Orientation RESPIRATION BLADDER Height & Weight     Self  O2 (2L O2) Continent Weight: 136 lb (61.689 kg) Height:  5\' 4"  (162.6 cm)  BEHAVIORAL SYMPTOMS/MOOD NEUROLOGICAL BOWEL NUTRITION STATUS   (no behaviors)  (NONE) Continent Diet (Diet Regular)  AMBULATORY STATUS COMMUNICATION OF NEEDS Skin   Extensive Assist Verbally Normal                       Personal Care Assistance Level of Assistance  Bathing, Feeding, Dressing Bathing Assistance: Maximum assistance Feeding assistance: Limited assistance Dressing Assistance:  Maximum assistance     Functional Limitations Info  Sight, Hearing, Speech Sight Info: Adequate Hearing Info: Adequate Speech Info: Adequate    SPECIAL CARE FACTORS FREQUENCY  PT (By licensed PT), OT (By licensed OT)     PT Frequency: 5 x a week OT Frequency: 5 x a week            Contractures Contractures Info: Not present    Additional Factors Info  Code Status, Allergies Code Status Info: FULL code status Allergies Info: No Known Allergies           Current Medications (06/01/2015):  This is the current hospital active medication list Current Facility-Administered Medications  Medication Dose Route Frequency Provider Last Rate Last Dose  . acetaminophen (TYLENOL) tablet 650 mg  650 mg Oral Q6H PRN Lorretta HarpXilin Niu, MD       Or  . acetaminophen (TYLENOL) suppository 650 mg  650 mg Rectal Q6H PRN Lorretta HarpXilin Niu, MD      . albuterol (PROVENTIL) (2.5 MG/3ML) 0.083% nebulizer solution 2.5 mg  2.5 mg Nebulization Q2H PRN Lorretta HarpXilin Niu, MD   2.5 mg at 05/31/15 0509  . azithromycin (ZITHROMAX) tablet 250 mg  250 mg Oral Daily Lorretta HarpXilin Niu, MD   250 mg at 06/01/15 0830  . dextromethorphan-guaiFENesin (MUCINEX DM) 30-600 MG per 12 hr tablet 1 tablet  1 tablet Oral BID PRN Lorretta HarpXilin Niu, MD      . folic acid (FOLVITE) tablet 1 mg  1 mg Oral Daily Lorretta HarpXilin Niu, MD   1 mg at 06/01/15 0830  . hydrALAZINE (  APRESOLINE) injection 5 mg  5 mg Intravenous Q2H PRN Lorretta Harp, MD      . HYDROcodone-acetaminophen (NORCO/VICODIN) 5-325 MG per tablet 0.5 tablet  0.5 tablet Oral Q4H PRN Calvert Cantor, MD      . hydrOXYzine (ATARAX/VISTARIL) tablet 25 mg  25 mg Oral Q6H PRN Lorretta Harp, MD      . ibuprofen (ADVIL,MOTRIN) tablet 400 mg  400 mg Oral QID Calvert Cantor, MD      . ipratropium-albuterol (DUONEB) 0.5-2.5 (3) MG/3ML nebulizer solution 3 mL  3 mL Nebulization TID Starleen Arms, MD   3 mL at 06/01/15 0728  . LORazepam (ATIVAN) injection 0-4 mg  0-4 mg Intravenous Q6H Lorretta Harp, MD   2 mg at 06/01/15 0401    Followed by  . [START ON 06/02/2015] LORazepam (ATIVAN) injection 0-4 mg  0-4 mg Intravenous Q12H Lorretta Harp, MD      . LORazepam (ATIVAN) tablet 1 mg  1 mg Oral Q6H PRN Lorretta Harp, MD       Or  . LORazepam (ATIVAN) injection 1 mg  1 mg Intravenous Q6H PRN Lorretta Harp, MD      . multivitamin with minerals tablet 1 tablet  1 tablet Oral Daily Lorretta Harp, MD   1 tablet at 06/01/15 0830  . nicotine (NICODERM CQ - dosed in mg/24 hours) patch 21 mg  21 mg Transdermal Daily Lorretta Harp, MD   21 mg at 06/01/15 0830  . ondansetron (ZOFRAN) tablet 4 mg  4 mg Oral Q6H PRN Lorretta Harp, MD       Or  . ondansetron [facility]) injection 4 mg  4 mg Intravenous Q6H PRN Lorretta Harp, MD   4 mg at 05/31/15 2231  . [START ON 06/02/2015] predniSONE (DELTASONE) tablet 40 mg  40 mg Oral Q breakfast Saima Rizwan, MD      . rosuvastatin (CRESTOR) tablet 10 mg  10 mg Oral Daily Lorretta Harp, MD   10 mg at 05/31/15 1735  . sodium chloride flush (NS) 0.9 % injection 3 mL  3 mL Intravenous Q12H Lorretta Harp, MD   3 mL at 06/01/15 0831  . thiamine (VITAMIN B-1) tablet 100 mg  100 mg Oral Daily Lorretta Harp, MD   100 mg at 06/01/15 1610   Or  . thiamine (B-1) injection 100 mg  100 mg Intravenous Daily Lorretta Harp, MD   100 mg at 05/31/15 1058  . tiotropium (SPIRIVA) inhalation capsule 18 mcg  18 mcg Inhalation Daily Lorretta Harp, MD   18 mcg at 05/31/15 1143     Discharge Medications: Please see discharge summary for a list of discharge medications.  Relevant Imaging Results:  Relevant Lab Results:   Additional Information SSN: [id number]  Kingsly Kloepfer A, LCSW

## 2015-06-01 NOTE — Clinical Social Work Placement (Signed)
   CLINICAL SOCIAL WORK PLACEMENT  NOTE  Date:  06/01/2015  Patient Details  Name: [name] Bainter MRN: [id number] Date of Birth: 06/08/1930  Clinical Social Work is seeking post-discharge placement for this patient at the Skilled  Nursing Facility level of care (*CSW will initial, date and re-position this form in  chart as items are completed):  Yes   Patient/family provided with [facility] Clinical Social Work Department's list of facilities offering this level of care within the geographic area requested by the patient (or if unable, by the patient's family).  Yes   Patient/family informed of their freedom to choose among providers that offer the needed level of care, that participate in Medicare, Medicaid or managed care program needed by the patient, have an available bed and are willing to accept the patient.  Yes   Patient/family informed of [place] [facility] [place]'s ownership interest in [facility] and [facility], as well as of the fact that they are under no obligation to receive care at these facilities.  PASRR submitted to EDS on       PASRR number received on       Existing PASRR number confirmed on 06/01/15     FL2 transmitted to all facilities in geographic area requested by pt/family on 06/01/15     FL2 transmitted to all facilities within larger geographic area on       Patient informed that his/her managed care company has contracts with or will negotiate with certain facilities, including the following:        Yes   Patient/family informed of bed offers received.  [facility]     Physician recommends and patient chooses bed at      [facility] on  .  Patient to be transferred to facility by       Patient family notified on   of transfer.  Name of family member notified:        PHYSICIAN Please sign FL2     Additional Comment:    _______________________________________________ Orson EvaKIDD, SUZANNA A,  LCSW 06/01/2015, 5:22 PM

## 2015-06-01 NOTE — Progress Notes (Signed)
CSW continuing to follow.   CS[facility] that facility does not have availability to accept pt to SNF level of care.  CSW contacted pt daughter, [name] and notified pt daughter that [facility] unable to offer placement. CSW provided SNF bed offers. Pt daughter touring facilities and plans to provide CSW a decision by the end of the day.  CSW to continue to follow.  Loletta SpecterSuzanna Kidd, MSW, LCSW Clinical Social Work [phone]

## 2015-06-01 NOTE — Clinical Social Work Note (Signed)
Clinical Social Work Assessment  Patient Details  Name: Karina Andrews Ivens MRN: [id number] Date of Birth: Dec 07, 1930  Date of referral:  06/01/15               Reason for consult:  Discharge Planning                Permission sought to share information with:  Family Supports Permission granted to share information::     Name::     Sandi MariscalKristina Cuellar  Agency::     Relationship::  daughter  Contact Information:  [phone]  Housing/Transportation Living arrangements for the past 2 months:  Single Family Home Source of Information:  Adult Children Patient Interpreter Needed:  None Criminal Activity/Legal Involvement Pertinent to Current Situation/Hospitalization:  No - Comment as needed Significant Relationships:  Adult Children Lives with:  Self Do you feel safe going back to the place where you live?  No Need for family participation in patient care:  Yes (Comment)  Care giving concerns:  Pt admitted from home. Pt with closed rib fracture impacting her ability to perform ADL's. PT/OT recommending SNF.    Social Worker assessment / plan:    CSW received referral for New SNF.  CSW spoke with pt daughter, [name] via telephone as pt oriented to person only. Pt daughter understands recommendation for rehab at SNF. Pt daughter reports that she would like CSW to explore if [facility] is an option as pt has paid a deposit and [facility] the end of June/beginning of July. Pt daughter is agreeable to [place] [facility] [place] given that [facility] may or may not be an option. Pt daughter reports that [facility] has [facility] before, but pt daughter does not wish for pt to return to [facility].   CSW completed FL2 and initiated [facility] [place]. CSW left message with [facility], awaiting call back.  CSW to follow up with pt daughter regarding SNF bed offers. Pt is [employer] [employer] which  requires authorization prior to discharge.  CSW to continue to follow to provide support and assist with pt disposition needs.   Employment status:  Retired Database administratornsurance information:  Managed Medicare PT Recommendations:  Skilled Nursing Facility Information / Referral to community resources:  Skilled Nursing Facility  Patient/Family's Response to care:  Pt oriented to person only and unable to participate in assessment. Pt daughter hopeful for [facility], but open to other options given unsure at this time if [facility] would be able to accept since pt has not yet moved into community.  Patient/Family's Understanding of and Emotional Response to Diagnosis, Current Treatment, and Prognosis:  Pt daughter displayed knowledge surrounding pt diagnosis and treatment plan.   Emotional Assessment Appearance:  Appears stated age Attitude/Demeanor/Rapport:  Unable to Assess Affect (typically observed):  Unable to Assess Orientation:  Oriented to Self Alcohol / Substance use:  Not Applicable Psych involvement (Current and /or in the community):  No (Comment)  Discharge Needs  Concerns to be addressed:  Discharge Planning Concerns Readmission within the last 30 days:  No Current discharge risk:  Physical Impairment Barriers to Discharge:  Continued Medical Work up   Orson EvaKIDD, Gerome Kokesh A, LCSW 06/01/2015, 10:13 AM [phone]

## 2015-06-01 NOTE — [place] FL2 (Signed)
[place] MEDICAID FL2 LEVEL OF CARE SCREENING TOOL     IDENTIFICATION  Patient Name: Karina Andrews Birthdate: October 21, 1930 Sex: female Admission Date (Current Location): 05/30/2015  [facility] [place]   Facility and Address:  [facility],  [address], [place] [id number]      Provider Number: [id number]  Attending Physician Name and Address:  Calvert CantorSaima Rizwan, MD  Relative Name and Phone Number:       Current Level of Care: Hospital Recommended Level of Care: Skilled Nursing Facility Prior Approval Number:    Date Approved/Denied:   PASRR Number: [id number]  Discharge Plan: SNF    Current Diagnoses: Patient Active Problem List   Diagnosis Date Noted  . Fall 05/31/2015  . Closed rib fracture 05/31/2015  . Alcohol abuse 05/31/2015  . Dysarthria 05/17/2015  . Wellness examination 05/11/2015  . Indeterminate pulmonary nodules 05/11/2015  . COPD exacerbation (HCC) 05/05/2015  . Dyspnea 04/11/2015  . Cigarette smoker 04/11/2015  . Aortic stenosis, severe 06/15/2013  . Insomnia 06/15/2013  . Senile osteoporosis   . 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 Hyperlipidemia 12/12/2008  . Depression 12/12/2008  . DEGENERATIVE DISC DISEASE, CERVICAL SPINE, WITH MYELOPATHY 12/12/2008  . NICOTINE ADDICTION 06/14/2008  . Essential hypertension 06/14/2008  . OBSTRUCTIVE CHRONIC BRONCHITIS 06/14/2008    Orientation RESPIRATION BLADDER Height & Weight     Self  O2 (2L O2) Continent Weight: 136 lb (61.689 kg) Height:  5\' 4"  (162.6 cm)  BEHAVIORAL SYMPTOMS/MOOD NEUROLOGICAL BOWEL NUTRITION STATUS  Other (Comment) (agitation at times)  (NONE) Continent Diet (Diet Regular)  AMBULATORY STATUS COMMUNICATION OF NEEDS Skin   Extensive Assist Verbally Normal                       Personal Care Assistance Level of Assistance  Bathing, Feeding, Dressing Bathing Assistance: Maximum assistance Feeding assistance: Limited  assistance Dressing Assistance: Maximum assistance     Functional Limitations Info  Sight, Hearing, Speech Sight Info: Adequate Hearing Info: Adequate Speech Info: Adequate    SPECIAL CARE FACTORS FREQUENCY  PT (By licensed PT), OT (By licensed OT)     PT Frequency: 5 x a week OT Frequency: 5 x a week            Contractures Contractures Info: Not present    Additional Factors Info  Code Status, Allergies Code Status Info: FULL code status Allergies Info: No Known Allergies           Current Medications (06/01/2015):  This is the current hospital active medication list Current Facility-Administered Medications  Medication Dose Route Frequency Provider Last Rate Last Dose  . acetaminophen (TYLENOL) tablet 650 mg  650 mg Oral Q6H PRN Lorretta HarpXilin Niu, MD       Or  . acetaminophen (TYLENOL) suppository 650 mg  650 mg Rectal Q6H PRN Lorretta HarpXilin Niu, MD      . albuterol (PROVENTIL) (2.5 MG/3ML) 0.083% nebulizer solution 2.5 mg  2.5 mg Nebulization Q2H PRN Lorretta HarpXilin Niu, MD   2.5 mg at 05/31/15 0509  . azithromycin (ZITHROMAX) tablet 250 mg  250 mg Oral Daily Lorretta HarpXilin Niu, MD   250 mg at 06/01/15 0830  . dextromethorphan-guaiFENesin (MUCINEX DM) 30-600 MG per 12 hr tablet 1 tablet  1 tablet Oral BID PRN Lorretta HarpXilin Niu, MD      . folic acid (FOLVITE) tablet 1 mg  1 mg Oral Daily Lorretta HarpXilin Niu, MD   1 mg at 06/01/15 0830  .  hydrALAZINE (APRESOLINE) injection 5 mg  5 mg Intravenous Q2H PRN Lorretta Harp, MD      . HYDROcodone-acetaminophen (NORCO/VICODIN) 5-325 MG per tablet 0.5 tablet  0.5 tablet Oral Q4H PRN Calvert Cantor, MD      . hydrOXYzine (ATARAX/VISTARIL) tablet 25 mg  25 mg Oral Q6H PRN Lorretta Harp, MD      . ibuprofen (ADVIL,MOTRIN) tablet 400 mg  400 mg Oral QID Calvert Cantor, MD   400 mg at 06/01/15 1109  . ipratropium-albuterol (DUONEB) 0.5-2.5 (3) MG/3ML nebulizer solution 3 mL  3 mL Nebulization TID Starleen Arms, MD   3 mL at 06/01/15 0728  . LORazepam (ATIVAN) injection 0-4 mg  0-4 mg  Intravenous Q6H Lorretta Harp, MD   2 mg at 06/01/15 1131   Followed by  . [START ON 06/02/2015] LORazepam (ATIVAN) injection 0-4 mg  0-4 mg Intravenous Q12H Lorretta Harp, MD      . LORazepam (ATIVAN) tablet 1 mg  1 mg Oral Q6H PRN Lorretta Harp, MD       Or  . LORazepam (ATIVAN) injection 1 mg  1 mg Intravenous Q6H PRN Lorretta Harp, MD      . multivitamin with minerals tablet 1 tablet  1 tablet Oral Daily Lorretta Harp, MD   1 tablet at 06/01/15 0830  . nicotine (NICODERM CQ - dosed in mg/24 hours) patch 21 mg  21 mg Transdermal Daily Lorretta Harp, MD   21 mg at 06/01/15 0830  . ondansetron (ZOFRAN) tablet 4 mg  4 mg Oral Q6H PRN Lorretta Harp, MD       Or  . ondansetron [facility]) injection 4 mg  4 mg Intravenous Q6H PRN Lorretta Harp, MD   4 mg at 05/31/15 2231  . [START ON 06/02/2015] predniSONE (DELTASONE) tablet 40 mg  40 mg Oral Q breakfast Saima Rizwan, MD      . rosuvastatin (CRESTOR) tablet 10 mg  10 mg Oral Daily Lorretta Harp, MD   10 mg at 05/31/15 1735  . sodium chloride flush (NS) 0.9 % injection 3 mL  3 mL Intravenous Q12H Lorretta Harp, MD   3 mL at 06/01/15 0831  . thiamine (VITAMIN B-1) tablet 100 mg  100 mg Oral Daily Lorretta Harp, MD   100 mg at 06/01/15 1610   Or  . thiamine (B-1) injection 100 mg  100 mg Intravenous Daily Lorretta Harp, MD   100 mg at 05/31/15 1058  . tiotropium (SPIRIVA) inhalation capsule 18 mcg  18 mcg Inhalation Daily Lorretta Harp, MD   18 mcg at 05/31/15 1143     Discharge Medications: Please see discharge summary for a list of discharge medications.  Relevant Imaging Results:  Relevant Lab Results:   Additional Information SSN: [id number]  KIDD, SUZANNA A, LCSW

## 2015-06-02 ENCOUNTER — Inpatient Hospital Stay (HOSPITAL_COMMUNITY): Payer: Commercial Managed Care - HMO

## 2015-06-02 DIAGNOSIS — S2239XA Fracture of one rib, unspecified side, initial encounter for closed fracture: Secondary | ICD-10-CM

## 2015-06-02 DIAGNOSIS — S2231XA Fracture of one rib, right side, initial encounter for closed fracture: Secondary | ICD-10-CM

## 2015-06-02 DIAGNOSIS — R600 Localized edema: Secondary | ICD-10-CM | POA: Diagnosis not present

## 2015-06-02 DIAGNOSIS — H353130 Nonexudative age-related macular degeneration, bilateral, stage unspecified: Secondary | ICD-10-CM | POA: Diagnosis not present

## 2015-06-02 DIAGNOSIS — S0231XA Fracture of orbital floor, right side, initial encounter for closed fracture: Secondary | ICD-10-CM | POA: Diagnosis not present

## 2015-06-02 DIAGNOSIS — S02401A Maxillary fracture, unspecified, initial encounter for closed fracture: Secondary | ICD-10-CM

## 2015-06-02 DIAGNOSIS — S12000A Unspecified displaced fracture of first cervical vertebra, initial encounter for closed fracture: Secondary | ICD-10-CM

## 2015-06-02 DIAGNOSIS — H1131 Conjunctival hemorrhage, right eye: Secondary | ICD-10-CM | POA: Diagnosis not present

## 2015-06-02 DIAGNOSIS — S02401S Maxillary fracture, unspecified, sequela: Secondary | ICD-10-CM

## 2015-06-02 LAB — CBC
HEMATOCRIT: 31.8 % — AB (ref 36.0–46.0)
HEMOGLOBIN: 10.7 g/dL — AB (ref 12.0–15.0)
MCH: 31.2 pg (ref 26.0–34.0)
MCHC: 33.6 g/dL (ref 30.0–36.0)
MCV: 92.7 fL (ref 78.0–100.0)
Platelets: 128 10*3/uL — ABNORMAL LOW (ref 150–400)
RBC: 3.43 MIL/uL — AB (ref 3.87–5.11)
RDW: 13.4 % (ref 11.5–15.5)
WBC: 8.8 10*3/uL (ref 4.0–10.5)

## 2015-06-02 LAB — BASIC METABOLIC PANEL
Anion gap: 9 (ref 5–15)
BUN: 20 mg/dL (ref 6–20)
CHLORIDE: 107 mmol/L (ref 101–111)
CO2: 23 mmol/L (ref 22–32)
Calcium: 8.3 mg/dL — ABNORMAL LOW (ref 8.9–10.3)
Creatinine, Ser: 0.47 mg/dL (ref 0.44–1.00)
GFR calc Af Amer: >60 mL/min (ref >60)
GFR calc non Af Amer: >60 mL/min (ref >60)
Glucose, Bld: 105 mg/dL — ABNORMAL HIGH (ref 65–99)
POTASSIUM: 3.4 mmol/L — AB (ref 3.5–5.1)
SODIUM: 139 mmol/L (ref 135–145)

## 2015-06-02 MED ORDER — LIDOCAINE HCL (PF) 1 % IJ SOLN
30.0000 mL | Freq: Once | INTRAMUSCULAR | Status: AC
  Administered 2015-06-02: 30 mL via INTRADERMAL
  Filled 2015-06-02: qty 30

## 2015-06-02 MED ORDER — HALOPERIDOL LACTATE 5 MG/ML IJ SOLN
2.0000 mg | INTRAMUSCULAR | Status: DC | PRN
  Administered 2015-06-02 – 2015-06-03 (×2): 2 mg via INTRAVENOUS
  Filled 2015-06-02 (×2): qty 1

## 2015-06-02 MED ORDER — DIPHENHYDRAMINE HCL 50 MG/ML IJ SOLN
12.5000 mg | Freq: Once | INTRAMUSCULAR | Status: AC
  Administered 2015-06-02: 12.5 mg via INTRAVENOUS
  Filled 2015-06-02: qty 1

## 2015-06-02 MED ORDER — POLYVINYL ALCOHOL 1.4 % OP SOLN
1.0000 [drp] | OPHTHALMIC | Status: DC | PRN
  Filled 2015-06-02: qty 15

## 2015-06-02 MED ORDER — PREDNISONE 20 MG PO TABS
20.0000 mg | ORAL_TABLET | Freq: Every day | ORAL | Status: DC
  Administered 2015-06-03: 20 mg via ORAL
  Filled 2015-06-02 (×2): qty 1

## 2015-06-02 MED ORDER — QUETIAPINE FUMARATE 25 MG PO TABS
25.0000 mg | ORAL_TABLET | Freq: Every day | ORAL | Status: DC
  Administered 2015-06-02: 25 mg via ORAL
  Filled 2015-06-02: qty 1

## 2015-06-02 MED ORDER — POTASSIUM CHLORIDE CRYS ER 20 MEQ PO TBCR
40.0000 meq | EXTENDED_RELEASE_TABLET | Freq: Once | ORAL | Status: AC
  Administered 2015-06-02: 40 meq via ORAL
  Filled 2015-06-02: qty 2

## 2015-06-02 MED ORDER — QUETIAPINE FUMARATE 25 MG PO TABS: 25.0000 mg | ORAL_TABLET | Freq: Every day | ORAL | Status: DC

## 2015-06-02 MED ORDER — BACITRACIN-POLYMYXIN B 500-10000 UNIT/GM OP OINT
TOPICAL_OINTMENT | Freq: Three times a day (TID) | OPHTHALMIC | Status: DC
  Administered 2015-06-02 – 2015-06-04 (×6): via OPHTHALMIC
  Administered 2015-06-04: 1 via OPHTHALMIC
  Filled 2015-06-02: qty 3.5

## 2015-06-02 NOTE — Progress Notes (Signed)
06/01/15 2258  What Happened  Was fall witnessed? No  Was patient injured? Yes  Patient found on floor  Found by Staff-comment (Cristi Busick)  Stated prior activity to/from bed, chair, or stretcher  Follow Up  MD notified PCP on call Lenny Pastel( Tom Callahan NP)  Time MD notified 2305  Family notified Yes-comment (Kristina Cueller- Daughter)  Time family notified 2340  Additional tests Yes-comment  Simple treatment Dressing (steri strips; gauze;telfa)  Progress note created (see row info) Yes  Adult Fall Risk Assessment  Risk Factor Category (scoring not indicated) High fall risk per protocol (document High fall risk)  Patient's Fall Risk High Fall Risk (>13 points)  Adult Fall Risk Interventions  Required Bundle Interventions *See Row Information* High fall risk - low, moderate, and high requirements implemented  Additional Interventions Use of appropriate toileting equipment (bedpan, BSC, etc.);Room near nurses station;Reorient/diversional activities with confused patients;Individualized elimination schedule;Bed alarm not indicated with the bundle  Fall with Injury Screening  Risk For Fall Injury- See Row Information  Nurse judgement;F  Intervention(s) for 2 or more risk criteria identified Safety Sitter  Vitals  Temp 98 F (36.7 C)  Temp Source Oral  BP (!) 177/82 mmHg  MAP (mmHg) 106  BP Location Left Arm  BP Method Automatic  Patient Position (if appropriate) Lying  Pulse Rate (!) 106  Pulse Rate Source Dinamap  Resp (!) 22  Oxygen Therapy  SpO2 96 %  O2 Device Room Air  Pain Assessment  Pain Assessment No/denies pain  Pain Score 5  Pain Type Acute pain  Pain Location Head (right orbit area)  Pain Orientation Right  Pain Descriptors / Indicators Sore  Patients Stated Pain Goal 3  Pain Intervention(s) RN made aware;Cold applied  PCA/Epidural/Spinal Assessment  Respiratory Pattern Regular  Neurological  Neuro (WDL) X  Level of Consciousness Alert  Orientation Level  Oriented to person;Disoriented to situation;Disoriented to time;Disoriented to place  Cognition Impulsive  Speech Appropriate at baseline (patient has a [place] accent and speaks slowly (like aphasia))  Pupil Assessment  Yes  R Pupil Size (mm) 3  R Pupil Shape Round  R Pupil Reaction Brisk  L Pupil Size (mm) 3  L Pupil Shape Round  L Pupil Reaction Brisk  Motor Function/Sensation Assessment Elbow extension;Elbow flexion;Head;Grip;Motor response  Facial Symmetry Symmetrical  R Hand Grip Present  L Hand Grip Present  R Elbow Extension (Push/Biceps) Present  L Elbow Extension (Push/Biceps) Present  R Elbow Flexion (Pull/Triceps) Present  L Elbow Flexion (Pull/Triceps) Present  RUE Motor Response Purposeful movement  LUE Motor Response Purposeful movement  RLE Motor Response Purposeful movement  LLE Motor Response Purposeful movement  Neuro Symptoms Anxiety  Musculoskeletal  Musculoskeletal (WDL) X  Assistive Device None  Generalized Weakness Yes  Weight Bearing Restrictions No  Integumentary  Integumentary (WDL) X  Wound / Incision (Open or Dehisced) 06/01/15 Laceration Head Right;Anterior Laceration above right eye (deep);laceration below right eye;hematoma r eye  Date First Assessed/Time First Assessed: 06/01/15 2300   Wound Type: Laceration  Location: Head  Location Orientation: Right;Anterior  Wound Description (Comments): Laceration above right eye (deep);laceration below right eye;hematoma r eye  Present o...  Dressing Type Adhesive strips;Gauze (Comment)  Dressing Changed New  Treatment Ice applied

## 2015-06-02 NOTE — Consult Note (Signed)
Reason for Consult: Facial lacerations and fractures  HPI:  Karina Andrews is an 82 y.o. female who was recently admitted following an apparent fall at home with close rib fracture and scapular fracture. The patient apparently crawled out of bed between the side rails and fell to the floor last night. She was found by nursing on the floor with a large laceration over the right orbit.  CT scan head showed left C-1 arch fracture and fractures of the anterior and lateral right maxillary sinus walls. No acute brain injury. CT scan of the cervical spine indicated mildly displaced fracture of the left half of C1 anteriorly and posteriorly without compromise of the central canal. ENT consulted for traetment of her facial trauma.  Past Medical History  Diagnosis Date  . Hypertension   . Depression   . Osteoporosis     (R) hip fx 11/2009 & (L) hip fx 2006  . COPD (chronic obstructive pulmonary disease) (HCC)   . Cervical spondylosis with myelopathy 01/2009    s/p decompression  . Aortic stenosis     moderate on echo 09/2013  . Alcohol abuse     Past Surgical History  Procedure Laterality Date  . Bladder tuck    . Cholecystectomy  1995  . Posterior laminectomy / decompression cervical spine  01/2009    Done by Dr. Danielle Dess  . Right hip  11/2009    ORIF/ Done by Dr. Turner Daniels  . Cataract extraction Right 06/06/13    Family History  Problem Relation Age of Onset  . Heart attack Father     Social History:  reports that she has been smoking Cigarettes.  She has a 26.5 pack-year smoking history. She has never used smokeless tobacco. She reports that she drinks alcohol. She reports that she does not use illicit drugs.  Allergies: No Known Allergies  Prior to Admission medications   Medication Sig Start Date End Date Taking? Authorizing Provider  albuterol (VENTOLIN HFA) 108 (90 Base) MCG/ACT inhaler Inhale 2 puffs into the lungs every 6 (six) hours as needed for wheezing or shortness of breath.  04/20/15  Yes Pincus Sanes, MD  budesonide-formoterol (SYMBICORT) 160-4.5 MCG/ACT inhaler Inhale 2 puffs into the lungs 2 (two) times daily. 03/11/14  Yes Newt Lukes, MD  losartan (COZAAR) 50 MG tablet Take 1 tablet (50 mg total) by mouth daily. 09/30/14  Yes Newt Lukes, MD  rosuvastatin (CRESTOR) 10 MG tablet Take 1 tablet (10 mg total) by mouth daily. 04/13/15  Yes Corwin Levins, MD  tiotropium (SPIRIVA) 18 MCG inhalation capsule Place 18 mcg into inhaler and inhale daily.   Yes Historical Provider, MD  hydrOXYzine (ATARAX/VISTARIL) 50 MG tablet 1/2 - 1 tab by mouth three times per day as needed Patient taking differently: Take 25 mg by mouth every 6 (six) hours as needed for anxiety or itching.  04/30/15   Corwin Levins, MD  ibuprofen (ADVIL,MOTRIN) 400 MG tablet Take 1 tablet (400 mg total) by mouth 4 (four) times daily. 05/30/15   Marily Memos, MD  oxyCODONE-acetaminophen (PERCOCET/ROXICET) 5-325 MG tablet Take 1-2 tablets by mouth every 6 (six) hours as needed for severe pain. 05/30/15   Marily Memos, MD    Medications:  I have reviewed the patient's current medications. Scheduled: . azithromycin  250 mg Oral Daily  . folic acid  1 mg Oral Daily  . ibuprofen  400 mg Oral QID  . ipratropium-albuterol  3 mL Nebulization TID  . lidocaine (PF)  30 mL Intradermal Once  . LORazepam  0-4 mg Intravenous Q12H  . losartan  50 mg Oral Daily  . multivitamin with minerals  1 tablet Oral Daily  . nicotine  21 mg Transdermal Daily  . predniSONE  40 mg Oral Q breakfast  . rosuvastatin  10 mg Oral Daily  . sodium chloride flush  3 mL Intravenous Q12H  . thiamine  100 mg Oral Daily   Or  . thiamine  100 mg Intravenous Daily   ONG:[id number]:acetaminophen **OR** acetaminophen, albuterol, dextromethorphan-guaiFENesin, HYDROcodone-acetaminophen, LORazepam **OR** LORazepam, ondansetron **OR** ondansetron (ZOFRAN) IV   Dg Chest 2 View  06/02/2015  CLINICAL DATA:  Re-evaluate right rib fractures post new  fall tonight in her hospital room. EXAM: CHEST  2 VIEW COMPARISON:  Radiograph yesterday. FINDINGS: The fourth through seventh right lateral rib fractures are unchanged in alignment. No evidence of new fracture since prior exam. No pneumothorax. Cardiomediastinal contours are unchanged. Decreased vascular congestion from prior. Unchanged right lung base atelectasis. No definite pleural effusion. IMPRESSION: 1. Unchanged right rib fractures. 2. Unchanged right basilar atelectasis. 3. Decreased vascular congestion.  No new abnormality is seen. Electronically Signed   By: Rubye OaksMelanie  Ehinger M.D.   On: 06/02/2015 02:41   Ct Head Wo Contrast  06/02/2015  CLINICAL DATA:  Larey SeatFell tonight hitting the right side of her head on the floor. EXAM: CT HEAD WITHOUT CONTRAST TECHNIQUE: Contiguous axial images were obtained from the base of the skull through the vertex without intravenous contrast. COMPARISON:  01/02/2009 FINDINGS: There is no intracranial hemorrhage or extra-axial fluid collection. There is moderate generalized atrophy. There is white matter hypodensity which is chronic and likely due to small vessel disease. No acute brain abnormality is evident. No interval change is evident except for mild worsening of generalized atrophy. There is a right frontal scalp hematoma. There is stranding opacity in the retro coloanal right orbit which is probably hemorrhage. There is mild proptosis on the right. Optic globe appears intact. There are fractures of the left C1 arch and probably also the left occipital condyle. These are incompletely imaged at the bottom of this examination. There is an air-fluid level in the right maxillary sinus. There is air in the soft tissues lateral to the maxillary sinus, and there probably are minimally displaced fractures of the anterior and lateral right maxillary sinus walls. IMPRESSION: 1. Fracture of the left C1 arch and probably also the left occipital condyle, incompletely imaged. Recommend  immobilization and C-spine CT. 2. New proptosis on the right. Stranding opacity behind the right optic globe may represent a retroconal hemorrhage. 3. Fractures of the anterior and lateral right maxillary sinus, mildly displaced. 4. No evidence of acute brain injury. 5. These results were called by telephone at the time of interpretation on 06/02/2015 at 12:27 am to Nurse Randa EvensJoAnne, who verbally acknowledged these results. Electronically Signed   By: Ellery Plunkaniel R Mitchell M.D.   On: 06/02/2015 00:31   Ct Cervical Spine Wo Contrast  06/02/2015  CLINICAL DATA:  Larey SeatFell tonight.  Generalized neck pain. EXAM: CT CERVICAL SPINE WITHOUT CONTRAST TECHNIQUE: Multidetector CT imaging of the cervical spine was performed without intravenous contrast. Multiplanar CT image reconstructions were also generated. COMPARISON:  None. FINDINGS: There are mildly displaced fractures of the left anterior and posterior C1 arch. Nondisplaced fracture through the left occipital condyle is also present. No involvement of the foramen transversarium. The articulations of C1 with the occipital condyles and with C2 remain intact. Odontoid is intact. Remainder of the cervical  vertebrae are intact. There is prior interbody fusion with anterior plate-screw fixation from C4 through C7, solidly ossified and intact. Central canal is widely patent, with no compromise from the C1 fracture. No acute soft tissue abnormality is evident. Lung apices are markedly emphysematous. IMPRESSION: Acute mildly displaced fractures of the left half of C1 anteriorly and posteriorly, without compromise of the central canal. The fracture does not involve the foramen transversarium. Atlantoaxial articulation is intact. The articulations of C1 with the occipital condyles and with C2 are intact. There also is a nondisplaced fracture through the left occipital condyle. These results will be called to the ordering clinician or representative by the Radiologist Assistant, and  communication documented in the PACS or zVision Dashboard. Electronically Signed   By: Ellery Plunk M.D.   On: 06/02/2015 01:35   Dg Chest Port 1 View  06/01/2015  CLINICAL DATA:  82 year old female status post fall with right fourth through seventh rib fractures. Initial encounter. EXAM: PORTABLE CHEST 1 VIEW COMPARISON:  Chest CT 05/31/2015 and earlier. FINDINGS: Portable AP semi upright view at 0448 hours. Increased pulmonary vascularity bilaterally since 05/30/2015. Multilevel right lateral rib fractures again noted. Lower lung volumes. Stable cardiac size and mediastinal contours. No pneumothorax or pleural effusion. Mildly increased right hilar and infrahilar opacity. Calcified aortic atherosclerosis. Cervical ACDF hardware re- demonstrated. IMPRESSION: 1. Lower lung volumes. No pneumothorax or pleural effusion identified. 2. Acute pulmonary vascular congestion/mild interstitial edema. 3. Increased right hilar and lung base opacity, favor atelectasis. Electronically Signed   By: Odessa Fleming M.D.   On: 06/01/2015 07:17   Review of Systems from medical records:  General: no fevers, chills, no changes in body weight, has fatigue HEENT: no blurry vision, hearing changes or sore throat Pulm: has dyspnea, coughing, wheezing CV: has right chest wall pain, no palpitations Abd: no nausea, vomiting, abdominal pain, diarrhea, constipation GU: no dysuria, burning on urination, increased urinary frequency, hematuria  Ext: no leg edema Neuro: no unilateral weakness, numbness, or tingling, no vision change or hearing loss Skin: no rash MSK: No muscle spasm, no deformity, no limitation of range of movement in spin Heme: No easy bruising.  Travel history: No recent long distant travel.  Blood pressure 156/79, pulse 96, temperature 97.4 F (36.3 C), temperature source Axillary, resp. rate 20, height 5\' 4"  (1.626 m), weight 61.689 kg (136 lb), SpO2 95 %.  Physical Exam  Constitutional: She is awake and  responsive. Mildly confused. Head: Normocephalic and atraumatic.  Face: 4cm complex laceration of the right eyebrow with surrounding edema and ecchymosis. Eye: Vision grossly intact. No entrapment. Mouth and nose: Normal mucosa. Ears: Normal auricles and EACs. Neck: Normal range of motion. No lesion. Trachea midline. Cardiovascular: Normal rate and regular rhythm.  Neurological: She is awake and responsive. Mildly confused. Skin: Skin is warm and dry. No rash noted.  Nursing note and vitals reviewed.  Procedure: Repair of 4cm right eyebrow laceration Anesthesia: Local anesthesia with 1% lidocaine Description: The patient is placed supine on the operating table. The right forehead is prepped and draped in a sterile fashion.  After adequate local anesthesia is achieved, the laceration site is carefully debrided.  Soft tissue undermining is performed to release the skin tension. The laceration is closed in layers with interrupted sutures.  The patient tolerated the procedure well.    Assessment/Plan: Right facial trauma s/p fall. Right eyebrow laceration is repaired under local anesthesia. Her nondisplaced maxillary wall fractures will not need any surgical intervention. Pt may follow  up in my office in 1 week for suture removal. Alternatively she may have her sutures removed at the skilled nursing home facility.  Please keep the incision clean and moist. May apply antibiotic ointment TID for 5 days.  Litzy Dicker,SUI W 06/02/2015, 8:26 AM

## 2015-06-02 NOTE — Progress Notes (Addendum)
Consult request received, chart reviewed, discussed with Dr Butler Denmarkizwan Call placed, unable to reach daughter Karina MariscalKristina Andrews at present. Left message, await call back.  Will call in am to arrange family meeting for code status and goals of care discussions.  Karina Treadway md Karina Andrews palliative medicine team [phone] office [phone] pager   Addendum: Received call back from daughter Karina ClockKristina: Plan: Family meeting with patient and daughter to discuss code status and goc scheduled for 4-9 at 0830.  Full note and further recommendations to follow.   Karina Gadsby md

## 2015-06-02 NOTE — Progress Notes (Signed)
[facility] Progress Note   Karina Andrews  NWG:[id number]  DOB: 1930/12/18  DOA: 05/30/2015 PCP: Oliver BarreJames John, MD  Brief narrative: Karina Andrews is a 82 y.o. female with HTN, HLD, COPD, ongong smoking severe AS, alcohol abuse who presents after she tripped and fell onto her sight sided sustaining right rib and scapula fractures.    Subjective: She fell out of bed last night about a min after the staff left the room sustaining a number of injuries-see below. At this time she complains of the c collar being uncomfortable. No c/o pain. She is breathing better and does not have much cough.   Assessment/Plan: Principal Problem: C1 fracture - mildly displaced fractures of the left half of C1 anteriorly and posteriorly, without compromise of the central canal- see CT report below - Neurosurgery, Dr Wynetta Emeryram recommend hard c collar and f/u with him in 4 wks  Right orbital lacerations right maxillary fractures - Dr Suszanne Connerseoh has sutured the lacerations- sutures need to be removed on 4/15- she can be sent to him if no one at the SNF can remove them -keep incision clean and moist- antibiotic ointment TID x 5 days - fractures need no intervention  Right eye injuries - please see extensive opthalmology note (Dr Wynell Balloonhristopher Weaver) - need to call for "increased proptosis, chemosis, or vision changes" - recommends warm compresses to the lids, can use artificial tears as needed for irritation - needs to f/u with primary ophthalmologist in a few months  Closed rib fractures and scapular fracture - from fall on admission - right anterior 4th through 7th ribs and right scapular tip - incentive spirometry, pain control  COPD exacerbation - weaning steroids - Pulse ox 92% on room air. - cont Mucinex DM,Duonebs, Z pak  Smoker - she tells me that she smokes 1ppd- cont Nicotine patch- advised her to stop - given a script for Chantix as outpt but "is still deciding" whether to start it  ETOH  abuse and withdrawal - tells me she drinks 2 glasses of wine a day- cont ETOH withdrawal protocol- add Seroquel at bedtime for agitation- cont sitter to help prevent another fall  Dysarthria - MRI done as outpt recently negative for CVA- Neuro, Dr Vickey Hugerohmeier evaluated her on on 4/5 and feels speech issues are related to breathing issues from Al stenosis  Ao Stenosis - symptom of dyspnea with exertion- Dr Mayford Knifeurner following her and considering TAVR- has referred her to Dr Excell Seltzerooper for an eval  Pulmonary nodules - follows with Dr Sherene SiresWert for COPD-either he or his PCP can cont oupt surveillence    Hyperlipidemia - Crestor    Essential hypertension - Cozaar  Hypokalemia - replace  Anemia/ thrombocytopenia - follow  NOTE: QID Ibuprofen and PRN 2.5 mg of Vicodin for pain  Antibiotics: Anti-infectives    Start     Dose/Rate Route Frequency Ordered Stop   06/01/15 1000  azithromycin (ZITHROMAX) tablet 250 mg     250 mg Oral Daily 05/31/15 0441 06/05/15 0959   05/31/15 1000  azithromycin (ZITHROMAX) tablet 500 mg     500 mg Oral Daily 05/31/15 0441 05/31/15 1057     Code Status:     Code Status Orders        Start     Ordered   05/31/15 0437  Full code   Continuous     05/31/15 0438    Code Status History    Date Active Date Inactive Code Status Order ID Comments User Context  09/16/2013  2:41 PM 09/19/2013  7:17 PM Full Code [id number]  Joseph Art, DO Inpatient     Family Communication: with daughter Sandi Mariscal Disposition Plan: will go to SNF- due to severe AS which is symptomatic, COPD, C1 fx, I have asked for a palliative care consult and plan for palliative care to follow at sNF DVT prophylaxis: Lovenox Consultants: opthalmology, NS, ENT, palliative Procedures:     Objective: Filed Weights   05/31/15 0601  Weight: 61.689 kg (136 lb)    Intake/Output Summary (Last 24 hours) at 06/02/15 1333 Last data filed at 06/01/15 2200  Gross per 24 hour  Intake     483 ml  Output      0 ml  Net    483 ml     Vitals Filed Vitals:   06/02/15 0436 06/02/15 0555 06/02/15 0906 06/02/15 1317  BP: 177/83 156/79  160/82  Pulse: 99 96  96  Temp: 97.7 F (36.5 C) 97.4 F (36.3 C)  97.7 F (36.5 C)  TempSrc: Oral Axillary  Oral  Resp: 18 20  20   Height:      Weight:      SpO2: 97% 95% 96% 95%    Exam:  General:  Pt is alert, restless   HEENT: No icterus, No thrush, oral mucosa moist- right supraorbital laceration, significant ecchymosis and edema of eye  Cardiovascular: regular rate and rhythm, S1/S2 No murmur  Respiratory: rhonchi in right lung field   Abdomen: Soft, +Bowel sounds, non tender, non distended, no guarding  MSK: No cyanosis or clubbing- no pedal edema   Data Reviewed: Basic Metabolic Panel:  Recent Labs Lab 05/31/15 0046 05/31/15 0447 06/01/15 0413 06/02/15 0722  NA 142 140 144 139  K 4.0 4.4 4.2 3.4*  CL 108 107 112* 107  CO2 22 24 24 23   GLUCOSE 113* 119* 139* 105*  BUN 16 16 13 20   CREATININE 0.53 0.58 0.46 0.47  CALCIUM 9.2 9.2 8.7* 8.3*   Liver Function Tests:  Recent Labs Lab 05/31/15 0046  AST 23  ALT 23  ALKPHOS 79  BILITOT 0.2*  PROT 7.3  ALBUMIN 4.1   No results for input(s): LIPASE, AMYLASE in the last 168 hours. No results for input(s): AMMONIA in the last 168 hours. CBC:  Recent Labs Lab 05/31/15 0046 05/31/15 0447 06/01/15 0413 06/02/15 0722  WBC 10.3 8.4 5.4 8.8  NEUTROABS 8.3*  --   --   --   HGB 12.5 12.3 10.7* 10.7*  HCT 38.1 36.7 32.1* 31.8*  MCV 93.8 93.9 93.6 92.7  PLT 164 162 133* 128*   Cardiac Enzymes: No results for input(s): CKTOTAL, CKMB, CKMBINDEX, TROPONINI in the last 168 hours. BNP (last 3 results) No results for input(s): BNP in the last 8760 hours.  ProBNP (last 3 results)  Recent Labs  04/11/15 1731  PROBNP 33.0    CBG:  Recent Labs Lab 05/31/15 0758 06/01/15 0810  GLUCAP 133* 126*    No results found for this or any previous visit  (from the past 240 hour(s)).   Studies: Dg Chest 2 View  06/02/2015  CLINICAL DATA:  Re-evaluate right rib fractures post new fall tonight in her hospital room. EXAM: CHEST  2 VIEW COMPARISON:  Radiograph yesterday. FINDINGS: The fourth through seventh right lateral rib fractures are unchanged in alignment. No evidence of new fracture since prior exam. No pneumothorax. Cardiomediastinal contours are unchanged. Decreased vascular congestion from prior. Unchanged right lung base atelectasis. No definite pleural  effusion. IMPRESSION: 1. Unchanged right rib fractures. 2. Unchanged right basilar atelectasis. 3. Decreased vascular congestion.  No new abnormality is seen. Electronically Signed   By: Rubye Oaks M.D.   On: 06/02/2015 02:41   Ct Head Wo Contrast  06/02/2015  CLINICAL DATA:  Larey Seat tonight hitting the right side of her head on the floor. EXAM: CT HEAD WITHOUT CONTRAST TECHNIQUE: Contiguous axial images were obtained from the base of the skull through the vertex without intravenous contrast. COMPARISON:  01/02/2009 FINDINGS: There is no intracranial hemorrhage or extra-axial fluid collection. There is moderate generalized atrophy. There is white matter hypodensity which is chronic and likely due to small vessel disease. No acute brain abnormality is evident. No interval change is evident except for mild worsening of generalized atrophy. There is a right frontal scalp hematoma. There is stranding opacity in the retro coloanal right orbit which is probably hemorrhage. There is mild proptosis on the right. Optic globe appears intact. There are fractures of the left C1 arch and probably also the left occipital condyle. These are incompletely imaged at the bottom of this examination. There is an air-fluid level in the right maxillary sinus. There is air in the soft tissues lateral to the maxillary sinus, and there probably are minimally displaced fractures of the anterior and lateral right maxillary sinus  walls. IMPRESSION: 1. Fracture of the left C1 arch and probably also the left occipital condyle, incompletely imaged. Recommend immobilization and C-spine CT. 2. New proptosis on the right. Stranding opacity behind the right optic globe may represent a retroconal hemorrhage. 3. Fractures of the anterior and lateral right maxillary sinus, mildly displaced. 4. No evidence of acute brain injury. 5. These results were called by telephone at the time of interpretation on 06/02/2015 at 12:27 am to Nurse Randa Evens, who verbally acknowledged these results. Electronically Signed   By: Ellery Plunk M.D.   On: 06/02/2015 00:31   Ct Cervical Spine Wo Contrast  06/02/2015  CLINICAL DATA:  Larey Seat tonight.  Generalized neck pain. EXAM: CT CERVICAL SPINE WITHOUT CONTRAST TECHNIQUE: Multidetector CT imaging of the cervical spine was performed without intravenous contrast. Multiplanar CT image reconstructions were also generated. COMPARISON:  None. FINDINGS: There are mildly displaced fractures of the left anterior and posterior C1 arch. Nondisplaced fracture through the left occipital condyle is also present. No involvement of the foramen transversarium. The articulations of C1 with the occipital condyles and with C2 remain intact. Odontoid is intact. Remainder of the cervical vertebrae are intact. There is prior interbody fusion with anterior plate-screw fixation from C4 through C7, solidly ossified and intact. Central canal is widely patent, with no compromise from the C1 fracture. No acute soft tissue abnormality is evident. Lung apices are markedly emphysematous. IMPRESSION: Acute mildly displaced fractures of the left half of C1 anteriorly and posteriorly, without compromise of the central canal. The fracture does not involve the foramen transversarium. Atlantoaxial articulation is intact. The articulations of C1 with the occipital condyles and with C2 are intact. There also is a nondisplaced fracture through the left occipital  condyle. These results will be called to the ordering clinician or representative by the Radiologist Assistant, and communication documented in the PACS or zVision Dashboard. Electronically Signed   By: Ellery Plunk M.D.   On: 06/02/2015 01:35   Dg Chest Port 1 View  06/01/2015  CLINICAL DATA:  82 year old female status post fall with right fourth through seventh rib fractures. Initial encounter. EXAM: PORTABLE CHEST 1 VIEW COMPARISON:  Chest  CT 05/31/2015 and earlier. FINDINGS: Portable AP semi upright view at 0448 hours. Increased pulmonary vascularity bilaterally since 05/30/2015. Multilevel right lateral rib fractures again noted. Lower lung volumes. Stable cardiac size and mediastinal contours. No pneumothorax or pleural effusion. Mildly increased right hilar and infrahilar opacity. Calcified aortic atherosclerosis. Cervical ACDF hardware re- demonstrated. IMPRESSION: 1. Lower lung volumes. No pneumothorax or pleural effusion identified. 2. Acute pulmonary vascular congestion/mild interstitial edema. 3. Increased right hilar and lung base opacity, favor atelectasis. Electronically Signed   By: Odessa Fleming M.D.   On: 06/01/2015 07:17    Scheduled Meds:  Scheduled Meds: . azithromycin  250 mg Oral Daily  . folic acid  1 mg Oral Daily  . ibuprofen  400 mg Oral QID  . ipratropium-albuterol  3 mL Nebulization TID  . LORazepam  0-4 mg Intravenous Q12H  . losartan  50 mg Oral Daily  . multivitamin with minerals  1 tablet Oral Daily  . nicotine  21 mg Transdermal Daily  . predniSONE  40 mg Oral Q breakfast  . rosuvastatin  10 mg Oral Daily  . sodium chloride flush  3 mL Intravenous Q12H  . thiamine  100 mg Oral Daily   Or  . thiamine  100 mg Intravenous Daily   Continuous Infusions:   Time spent on care of this patient: 35 min   Ira Busbin, MD 06/02/2015, 1:33 PM  LOS: 1 day   [facility] Office  [phone] Pager - Text Page per [email or web address] If 7PM-7AM, please contact  night-coverage [email or web address]

## 2015-06-02 NOTE — Progress Notes (Signed)
Patient fell in her room earlier tonight.

## 2015-06-02 NOTE — Progress Notes (Signed)
[facility] progress note. Chief complaint. Patient fall History of present illness. This 82 year old female admitted following an apparent fall at home with close rib fracture and scapular fracture. Patient also with COPD exacerbation under treatment. The patient apparently crawled out of bed between the side rails and fell to the floor. She was found by nursing on the floor with a large laceration over the right orbit any smaller laceration right eye lower lid. The patient was immediately sent to CT scan to rule out any fractures or bleeds. Patient was on Lovenox for DVT prophylaxis which I've discontinued. Instead we'll place the patient on SCD for DVT prophylaxis. CT scan head has resulted showing fracture left C-1 arch fracture and possible left occipital condyle fracture imaged incompletely recommend CT scan of the cervical spine. The brain showed no indication of acute hemorrhage. Patient also noted with new proptosis on the right. Stranding opacity behind the right optic lobe may represent a retrocorneal hemorrhage. Fracture of the anterior and lateral right maxillary sinus mildly displaced. No acute brain injury. CT scan of the cervical spine indicated mildly displaced fracture of the left half of C1 anteriorly and posteriorly without compromise of the central canal. The fracture does not involve the forearm in transfer some. The articulations of C1 with the occipital condyles and with C2 are intact. There is also a nondisplaced fracture through the left occipital condyle. Physical exam. Vital signs. Temperature 97.4, pulse 99, respiration 24 blood pressure 177/80. O2 sats 98%. General appearance. Frail elderly female who is alert and in no distress. HEENT. There is a 1.5 inch deep laceration above the right eyebrow. There appears to be a smaller 0.2-0.3 cm laceration to the right lower eyelid. Cardiac. Rate and rhythm regular. 2/6 systolic ejection murmur. Lungs. Breath sounds generally  reduced but clear. Abdomen. Soft with positive bowel sounds. Musculoskeletal. Patient still has pain with palpation over the right ribs area and this is not new and rib fractures were noted on admission per chest CT scan. Range of motion of the upper and lower extremities appears normal and strength equal 4. Neurologic. Cranial nerves II-12 grossly intact. No unilateral or focal defects noted. Patient's speech remains dysarthric which is again not new. Impression/plan. Problem #1. C-1 fracture. This as described above. Discussed the case with Dr. Wynetta Emeryram of neurosurgery. He reviewed the CT scan results and indicated the patient should have a hard collar in place until further notice. Attending physician should make an appointment for neurosurgery follow-up with Dr. Wynetta Emeryram as an outpatient for approximately 4 weeks. Repeat radiology will be done at that time. Problem #2. Right orbital lacerations. I discussed the case with Dr.Teoh of ENT. Right orbital lacerations as described above. Dr. Suszanne Connerseoh has kindly consented to see the patient later this morning and suture the laceration above the right eye and to the right eyelid. Problem #3. Right eye proptosis. Again the CT scan of the head did indicate new right I(with stranding opacity behind the right optic lobe that may represent a retro-clonal hemorrhage. I did discuss the case with ophthalmology indicated these type of bleeds are generally self-limiting. I did discontinue Lovenox DVT prophylactics and replaced with SCD. Ophthalmology has kindly consented to see the patient later this morning in consult. Hard cervical collar is in place and patient appears clinically stable at this time. I have asked for neuro checks every 4 hours for the next 24 hours to monitor patient. I have discussed the findings and treatment plan with the patient and family  at bedside.

## 2015-06-02 NOTE — Consult Note (Signed)
CC:  Chief Complaint  Patient presents with  . Fall    right rib pain    HPI: Karina Andrews is a 82 y.o. female w/ POH of Pseudophakia OU (unknown surgeon and date) and PMH below who presents for evaluation of fall last evening. Apparently, she crawled out of bed last evening and fell to the floor. CT  Scan showed a fracture of the anterior and lateral right maxillary sinus. She also had a laceration over the right eyebrow that was repaired by ENT, Dr. Suszanne Connerseoh.   History is difficult, but she denies vision changes. Ophtho was consulted to rule out concern for ruptured globe.   ROS: Denies fever/chills, unintentional weight loss, chest pain, irregular heart rhythm, SOB, cough, wheezing, abdominal pain, melena, hematochezia, weakness, numbness, slurring of speech, facial droop, muscle weakness, joint pain, skin rash, tattoos, depressed mood  PMH: Past Medical History  Diagnosis Date  . Hypertension   . Depression   . Osteoporosis     (R) hip fx 11/2009 & (L) hip fx 2006  . COPD (chronic obstructive pulmonary disease) (HCC)   . Cervical spondylosis with myelopathy 01/2009    s/p decompression  . Aortic stenosis     moderate on echo 09/2013  . Alcohol abuse     PSH: Past Surgical History  Procedure Laterality Date  . Bladder tuck    . Cholecystectomy  1995  . Posterior laminectomy / decompression cervical spine  01/2009    Done by Dr. Danielle DessElsner  . Right hip  11/2009    ORIF/ Done by Dr. Turner Danielsowan  . Cataract extraction Right 06/06/13    Meds: No current facility-administered medications on file prior to encounter.   Current Outpatient Prescriptions on File Prior to Encounter  Medication Sig Dispense Refill  . albuterol (VENTOLIN HFA) 108 (90 Base) MCG/ACT inhaler Inhale 2 puffs into the lungs every 6 (six) hours as needed for wheezing or shortness of breath. 18 g 0  . budesonide-formoterol (SYMBICORT) 160-4.5 MCG/ACT inhaler Inhale 2 puffs into the lungs 2 (two) times daily. 10.2  g 11  . losartan (COZAAR) 50 MG tablet Take 1 tablet (50 mg total) by mouth daily. 90 tablet 3  . rosuvastatin (CRESTOR) 10 MG tablet Take 1 tablet (10 mg total) by mouth daily. 90 tablet 3  . hydrOXYzine (ATARAX/VISTARIL) 50 MG tablet 1/2 - 1 tab by mouth three times per day as needed (Patient taking differently: Take 25 mg by mouth every 6 (six) hours as needed for anxiety or itching. ) 60 tablet 1    SH: Social History   Social History  . Marital Status: Single    Spouse Name: N/A  . Number of Children: N/A  . Years of Education: N/A   Social History Main Topics  . Smoking status: Current Every Day Smoker -- 0.50 packs/day for 53 years    Types: Cigarettes  . Smokeless tobacco: Never Used  . Alcohol Use: 0.0 oz/week    0 Standard drinks or equivalent per week  . Drug Use: No  . Sexual Activity: Not on file   Other Topics Concern  . Not on file   Social History Narrative    FH: Family History  Problem Relation Age of Onset  . Heart attack Father     Exam:  Zenaida NieceVan: OD: 4-5 pt (+2.75) - difficult OS: 4-5 pt (+2.75) - difficult  CVF: OD: full OS: full  EOM: OD: full d/v OS: full d/v  Pupils: OD: 3.5->2 mm, no APD  OS: 3.5->2 mm, no APD  IOP: by [place] OD: 23 OS: 15  External: OD: Supraciliary laceration repaired with interrupted sutures, 2+ periorbital ecchymosis, 1+ periorbital edema, minimal proptosis OS: no periorbital edema, no proptosis, V1-V3 intact and symmetric, good orbicularis strength  PF: 0/8  Pen Light Exam: L/L: OD: 1+ edema, no lid margin lacerations, 1+ ecchymosis OS: WNL  C/S: OD: Temporal subconjunctival hemorrhage OS: white and quiet  K: OD: clear, no abnormal staining OS: clear, no abnormal staining  A/C: OD: grossly deep and quiet appearing by pen light OS: grossly deep and quiet appearing by pen light  I: OD: round and regular OS: round and regular  L: OD: PCIOL OS: PCIOL  DFE: dilated @ 9:02 w/ Tropic and  Phenyl OU  V: OD: Old white fluffy deposits inferonasally - likely old vit heme OS: clear  N: OD: C/D 0.4, no disc edema OS: C/D 0.4, no disc edema  M: OD: RPE changes with drusen; no heme OS: RPE changes with drusen, no heme V: OD: Mild attenuation OS: Mild attenuation  P: OD: retina flat 360, no obvious mass/RT/RD OS: retina flat 360, no obvious mass/RT/RD  A/P:  1. Periorbital Edema and Ecchymosis OD s/p fall - CT Head showed concern for proptosis and retrobulbar hemorrhage - images reviewed, likely small retrobulbar hemorrhage - Laceration repaired by ENT - IOP slightly elevated compared to OD, but no evidence or concern for compartment syndrome - minimal proptosis - No optic nerve compromise evident on examination - Retrobulbar heme will likely resolve with time - no indication for canthotomy/cantholysis given absence of compartment syndrome - Please call with increased proptosis, chemosis, or vision changes - No evidence of open globe or globe damage - Defer maxillary fractures to ENT - Recommend warm compresses to the lids, can use artificial tears as needed for irritation  2. Pseudophakia OU - Stable - F/up with primary ophthalmologist in the few months following discharge  3. Subconjunctival Hemorrhage OD - Artifical tears as needed for irritation - Will resolve in 2-3 weeks  4. Old Vit Heme OD - Layered inferonasally - No tear/detachment on examination, no active hemorrhage - Recommend follow-up with repeat dilated examination on follow-up  5. Dry ARMD OU - Stable - Recommend AREDS vitamins  Please call with any questions.   Wynell Balloon, MD,[facility] Ophthalmology

## 2015-06-03 ENCOUNTER — Encounter (HOSPITAL_COMMUNITY): Payer: Self-pay | Admitting: *Deleted

## 2015-06-03 DIAGNOSIS — Z720 Tobacco use: Secondary | ICD-10-CM | POA: Diagnosis not present

## 2015-06-03 DIAGNOSIS — R69 Illness, unspecified: Secondary | ICD-10-CM | POA: Diagnosis not present

## 2015-06-03 DIAGNOSIS — I350 Nonrheumatic aortic (valve) stenosis: Secondary | ICD-10-CM | POA: Diagnosis not present

## 2015-06-03 DIAGNOSIS — Z515 Encounter for palliative care: Secondary | ICD-10-CM | POA: Insufficient documentation

## 2015-06-03 DIAGNOSIS — S12000A Unspecified displaced fracture of first cervical vertebra, initial encounter for closed fracture: Secondary | ICD-10-CM | POA: Diagnosis not present

## 2015-06-03 DIAGNOSIS — Z7189 Other specified counseling: Secondary | ICD-10-CM

## 2015-06-03 MED ORDER — SODIUM CHLORIDE 0.9 % IV BOLUS (SEPSIS): 1000.0000 mL | Freq: Once | INTRAVENOUS | Status: DC

## 2015-06-03 MED ORDER — HYDROXYZINE HCL 50 MG PO TABS
50.0000 mg | ORAL_TABLET | Freq: Once | ORAL | Status: AC
  Administered 2015-06-03: 50 mg via ORAL
  Filled 2015-06-03: qty 1

## 2015-06-03 MED ORDER — LORAZEPAM 2 MG/ML IJ SOLN: 0.0000 mg | INTRAMUSCULAR | Status: DC | PRN

## 2015-06-03 MED ORDER — QUETIAPINE FUMARATE 25 MG PO TABS
50.0000 mg | ORAL_TABLET | Freq: Once | ORAL | Status: AC
  Administered 2015-06-03: 50 mg via ORAL
  Filled 2015-06-03: qty 2

## 2015-06-03 MED ORDER — HYDROCODONE-ACETAMINOPHEN 5-325 MG PO TABS
1.0000 | ORAL_TABLET | ORAL | Status: DC | PRN
  Administered 2015-06-03 – 2015-06-04 (×2): 2 via ORAL
  Filled 2015-06-03 (×2): qty 2

## 2015-06-03 MED ORDER — MORPHINE SULFATE (PF) 2 MG/ML IV SOLN
2.0000 mg | INTRAVENOUS | Status: DC | PRN
  Administered 2015-06-03 – 2015-06-04 (×2): 2 mg via INTRAVENOUS
  Filled 2015-06-03 (×2): qty 1

## 2015-06-03 MED ORDER — SODIUM CHLORIDE 0.9 % IV SOLN
INTRAVENOUS | Status: DC
  Administered 2015-06-03 – 2015-06-04 (×4): via INTRAVENOUS

## 2015-06-03 MED ORDER — SODIUM CHLORIDE 0.9 % IV BOLUS (SEPSIS)
250.0000 mL | Freq: Once | INTRAVENOUS | Status: AC
  Administered 2015-06-03: 250 mL via INTRAVENOUS

## 2015-06-03 NOTE — Progress Notes (Signed)
[facility] Progress Note   Karina Andrews Pless  JXB:[id number]  DOB: 1930/03/19  DOA: 05/30/2015 PCP: Oliver BarreJames John, MD  Brief narrative: Karina Andrews Karina Andrews is a [age] y.o. female with HTN, HLD, COPD, ongong smoking severe AS, alcohol abuse who presents after she tripped and fell onto her sight sided sustaining right rib and scapula fractures.    Subjective: Was agitated through out the night and still has not rested. Having pain - points to left rib cage.   Assessment/Plan: Principal Problem: As a result of fall on 4/7:  (A) C1 fracture - mildly displaced fractures of the left half of C1 anteriorly and posteriorly, without compromise of the central canal- see CT report below - Neurosurgery, Dr Wynetta Emeryram recommend hard c collar and f/u with him in 4 wks  (B) Right orbital lacerations right maxillary fractures - Dr Suszanne Connerseoh has sutured the lacerations- sutures need to be removed on 4/15- she can be sent to him if no one at the SNF can remove them -keep incision clean and moist- antibiotic ointment TID x 5 days - fractures need no intervention  (C) Right eye injuries - please see extensive opthalmology note (Dr Wynell Balloonhristopher Weaver) - need to call for "increased proptosis, chemosis, or vision changes" - recommends warm compresses to the lids, can use artificial tears as needed for irritation - needs to f/u with primary ophthalmologist in a few months  Closed rib fractures and scapular fracture - from fall on admission - right anterior 4th through 7th ribs and right scapular tip - incentive spirometry, pain control  Dehydration - poor PO intake yesterday- resume IVF today  COPD exacerbation - weaning steroids   - cont Mucinex DM, Duonebs, Z pak  Smoker - she tells me that she smokes 1ppd- cont Nicotine patch- advised her to stop - given a script for Chantix as outpt but "is still deciding" whether to start it  ETOH abuse and withdrawal - tells me she drinks 2 glasses of wine a day-   - confusion intensifying over the past couple of days - cont sitter to help prevent another fall -continue Ativan - ETOH withdrawal protocol/ control pain - given Seroquel and Morphine this AM which has allowed her to finally rest  Dysarthria - MRI done as outpt recently negative for CVA- Neuro, Dr Dohmeier evaluated her on on 4/5 and feels speech issues are related to breathing issues from Al stenosis  Ao Stenosis - symptom of dyspnea with exertion- Dr Mayford Knifeurner following her and considering TAVR- has referred her to Dr Excell Seltzerooper for an eval- her daughter tells me that the patient did not want any procedures  Pulmonary nodules - follows with Dr Sherene SiresWert for COPD-either he or his PCP can cont oupt surveillance as warrented    Hyperlipidemia - Crestor    Essential hypertension - Cozaar held today  Hypokalemia - replaced  Anemia/ thrombocytopenia - follow     Antibiotics: Anti-infectives    Start     Dose/Rate Route Frequency Ordered Stop   06/01/15 1000  azithromycin (ZITHROMAX) tablet 250 mg     250 mg Oral Daily 05/31/15 0441 06/05/15 0959   05/31/15 1000  azithromycin (ZITHROMAX) tablet 500 mg     500 mg Oral Daily 05/31/15 0441 05/31/15 1057     Code Status: DNR now after by myself and then Dr Linna DarnerAnwar Family Communication: with daughter Sandi MariscalKristina Cuellar Disposition Plan: will go to SNF- due to severe AS which is symptomatic, COPD, C1 fx, I have asked for a  palliative care consult - appreciate their help- plan for palliative care to follow at SNF DVT prophylaxis: Lovenox Consultants: opthalmology, NS, ENT, palliative Procedures:     Objective: Filed Weights   05/31/15 0601  Weight: 61.689 kg (136 lb)    Intake/Output Summary (Last 24 hours) at 06/03/15 1447 Last data filed at 06/03/15 0444  Gross per 24 hour  Intake    600 ml  Output      0 ml  Net    600 ml     Vitals Filed Vitals:   06/02/15 1357 06/02/15 2104 06/03/15 0435 06/03/15 1326  BP:  151/75 151/83    Pulse:  132 104   Temp:  97.9 F (36.6 C) 97.9 F (36.6 C)   TempSrc:  Oral Oral   Resp:  24 22   Height:      Weight:      SpO2: 96% 96% 91% 91%    Exam:  General:  Pt is alert, restless   HEENT: No icterus, No thrush, oral mucosa moist- right supraorbital laceration, significant ecchymosis and edema of eye  Cardiovascular: regular rate and rhythm, S1/S2 No murmur  Respiratory: rhonchi bilaterally  Abdomen: Soft, +Bowel sounds, non tender, non distended, no guarding  MSK: No cyanosis or clubbing- no pedal edema   Data Reviewed: Basic Metabolic Panel:  Recent Labs Lab 05/31/15 0046 05/31/15 0447 06/01/15 0413 06/02/15 0722  NA 142 140 144 139  K 4.0 4.4 4.2 3.4*  CL 108 107 112* 107  CO2 GLUCOSE 113* 119* 139* 105*  BUN CREATININE 0.53 0.58 0.46 0.47  CALCIUM 9.2 9.2 8.7* 8.3*   Liver Function Tests:  Recent Labs Lab 05/31/15 0046  AST 23  ALT 23  ALKPHOS 79  BILITOT 0.2*  PROT 7.3  ALBUMIN 4.1   No results for input(s): LIPASE, AMYLASE in the last 168 hours. No results for input(s): AMMONIA in the last 168 hours. CBC:  Recent Labs Lab 05/31/15 0046 05/31/15 0447 06/01/15 0413 06/02/15 0722  WBC 10.3 8.4 5.4 8.8  NEUTROABS 8.3*  --   --   --   HGB 12.5 12.3 10.7* 10.7*  HCT 38.1 36.7 32.1* 31.8*  MCV 93.8 93.9 93.6 92.7  PLT 164 162 133* 128*   Cardiac Enzymes: No results for input(s): CKTOTAL, CKMB, CKMBINDEX, TROPONINI in the last 168 hours. BNP (last 3 results) No results for input(s): BNP in the last 8760 hours.  ProBNP (last 3 results)  Recent Labs  04/11/15 1731  PROBNP 33.0    CBG:  Recent Labs Lab 05/31/15 0758 06/01/15 0810  GLUCAP 133* 126*    No results found for this or any previous visit (from the past 240 hour(s)).   Studies: Dg Chest 2 View  06/02/2015  CLINICAL DATA:  Re-evaluate right rib fractures post new fall tonight in her hospital room. EXAM: CHEST  2 VIEW COMPARISON:   Radiograph yesterday. FINDINGS: The fourth through seventh right lateral rib fractures are unchanged in alignment. No evidence of new fracture since prior exam. No pneumothorax. Cardiomediastinal contours are unchanged. Decreased vascular congestion from prior. Unchanged right lung base atelectasis. No definite pleural effusion. IMPRESSION: 1. Unchanged right rib fractures. 2. Unchanged right basilar atelectasis. 3. Decreased vascular congestion.  No new abnormality is seen. Electronically Signed   By: Rubye Oaks M.D.   On: 06/02/2015 02:41   Ct Head Wo Contrast  06/02/2015  CLINICAL DATA:  Larey Seat tonight hitting the  right side of her head on the floor. EXAM: CT HEAD WITHOUT CONTRAST TECHNIQUE: Contiguous axial images were obtained from the base of the skull through the vertex without intravenous contrast. COMPARISON:  01/02/2009 FINDINGS: There is no intracranial hemorrhage or extra-axial fluid collection. There is moderate generalized atrophy. There is white matter hypodensity which is chronic and likely due to small vessel disease. No acute brain abnormality is evident. No interval change is evident except for mild worsening of generalized atrophy. There is a right frontal scalp hematoma. There is stranding opacity in the retro coloanal right orbit which is probably hemorrhage. There is mild proptosis on the right. Optic globe appears intact. There are fractures of the left C1 arch and probably also the left occipital condyle. These are incompletely imaged at the bottom of this examination. There is an air-fluid level in the right maxillary sinus. There is air in the soft tissues lateral to the maxillary sinus, and there probably are minimally displaced fractures of the anterior and lateral right maxillary sinus walls. IMPRESSION: 1. Fracture of the left C1 arch and probably also the left occipital condyle, incompletely imaged. Recommend immobilization and C-spine CT. 2. New proptosis on the right.  Stranding opacity behind the right optic globe may represent a retroconal hemorrhage. 3. Fractures of the anterior and lateral right maxillary sinus, mildly displaced. 4. No evidence of acute brain injury. 5. These results were called by telephone at the time of interpretation on 06/02/2015 at 12:27 am to Nurse Randa Evens, who verbally acknowledged these results. Electronically Signed   By: Ellery Plunk M.D.   On: 06/02/2015 00:31   Ct Cervical Spine Wo Contrast  06/02/2015  CLINICAL DATA:  Larey Seat tonight.  Generalized neck pain. EXAM: CT CERVICAL SPINE WITHOUT CONTRAST TECHNIQUE: Multidetector CT imaging of the cervical spine was performed without intravenous contrast. Multiplanar CT image reconstructions were also generated. COMPARISON:  None. FINDINGS: There are mildly displaced fractures of the left anterior and posterior C1 arch. Nondisplaced fracture through the left occipital condyle is also present. No involvement of the foramen transversarium. The articulations of C1 with the occipital condyles and with C2 remain intact. Odontoid is intact. Remainder of the cervical vertebrae are intact. There is prior interbody fusion with anterior plate-screw fixation from C4 through C7, solidly ossified and intact. Central canal is widely patent, with no compromise from the C1 fracture. No acute soft tissue abnormality is evident. Lung apices are markedly emphysematous. IMPRESSION: Acute mildly displaced fractures of the left half of C1 anteriorly and posteriorly, without compromise of the central canal. The fracture does not involve the foramen transversarium. Atlantoaxial articulation is intact. The articulations of C1 with the occipital condyles and with C2 are intact. There also is a nondisplaced fracture through the left occipital condyle. These results will be called to the ordering clinician or representative by the Radiologist Assistant, and communication documented in the PACS or zVision Dashboard. Electronically  Signed   By: Ellery Plunk M.D.   On: 06/02/2015 01:35    Scheduled Meds:  Scheduled Meds: . azithromycin  250 mg Oral Daily  . bacitracin-polymyxin b   Right Eye TID  . folic acid  1 mg Oral Daily  . hydrOXYzine  50 mg Oral Once  . ibuprofen  400 mg Oral QID  . ipratropium-albuterol  3 mL Nebulization TID  . multivitamin with minerals  1 tablet Oral Daily  . nicotine  21 mg Transdermal Daily  . predniSONE  20 mg Oral Q breakfast  . rosuvastatin  10 mg Oral Daily  . sodium chloride flush  3 mL Intravenous Q12H  . thiamine  100 mg Oral Daily   Or  . thiamine  100 mg Intravenous Daily   Continuous Infusions: . sodium chloride 125 mL/hr at 06/03/15 0959    Time spent on care of this patient: 35 min   Ameir Faria, MD 06/03/2015, 2:47 PM  LOS: 2 days   [facility] Office  [phone] Pager - Text Page per [email or web address] If 7PM-7AM, please contact night-coverage [email or web address]

## 2015-06-03 NOTE — Progress Notes (Addendum)
Pt is resting well after prn and seroquel given with morning meds, will arouse and respond but has not fully awakened, PO meds not given in afternoon/evening to allow pt to continue to rest after having increased agitation for over 12 hours, MD aware

## 2015-06-03 NOTE — Consult Note (Signed)
Consultation Note Date: 06/03/2015   Patient Name: Karina Andrews  DOB: 1930/06/07  MRN: [id number]  Age / Sex: 82 y.o., female  PCP: Biagio Borg, MD Referring Physician: Debbe Odea, MD  Reason for Consultation: Establishing goals of care    Clinical Assessment/Narrative:  Patient is a 82 year old lady with a past medical history significant for hypertension, dyslipidemia, chronic obstructive pulmonary disease, severe aortic stenosis. Patient has a history of cigarette smoking as well as alcohol use. Patient was initially admitted because of fall and right sided rib and scapular fractures. During her hospitalization in a.m. 06-02-15 patient fell out of her hospital bed. She sustained facial trauma to her face, right orbit in addition to C1 fracture. She has been seen and evaluated by neurosurgery, ophthalmology, ENT. Patient is currently in a hard collar and is with a safety sitter at the bedside. She appears weak and restless. She appears in mild-moderate distress. She appears with possibly pain. She has not rested well overnight. Oral intake is reasonable. Palliative care consult for goals of care discussions.  Met with the patient's daughter healthcare power of attorney agent Karina Andrews this a.m. by the patient's bedside. [name] states that the patient has had gradual progressive decline for the last several months now. In fact, [name] had began gathering initial information on hospice services. She had asked for the patient to be moved to a friend's home and is in contact with case managers at friend's home. She has a living will she has a healthcare power of attorney agent paperwork designating [name] as her POA. She has a son who lives in [place]. She was divorced from her husband. She is originally from [place]. Brief life review performed. Patient's daughter states that the patient has had a hard life.  Ultimately, she started smoking heavily and drinking heavily. She used to take an antianxiety medicine for sleep for years. Gradually she has started having more falls and more weakness.  Patient's daughter recalls that the patient came back from cardiology evaluation and had told her that she would not want to undergo transaortic valve replacement surgery. She has continued to smoke and drink. She has had ongoing decline and frailty leading up to this hospitalization.  CODE STATUS and goals of care discussions undertaken. Patient has a CODE STATUS of DO NOT RESUSCITATE/DO NOT INTUBATE. Additionally, patient's daughter wonders whether she will be able to withstand a rehabilitation attempt. Patient is not tolerating her hard collar and wants it off. Discussed about adequate symptom management of pain and restlessness/agitation. Agreed with the patient's daughter that a rehabilitation attempt might be too forceful on the patient is overall condition. Discussed about more gentle/comfort measures. Daughter is in agreement. Discussed about adding hospice as an extra layer of support. Daughter is agreeable. Discussed frankly that it is difficult to prognosticate how much time the patient actually has. All questions answered to the best of my ability.  Contacts/Participants in Discussion: Primary Decision Maker:     Relationship to Patient   HCPOA: yes     SUMMARY OF RECOMMENDATIONS: CODE STATUS is now established as DO NOT RESUSCITATE/DO NOT INTUBATE Hospice consultation, likely will need skilled level of care or assisted living level of care with hospice support on discharge Continue to augment medications for pain and restlessness/agitation.  Code Status/Advance Care Planning: DNR    Code Status Orders        Start     Ordered   06/03/15 7829  Do not attempt resuscitation (DNR)   Continuous  Question Answer Comment  In the event of cardiac or respiratory ARREST Do not call a "code blue"     In the event of cardiac or respiratory ARREST Do not perform Intubation, CPR, defibrillation or ACLS   In the event of cardiac or respiratory ARREST Use medication by any route, position, wound care, and other measures to relive pain and suffering. May use oxygen, suction and manual treatment of airway obstruction as needed for comfort.      06/03/15 1452    Code Status History    Date Active Date Inactive Code Status Order ID Comments User Context   05/31/2015  4:38 AM 06/03/2015  2:52 PM Full Code [id number]  Ivor Costa, MD ED   09/16/2013  2:41 PM 09/19/2013  7:17 PM Full Code [id number]  Geradine Girt, DO Inpatient      Other Directives:Advanced Directive  Symptom Management:    Discussed with Dr. Wynelle Cleveland. Continue current measures  Palliative Prophylaxis:   Delirium Protocol   Psycho-social/Spiritual:  Support System: [place] Desire for further Chaplaincy support:no Additional Recommendations: Caregiving  Support/Resources  Prognosis: < 3 months  Discharge Planning: [place] with Hospice   Chief Complaint/ Primary Diagnoses: Present on Admission:  . Senile osteoporosis . NICOTINE ADDICTION . Hyperlipidemia . Essential hypertension . COPD exacerbation ([place]) . Depression . DEGENERATIVE DISC DISEASE, CERVICAL SPINE, WITH MYELOPATHY . Cigarette smoker . Fall . Closed rib fracture . Alcohol abuse  I have reviewed the medical record, interviewed the patient and family, and examined the patient. The following aspects are pertinent.  Past Medical History  Diagnosis Date  . Hypertension   . Depression   . Osteoporosis     (R) hip fx 11/2009 & (L) hip fx 2006  . COPD (chronic obstructive pulmonary disease) ([place])   . Cervical spondylosis with myelopathy 01/2009    s/p decompression  . Aortic stenosis     moderate on echo 09/2013  . Alcohol abuse    Social History   Social History  . Marital Status: Single    Spouse Name: N/A  . Number of Children: N/A   . Years of Education: N/A   Social History Main Topics  . Smoking status: Current Every Day Smoker -- 0.50 packs/day for 53 years    Types: Cigarettes  . Smokeless tobacco: Never Used  . Alcohol Use: 0.0 oz/week    0 Standard drinks or equivalent per week  . Drug Use: No  . Sexual Activity: Not on file   Other Topics Concern  . Not on file   Social History Narrative   Family History  Problem Relation Age of Onset  . Heart attack Father    Scheduled Meds: . azithromycin  250 mg Oral Daily  . bacitracin-polymyxin b   Right Eye TID  . folic acid  1 mg Oral Daily  . hydrOXYzine  50 mg Oral Once  . ibuprofen  400 mg Oral QID  . ipratropium-albuterol  3 mL Nebulization TID  . multivitamin with minerals  1 tablet Oral Daily  . nicotine  21 mg Transdermal Daily  . predniSONE  20 mg Oral Q breakfast  . rosuvastatin  10 mg Oral Daily  . sodium chloride flush  3 mL Intravenous Q12H  . thiamine  100 mg Oral Daily   Or  . thiamine  100 mg Intravenous Daily   Continuous Infusions: . sodium chloride 125 mL/hr at 06/03/15 0959   PRN Meds:.acetaminophen **OR** acetaminophen, albuterol, haloperidol lactate, HYDROcodone-acetaminophen, [EXPIRED] LORazepam **  FOLLOWED BY** LORazepam, morphine injection, ondansetron **OR** ondansetron (ZOFRAN) IV, polyvinyl alcohol Medications Prior to Admission:  Prior to Admission medications   Medication Sig Start Date End Date Taking? Authorizing Provider  albuterol (VENTOLIN HFA) 108 (90 Base) MCG/ACT inhaler Inhale 2 puffs into the lungs every 6 (six) hours as needed for wheezing or shortness of breath. 04/20/15  Yes Binnie Rail, MD  budesonide-formoterol (SYMBICORT) 160-4.5 MCG/ACT inhaler Inhale 2 puffs into the lungs 2 (two) times daily. 03/11/14  Yes Rowe Clack, MD  losartan (COZAAR) 50 MG tablet Take 1 tablet (50 mg total) by mouth daily. 09/30/14  Yes Rowe Clack, MD  rosuvastatin (CRESTOR) 10 MG tablet Take 1 tablet (10 mg total) by  mouth daily. 04/13/15  Yes Biagio Borg, MD  tiotropium (SPIRIVA) 18 MCG inhalation capsule Place 18 mcg into inhaler and inhale daily.   Yes Historical Provider, MD  hydrOXYzine (ATARAX/VISTARIL) 50 MG tablet 1/2 - 1 tab by mouth three times per day as needed Patient taking differently: Take 25 mg by mouth every 6 (six) hours as needed for anxiety or itching.  04/30/15   Biagio Borg, MD  ibuprofen (ADVIL,MOTRIN) 400 MG tablet Take 1 tablet (400 mg total) by mouth 4 (four) times daily. 05/30/15   Merrily Pew, MD  oxyCODONE-acetaminophen (PERCOCET/ROXICET) 5-325 MG tablet Take 1-2 tablets by mouth every 6 (six) hours as needed for severe pain. 05/30/15   Merrily Pew, MD   No Known Allergies  Review of Systems Restless, appears in pain, does not verbalize much Physical Exam Weak elderly lady bruises on face, eye, wearing a hard collar Frail cachectic S1-S2 Shallow clear breathing anteriorly Abdomen soft No edema Patient opens eyes when her name is called otherwise is not able to verbalize much of provide history.  Vital Signs: BP 151/83 mmHg  Pulse 104  Temp(Src) 97.9 F (36.6 C) (Oral)  Resp 22  Ht _0  (1.626 m)  Wt 61.689 kg (136 lb)  BMI 23.33 kg/m2  SpO2 91%  SpO2: SpO2: 91 % O2 Device:SpO2: 91 % O2 Flow Rate: .O2 Flow Rate (L/min): 2 L/min  IO: Intake/output summary:  Intake/Output Summary (Last 24 hours) at 06/03/15 1455 Last data filed at 06/03/15 0444  Gross per 24 hour  Intake    600 ml  Output      0 ml  Net    600 ml    LBM: Last BM Date: 05/30/15 Baseline Weight: Weight: 61.689 kg (136 lb) Most recent weight: Weight: 61.689 kg (136 lb)      Palliative Assessment/Data:  Flowsheet Rows        Most Recent Value   Intake Tab    Referral Department  Hospitalist   Unit at Time of Referral  Med/Surg Unit   Palliative Care Primary Diagnosis  Other (Comment)   Palliative Care Type  New Palliative care   Reason for referral  Clarify Goals of Care   Date first  seen by Palliative Care  06/03/15   Clinical Assessment    Palliative Performance Scale Score  30%   Pain Max last 24 hours  6   Pain Min Last 24 hours  5   Dyspnea Max Last 24 Hours  6   Dyspnea Min Last 24 hours  5   Psychosocial & Spiritual Assessment    Palliative Care Outcomes    Patient/Family meeting held?  Yes   Who was at the meeting?  daughter    [place]  goals of care, Counseled regarding hospice   Palliative Care follow-up planned  Yes, Facility      Additional Data Reviewed:  CBC:    Component Value Date/Time   WBC 8.8 06/02/2015 0722   HGB 10.7* 06/02/2015 0722   HCT 31.8* 06/02/2015 0722   PLT 128* 06/02/2015 0722   MCV 92.7 06/02/2015 0722   NEUTROABS 8.3* 05/31/2015 0046   LYMPHSABS 1.0 05/31/2015 0046   MONOABS 0.9 05/31/2015 0046   EOSABS 0.1 05/31/2015 0046   BASOSABS 0.0 05/31/2015 0046   Comprehensive Metabolic Panel:    Component Value Date/Time   NA 139 06/02/2015 0722   K 3.4* 06/02/2015 0722   CL 107 06/02/2015 0722   CO2 23 06/02/2015 0722   BUN 20 06/02/2015 0722   CREATININE 0.47 06/02/2015 0722   GLUCOSE 105* 06/02/2015 0722   CALCIUM 8.3* 06/02/2015 0722   AST 23 05/31/2015 0046   ALT 23 05/31/2015 0046   ALKPHOS 79 05/31/2015 0046   BILITOT 0.2* 05/31/2015 0046   PROT 7.3 05/31/2015 0046   ALBUMIN 4.1 05/31/2015 0046     Time In:  830 Time Out:  930 Time Total:  60 min  Greater than 50%  of this time was spent counseling and coordinating care related to the above assessment and plan.  Signed by: Loistine Chance, MD [id number] Loistine Chance, MD  06/03/2015, 2:55 PM  Please contact Palliative Medicine Team phone at [phone] for questions and concerns.

## 2015-06-03 NOTE — Progress Notes (Signed)
CSW met with patient's daughter Kristina today for very lengthy discussion re: d/c options as she indicated that she had been in contact with [facility] for possible ALF services for her mother (prior to her fall and worsening condition).  CSW noted that patient currently has a safety sitter and was moaning throughout the morning. Nursing in attendance to monitor patient.  Discussed at length- SNF  [facility], [employer] covered services, Hospice, Palliative care and home health PT.  After talking with a family friend- daughter would like to take patient home with hospice services and will plan to hire 24 hour caregiver in the home.  She states that her mother is very "strong willed" and feels that she will do better at home. Daughter has already been in contact with a private care agency through [facility] (patient is not a patient there) and will contact them on Monday to begin to arrange for private care services at home. She does not feel that a hospital bed is indicated at this time; patient already has a BSC and walker at home. Discussed that if patient were to drastically improve- she could be changed to home health services- however daughter states that per Dr. Anwar and Dr. Rizwan- her mother's condition will probably deteriorate with time.  She has already decided to be a DNR and daughter wants her mother to be made as comfortable as possible.  Notified weekend RNCM Alesia of above and she will forward information to weekday RNCM.  Patient may require EMS transport home based on current condition.  No further CSW needs identified.. CSW signing off.   T. , LCSW [phone] (weekend coverage)   

## 2015-06-03 NOTE — Care Management Note (Addendum)
Case Management Note  Patient Details  Name: Karina Andrews MRN: [id number] Date of Birth: 14-Sep-1930  Subjective/Objective:    Multiple falls, COPD                Action/Plan: Discharge Planning: CSW referral for Hospice. Family has decided to dc home with Hospice. NCM will follow up with family on 06/04/2015 to arrange Home Hospice.    Expected Discharge Date:                  Expected Discharge Plan:  Home w Hospice Care  In-House Referral:  Clinical Social Work  Discharge planning Services  CM Consult   Status of Service:  In process, will continue to follow  Medicare Important Message Given:    Date Medicare IM Given:    Medicare IM give by:    Date Additional Medicare IM Given:    Additional Medicare Important Message give by:     If discussed at Long Length of Stay Meetings, dates discussed:    Additional Comments:  Elliot CousinShavis, Ellysa Parrack Ellen, RN 06/03/2015, 6:00 PM

## 2015-06-04 LAB — BASIC METABOLIC PANEL
Anion gap: 6 (ref 5–15)
BUN: 20 mg/dL (ref 4–21)
BUN: 20 mg/dL (ref 6–20)
CALCIUM: 8.3 mg/dL — AB (ref 8.9–10.3)
CO2: 26 mmol/L (ref 22–32)
CREATININE: 0.5 mg/dL (ref 0.5–1.1)
Chloride: 111 mmol/L (ref 101–111)
Creatinine, Ser: 0.54 mg/dL (ref 0.44–1.00)
GFR calc Af Amer: >60 mL/min (ref >60)
GLUCOSE: 89 mg/dL
GLUCOSE: 89 mg/dL (ref 65–99)
Potassium: 3.8 mmol/L (ref 3.5–5.1)
SODIUM: 143 mmol/L (ref 137–147)
Sodium: 143 mmol/L (ref 135–145)

## 2015-06-04 LAB — CBC
HCT: 29.5 % — ABNORMAL LOW (ref 36.0–46.0)
Hemoglobin: 9.9 g/dL — ABNORMAL LOW (ref 12.0–15.0)
MCH: 31.1 pg (ref 26.0–34.0)
MCHC: 33.6 g/dL (ref 30.0–36.0)
MCV: 92.8 fL (ref 78.0–100.0)
Platelets: 141 10*3/uL — ABNORMAL LOW (ref 150–400)
RBC: 3.18 MIL/uL — ABNORMAL LOW (ref 3.87–5.11)
RDW: 13.2 % (ref 11.5–15.5)
WBC: 6.6 10*3/uL (ref 4.0–10.5)

## 2015-06-04 LAB — CBC AND DIFFERENTIAL: WBC: 6.6 10*3/mL

## 2015-06-04 MED ORDER — MOMETASONE FURO-FORMOTEROL FUM 200-5 MCG/ACT IN AERO
2.0000 | INHALATION_SPRAY | Freq: Two times a day (BID) | RESPIRATORY_TRACT | Status: DC
  Administered 2015-06-04 – 2015-06-05 (×2): 2 via RESPIRATORY_TRACT
  Filled 2015-06-04: qty 8.8

## 2015-06-04 MED ORDER — BENZONATATE 100 MG PO CAPS
100.0000 mg | ORAL_CAPSULE | Freq: Three times a day (TID) | ORAL | Status: DC
  Administered 2015-06-04 (×3): 100 mg via ORAL
  Filled 2015-06-04 (×3): qty 1

## 2015-06-04 MED ORDER — PREDNISONE 5 MG PO TABS
10.0000 mg | ORAL_TABLET | Freq: Every day | ORAL | Status: DC
  Administered 2015-06-04: 10 mg via ORAL

## 2015-06-04 MED ORDER — QUETIAPINE FUMARATE 25 MG PO TABS
25.0000 mg | ORAL_TABLET | Freq: Every day | ORAL | Status: DC
  Administered 2015-06-04: 25 mg via ORAL
  Filled 2015-06-04: qty 1

## 2015-06-04 NOTE — Telephone Encounter (Signed)
Patient's daughter st the patient is in the hospital being treated for a fall. She fell at home and broke some ribs. She was at the hospital for 48 hours when she tried to get out of bed unattended and fell again, breaking her C1 vertebrae.  She agrees to referral for TAVR, but only if her mom recovers. She will call when she is doing better to schedule appointment with Dr. Excell Seltzerooper.

## 2015-06-04 NOTE — Progress Notes (Signed)
CSW continuing to follow.   CSW reviewed chart and over the weekend daughter had changed plan to home with hospice, but pt daughter asking to speak with CSW today as she wishes to re-discuss plan for discharge.  CSW spoke with pt daughter, [name] via telephone. Pt daughter reports that she initially had changed plan for home with home care and private duty sitters, but feels that rehab at SNF will be better option at this time and wants to go back with plan for [facility]. Pt daughter is aware that pt will have to participate in rehab and agreeable. Pt daughter reports that she is making arrangements with a private care agency through [facility] (patient is not a patient there) to provide some private sitter services at [facility].   Pt has had one-to-one sitter since fall in hospital on Friday and CSW spoke with MD who reports anticipation that pt will be medically ready for discharge to [facility] tomorrow and CSW notified MD that pt has to be without one-to-one hospital sitter for 24 hours per SNF policies. MD spoke to RN to assess situation and one-to-one sitter discontinued.   CSW spoke with pt daughter re: policy of SNF that pt must be without hospital provided one-to-one sitter for 24 hours prior to transition to [facility] as facilities do not provide safety sitters. Pt daughter expressed frustration as she states she would have made arrangements if known earlier in the day and she is not feeling well, so cannot be at hospital. CSW discussed with unit RN and Water quality scientistunit director and all safety precautions are going to be put in place and pt room is right at nurses station where nursing secretary will be tonight. Safety sitter that has been with pt today reports that pt has been calm and alert and oriented. Unit staff aware that if safety concerns arise to re-order sitter. Pt daughter updated regarding the above and expressed understanding and appreciative of precautions being put in place since  one-to-one sitter had to be discontinued due to plan to transition to SNF tomorrow.   CSW spoke with [facility] and confirmed that facility can accept pt tomorrow.   CSW left message with pt insurance [employer] and will follow up with insurance in the morning regarding authorization as previous authorization has expired.  CSW plans to meet with pt daughter tomorrow at 1 pm and it is anticipated that pt will be medically ready for discharge tomorrow.   CSW to continue to follow.  Loletta SpecterSuzanna Jaquelyne Firkus, MSW, LCSW Clinical Social Work [phone]

## 2015-06-04 NOTE — Care Management Important Message (Signed)
Important Message  Patient Details  Name: Karina Andrews MRN: [id number] Date of Birth: [birth year]   Medicare Important Message Given:  Yes    Haskell FlirtJamison, Lula Michaux 06/04/2015, 11:04 AMImportant Message  Patient Details  Name: Karina Andrews MRN: [id number] Date of Birth: [birth year]   Medicare Important Message Given:  Yes    Haskell FlirtJamison, Helvi Royals 06/04/2015, 11:04 AM

## 2015-06-04 NOTE — Progress Notes (Signed)
[facility] Progress Note   Karina Andrews  ZOX:[id number]  DOB: 1930/06/05  DOA: 05/30/2015 PCP: Oliver Barre, MD  Brief narrative: Karina Andrews is a 82 y.o. female with HTN, HLD, COPD, ongong smoking severe AS, alcohol abuse who presents after she tripped and fell onto her sight sided sustaining right rib and scapula fractures.    Subjective: Rested quite well for 24 hrs after Morphine and Seroquel. States today she would like to get up and start "practicing with her walker". Good appetite. Still has pain in right rib cage. Coughing frequently.   Assessment/Plan: Principal Problem: As a result of fall on 4/7:  (A) C1 fracture - mildly displaced fractures of the left half of C1 anteriorly and posteriorly, without compromise of the central canal- see CT report below - Neurosurgery, Dr Wynetta Emery recommend hard C collar and f/u with him in 4 wks  (B) Right orbital lacerations right maxillary fractures - Dr Suszanne Conners has sutured the lacerations- sutures need to be removed on 4/15- she can be sent to him if no one at the SNF can remove them -keep incision clean and moist- antibiotic ointment TID x 5 days - fractures need no intervention  (C) Right eye injuries - please see extensive opthalmology note (Dr Wynell Balloon) - need to call for "increased proptosis, chemosis, or vision changes" - recommends warm compresses to the lids, can use artificial tears as needed for irritation - needs to f/u with primary ophthalmologist in a few months  Closed rib fractures and scapular fracture - from fall on admission - right anterior 4th through 7th ribs and right scapular tip - incentive spirometry, pain control  Dehydration On 4/8- given 24 hrs of IVF- will stop today  COPD exacerbation - weaning steroids   - cont Mucinex DM, Duonebs, Z pak - Tessalon TID for cough  Smoker - she tells me that she smokes 1ppd- cont Nicotine patch- advised her to stop - given a script for Chantix  as outpt but "is still deciding" whether to start it- will keep her on Nicotine patches  ETOH abuse and withdrawal - tells me she drinks 2 glasses of wine a day-  - confusion intensified over 4/7-8 resulting in the fall and need for sitter - withdrawal resolved - might have some sundowning- cont Seroquel at bedtime  Dysarthria - MRI done as outpt recently negative for CVA- Neuro, Dr Dohmeier evaluated her on on 4/5 and feels speech issues are related to breathing issues from Al stenosis  Ao Stenosis - symptom of dyspnea with exertion- Dr Mayford Knife following her and considering TAVR- has referred her to Dr Excell Seltzer for an eval- her daughter tells me that the patient did not want any procedures   Pulmonary nodules - follows with Dr Sherene Sires for COPD-either he or his PCP can cont oupt surveillance as warrented    Hyperlipidemia - Crestor    Essential hypertension - Cozaar held today  Hypokalemia - replaced  Anemia/ thrombocytopenia - follow     Antibiotics: Anti-infectives    Start     Dose/Rate Route Frequency Ordered Stop   06/01/15 1000  azithromycin (ZITHROMAX) tablet 250 mg     250 mg Oral Daily 05/31/15 0441 06/04/15 0757   05/31/15 1000  azithromycin (ZITHROMAX) tablet 500 mg     500 mg Oral Daily 05/31/15 0441 05/31/15 1057     Code Status: DNR now after by myself and then Dr Linna Darner Family Communication: with daughter Sandi Mariscal Disposition Plan: will go to  SNF- due to severe AS which is symptomatic, COPD, C1 fx, I have asked for a palliative care consult - appreciate their help- plan for palliative care to follow at SNF DVT prophylaxis: Lovenox Consultants: opthalmology, NS, ENT, palliative Procedures:     Objective: Filed Weights   05/31/15 0601  Weight: 61.689 kg (136 lb)    Intake/Output Summary (Last 24 hours) at 06/04/15 1657 Last data filed at 06/04/15 1230  Gross per 24 hour  Intake 4127.91 ml  Output      0 ml  Net 4127.91 ml     Vitals Filed  Vitals:   06/03/15 2020 06/03/15 2121 06/04/15 0537 06/04/15 1412  BP:  128/65 154/79 146/69  Pulse:  100 104 90  Temp:  97.8 F (36.6 C) 98.3 F (36.8 C) 98.7 F (37.1 C)  TempSrc:  Oral Oral Oral  Resp:  19 19 20   Height:      Weight:      SpO2: 94% 96% 96% 96%    Exam:  General:  Pt is alert, restless   HEENT: No icterus, No thrush, oral mucosa moist- right supraorbital laceration, significant ecchymosis and edema of eye  Cardiovascular: regular rate and rhythm, S1/S2 No murmur  Respiratory: rhonchi bilaterally  Abdomen: Soft, +Bowel sounds, non tender, non distended, no guarding  MSK: No cyanosis or clubbing- no pedal edema   Data Reviewed: Basic Metabolic Panel:  Recent Labs Lab 05/31/15 0046 05/31/15 0447 06/01/15 0413 06/02/15 0722 06/04/15 0422  NA 142 140 144 139 143  K 4.0 4.4 4.2 3.4* 3.8  CL 108 107 112* 107 111  CO2 22 24 24 23 26   GLUCOSE 113* 119* 139* 105* 89  BUN 16 16 13 20 20   CREATININE 0.53 0.58 0.46 0.47 0.54  CALCIUM 9.2 9.2 8.7* 8.3* 8.3*   Liver Function Tests:  Recent Labs Lab 05/31/15 0046  AST 23  ALT 23  ALKPHOS 79  BILITOT 0.2*  PROT 7.3  ALBUMIN 4.1   No results for input(s): LIPASE, AMYLASE in the last 168 hours. No results for input(s): AMMONIA in the last 168 hours. CBC:  Recent Labs Lab 05/31/15 0046 05/31/15 0447 06/01/15 0413 06/02/15 0722 06/04/15 0422  WBC 10.3 8.4 5.4 8.8 6.6  NEUTROABS 8.3*  --   --   --   --   HGB 12.5 12.3 10.7* 10.7* 9.9*  HCT 38.1 36.7 32.1* 31.8* 29.5*  MCV 93.8 93.9 93.6 92.7 92.8  PLT 164 162 133* 128* 141*   Cardiac Enzymes: No results for input(s): CKTOTAL, CKMB, CKMBINDEX, TROPONINI in the last 168 hours. BNP (last 3 results) No results for input(s): BNP in the last 8760 hours.  ProBNP (last 3 results)  Recent Labs  04/11/15 1731  PROBNP 33.0    CBG:  Recent Labs Lab 05/31/15 0758 06/01/15 0810  GLUCAP 133* 126*    No results found for this or any  previous visit (from the past 240 hour(s)).   Studies: No results found.  Scheduled Meds:  Scheduled Meds: . bacitracin-polymyxin b   Right Eye TID  . benzonatate  100 mg Oral TID  . folic acid  1 mg Oral Daily  . ibuprofen  400 mg Oral QID  . ipratropium-albuterol  3 mL Nebulization TID  . mometasone-formoterol  2 puff Inhalation BID  . multivitamin with minerals  1 tablet Oral Daily  . nicotine  21 mg Transdermal Daily  . predniSONE  10 mg Oral Q breakfast  . QUEtiapine  25  mg Oral QHS  . rosuvastatin  10 mg Oral Daily  . sodium chloride flush  3 mL Intravenous Q12H  . thiamine  100 mg Oral Daily   Continuous Infusions:    Time spent on care of this patient: 35 min   Dominion Kathan, MD 06/04/2015, 4:57 PM  LOS: 3 days   [facility] Office  [phone] Pager - Text Page per [email or web address] If 7PM-7AM, please contact night-coverage [email or web address]

## 2015-06-04 NOTE — Progress Notes (Signed)
PT Cancellation Note  Patient Details Name: Karina Andrews MRN: [id number] DOB: Feb 04, 1931   Cancelled Treatment:    Reason Eval/Treat Not Completed: Attempted tx session-pt politely declined on today. Agreeable to therapy checking back on tomorrow.    Rebeca Alertorter, Martie [facility] 06/04/2015, 3:34 PM [phone]

## 2015-06-05 DIAGNOSIS — I350 Nonrheumatic aortic (valve) stenosis: Secondary | ICD-10-CM | POA: Diagnosis not present

## 2015-06-05 DIAGNOSIS — S12001D Unspecified nondisplaced fracture of first cervical vertebra, subsequent encounter for fracture with routine healing: Secondary | ICD-10-CM | POA: Diagnosis not present

## 2015-06-05 DIAGNOSIS — J441 Chronic obstructive pulmonary disease with (acute) exacerbation: Secondary | ICD-10-CM | POA: Diagnosis not present

## 2015-06-05 DIAGNOSIS — S2241XS Multiple fractures of ribs, right side, sequela: Secondary | ICD-10-CM | POA: Diagnosis not present

## 2015-06-05 DIAGNOSIS — F329 Major depressive disorder, single episode, unspecified: Secondary | ICD-10-CM | POA: Diagnosis not present

## 2015-06-05 DIAGNOSIS — J449 Chronic obstructive pulmonary disease, unspecified: Secondary | ICD-10-CM | POA: Diagnosis not present

## 2015-06-05 DIAGNOSIS — Z720 Tobacco use: Secondary | ICD-10-CM | POA: Diagnosis not present

## 2015-06-05 DIAGNOSIS — E785 Hyperlipidemia, unspecified: Secondary | ICD-10-CM | POA: Diagnosis not present

## 2015-06-05 DIAGNOSIS — F1010 Alcohol abuse, uncomplicated: Secondary | ICD-10-CM | POA: Diagnosis not present

## 2015-06-05 DIAGNOSIS — S2231XD Fracture of one rib, right side, subsequent encounter for fracture with routine healing: Secondary | ICD-10-CM | POA: Diagnosis not present

## 2015-06-05 DIAGNOSIS — F17200 Nicotine dependence, unspecified, uncomplicated: Secondary | ICD-10-CM | POA: Diagnosis not present

## 2015-06-05 DIAGNOSIS — S12000S Unspecified displaced fracture of first cervical vertebra, sequela: Secondary | ICD-10-CM | POA: Diagnosis not present

## 2015-06-05 DIAGNOSIS — S0242XD Fracture of alveolus of maxilla, subsequent encounter for fracture with routine healing: Secondary | ICD-10-CM | POA: Diagnosis not present

## 2015-06-05 DIAGNOSIS — M6281 Muscle weakness (generalized): Secondary | ICD-10-CM | POA: Diagnosis not present

## 2015-06-05 DIAGNOSIS — S2232XD Fracture of one rib, left side, subsequent encounter for fracture with routine healing: Secondary | ICD-10-CM

## 2015-06-05 DIAGNOSIS — I10 Essential (primary) hypertension: Secondary | ICD-10-CM | POA: Diagnosis not present

## 2015-06-05 DIAGNOSIS — R278 Other lack of coordination: Secondary | ICD-10-CM | POA: Diagnosis not present

## 2015-06-05 DIAGNOSIS — S02401D Maxillary fracture, unspecified, subsequent encounter for fracture with routine healing: Secondary | ICD-10-CM | POA: Diagnosis not present

## 2015-06-05 DIAGNOSIS — S42191D Fracture of other part of scapula, right shoulder, subsequent encounter for fracture with routine healing: Secondary | ICD-10-CM | POA: Diagnosis not present

## 2015-06-05 DIAGNOSIS — R531 Weakness: Secondary | ICD-10-CM | POA: Diagnosis not present

## 2015-06-05 DIAGNOSIS — D508 Other iron deficiency anemias: Secondary | ICD-10-CM | POA: Diagnosis not present

## 2015-06-05 DIAGNOSIS — Z9181 History of falling: Secondary | ICD-10-CM | POA: Diagnosis not present

## 2015-06-05 DIAGNOSIS — S2241XD Multiple fractures of ribs, right side, subsequent encounter for fracture with routine healing: Secondary | ICD-10-CM

## 2015-06-05 DIAGNOSIS — S0240CS Maxillary fracture, right side, sequela: Secondary | ICD-10-CM | POA: Diagnosis not present

## 2015-06-05 DIAGNOSIS — S12000A Unspecified displaced fracture of first cervical vertebra, initial encounter for closed fracture: Secondary | ICD-10-CM | POA: Diagnosis not present

## 2015-06-05 DIAGNOSIS — R262 Difficulty in walking, not elsewhere classified: Secondary | ICD-10-CM | POA: Diagnosis not present

## 2015-06-05 DIAGNOSIS — D649 Anemia, unspecified: Secondary | ICD-10-CM | POA: Diagnosis not present

## 2015-06-05 DIAGNOSIS — F0391 Unspecified dementia with behavioral disturbance: Secondary | ICD-10-CM | POA: Diagnosis not present

## 2015-06-05 DIAGNOSIS — F419 Anxiety disorder, unspecified: Secondary | ICD-10-CM | POA: Diagnosis not present

## 2015-06-05 DIAGNOSIS — S0541XD Penetrating wound of orbit with or without foreign body, right eye, subsequent encounter: Secondary | ICD-10-CM | POA: Diagnosis not present

## 2015-06-05 DIAGNOSIS — R471 Dysarthria and anarthria: Secondary | ICD-10-CM | POA: Diagnosis not present

## 2015-06-05 DIAGNOSIS — R259 Unspecified abnormal involuntary movements: Secondary | ICD-10-CM | POA: Diagnosis not present

## 2015-06-05 DIAGNOSIS — S2231XA Fracture of one rib, right side, initial encounter for closed fracture: Secondary | ICD-10-CM | POA: Diagnosis not present

## 2015-06-05 DIAGNOSIS — S2241XA Multiple fractures of ribs, right side, initial encounter for closed fracture: Secondary | ICD-10-CM

## 2015-06-05 DIAGNOSIS — M810 Age-related osteoporosis without current pathological fracture: Secondary | ICD-10-CM

## 2015-06-05 MED ORDER — LOSARTAN POTASSIUM 50 MG PO TABS
50.0000 mg | ORAL_TABLET | Freq: Every day | ORAL | Status: DC
  Administered 2015-06-05: 50 mg via ORAL
  Filled 2015-06-05: qty 1

## 2015-06-05 MED ORDER — GUAIFENESIN ER 600 MG PO TB12
600.0000 mg | ORAL_TABLET | Freq: Two times a day (BID) | ORAL | Status: DC
Start: 2015-06-05 — End: 2015-08-03

## 2015-06-05 MED ORDER — QUETIAPINE FUMARATE 25 MG PO TABS
50.0000 mg | ORAL_TABLET | Freq: Every day | ORAL | Status: DC
Start: 2015-06-05 — End: 2015-06-14

## 2015-06-05 MED ORDER — POLYVINYL ALCOHOL 1.4 % OP SOLN: 1.0000 [drp] | OPHTHALMIC | Status: DC | PRN

## 2015-06-05 MED ORDER — HYDRALAZINE HCL 20 MG/ML IJ SOLN
10.0000 mg | Freq: Once | INTRAMUSCULAR | Status: DC
  Filled 2015-06-05: qty 1

## 2015-06-05 MED ORDER — BACITRACIN-POLYMYXIN B 500-10000 UNIT/GM OP OINT: TOPICAL_OINTMENT | Freq: Three times a day (TID) | OPHTHALMIC | Status: AC

## 2015-06-05 MED ORDER — ALBUTEROL SULFATE (2.5 MG/3ML) 0.083% IN NEBU: 2.5000 mg | INHALATION_SOLUTION | RESPIRATORY_TRACT | Status: DC | PRN

## 2015-06-05 MED ORDER — ADULT MULTIVITAMIN W/MINERALS CH: 1.0000 | ORAL_TABLET | Freq: Every day | ORAL | Status: DC

## 2015-06-05 MED ORDER — HYDROXYZINE HCL 50 MG PO TABS
50.0000 mg | ORAL_TABLET | Freq: Once | ORAL | Status: AC
  Administered 2015-06-05: 50 mg via ORAL
  Filled 2015-06-05: qty 1

## 2015-06-05 MED ORDER — HYDROCODONE-ACETAMINOPHEN 5-325 MG PO TABS: 1.0000 | ORAL_TABLET | Freq: Four times a day (QID) | ORAL | Status: DC | PRN

## 2015-06-05 MED ORDER — HYDROXYZINE HCL 50 MG/ML IM SOLN
25.0000 mg | Freq: Once | INTRAMUSCULAR | Status: DC
  Filled 2015-06-05: qty 0.5

## 2015-06-05 MED ORDER — NICOTINE 21 MG/24HR TD PT24: 21.0000 mg | MEDICATED_PATCH | Freq: Every day | TRANSDERMAL | Status: DC

## 2015-06-05 MED ORDER — BENZONATATE 100 MG PO CAPS: 100.0000 mg | ORAL_CAPSULE | Freq: Three times a day (TID) | ORAL | Status: DC

## 2015-06-05 MED ORDER — IPRATROPIUM-ALBUTEROL 0.5-2.5 (3) MG/3ML IN SOLN: 3.0000 mL | Freq: Four times a day (QID) | RESPIRATORY_TRACT | Status: DC | PRN

## 2015-06-05 NOTE — Progress Notes (Signed)
Pt refusing all medications.  Explained importance of medications and continued to refused.  MD made aware.  Will continue to monitor closely.

## 2015-06-05 NOTE — Progress Notes (Signed)
Occupational Therapy Treatment Patient Details Name: AJANEE BUREN MRN: [id number] DOB: 10-23-1930 Today's Date: 06/05/2015    History of present illness 82 yo female admitted with R 4th-7th rib fractures and R scapular tip fx after having a fall. Hx of ETOH, COPD, aortic stenosis. S/p fall with min displaced C1 fx on L side without compromises of central canal    OT comments  Pt agreeable to sitting EOB and brushing teeth this session.  Plan is for SNF  Follow Up Recommendations  SNF    Equipment Recommendations  3 in 1 bedside comode    Recommendations for Other Services      Precautions / Restrictions Precautions Precautions: Fall;Other (comment) Precaution Comments: rib fxs Required Braces or Orthoses: Cervical Brace (at all times; no pillow) Cervical Brace: Hard collar;At all times Restrictions Weight Bearing Restrictions: No       Mobility Bed Mobility Overal bed mobility: Needs Assistance Bed Mobility: Supine to Sit     Supine to sit: Min guard;HOB elevated Sit to supine: Min guard;HOB elevated   General bed mobility comments: HOB was raised and pt turned body to get to EOB and back into bed at min guard level for safety:  performed twice  Transfers                 General transfer comment: pt not agreeable to mobilization this session    Balance Overall balance assessment: Needs assistance   Sitting balance-Leahy Scale: Good Sitting balance - Comments: sat EOB ~3-5 minutes with Min guard assist.                            ADL       Grooming: Set up;Sitting                                 General ADL Comments: pt only agreeable to sitting EOB this afternoon and agreeable to brushing teeth; however, she did not want to use toothpaste.  Pt did not want to get up to [facility] nor do further ADL tasks.        Vision                     Perception     Praxis      Cognition   Behavior During Therapy: WFL for  tasks assessed/performed Overall Cognitive Status: No family/caregiver present to determine baseline cognitive functioning Area of Impairment: Safety/judgement          Safety/Judgement: Decreased awareness of safety;Decreased awareness of deficits          Extremity/Trunk Assessment               Exercises     Shoulder Instructions       General Comments      Pertinent Vitals/ Pain       Pain Assessment: No/denies pain  Home Living                                          Prior Functioning/Environment              Frequency Min 2X/week     Progress Toward Goals  OT Goals(current goals can now be found in the care plan section)  Progress towards OT goals:  Progressing toward goals (slowly)     Plan      Co-evaluation    PT/OT/SLP Co-Evaluation/Treatment: Yes Reason for Co-Treatment: For patient/therapist safety PT goals addressed during session: Mobility/safety with mobility OT goals addressed during session: ADL's and self-care      End of Session     Activity Tolerance Patient tolerated treatment well   Patient Left in bed;with call bell/phone within reach;with bed alarm set   Nurse Communication          Time: [id number] OT Time Calculation (min): 10 min  Charges: OT General Charges $OT Visit: 1 Procedure OT Treatments $Self Care/Home Management : 8-22 mins  Rex Oesterle 06/05/2015, 1:07 PM  Marica OtterMaryellen Tamaria Dunleavy, OTR/L [phone] 06/05/2015

## 2015-06-05 NOTE — Progress Notes (Signed)
Physical Therapy Treatment Patient Details Name: Karina Andrews MRN: [id number] DOB: [birth year] Today's Date: 06/05/2015    History of Present Illness 82 yo female admitted with R 4th-7th rib fractures and R scapular tip fx after having a fall. 4/8 2nd fall out of bed sustained C1 fx.  Hx of ETOH, COPD, aortic stenosis.     PT Comments    Pt agreeable to sitting EOB. Sat EOB ~3-5 minutes with Min guard assist while brushing teeth with supervision/set up and  assistance from OT. Continue to recommend SNF for rehab.   Follow Up Recommendations  SNF     Equipment Recommendations  None recommended by PT    Recommendations for Other Services OT consult     Precautions / Restrictions Precautions Precautions: Fall Precaution Comments: rib fxs Required Braces or Orthoses: Cervical Brace Cervical Brace: Hard collar;At all times Restrictions Weight Bearing Restrictions: No    Mobility  Bed Mobility Overal bed mobility: Needs Assistance Bed Mobility: Supine to Sit;Sit to Supine     Supine to sit: Min guard;HOB elevated Sit to supine: Min guard;HOB elevated   General bed mobility comments: x2. Moderate reliance on bedrail. close guard for safety.   Transfers                 General transfer comment: Pt did not wish to attempt on today.  Ambulation/Gait                 Stairs            Wheelchair Mobility    Modified Rankin (Stroke Patients Only)       Balance Overall balance assessment: Needs assistance   Sitting balance-Leahy Scale: Good Sitting balance - Comments: sat EOB ~3-5 minutes with Min guard assist.                             Cognition Arousal/Alertness: Awake/alert Behavior During Therapy: WFL for tasks assessed/performed Overall Cognitive Status: Impaired/Different from baseline Area of Impairment: Safety/judgement         Safety/Judgement: Decreased awareness of safety;Decreased awareness of deficits          Exercises      General Comments        Pertinent Vitals/Pain Pain Assessment: No/denies pain    Home Living                      Prior Function            PT Goals (current goals can now be found in the care plan section) Progress towards PT goals: Progressing toward goals (slowly)    Frequency  Min 3X/week    PT Plan Current plan remains appropriate    Co-evaluation PT/OT/SLP Co-Evaluation/Treatment: Yes Reason for Co-Treatment: For patient/therapist safety PT goals addressed during session: Mobility/safety with mobility       End of Session   Activity Tolerance: Patient tolerated treatment well Patient left: in bed;with call bell/phone within reach;with bed alarm set     Time: [id number] PT Time Calculation (min) (ACUTE ONLY): 8 min  Charges:                       G CodesRebeca Alert:      Dayanara Sherrill [facility] [phone]

## 2015-06-05 NOTE — Progress Notes (Signed)
Report called to Thurston Poundsrey, Charity fundraiserN at [facility].  All questions answered.   Pt transported by PTAR.

## 2015-06-05 NOTE — Progress Notes (Signed)
This RN and NT attempted to check patient to see if she had an incontinent episode. Patient refused to let the RN or tech check her for incontinence. The patient stated she was dry and did not need to be checked. This RN also attempted to collect sputum sample that was in the specimen cup. When this RN picked up the specimen cup the patient repeatedly said no and demanded to see the cup. The patient then proceeded to put tissue in the specimen cup and stated "this is spooky." The RN informed the patient that the doctor needs a sample of her sputum. The patient verbalized understanding. Will continue to monitor closely.

## 2015-06-05 NOTE — Discharge Summary (Addendum)
Physician Discharge Summary  Karina Andrews WUJ:[id number] DOB: 1930-11-05 DOA: 05/30/2015  PCP: Oliver BarreJames John, MD  Admit date: 05/30/2015 Discharge date: 06/05/2015  Time spent: 65 minutes  CODE STATUS: DNR  Recommendations for Outpatient Follow-up:  1. Do not be aggressive with PT- patient's goal should be to ambulate with walker 2. Noted below in regards to f/u with opthalmology, NS and ENT (only if sutures cannot be removed at SNF)  Discharge Condition: stable    Discharge Diagnoses:  Principal Problem:   Multiple fractures of ribs of right side Active Problems:   Aortic stenosis, severe   COPD exacerbation (HCC)   Fall   Closed C1 fracture (HCC)   Fracture of maxilla, closed   Hyperlipidemia   NICOTINE ADDICTION   Depression   Essential hypertension   DEGENERATIVE DISC DISEASE, CERVICAL SPINE, WITH MYELOPATHY   Senile osteoporosis   Cigarette smoker   Indeterminate pulmonary nodules   Alcohol abuse   Encounter for palliative care   Goals of care, counseling/discussion   History of present illness:  Karina Andrews is a 82 y.o. female who lives alone at home with HTN, HLD, COPD, ongong smoking severe AS, alcohol abuse who presents after she tripped and fell while walking with her walker onto her sight sided sustaining right rib and right scapular tip fractures. she was also started on treatment for a COPD exacerbation.    Hospital Course:  Principal Problem: While in the hospital the patient underwent ETOH withdrawal which began to peak on 4/7. She fell out of bed that night just minutes after the nurse had settled her in and sustained C1 &  R maxillary fractures, R supraorbital laceration and R intraocular bleed.    Closed R rib fractures and scapular fracture - from fall on admission - right anterior 4th through 7th ribs and right scapular tip - encourage incentive spirometry - pain is controlled with Vicodin   COPD exacerbation - weaned off of steroids   - Z pak completed - still quite congested- suspect chronic bronchitis - Tessalon TID for cough is helping a great deal in surpressing cough- will continue it routine - continue Spiriva and Symbicort - Mucinex Q12 added due to ongoing congestion - Albuterol inhaler and Duonebs PRN   Smoker - she tells me that she smokes 1ppd- cont Nicotine patch- advised her to stop - given a script for Chantix as outpt but "is still deciding" whether to start it- will keep her on Nicotine patches  ETOH abuse and withdrawal - tells me she drinks 2 glasses of wine a day-  - confusion intensified over 4/7-8 resulting in the fall and need for sitter - withdrawal resolved - might have some sundowning- cont Seroquel at bedtime  As a result of fall on 4/7:  (A) C1 fracture - mildly displaced fractures of the left half of C1 anteriorly and posteriorly, without compromise of the central canal- see CT report below - Neurosurgery, Dr Wynetta Emeryram recommend hard C collar and f/u with him in 4 wks for further imaging   (B) Right orbital lacerations right maxillary fractures - Dr Suszanne Connerseoh has sutured the lacerations- sutures need to be removed on 4/15- she can be sent to him if no one at the SNF can remove them -keep incision clean and moist- antibiotic ointment TID x 5 days - fractures need no intervention  (C) Right eye injuries -  Evaluated by Dr Wynell Balloonhristopher Weaver - need to call for "increased proptosis, chemosis, or vision changes" - recommends  warm compresses to the lids, can use artificial tears as needed for irritation - needs to f/u with primary ophthalmologist in a few months  Dysarthria - MRI done as outpt recently negative for CVA- Neuro, Dr Dohmeier evaluated her on on 4/5 and feels speech issues are related to breathing issues from Al stenosis  Ao Stenosis - symptom of dyspnea with exertion- Dr Mayford Knife following her and considering TAVR- has referred her to Dr Excell Seltzer for an eval-   ECHO 04/26/14  -  Left ventricle: The cavity size was normal. Wall thickness was  normal. Systolic function was normal. The estimated ejection  fraction was in the range of 60% to 65%. Wall motion was normal;  there were no regional wall motion abnormalities. Doppler  parameters are consistent with abnormal left ventricular  relaxation (grade 1 diastolic dysfunction). Doppler parameters  are consistent with high ventricular filling pressure. - Aortic valve: There was severe stenosis. There was trivial  regurgitation. Mean gradient (S): 44 mm Hg. Peak gradient (S): 71  mm Hg. Valve area (VTI): 0.75 cm^2. Valve area (Vmax): 0.78 cm^2.  Valve area (Vmean): 0.75 cm^2. - Mitral valve: Calcified annulus.  Dementia with sundowning - improved with 25 mg Seroquel at bedtime -  - may need 25 mg in AM as well- follow- I have not ordered this yet - patient not lucid enough to make her own decisions - daughter [name] is the POA  Pulmonary nodules - follows with Dr Sherene Sires for COPD-either he or his PCP can cont oupt surveillance as warrented   Hyperlipidemia - Crestor   Essential hypertension - Cozaar    Hypokalemia - replaced  Anemia/ thrombocytopenia - follow- both are mild  Consultations:  Palliative care    ENT  Opthalmology  Phone consult with NS   Discharge Exam: Filed Weights   05/31/15 0601  Weight: 61.689 kg (136 lb)   Filed Vitals:   06/05/15 0554 06/05/15 0814  BP: 183/77 181/77  Pulse: 99   Temp: 98.1 F (36.7 C)   Resp: 18     General: AAO x 3, no distress Cardiovascular: RRR, no murmurs  Respiratory: clear to auscultation bilaterally GI: soft, non-tender, non-distended, bowel sound positive  Discharge Instructions You were cared for by a hospitalist during your hospital stay. If you have any questions about your discharge medications or the care you received while you were in the hospital after you are discharged, you can call the unit and asked to speak with  the hospitalist on call if the hospitalist that took care of you is not available. Once you are discharged, your primary care physician will handle any further medical issues. Please note that NO REFILLS for any discharge medications will be authorized once you are discharged, as it is imperative that you return to your primary care physician (or establish a relationship with a primary care physician if you do not have one) for your aftercare needs so that they can reassess your need for medications and monitor your lab values.      Discharge Instructions    Diet - low sodium heart healthy    Complete by:  As directed      Discharge instructions    Complete by:  As directed   Hard c collar must be on at all times- please ensure no skin breakdown is occuring under it.     Increase activity slowly    Complete by:  As directed  Medication List    STOP taking these medications        hydrOXYzine 50 MG tablet  Commonly known as:  ATARAX/VISTARIL     varenicline 1 MG tablet  Commonly known as:  CHANTIX CONTINUING MONTH PAK      TAKE these medications        albuterol 108 (90 Base) MCG/ACT inhaler  Commonly known as:  VENTOLIN HFA  Inhale 2 puffs into the lungs every 6 (six) hours as needed for wheezing or shortness of breath.     bacitracin-polymyxin b ophthalmic ointment  Commonly known as:  POLYSPORIN  Place into the right eye 3 (three) times daily. apply to eye every 12 hours while awake     benzonatate 100 MG capsule  Commonly known as:  TESSALON  Take 1 capsule (100 mg total) by mouth 3 (three) times daily.     budesonide-formoterol 160-4.5 MCG/ACT inhaler  Commonly known as:  SYMBICORT  Inhale 2 puffs into the lungs 2 (two) times daily.     guaiFENesin 600 MG 12 hr tablet  Commonly known as:  MUCINEX  Take 1 tablet (600 mg total) by mouth 2 (two) times daily.     HYDROcodone-acetaminophen 5-325 MG tablet  Commonly known as:  NORCO/VICODIN  Take 1 tablet  by mouth every 6 (six) hours as needed for moderate pain.     ipratropium-albuterol 0.5-2.5 (3) MG/3ML Soln  Commonly known as:  DUONEB  Take 3 mLs by nebulization every 6 (six) hours as needed (if inhaler does not resolve wheezing).     losartan 50 MG tablet  Commonly known as:  COZAAR  Take 1 tablet (50 mg total) by mouth daily.     multivitamin with minerals Tabs tablet  Take 1 tablet by mouth daily.     nicotine 21 mg/24hr patch  Commonly known as:  NICODERM CQ - dosed in mg/24 hours  Place 1 patch (21 mg total) onto the skin daily. Decrease to 14 mg in 3 days     polyvinyl alcohol 1.4 % ophthalmic solution  Commonly known as:  LIQUIFILM TEARS  Place 1 drop into the right eye as needed for dry eyes.     QUEtiapine 25 MG tablet  Commonly known as:  SEROQUEL  Take 2 tablets (50 mg total) by mouth at bedtime.     rosuvastatin 10 MG tablet  Commonly known as:  CRESTOR  Take 1 tablet (10 mg total) by mouth daily.     tiotropium 18 MCG inhalation capsule  Commonly known as:  SPIRIVA  Place 18 mcg into inhaler and inhale daily.       No Known Allergies Follow-up Information    Follow up with Oliver Barre, MD In 3 days.   Specialties:  Internal Medicine, Radiology   Why:  ed follow up for rib fracture   Contact information:   [address] Maggie Schwalbe [facility] [place] [address] [phone]       Follow up with [facility] .   Specialty:  Skilled Nursing Facility   Contact information:   [address] [place] [address] [phone]       The results of significant diagnostics from this hospitalization (including imaging, microbiology, ancillary and laboratory) are listed below for reference.    Significant Diagnostic Studies: Dg Chest 2 View  06/02/2015  CLINICAL DATA:  Re-evaluate right rib fractures post new fall tonight in her hospital room. EXAM: CHEST  2 VIEW COMPARISON:  Radiograph yesterday.  FINDINGS: The fourth through seventh right  lateral rib fractures are unchanged in alignment. No evidence of new fracture since prior exam. No pneumothorax. Cardiomediastinal contours are unchanged. Decreased vascular congestion from prior. Unchanged right lung base atelectasis. No definite pleural effusion. IMPRESSION: 1. Unchanged right rib fractures. 2. Unchanged right basilar atelectasis. 3. Decreased vascular congestion.  No new abnormality is seen. Electronically Signed   By: Rubye Oaks M.D.   On: 06/02/2015 02:41   Dg Chest 2 View  05/30/2015  CLINICAL DATA:  Fall today with right-sided anterior pleuritic chest pain and rib pain. EXAM: CHEST - 2 VIEW COMPARISON:  04/11/2015 FINDINGS: Mildly displaced acute fracture is identified of the lateral right sixth rib. There may also be a fracture of the adjacent fifth rib. No pneumothorax identified. No edema or pleural fluid. The heart size and mediastinal contours are normal. IMPRESSION: At least one acute fracture is identified of the right sixth rib. There may be an adjacent fifth rib fracture. No pneumothorax identified. Electronically Signed   By: Irish Lack M.D.   On: 05/30/2015 22:51   Ct Head Wo Contrast  06/02/2015  CLINICAL DATA:  Larey Seat tonight hitting the right side of her head on the floor. EXAM: CT HEAD WITHOUT CONTRAST TECHNIQUE: Contiguous axial images were obtained from the base of the skull through the vertex without intravenous contrast. COMPARISON:  01/02/2009 FINDINGS: There is no intracranial hemorrhage or extra-axial fluid collection. There is moderate generalized atrophy. There is white matter hypodensity which is chronic and likely due to small vessel disease. No acute brain abnormality is evident. No interval change is evident except for mild worsening of generalized atrophy. There is a right frontal scalp hematoma. There is stranding opacity in the retro coloanal right orbit which is probably hemorrhage. There is mild proptosis on the right. Optic globe appears intact.  There are fractures of the left C1 arch and probably also the left occipital condyle. These are incompletely imaged at the bottom of this examination. There is an air-fluid level in the right maxillary sinus. There is air in the soft tissues lateral to the maxillary sinus, and there probably are minimally displaced fractures of the anterior and lateral right maxillary sinus walls. IMPRESSION: 1. Fracture of the left C1 arch and probably also the left occipital condyle, incompletely imaged. Recommend immobilization and C-spine CT. 2. New proptosis on the right. Stranding opacity behind the right optic globe may represent a retroconal hemorrhage. 3. Fractures of the anterior and lateral right maxillary sinus, mildly displaced. 4. No evidence of acute brain injury. 5. These results were called by telephone at the time of interpretation on 06/02/2015 at 12:27 am to Nurse Randa Evens, who verbally acknowledged these results. Electronically Signed   By: Ellery Plunk M.D.   On: 06/02/2015 00:31   Ct Chest Wo Contrast  05/31/2015  CLINICAL DATA:  Fall onto right-sided tonight with rib fracture on radiograph. EXAM: CT CHEST WITHOUT CONTRAST TECHNIQUE: Multidetector CT imaging of the chest was performed following the standard protocol without IV contrast. COMPARISON:  Chest radiograph yesterday.  Chest CT 10/18/2015 FINDINGS: Patient breathing motion artifact partially limits assessment. Minimally displaced fractures of anterior right fourth and lateral sixth ribs, with nondisplaced fracture of the anterior lateral right fifth and seventh ribs. No pneumothorax. Minimally displaced fracture of the right scapular tip. Cortical irregularity of the sternum with felt to be secondary to patient motion. Stable degenerative change in the thoracic spine without acute fracture. Stable Schmorl's node superior endplate of L1.  Emphysema. Dependent right lung base opacity, likely atelectasis, less likely pulmonary contusion, no subjacent  fracture of the lower ribs. Right upper lobe nodule image 34 series 2 measures 10 mm, unchanged. Nodule in the upper lobe measures 1.2 cm image 28 series 2, unchanged. Minimal right-sided pleural thickening without frank pleural effusion. Heart at the upper limits normal in size. Mitral annulus calcifications. Atherosclerotic calcifications of the thoracic aorta without periaortic soft tissue stranding or fluid. Calcified hilar and mediastinal lymph nodes. No evidence of adenopathy. No acute abnormality in the included upper abdomen. IMPRESSION: 1. Right fourth through seventh anterior lateral rib fractures, fourth and sixth fractures are minimally displaced. No pneumothorax. 2. Minimally displaced fracture of the right scapular tip. 3. Dependent right lower lobe opacity is favored to be atelectasis over pulmonary contusion. 4. Right lung nodules are unchanged from exam 6 weeks prior. Continued CT follow-up is recommended to evaluate for stability. Electronically Signed   By: Rubye Oaks M.D.   On: 05/31/2015 02:29   Ct Cervical Spine Wo Contrast  06/02/2015  CLINICAL DATA:  Larey Seat tonight.  Generalized neck pain. EXAM: CT CERVICAL SPINE WITHOUT CONTRAST TECHNIQUE: Multidetector CT imaging of the cervical spine was performed without intravenous contrast. Multiplanar CT image reconstructions were also generated. COMPARISON:  None. FINDINGS: There are mildly displaced fractures of the left anterior and posterior C1 arch. Nondisplaced fracture through the left occipital condyle is also present. No involvement of the foramen transversarium. The articulations of C1 with the occipital condyles and with C2 remain intact. Odontoid is intact. Remainder of the cervical vertebrae are intact. There is prior interbody fusion with anterior plate-screw fixation from C4 through C7, solidly ossified and intact. Central canal is widely patent, with no compromise from the C1 fracture. No acute soft tissue abnormality is evident.  Lung apices are markedly emphysematous. IMPRESSION: Acute mildly displaced fractures of the left half of C1 anteriorly and posteriorly, without compromise of the central canal. The fracture does not involve the foramen transversarium. Atlantoaxial articulation is intact. The articulations of C1 with the occipital condyles and with C2 are intact. There also is a nondisplaced fracture through the left occipital condyle. These results will be called to the ordering clinician or representative by the Radiologist Assistant, and communication documented in the PACS or zVision Dashboard. Electronically Signed   By: Ellery Plunk M.D.   On: 06/02/2015 01:35   Mr Laqueta Jean ZO Contrast  05/23/2015  CLINICAL DATA:  Dysarthria. EXAM: MRI HEAD WITHOUT AND WITH CONTRAST TECHNIQUE: Multiplanar, multiecho pulse sequences of the brain and surrounding structures were obtained without and with intravenous contrast. CONTRAST:  12mL MULTIHANCE GADOBENATE DIMEGLUMINE 529 MG/ML IV SOLN COMPARISON:  CT 01/02/2009 FINDINGS: Diffusion imaging does not show any acute or subacute infarction. The brain shows generalized atrophy. There chronic small-vessel ischemic changes of the pons. No focal cerebellar insult. The cerebral hemispheres show confluent chronic small vessel ischemic changes throughout the deep and subcortical white matter. Basal ganglia and thalami also show chronic small-vessel ischemic changes. No cortical or large vessel territory infarction. No mass lesion, hemorrhage, hydrocephalus or extra-axial collection. After contrast administration, no abnormal enhancement occurs. IMPRESSION: Atrophy and chronic small vessel ischemic changes throughout the brain. No acute or reversible finding. Electronically Signed   By: Paulina Fusi M.D.   On: 05/23/2015 18:32   Dg Chest Port 1 View  06/01/2015  CLINICAL DATA:  82 year old female status post fall with right fourth through seventh rib fractures. Initial encounter. EXAM:  PORTABLE CHEST 1  VIEW COMPARISON:  Chest CT 05/31/2015 and earlier. FINDINGS: Portable AP semi upright view at 0448 hours. Increased pulmonary vascularity bilaterally since 05/30/2015. Multilevel right lateral rib fractures again noted. Lower lung volumes. Stable cardiac size and mediastinal contours. No pneumothorax or pleural effusion. Mildly increased right hilar and infrahilar opacity. Calcified aortic atherosclerosis. Cervical ACDF hardware re- demonstrated. IMPRESSION: 1. Lower lung volumes. No pneumothorax or pleural effusion identified. 2. Acute pulmonary vascular congestion/mild interstitial edema. 3. Increased right hilar and lung base opacity, favor atelectasis. Electronically Signed   By: Odessa Fleming M.D.   On: 06/01/2015 07:17    Microbiology: No results found for this or any previous visit (from the past 240 hour(s)).   Labs: Basic Metabolic Panel:  Recent Labs Lab 05/31/15 0046 05/31/15 0447 06/01/15 0413 06/02/15 0722 06/04/15 0422  NA 142 140 144 139 143  K 4.0 4.4 4.2 3.4* 3.8  CL 108 107 112* 107 111  CO2 22 24 24 23 26   GLUCOSE 113* 119* 139* 105* 89  BUN 16 16 13 20 20   CREATININE 0.53 0.58 0.46 0.47 0.54  CALCIUM 9.2 9.2 8.7* 8.3* 8.3*   Liver Function Tests:  Recent Labs Lab 05/31/15 0046  AST 23  ALT 23  ALKPHOS 79  BILITOT 0.2*  PROT 7.3  ALBUMIN 4.1   No results for input(s): LIPASE, AMYLASE in the last 168 hours. No results for input(s): AMMONIA in the last 168 hours. CBC:  Recent Labs Lab 05/31/15 0046 05/31/15 0447 06/01/15 0413 06/02/15 0722 06/04/15 0422  WBC 10.3 8.4 5.4 8.8 6.6  NEUTROABS 8.3*  --   --   --   --   HGB 12.5 12.3 10.7* 10.7* 9.9*  HCT 38.1 36.7 32.1* 31.8* 29.5*  MCV 93.8 93.9 93.6 92.7 92.8  PLT 164 162 133* 128* 141*   Cardiac Enzymes: No results for input(s): CKTOTAL, CKMB, CKMBINDEX, TROPONINI in the last 168 hours. BNP: BNP (last 3 results) No results for input(s): BNP in the last 8760 hours.  ProBNP (last 3  results)  Recent Labs  04/11/15 1731  PROBNP 33.0    CBG:  Recent Labs Lab 05/31/15 0758 06/01/15 0810  GLUCAP 133* 126*       SignedCalvert Cantor, MD [facility] 06/05/2015, 9:43 AM

## 2015-06-05 NOTE — Progress Notes (Signed)
BP 183/77. NP on call notified. Patient refusing Hydralazine 10 mg IV order that was placed by NP on call. Patient stated she wanted to talk to her MD and take the BP medication she has at home. Will update oncoming nurse.

## 2015-06-05 NOTE — Clinical Social Work Placement (Signed)
   CLINICAL SOCIAL WORK PLACEMENT  NOTE  Date:  06/05/2015  Patient Details  Name: Karina Andrews MRN: [id number] Date of Birth: 1931/02/16  Clinical Social Work is seeking post-discharge placement for this patient at the Skilled  Nursing Facility level of care (*CSW will initial, date and re-position this form in  chart as items are completed):  Yes   Patient/family provided with [facility] Clinical Social Work Department's list of facilities offering this level of care within the geographic area requested by the patient (or if unable, by the patient's family).  Yes   Patient/family informed of their freedom to choose among providers that offer the needed level of care, that participate in Medicare, Medicaid or managed care program needed by the patient, have an available bed and are willing to accept the patient.  Yes   Patient/family informed of [facility]'s ownership interest in [facility] and [facility], as well as of the fact that they are under no obligation to receive care at these facilities.  PASRR submitted to EDS on       PASRR number received on       Existing PASRR number confirmed on 06/01/15     FL2 transmitted to all facilities in geographic area requested by pt/family on 06/01/15     FL2 transmitted to all facilities within larger geographic area on       Patient informed that his/her managed care company has contracts with or will negotiate with certain facilities, including the following:        Yes   Patient/family informed of bed offers received.  [facility]     Physician recommends and patient chooses bed at      Patient to be [facility] on 06/05/15.  Patient to be transferred to facility by ambulance Sharin Mons(PTAR)     Patient family notified on 06/05/15 of transfer.  Name of family member notified:  pt and pt daughter, [name] notified at bedside     PHYSICIAN Please sign FL2     Additional  Comment:    _______________________________________________ Orson EvaKIDD, SUZANNA A, LCSW 06/05/2015, 4:12 PM

## 2015-06-05 NOTE — Progress Notes (Signed)
Provided to pt and daughter at bedside a list for Home Health and Private Duty to pt and daughter. Pt was not nice at all. Stated, "[facility].  Are you happy!"

## 2015-06-05 NOTE — Progress Notes (Addendum)
Chaplain provided support with Carmell Austria (daughter) and patient at daughter's request.     Met with Carmell Austria independently and with other care providers and met with pt at bedside.    Carmell Austria is experiencing stress around discharge planning for her mother.  Ms Moulin wishes to discharge home, but Carmell Austria is concerned that her mother is not able to care for herself at home.    Met with Ms Byrd with goal of providing support, building rapport with care team and provide context where Ms. Cormany may be more aware of her own care needs.   During our time together, she was receptive of chaplain presence and concerned that she would make her own decisions about discharge plans.  Ms Pinkhasov was not aware of her nursing needs - stating she does not need oxygen at home or other assistance.  Stated if she were to go home and need more assistance, she could arrange this.  However, when chaplain asked whether others had spoken with her about the process of having nursing help come into her home, Ms Nudo moved to talk about in-home nursing help "robbing you blind."    She moved toward being dismissive of chaplain presence and fixating on removal of IV.

## 2015-06-05 NOTE — Progress Notes (Signed)
Pt for discharge today.  CSW spent and extensive amount of time today with pt and pt daughter, Carmell Austria to formulate discharge plan. Pt daughter experiencing a lot of stress surrounding discharge plan for pt as pt resistant to discharge to SNF and initially declining SNF today. Multiple medical staff met with pt today to provide support and discuss care concerns about pt returning home. Pt displays poor insight into her nursing needs at home.   CSW answered questions for pt regarding insurance coverage for SNF and provided pt with a document from [employer] with pt insurance coverage. Per [employer], pt insurance covers days 1-20 at 100%, but initial authorization is usually for seven days and then [employer] sends updated clinicals to facility for further authorization.   CSW involved RNCM as pt initially continued to decline SNF, but after discussion with RNCM, pt agreed to discharge to [facility].   CSW confirmed with [facility] that facility could accept pt today. CSW spoke with [facility] and received insurance authorization Josem Kaufmann #: [id number]).   CSW facilitated pt discharge needs including contacting facility, faxing pt discharge information via epic hub, discussing with pt and pt daughter at bedside, providing RN phone number to call report, and arranging ambulance transport for pt to [facility].   Pt irritable about transition to [facility], but agreed and [facility] transported pt to facility.   Alison Murray, MSW, [place] [facility] [phone]

## 2015-06-05 NOTE — Progress Notes (Signed)
Pt am blood pressure is 181/77.  Pt continuing to refuse medication.  States she just wants to go home.  Will continue to monitor closely.

## 2015-06-06 ENCOUNTER — Telehealth: Payer: Self-pay | Admitting: *Deleted

## 2015-06-06 NOTE — Telephone Encounter (Signed)
Pt was on tcm list admitted for fracture of ribs  (R) side. Pt was d/c 4/11 sent to SNF...Raechel Chute/lmb

## 2015-06-07 ENCOUNTER — Encounter: Payer: Self-pay | Admitting: Internal Medicine

## 2015-06-07 ENCOUNTER — Non-Acute Institutional Stay: Payer: Commercial Managed Care - HMO | Admitting: Internal Medicine

## 2015-06-07 DIAGNOSIS — F0391 Unspecified dementia with behavioral disturbance: Secondary | ICD-10-CM | POA: Diagnosis not present

## 2015-06-07 DIAGNOSIS — F32A Depression, unspecified: Secondary | ICD-10-CM

## 2015-06-07 DIAGNOSIS — S12000S Unspecified displaced fracture of first cervical vertebra, sequela: Secondary | ICD-10-CM | POA: Diagnosis not present

## 2015-06-07 DIAGNOSIS — S2241XS Multiple fractures of ribs, right side, sequela: Secondary | ICD-10-CM

## 2015-06-07 DIAGNOSIS — I10 Essential (primary) hypertension: Secondary | ICD-10-CM

## 2015-06-07 DIAGNOSIS — Z720 Tobacco use: Secondary | ICD-10-CM | POA: Diagnosis not present

## 2015-06-07 DIAGNOSIS — F329 Major depressive disorder, single episode, unspecified: Secondary | ICD-10-CM

## 2015-06-07 DIAGNOSIS — D649 Anemia, unspecified: Secondary | ICD-10-CM | POA: Diagnosis not present

## 2015-06-07 DIAGNOSIS — J449 Chronic obstructive pulmonary disease, unspecified: Secondary | ICD-10-CM | POA: Diagnosis not present

## 2015-06-07 DIAGNOSIS — R531 Weakness: Secondary | ICD-10-CM

## 2015-06-07 DIAGNOSIS — F03918 Unspecified dementia, unspecified severity, with other behavioral disturbance: Secondary | ICD-10-CM

## 2015-06-07 DIAGNOSIS — F17210 Nicotine dependence, cigarettes, uncomplicated: Secondary | ICD-10-CM

## 2015-06-07 DIAGNOSIS — S0240CS Maxillary fracture, right side, sequela: Secondary | ICD-10-CM | POA: Diagnosis not present

## 2015-06-07 NOTE — Progress Notes (Signed)
LOCATION: [facility]  PCP: Oliver Barre, MD   Code Status: Full Code  Goals of care: Advanced Directive information Advanced Directives 05/31/2015  Does patient have an advance directive? No  Type of Advance Directive -  Does patient want to make changes to advanced directive? -  Copy of advanced directive(s) in chart? -  Pre-existing out of facility DNR order (yellow form or pink MOST form) -    Extended Emergency Contact Information Primary Emergency Contact: Cuellar,Kristina Address: [address]          [place] Home Phone: [phone] Mobile Phone: [phone] Relation: Daughter   Allergies  Allergen Reactions  . Ativan [Lorazepam] Other (See Comments)    Causes increased confusion/AMS    Chief Complaint  Patient presents with  . New Admit To SNF    New Admission     HPI:  Patient is a 82 y.o. female seen today for short term rehabilitation post hospital admission from 05/30/15-06/05/15 post fall with right multiple rib and right scapula tip fracture. While in hospital, she underwent alcohol withdrawal and had another fall with c1 fracture, right orbital laceration and right maxillary fracture and right eye injury. Her laceration was sutured. She was seen by neurosurgery and cervical collar has been recommended all the time. She was seen by ophthalmology and warm compresses and artifical tear were recommended. She was started on seroquel for her confusion. She was also treated for copd exacerbation this admission. She is seen in her room today.   Review of Systems:  Constitutional: Negative for fever, chills, diaphoresis.  HENT: Negative for headache, congestion, nasal discharge, difficulty swallowing.   Eyes: Negative for blurred vision, double vision and discharge. Wears glasses.  Respiratory: Negative for shortness of breath and wheezing. Positive for cough with white phlegm.   Cardiovascular: Negative for chest pain,  palpitations, leg swelling.  Gastrointestinal: Negative for heartburn, nausea, vomiting, abdominal pain. Last bowel movement was today  Genitourinary: Negative for dysuria and flank pain.  Musculoskeletal: Negative for back pain, fall.  Skin: Negative for itching, rash.  Neurological: Negative for dizziness. Psychiatric/Behavioral: Negative for depression   Past Medical History  Diagnosis Date  . Hypertension   . Depression   . Osteoporosis     (R) hip fx 11/2009 & (L) hip fx 2006  . COPD (chronic obstructive pulmonary disease) (HCC)   . Cervical spondylosis with myelopathy 01/2009    s/p decompression  . Aortic stenosis     moderate on echo 09/2013  . Alcohol abuse    Past Surgical History  Procedure Laterality Date  . Bladder tuck    . Cholecystectomy  1995  . Posterior laminectomy / decompression cervical spine  01/2009    Done by Dr. Danielle Dess  . Right hip  11/2009    ORIF/ Done by Dr. Turner Daniels  . Cataract extraction Right 06/06/13   Social History:   reports that she has been smoking Cigarettes.  She has a 26.5 pack-year smoking history. She has never used smokeless tobacco. She reports that she drinks alcohol. She reports that she does not use illicit drugs.  Family History  Problem Relation Age of Onset  . Heart attack Father     Medications:   Medication List       This list is accurate as of: 06/07/15  3:18 PM.  Always use your most recent med list.               albuterol  108 (90 Base) MCG/ACT inhaler  Commonly known as:  VENTOLIN HFA  Inhale 2 puffs into the lungs every 6 (six) hours as needed for wheezing or shortness of breath.     bacitracin-polymyxin b ophthalmic ointment  Commonly known as:  POLYSPORIN  Place into the right eye 3 (three) times daily. apply to eye every 12 hours while awake     benzonatate 100 MG capsule  Commonly known as:  TESSALON  Take 1 capsule (100 mg total) by mouth 3 (three) times daily.     budesonide-formoterol 160-4.5  MCG/ACT inhaler  Commonly known as:  SYMBICORT  Inhale 2 puffs into the lungs 2 (two) times daily.     guaiFENesin 600 MG 12 hr tablet  Commonly known as:  MUCINEX  Take 1 tablet (600 mg total) by mouth 2 (two) times daily.     HYDROcodone-acetaminophen 5-325 MG tablet  Commonly known as:  NORCO/VICODIN  Take 1 tablet by mouth every 6 (six) hours as needed for moderate pain.     ipratropium-albuterol 0.5-2.5 (3) MG/3ML Soln  Commonly known as:  DUONEB  Take 3 mLs by nebulization every 6 (six) hours as needed (if inhaler does not resolve wheezing).     losartan 50 MG tablet  Commonly known as:  COZAAR  Take 1 tablet (50 mg total) by mouth daily.     multivitamin with minerals Tabs tablet  Take 1 tablet by mouth daily.     nicotine 21 mg/24hr patch  Commonly known as:  NICODERM CQ - dosed in mg/24 hours  Place 1 patch (21 mg total) onto the skin daily. Decrease to 14 mg in 3 days     polyvinyl alcohol 1.4 % ophthalmic solution  Commonly known as:  LIQUIFILM TEARS  Place 1 drop into the right eye as needed for dry eyes.     QUEtiapine 25 MG tablet  Commonly known as:  SEROQUEL  Take 2 tablets (50 mg total) by mouth at bedtime.     rosuvastatin 10 MG tablet  Commonly known as:  CRESTOR  Take 1 tablet (10 mg total) by mouth daily.     tiotropium 18 MCG inhalation capsule  Commonly known as:  SPIRIVA  Place 18 mcg into inhaler and inhale daily.        Immunizations: Immunization History  Administered Date(s) Administered  . Influenza Split 11/03/2011, 11/17/2013, 11/25/2014  . PPD Test 06/06/2015  . Pneumococcal Polysaccharide-23 09/17/2013     Physical Exam: Filed Vitals:   06/07/15 1511  BP: 150/80  Pulse: 85  Temp: 98.3 F (36.8 C)  TempSrc: Oral  Resp: 18  SpO2: 98%   General- elderly female, frail and thin built, in no acute distress Head- normocephalic, atraumatic Nose- no maxillary or frontal sinus tenderness, no nasal discharge Throat- moist  mucus membrane Eyes- PERRLA, EOMI, no pallor, no icterus, hemorrhage to right conjunctiva, normal sclera Neck- cervical collar in place Cardiovascular- normal s1,s2, + murmur, no leg edema Respiratory- poor air movement bilaterally, on o2 Abdomen- bowel sounds present, soft, non tender Musculoskeletal- able to move all 4 extremities, generalized weakness Neurological- alert and oriented to person, place and time Skin- warm and dry, bruise to right side of her face, laceration above her right eye with sutures, bruising to left arm, skin tear to right forearm Psychiatry- normal mood and affect    Labs reviewed: Basic Metabolic Panel:  Recent Labs  16/10/96 0413 06/02/15 0722 06/04/15 06/04/15 0422  NA 144 139 143 143  K  4.2 3.4*  --  3.8  CL 112* 107  --  111  CO2 24 23  --  26  GLUCOSE 139* 105*  --  89  BUN 13 20 20 20   CREATININE 0.46 0.47 0.5 0.54  CALCIUM 8.7* 8.3*  --  8.3*   Liver Function Tests:  Recent Labs  04/11/15 1731 05/31/15 0046  AST 22 23  ALT 20 23  ALKPHOS 74 79  BILITOT 0.4 0.2*  PROT 7.6 7.3  ALBUMIN 4.3 4.1   No results for input(s): LIPASE, AMYLASE in the last 8760 hours. No results for input(s): AMMONIA in the last 8760 hours. CBC:  Recent Labs  04/11/15 1731 05/31/15 0046  06/01/15 0413 06/02/15 0722 06/04/15 06/04/15 0422  WBC 6.6 10.3  < > 5.4 8.8 6.6 6.6  NEUTROABS 4.3 8.3*  --   --   --   --   --   HGB 13.9 12.5  < > 10.7* 10.7*  --  9.9*  HCT 41.1 38.1  < > 32.1* 31.8*  --  29.5*  MCV 91.6 93.8  < > 93.6 92.7  --  92.8  PLT 180.0 164  < > 133* 128*  --  141*  < > = values in this interval not displayed. Cardiac Enzymes: No results for input(s): CKTOTAL, CKMB, CKMBINDEX, TROPONINI in the last 8760 hours. BNP: Invalid input(s): POCBNP CBG:  Recent Labs  05/31/15 0758 06/01/15 0810  GLUCAP 133* 126*    Radiological Exams: Dg Chest 2 View  06/02/2015  CLINICAL DATA:  Re-evaluate right rib fractures post new fall  tonight in her hospital room. EXAM: CHEST  2 VIEW COMPARISON:  Radiograph yesterday. FINDINGS: The fourth through seventh right lateral rib fractures are unchanged in alignment. No evidence of new fracture since prior exam. No pneumothorax. Cardiomediastinal contours are unchanged. Decreased vascular congestion from prior. Unchanged right lung base atelectasis. No definite pleural effusion. IMPRESSION: 1. Unchanged right rib fractures. 2. Unchanged right basilar atelectasis. 3. Decreased vascular congestion.  No new abnormality is seen. Electronically Signed   By: Rubye Oaks M.D.   On: 06/02/2015 02:41   Dg Chest 2 View  05/30/2015  CLINICAL DATA:  Fall today with right-sided anterior pleuritic chest pain and rib pain. EXAM: CHEST - 2 VIEW COMPARISON:  04/11/2015 FINDINGS: Mildly displaced acute fracture is identified of the lateral right sixth rib. There may also be a fracture of the adjacent fifth rib. No pneumothorax identified. No edema or pleural fluid. The heart size and mediastinal contours are normal. IMPRESSION: At least one acute fracture is identified of the right sixth rib. There may be an adjacent fifth rib fracture. No pneumothorax identified. Electronically Signed   By: Irish Lack M.D.   On: 05/30/2015 22:51   Ct Head Wo Contrast  06/02/2015  CLINICAL DATA:  Larey Seat tonight hitting the right side of her head on the floor. EXAM: CT HEAD WITHOUT CONTRAST TECHNIQUE: Contiguous axial images were obtained from the base of the skull through the vertex without intravenous contrast. COMPARISON:  01/02/2009 FINDINGS: There is no intracranial hemorrhage or extra-axial fluid collection. There is moderate generalized atrophy. There is white matter hypodensity which is chronic and likely due to small vessel disease. No acute brain abnormality is evident. No interval change is evident except for mild worsening of generalized atrophy. There is a right frontal scalp hematoma. There is stranding opacity in  the retro coloanal right orbit which is probably hemorrhage. There is mild proptosis on the right.  Optic globe appears intact. There are fractures of the left C1 arch and probably also the left occipital condyle. These are incompletely imaged at the bottom of this examination. There is an air-fluid level in the right maxillary sinus. There is air in the soft tissues lateral to the maxillary sinus, and there probably are minimally displaced fractures of the anterior and lateral right maxillary sinus walls. IMPRESSION: 1. Fracture of the left C1 arch and probably also the left occipital condyle, incompletely imaged. Recommend immobilization and C-spine CT. 2. New proptosis on the right. Stranding opacity behind the right optic globe may represent a retroconal hemorrhage. 3. Fractures of the anterior and lateral right maxillary sinus, mildly displaced. 4. No evidence of acute brain injury. 5. These results were called by telephone at the time of interpretation on 06/02/2015 at 12:27 am to Nurse Randa EvensJoAnne, who verbally acknowledged these results. Electronically Signed   By: Ellery Plunkaniel R Mitchell M.D.   On: 06/02/2015 00:31   Ct Chest Wo Contrast  05/31/2015  CLINICAL DATA:  Fall onto right-sided tonight with rib fracture on radiograph. EXAM: CT CHEST WITHOUT CONTRAST TECHNIQUE: Multidetector CT imaging of the chest was performed following the standard protocol without IV contrast. COMPARISON:  Chest radiograph yesterday.  Chest CT 10/18/2015 FINDINGS: Patient breathing motion artifact partially limits assessment. Minimally displaced fractures of anterior right fourth and lateral sixth ribs, with nondisplaced fracture of the anterior lateral right fifth and seventh ribs. No pneumothorax. Minimally displaced fracture of the right scapular tip. Cortical irregularity of the sternum with felt to be secondary to patient motion. Stable degenerative change in the thoracic spine without acute fracture. Stable Schmorl's node superior  endplate of L1. Emphysema. Dependent right lung base opacity, likely atelectasis, less likely pulmonary contusion, no subjacent fracture of the lower ribs. Right upper lobe nodule image 34 series 2 measures 10 mm, unchanged. Nodule in the upper lobe measures 1.2 cm image 28 series 2, unchanged. Minimal right-sided pleural thickening without frank pleural effusion. Heart at the upper limits normal in size. Mitral annulus calcifications. Atherosclerotic calcifications of the thoracic aorta without periaortic soft tissue stranding or fluid. Calcified hilar and mediastinal lymph nodes. No evidence of adenopathy. No acute abnormality in the included upper abdomen. IMPRESSION: 1. Right fourth through seventh anterior lateral rib fractures, fourth and sixth fractures are minimally displaced. No pneumothorax. 2. Minimally displaced fracture of the right scapular tip. 3. Dependent right lower lobe opacity is favored to be atelectasis over pulmonary contusion. 4. Right lung nodules are unchanged from exam 6 weeks prior. Continued CT follow-up is recommended to evaluate for stability. Electronically Signed   By: Rubye OaksMelanie  Ehinger M.D.   On: 05/31/2015 02:29   Ct Cervical Spine Wo Contrast  06/02/2015  CLINICAL DATA:  Larey SeatFell tonight.  Generalized neck pain. EXAM: CT CERVICAL SPINE WITHOUT CONTRAST TECHNIQUE: Multidetector CT imaging of the cervical spine was performed without intravenous contrast. Multiplanar CT image reconstructions were also generated. COMPARISON:  None. FINDINGS: There are mildly displaced fractures of the left anterior and posterior C1 arch. Nondisplaced fracture through the left occipital condyle is also present. No involvement of the foramen transversarium. The articulations of C1 with the occipital condyles and with C2 remain intact. Odontoid is intact. Remainder of the cervical vertebrae are intact. There is prior interbody fusion with anterior plate-screw fixation from C4 through C7, solidly ossified  and intact. Central canal is widely patent, with no compromise from the C1 fracture. No acute soft tissue abnormality is evident. Lung apices are markedly  emphysematous. IMPRESSION: Acute mildly displaced fractures of the left half of C1 anteriorly and posteriorly, without compromise of the central canal. The fracture does not involve the foramen transversarium. Atlantoaxial articulation is intact. The articulations of C1 with the occipital condyles and with C2 are intact. There also is a nondisplaced fracture through the left occipital condyle. These results will be called to the ordering clinician or representative by the Radiologist Assistant, and communication documented in the PACS or zVision Dashboard. Electronically Signed   By: Ellery Plunk M.D.   On: 06/02/2015 01:35   Mr Laqueta Jean NW Contrast  05/23/2015  CLINICAL DATA:  Dysarthria. EXAM: MRI HEAD WITHOUT AND WITH CONTRAST TECHNIQUE: Multiplanar, multiecho pulse sequences of the brain and surrounding structures were obtained without and with intravenous contrast. CONTRAST:  12mL MULTIHANCE GADOBENATE DIMEGLUMINE 529 MG/ML IV SOLN COMPARISON:  CT 01/02/2009 FINDINGS: Diffusion imaging does not show any acute or subacute infarction. The brain shows generalized atrophy. There chronic small-vessel ischemic changes of the pons. No focal cerebellar insult. The cerebral hemispheres show confluent chronic small vessel ischemic changes throughout the deep and subcortical white matter. Basal ganglia and thalami also show chronic small-vessel ischemic changes. No cortical or large vessel territory infarction. No mass lesion, hemorrhage, hydrocephalus or extra-axial collection. After contrast administration, no abnormal enhancement occurs. IMPRESSION: Atrophy and chronic small vessel ischemic changes throughout the brain. No acute or reversible finding. Electronically Signed   By: Paulina Fusi M.D.   On: 05/23/2015 18:32   Dg Chest Port 1 View  06/01/2015   CLINICAL DATA:  82 year old female status post fall with right fourth through seventh rib fractures. Initial encounter. EXAM: PORTABLE CHEST 1 VIEW COMPARISON:  Chest CT 05/31/2015 and earlier. FINDINGS: Portable AP semi upright view at 0448 hours. Increased pulmonary vascularity bilaterally since 05/30/2015. Multilevel right lateral rib fractures again noted. Lower lung volumes. Stable cardiac size and mediastinal contours. No pneumothorax or pleural effusion. Mildly increased right hilar and infrahilar opacity. Calcified aortic atherosclerosis. Cervical ACDF hardware re- demonstrated. IMPRESSION: 1. Lower lung volumes. No pneumothorax or pleural effusion identified. 2. Acute pulmonary vascular congestion/mild interstitial edema. 3. Increased right hilar and lung base opacity, favor atelectasis. Electronically Signed   By: Odessa Fleming M.D.   On: 06/01/2015 07:17    Assessment/Plan  Generalized weakness Will have her work with physical therapy and occupational therapy team to help with gait training and muscle strengthening exercises.fall precautions. Skin care. Encourage to be out of bed.   Closed c1 fracture Continue neck collar all the time and to follow with neurosurgery. To work with therapy team. Continue noroc 5-325 mg q6h prn pain  Right orbital laceration With suture in place, denies new vision problem. Suture removal on 06/09/15. Continue skin care  Right maxillary fracture Monitor clinically, continue pain management. Follow up with ENT  Rib fracture Post fall. Continue norco as above as needed for pain  Anemia Monitor cbc  Copd Not on o2 at home, wean off o2 as tolerated, continue her bronchodilators and mucinex  HTN Monitor bp reading, continue losartan  Tobacco use Continue nicotine  HLD Continue crestor  Dementia with behavioral disturbance Stable mood, on seroquel, monitor clinically. Supportive care     Goals of care: short term rehabilitation   Labs/tests  ordered: cbc, cmp 06/11/15  Family/ staff Communication: reviewed care plan with patient and nursing supervisor    Oneal Grout, MD Internal Medicine [facility] [address] [place], [place] [address] Cell  Phone (Monday-Friday 8 am - 5 pm): [phone] On Call: [phone] and follow prompts after 5 pm and on weekends Office Phone: [phone] Office Fax: [phone]

## 2015-06-11 LAB — BASIC METABOLIC PANEL
BUN: 19 mg/dL (ref 4–21)
Creatinine: 0.7 mg/dL (ref 0.5–1.1)
GLUCOSE: 131 mg/dL
Potassium: 4.3 mmol/L (ref 3.4–5.3)
Sodium: 144 mmol/L (ref 137–147)

## 2015-06-11 LAB — CBC AND DIFFERENTIAL
HEMATOCRIT: 35 % — AB (ref 36–46)
HEMOGLOBIN: 11.2 g/dL — AB (ref 12.0–16.0)
Platelets: 223 10*3/uL (ref 150–399)
WBC: 8.5 10*3/mL

## 2015-06-14 ENCOUNTER — Non-Acute Institutional Stay (SKILLED_NURSING_FACILITY): Payer: Commercial Managed Care - HMO | Admitting: Family

## 2015-06-14 ENCOUNTER — Encounter: Payer: Self-pay | Admitting: Family

## 2015-06-14 DIAGNOSIS — S0541XD Penetrating wound of orbit with or without foreign body, right eye, subsequent encounter: Secondary | ICD-10-CM

## 2015-06-14 NOTE — Progress Notes (Signed)
Patient ID: Karina Andrews, female   DOB: Oct 14, 1930, 82 y.o.   MRN: [id number]  Location:  [facility] Room Number: 1107 Place of Service:  SNF (31) Provider:  Timmie Foersterianh Ross Hefferan, FNP-C Oneal GroutMahima Pandey, MD  Oliver BarreJames John, MD  Patient Care Team: Corwin LevinsJames W John, MD as PCP - General (Internal Medicine) Barbaraann ShareKeith M Clance, MD as Consulting Physician (Pulmonary Disease) Gean BirchwoodFrank Rowan, MD as Consulting Physician (Orthopedic Surgery) Barnett AbuHenry Elsner, MD as Consulting Physician (Neurosurgery)  Extended Emergency Contact Information Primary Emergency Contact: Sandi Mariscaluellar,Kristina Address: [address]          [place], [place] Home Phone: [phone] Mobile Phone: [phone] Relation: Daughter  Code Status: Full Code  Goals of care: Advanced Directive information Advanced Directives 05/31/2015  Does patient have an advance directive? No  Type of Advance Directive -  Does patient want to make changes to advanced directive? -  Copy of advanced directive(s) in chart? -  Pre-existing out of facility DNR order (yellow form or pink MOST form) -     Chief Complaint  Patient presents with  . Acute Visit    Acute Concerns     HPI:  Pt is a 82 y.o. female [facility]  for an acute visit for evaluation of right orbital sutures. She is here for STR post hospital admission from 05/30/15-06/05/15 post fall with right multiple rib and right scapula tip fracture.She also sustained a fall during the hospital admission due to alcohol withdrawal resulting to  C1 fracture, right orbital laceration and right maxillary fracture and right eye injury. Her laceration was sutured.she is seen in her room today. She denies any pain on right orbital, fever or chills.    Past Medical History  Diagnosis Date  . Hypertension   . Depression   . Osteoporosis     (R) hip fx 11/2009 & (L) hip fx 2006  . COPD (chronic obstructive pulmonary  disease) (HCC)   . Cervical spondylosis with myelopathy 01/2009    s/p decompression  . Aortic stenosis     moderate on echo 09/2013  . Alcohol abuse    Past Surgical History  Procedure Laterality Date  . Bladder tuck    . Cholecystectomy  1995  . Posterior laminectomy / decompression cervical spine  01/2009    Done by Dr. Danielle DessElsner  . Right hip  11/2009    ORIF/ Done by Dr. Turner Danielsowan  . Cataract extraction Right 06/06/13    Allergies  Allergen Reactions  . Ativan [Lorazepam] Other (See Comments)    Causes increased confusion/AMS      Medication List       This list is accurate as of: 06/14/15  3:20 PM.  Always use your most recent med list.               albuterol 108 (90 Base) MCG/ACT inhaler  Commonly known as:  VENTOLIN HFA  Inhale 2 puffs into the lungs every 6 (six) hours as needed for wheezing or shortness of breath.     bacitracin-polymyxin b ophthalmic ointment  Commonly known as:  POLYSPORIN  Place 1 application into the right eye every 12 (twelve) hours. apply to eye every 12 hours while awake     budesonide-formoterol 160-4.5 MCG/ACT inhaler  Commonly known as:  SYMBICORT  Inhale 2 puffs into the lungs 2 (two) times daily.     guaiFENesin 600 MG 12 hr tablet  Commonly known  as:  MUCINEX  Take 1 tablet (600 mg total) by mouth 2 (two) times daily.     HYDROcodone-acetaminophen 5-325 MG tablet  Commonly known as:  NORCO/VICODIN  Take 1 tablet by mouth every 6 (six) hours as needed for moderate pain.     hydrOXYzine 50 MG tablet  Commonly known as:  ATARAX/VISTARIL  1/2 tablet by mouth three times daily as needed for anxiety     ipratropium-albuterol 0.5-2.5 (3) MG/3ML Soln  Commonly known as:  DUONEB  Take 3 mLs by nebulization every 6 (six) hours as needed (if inhaler does not resolve wheezing).     losartan 50 MG tablet  Commonly known as:  COZAAR  Take 1 tablet (50 mg total) by mouth daily.     multivitamin with minerals Tabs tablet  Take 1 tablet  by mouth daily.     nicotine 14 mg/24hr patch  Commonly known as:  NICODERM CQ - dosed in mg/24 hours  Place 14 mg onto the skin daily.     polyvinyl alcohol 1.4 % ophthalmic solution  Commonly known as:  LIQUIFILM TEARS  Place 1 drop into the right eye as needed for dry eyes.     rosuvastatin 10 MG tablet  Commonly known as:  CRESTOR  Take 1 tablet (10 mg total) by mouth daily.     tiotropium 18 MCG inhalation capsule  Commonly known as:  SPIRIVA  Place 18 mcg into inhaler and inhale daily.        Review of Systems  Constitutional: Negative for fever, chills, activity change, appetite change and fatigue.  HENT: Negative for congestion, rhinorrhea, sinus pressure, sneezing and sore throat.   Eyes: Negative for pain, discharge and redness.  Respiratory: Negative for cough, chest tightness, shortness of breath and wheezing.   Cardiovascular: Negative for chest pain, palpitations and leg swelling.  Musculoskeletal: Positive for gait problem.       Wears Hard collar   Skin:       Right orbital sutures.   Psychiatric/Behavioral: Negative.     Immunization History  Administered Date(s) Administered  . Influenza Split 11/03/2011, 11/17/2013, 11/25/2014  . PPD Test 06/06/2015  . Pneumococcal Polysaccharide-23 09/17/2013   Pertinent  Health Maintenance Due  Topic Date Due  . PNA vac Low Risk Adult (2 of 2 - PCV13) 09/18/2014  . INFLUENZA VACCINE  09/25/2015  . DEXA SCAN  Completed   Fall Risk  04/11/2015  Falls in the past year? No   Functional Status Survey:    Filed Vitals:   06/14/15 1519  BP: 124/72  Pulse: 72  Temp: 98.9 F (37.2 C)  Resp: 18   There is no weight on file to calculate BMI. Physical Exam  Constitutional: She appears well-developed and well-nourished. No distress.  HENT:  Head: Normocephalic.  Mouth/Throat: Oropharynx is clear and moist.  Right orbital grape size old hematoma non-tender to touch without any signs of infections. Sutures to  eyebrow dry, clean and intact.   Neck:  Neck hard collar in place.   Pulmonary/Chest: Effort normal and breath sounds normal. No respiratory distress. She has no wheezes. She has no rales.  Oxygen 2 Liters via Nasal cannula   Abdominal: Soft. Bowel sounds are normal. She exhibits no distension. There is no tenderness. There is no rebound and no guarding.  Neurological: She is alert.  Skin: Skin is warm and dry. No rash noted. No erythema. No pallor.  Right eyebrow sutures intact site without any redness or drainage.  Psychiatric: She has a normal mood and affect.    Labs reviewed:  Recent Labs  06/01/15 0413 06/02/15 0722 06/04/15 06/04/15 0422  NA 144 139 143 143  K 4.2 3.4*  --  3.8  CL 112* 107  --  111  CO2 24 23  --  26  GLUCOSE 139* 105*  --  89  BUN CREATININE 0.46 0.47 0.5 0.54  CALCIUM 8.7* 8.3*  --  8.3*    Recent Labs  04/11/15 1731 05/31/15 0046  AST 22 23  ALT 20 23  ALKPHOS 74 79  BILITOT 0.4 0.2*  PROT 7.6 7.3  ALBUMIN 4.3 4.1    Recent Labs  04/11/15 1731 05/31/15 0046  06/01/15 0413 06/02/15 0722 06/04/15 06/04/15 0422  WBC 6.6 10.3  < > 5.4 8.8 6.6 6.6  NEUTROABS 4.3 8.3*  --   --   --   --   --   HGB 13.9 12.5  < > 10.7* 10.7*  --  9.9*  HCT 41.1 38.1  < > 32.1* 31.8*  --  29.5*  MCV 91.6 93.8  < > 93.6 92.7  --  92.8  PLT 180.0 164  < > 133* 128*  --  141*  < > = values in this interval not displayed. Lab Results  Component Value Date   TSH 1.92 04/11/2015   No results found for: HGBA1C Lab Results  Component Value Date   CHOL 260* 04/11/2015   HDL 92.50 04/11/2015   LDLCALC 148* 04/11/2015   LDLDIRECT 189.2 12/12/2008   TRIG 98.0 04/11/2015   CHOLHDL 3 04/11/2015    Significant Diagnostic Results in last 30 days:  Dg Chest 2 View  06/02/2015  CLINICAL DATA:  Re-evaluate right rib fractures post new fall tonight in her hospital room. EXAM: CHEST  2 VIEW COMPARISON:  Radiograph yesterday. FINDINGS: The fourth  through seventh right lateral rib fractures are unchanged in alignment. No evidence of new fracture since prior exam. No pneumothorax. Cardiomediastinal contours are unchanged. Decreased vascular congestion from prior. Unchanged right lung base atelectasis. No definite pleural effusion. IMPRESSION: 1. Unchanged right rib fractures. 2. Unchanged right basilar atelectasis. 3. Decreased vascular congestion.  No new abnormality is seen. Electronically Signed   By: Rubye Oaks M.D.   On: 06/02/2015 02:41   Dg Chest 2 View  05/30/2015  CLINICAL DATA:  Fall today with right-sided anterior pleuritic chest pain and rib pain. EXAM: CHEST - 2 VIEW COMPARISON:  04/11/2015 FINDINGS: Mildly displaced acute fracture is identified of the lateral right sixth rib. There may also be a fracture of the adjacent fifth rib. No pneumothorax identified. No edema or pleural fluid. The heart size and mediastinal contours are normal. IMPRESSION: At least one acute fracture is identified of the right sixth rib. There may be an adjacent fifth rib fracture. No pneumothorax identified. Electronically Signed   By: Irish Lack M.D.   On: 05/30/2015 22:51   Ct Head Wo Contrast  06/02/2015  CLINICAL DATA:  Larey Seat tonight hitting the right side of her head on the floor. EXAM: CT HEAD WITHOUT CONTRAST TECHNIQUE: Contiguous axial images were obtained from the base of the skull through the vertex without intravenous contrast. COMPARISON:  01/02/2009 FINDINGS: There is no intracranial hemorrhage or extra-axial fluid collection. There is moderate generalized atrophy. There is white matter hypodensity which is chronic and likely due to small vessel disease. No acute brain abnormality is evident. No interval change is evident except  for mild worsening of generalized atrophy. There is a right frontal scalp hematoma. There is stranding opacity in the retro coloanal right orbit which is probably hemorrhage. There is mild proptosis on the right. Optic  globe appears intact. There are fractures of the left C1 arch and probably also the left occipital condyle. These are incompletely imaged at the bottom of this examination. There is an air-fluid level in the right maxillary sinus. There is air in the soft tissues lateral to the maxillary sinus, and there probably are minimally displaced fractures of the anterior and lateral right maxillary sinus walls. IMPRESSION: 1. Fracture of the left C1 arch and probably also the left occipital condyle, incompletely imaged. Recommend immobilization and C-spine CT. 2. New proptosis on the right. Stranding opacity behind the right optic globe may represent a retroconal hemorrhage. 3. Fractures of the anterior and lateral right maxillary sinus, mildly displaced. 4. No evidence of acute brain injury. 5. These results were called by telephone at the time of interpretation on 06/02/2015 at 12:27 am to Nurse Randa Evens, who verbally acknowledged these results. Electronically Signed   By: Ellery Plunk M.D.   On: 06/02/2015 00:31   Ct Chest Wo Contrast  05/31/2015  CLINICAL DATA:  Fall onto right-sided tonight with rib fracture on radiograph. EXAM: CT CHEST WITHOUT CONTRAST TECHNIQUE: Multidetector CT imaging of the chest was performed following the standard protocol without IV contrast. COMPARISON:  Chest radiograph yesterday.  Chest CT 10/18/2015 FINDINGS: Patient breathing motion artifact partially limits assessment. Minimally displaced fractures of anterior right fourth and lateral sixth ribs, with nondisplaced fracture of the anterior lateral right fifth and seventh ribs. No pneumothorax. Minimally displaced fracture of the right scapular tip. Cortical irregularity of the sternum with felt to be secondary to patient motion. Stable degenerative change in the thoracic spine without acute fracture. Stable Schmorl's node superior endplate of L1. Emphysema. Dependent right lung base opacity, likely atelectasis, less likely pulmonary  contusion, no subjacent fracture of the lower ribs. Right upper lobe nodule image 34 series 2 measures 10 mm, unchanged. Nodule in the upper lobe measures 1.2 cm image 28 series 2, unchanged. Minimal right-sided pleural thickening without frank pleural effusion. Heart at the upper limits normal in size. Mitral annulus calcifications. Atherosclerotic calcifications of the thoracic aorta without periaortic soft tissue stranding or fluid. Calcified hilar and mediastinal lymph nodes. No evidence of adenopathy. No acute abnormality in the included upper abdomen. IMPRESSION: 1. Right fourth through seventh anterior lateral rib fractures, fourth and sixth fractures are minimally displaced. No pneumothorax. 2. Minimally displaced fracture of the right scapular tip. 3. Dependent right lower lobe opacity is favored to be atelectasis over pulmonary contusion. 4. Right lung nodules are unchanged from exam 6 weeks prior. Continued CT follow-up is recommended to evaluate for stability. Electronically Signed   By: Rubye Oaks M.D.   On: 05/31/2015 02:29   Ct Cervical Spine Wo Contrast  06/02/2015  CLINICAL DATA:  Larey Seat tonight.  Generalized neck pain. EXAM: CT CERVICAL SPINE WITHOUT CONTRAST TECHNIQUE: Multidetector CT imaging of the cervical spine was performed without intravenous contrast. Multiplanar CT image reconstructions were also generated. COMPARISON:  None. FINDINGS: There are mildly displaced fractures of the left anterior and posterior C1 arch. Nondisplaced fracture through the left occipital condyle is also present. No involvement of the foramen transversarium. The articulations of C1 with the occipital condyles and with C2 remain intact. Odontoid is intact. Remainder of the cervical vertebrae are intact. There is prior interbody fusion with  anterior plate-screw fixation from C4 through C7, solidly ossified and intact. Central canal is widely patent, with no compromise from the C1 fracture. No acute soft tissue  abnormality is evident. Lung apices are markedly emphysematous. IMPRESSION: Acute mildly displaced fractures of the left half of C1 anteriorly and posteriorly, without compromise of the central canal. The fracture does not involve the foramen transversarium. Atlantoaxial articulation is intact. The articulations of C1 with the occipital condyles and with C2 are intact. There also is a nondisplaced fracture through the left occipital condyle. These results will be called to the ordering clinician or representative by the Radiologist Assistant, and communication documented in the PACS or zVision Dashboard. Electronically Signed   By: Ellery Plunk M.D.   On: 06/02/2015 01:35   Mr Laqueta Jean ZO Contrast  05/23/2015  CLINICAL DATA:  Dysarthria. EXAM: MRI HEAD WITHOUT AND WITH CONTRAST TECHNIQUE: Multiplanar, multiecho pulse sequences of the brain and surrounding structures were obtained without and with intravenous contrast. CONTRAST:  12mL MULTIHANCE GADOBENATE DIMEGLUMINE 529 MG/ML IV SOLN COMPARISON:  CT 01/02/2009 FINDINGS: Diffusion imaging does not show any acute or subacute infarction. The brain shows generalized atrophy. There chronic small-vessel ischemic changes of the pons. No focal cerebellar insult. The cerebral hemispheres show confluent chronic small vessel ischemic changes throughout the deep and subcortical white matter. Basal ganglia and thalami also show chronic small-vessel ischemic changes. No cortical or large vessel territory infarction. No mass lesion, hemorrhage, hydrocephalus or extra-axial collection. After contrast administration, no abnormal enhancement occurs. IMPRESSION: Atrophy and chronic small vessel ischemic changes throughout the brain. No acute or reversible finding. Electronically Signed   By: Paulina Fusi M.D.   On: 05/23/2015 18:32   Dg Chest Port 1 View  06/01/2015  CLINICAL DATA:  82 year old female status post fall with right fourth through seventh rib fractures. Initial  encounter. EXAM: PORTABLE CHEST 1 VIEW COMPARISON:  Chest CT 05/31/2015 and earlier. FINDINGS: Portable AP semi upright view at 0448 hours. Increased pulmonary vascularity bilaterally since 05/30/2015. Multilevel right lateral rib fractures again noted. Lower lung volumes. Stable cardiac size and mediastinal contours. No pneumothorax or pleural effusion. Mildly increased right hilar and infrahilar opacity. Calcified aortic atherosclerosis. Cervical ACDF hardware re- demonstrated. IMPRESSION: 1. Lower lung volumes. No pneumothorax or pleural effusion identified. 2. Acute pulmonary vascular congestion/mild interstitial edema. 3. Increased right hilar and lung base opacity, favor atelectasis. Electronically Signed   By: Odessa Fleming M.D.   On: 06/01/2015 07:17    Assessment/Plan  Right orbital laceration  Afebrile. Site dry, clean and intact. Remove suture 06/14/2015. Cleanse right orbital site with saline, pat dry daily until and monitor for any signs of redness, swelling or drainage.    Family/ staff Communication:Reviewed plan with patient and facility Nurse supervisor.   Labs/tests ordered:  None

## 2015-06-21 ENCOUNTER — Encounter: Payer: Self-pay | Admitting: Family

## 2015-06-21 ENCOUNTER — Non-Acute Institutional Stay (SKILLED_NURSING_FACILITY): Payer: Commercial Managed Care - HMO | Admitting: Family

## 2015-06-21 DIAGNOSIS — S2241XD Multiple fractures of ribs, right side, subsequent encounter for fracture with routine healing: Secondary | ICD-10-CM | POA: Diagnosis not present

## 2015-06-21 DIAGNOSIS — I10 Essential (primary) hypertension: Secondary | ICD-10-CM | POA: Diagnosis not present

## 2015-06-21 DIAGNOSIS — F17200 Nicotine dependence, unspecified, uncomplicated: Secondary | ICD-10-CM

## 2015-06-21 DIAGNOSIS — F329 Major depressive disorder, single episode, unspecified: Secondary | ICD-10-CM | POA: Diagnosis not present

## 2015-06-21 DIAGNOSIS — J441 Chronic obstructive pulmonary disease with (acute) exacerbation: Secondary | ICD-10-CM | POA: Diagnosis not present

## 2015-06-21 DIAGNOSIS — F32A Depression, unspecified: Secondary | ICD-10-CM

## 2015-06-21 DIAGNOSIS — E785 Hyperlipidemia, unspecified: Secondary | ICD-10-CM | POA: Diagnosis not present

## 2015-06-21 DIAGNOSIS — S02401D Maxillary fracture, unspecified, subsequent encounter for fracture with routine healing: Secondary | ICD-10-CM

## 2015-06-21 NOTE — Progress Notes (Signed)
Location:  [facility] Room Number: [phone] Place of Service:  SNF [phone])  Provider: Richarda Blade, FNP-C Oneal Grout, MD   PCP: Oliver Barre, MD Patient Care Team: Corwin Levins, MD as PCP - General (Internal Medicine) Barbaraann Share, MD as Consulting Physician (Pulmonary Disease) Gean Birchwood, MD as Consulting Physician (Orthopedic Surgery) Barnett Abu, MD as Consulting Physician (Neurosurgery)  Extended Emergency Contact Information Primary Emergency Contact: Sandi Mariscal Address: [address]          [place], [place] Home Phone: [phone] Mobile Phone: [phone] Relation: Daughter  Code Status:Full Code  Goals of care:  Advanced Directive information Advanced Directives 06/21/2015  Does patient have an advance directive? Yes  Type of Advance Directive Out of facility DNR (pink MOST or yellow form)  Does patient want to make changes to advanced directive? No - Patient declined  Copy of advanced directive(s) in chart? Yes     Allergies  Allergen Reactions  . Ativan [Lorazepam] Other (See Comments)    Causes increased confusion/AMS    Chief Complaint  Patient presents with  . Discharge Note    Discharge from SNF    HPI:  82 y.o. female  Seen today at [facility] for discharge home. She was here for short term rehabilitation post hospital admission from 05/30/15-06/05/15 post fall with right multiple rib and right scapula tip fracture. While in hospital, she underwent alcohol withdrawal and had another fall with c1 fracture, right orbital laceration and right maxillary fracture and right eye injury. Her laceration was sutured. She was seen by neurosurgery and cervical collar has been recommended all the time. She was seen by ophthalmology and warm compresses and artifical tear were recommended. She was started on seroquel for her confusion. She was also treated for copd exacerbation this  admission. She is seen in her room today. She denies any acute issues this visit. She has worked well with PT/OT now stable for discharge home with PT/OT for ROM, Exercise, gait stability and muscle strengthening. She does not require any DME.    Past Medical History  Diagnosis Date  . Hypertension   . Depression   . Osteoporosis     (R) hip fx 11/2009 & (L) hip fx 2006  . COPD (chronic obstructive pulmonary disease) (HCC)   . Cervical spondylosis with myelopathy 01/2009    s/p decompression  . Aortic stenosis     moderate on echo 09/2013  . Alcohol abuse     Past Surgical History  Procedure Laterality Date  . Bladder tuck    . Cholecystectomy  1995  . Posterior laminectomy / decompression cervical spine  01/2009    Done by Dr. Danielle Dess  . Right hip  11/2009    ORIF/ Done by Dr. Turner Daniels  . Cataract extraction Right 06/06/13      reports that she has been smoking Cigarettes.  She has a 26.5 pack-year smoking history. She has never used smokeless tobacco. She reports that she drinks alcohol. She reports that she does not use illicit drugs. Social History   Social History  . Marital Status: Single    Spouse Name: N/A  . Number of Children: N/A  . Years of Education: N/A   Occupational History  . Not on file.   Social History Main Topics  . Smoking status: Current Every Day Smoker -- 0.50 packs/day for 53 years    Types: Cigarettes  . Smokeless tobacco: Never  Used  . Alcohol Use: 0.0 oz/week    0 Standard drinks or equivalent per week  . Drug Use: No  . Sexual Activity: Not on file   Other Topics Concern  . Not on file   Social History Narrative   Functional Status Survey:    Allergies  Allergen Reactions  . Ativan [Lorazepam] Other (See Comments)    Causes increased confusion/AMS    Pertinent  Health Maintenance Due  Topic Date Due  . PNA vac Low Risk Adult (2 of 2 - PCV13) 09/18/2014  . INFLUENZA VACCINE  09/25/2015  . DEXA SCAN  Completed     Medications:   Medication List       This list is accurate as of: 06/21/15  9:47 AM.  Always use your most recent med list.               albuterol 108 (90 Base) MCG/ACT inhaler  Commonly known as:  VENTOLIN HFA  Inhale 2 puffs into the lungs every 6 (six) hours as needed for wheezing or shortness of breath.     bacitracin-polymyxin b ophthalmic ointment  Commonly known as:  POLYSPORIN  Place 1 application into the right eye every 12 (twelve) hours. apply to eye every 12 hours while awake     budesonide-formoterol 160-4.5 MCG/ACT inhaler  Commonly known as:  SYMBICORT  Inhale 2 puffs into the lungs 2 (two) times daily.     guaiFENesin 600 MG 12 hr tablet  Commonly known as:  MUCINEX  Take 1 tablet (600 mg total) by mouth 2 (two) times daily.     HYDROcodone-acetaminophen 5-325 MG tablet  Commonly known as:  NORCO/VICODIN  Take 1 tablet by mouth every 6 (six) hours as needed for moderate pain.     hydrOXYzine 50 MG tablet  Commonly known as:  ATARAX/VISTARIL  1/2 tablet by mouth three times daily as needed for anxiety     ipratropium-albuterol 0.5-2.5 (3) MG/3ML Soln  Commonly known as:  DUONEB  Take 3 mLs by nebulization every 6 (six) hours as needed (if inhaler does not resolve wheezing).     losartan 50 MG tablet  Commonly known as:  COZAAR  Take 1 tablet (50 mg total) by mouth daily.     multivitamin with minerals Tabs tablet  Take 1 tablet by mouth daily.     nicotine 14 mg/24hr patch  Commonly known as:  NICODERM CQ - dosed in mg/24 hours  Place 14 mg onto the skin daily.     polyvinyl alcohol 1.4 % ophthalmic solution  Commonly known as:  LIQUIFILM TEARS  Place 1 drop into the right eye as needed for dry eyes.     rosuvastatin 10 MG tablet  Commonly known as:  CRESTOR  Take 1 tablet (10 mg total) by mouth daily.     tiotropium 18 MCG inhalation capsule  Commonly known as:  SPIRIVA  Place 18 mcg into inhaler and inhale daily.        Review of  Systems  Constitutional: Negative for fever, chills, activity change, appetite change and fatigue.  HENT: Negative for congestion, rhinorrhea, sinus pressure, sneezing and sore throat.   Eyes: Negative for pain, discharge and redness.  Respiratory: Negative for cough, chest tightness, shortness of breath and wheezing.   Cardiovascular: Negative for chest pain, palpitations and leg swelling.  Gastrointestinal: Negative.   Genitourinary: Negative.   Musculoskeletal: Positive for gait problem.       Wears Hard collar   Skin:  Negative for color change, pallor and rash.  Neurological: Negative for dizziness, seizures, syncope and headaches.  Psychiatric/Behavioral: Negative.     Filed Vitals:   06/21/15 0822  Height: 5\' 4"  (1.626 m)  Weight: 131 lb 12.8 oz (59.784 kg)   Body mass index is 22.61 kg/(m^2). Physical Exam  Constitutional: She appears well-developed and well-nourished.  Elderly in no acute issues  HENT:  Head: Normocephalic.  Mouth/Throat: Oropharynx is clear and moist.  Eyes: Conjunctivae and EOM are normal. Pupils are equal, round, and reactive to light. Right eye exhibits no discharge. Left eye exhibits no discharge. No scleral icterus.  Neck: No JVD present.  Hard collar in place.  Cardiovascular: Normal rate, regular rhythm, normal heart sounds and intact distal pulses.   Pulmonary/Chest: Effort normal and breath sounds normal. No respiratory distress. She has no wheezes. She has no rales.  Abdominal: Soft. Bowel sounds are normal. She exhibits no distension. There is no tenderness. There is no rebound and no guarding.  Musculoskeletal: Normal range of motion. She exhibits no edema or tenderness.  Muscle weakness to lower extremities.   Lymphadenopathy:    She has no cervical adenopathy.  Neurological: She is alert.  Skin: Skin is warm and dry. No rash noted. No erythema. No pallor.  Psychiatric: She has a normal mood and affect.    Labs reviewed: Basic  Metabolic Panel:  Recent Labs  16/11/9602/07/17 0413 06/02/15 0722 06/04/15 06/04/15 0422 06/11/15  NA 144 139 143 143 144  K 4.2 3.4*  --  3.8 4.3  CL 112* 107  --  111  --   CO2 24 23  --  26  --   GLUCOSE 139* 105*  --  89  --   BUN 13 20 20 20 19   CREATININE 0.46 0.47 0.5 0.54 0.7  CALCIUM 8.7* 8.3*  --  8.3*  --    Liver Function Tests:  Recent Labs  04/11/15 1731 05/31/15 0046  AST 22 23  ALT 20 23  ALKPHOS 74 79  BILITOT 0.4 0.2*  PROT 7.6 7.3  ALBUMIN 4.3 4.1   No results for input(s): LIPASE, AMYLASE in the last 8760 hours. No results for input(s): AMMONIA in the last 8760 hours. CBC:  Recent Labs  04/11/15 1731 05/31/15 0046  06/01/15 0413 06/02/15 0722 06/04/15 06/04/15 0422 06/11/15  WBC 6.6 10.3  < > 5.4 8.8 6.6 6.6 8.5  NEUTROABS 4.3 8.3*  --   --   --   --   --   --   HGB 13.9 12.5  < > 10.7* 10.7*  --  9.9* 11.2*  HCT 41.1 38.1  < > 32.1* 31.8*  --  29.5* 35*  MCV 91.6 93.8  < > 93.6 92.7  --  92.8  --   PLT 180.0 164  < > 133* 128*  --  141* 223  < > = values in this interval not displayed. Cardiac Enzymes: No results for input(s): CKTOTAL, CKMB, CKMBINDEX, TROPONINI in the last 8760 hours. BNP: Invalid input(s): POCBNP CBG:  Recent Labs  05/31/15 0758 06/01/15 0810  GLUCAP 133* 126*    Procedures and Imaging Studies During Stay: Dg Chest 2 View  06/02/2015  CLINICAL DATA:  Re-evaluate right rib fractures post new fall tonight in her hospital room. EXAM: CHEST  2 VIEW COMPARISON:  Radiograph yesterday. FINDINGS: The fourth through seventh right lateral rib fractures are unchanged in alignment. No evidence of new fracture since prior exam. No pneumothorax. Cardiomediastinal contours  are unchanged. Decreased vascular congestion from prior. Unchanged right lung base atelectasis. No definite pleural effusion. IMPRESSION: 1. Unchanged right rib fractures. 2. Unchanged right basilar atelectasis. 3. Decreased vascular congestion.  No new abnormality is  seen. Electronically Signed   By: Rubye Oaks M.D.   On: 06/02/2015 02:41   Dg Chest 2 View  05/30/2015  CLINICAL DATA:  Fall today with right-sided anterior pleuritic chest pain and rib pain. EXAM: CHEST - 2 VIEW COMPARISON:  04/11/2015 FINDINGS: Mildly displaced acute fracture is identified of the lateral right sixth rib. There may also be a fracture of the adjacent fifth rib. No pneumothorax identified. No edema or pleural fluid. The heart size and mediastinal contours are normal. IMPRESSION: At least one acute fracture is identified of the right sixth rib. There may be an adjacent fifth rib fracture. No pneumothorax identified. Electronically Signed   By: Irish Lack M.D.   On: 05/30/2015 22:51   Ct Head Wo Contrast  06/02/2015  CLINICAL DATA:  Larey Seat tonight hitting the right side of her head on the floor. EXAM: CT HEAD WITHOUT CONTRAST TECHNIQUE: Contiguous axial images were obtained from the base of the skull through the vertex without intravenous contrast. COMPARISON:  01/02/2009 FINDINGS: There is no intracranial hemorrhage or extra-axial fluid collection. There is moderate generalized atrophy. There is white matter hypodensity which is chronic and likely due to small vessel disease. No acute brain abnormality is evident. No interval change is evident except for mild worsening of generalized atrophy. There is a right frontal scalp hematoma. There is stranding opacity in the retro coloanal right orbit which is probably hemorrhage. There is mild proptosis on the right. Optic globe appears intact. There are fractures of the left C1 arch and probably also the left occipital condyle. These are incompletely imaged at the bottom of this examination. There is an air-fluid level in the right maxillary sinus. There is air in the soft tissues lateral to the maxillary sinus, and there probably are minimally displaced fractures of the anterior and lateral right maxillary sinus walls. IMPRESSION: 1. Fracture  of the left C1 arch and probably also the left occipital condyle, incompletely imaged. Recommend immobilization and C-spine CT. 2. New proptosis on the right. Stranding opacity behind the right optic globe may represent a retroconal hemorrhage. 3. Fractures of the anterior and lateral right maxillary sinus, mildly displaced. 4. No evidence of acute brain injury. 5. These results were called by telephone at the time of interpretation on 06/02/2015 at 12:27 am to Nurse Randa Evens, who verbally acknowledged these results. Electronically Signed   By: Ellery Plunk M.D.   On: 06/02/2015 00:31   Ct Chest Wo Contrast  05/31/2015  CLINICAL DATA:  Fall onto right-sided tonight with rib fracture on radiograph. EXAM: CT CHEST WITHOUT CONTRAST TECHNIQUE: Multidetector CT imaging of the chest was performed following the standard protocol without IV contrast. COMPARISON:  Chest radiograph yesterday.  Chest CT 10/18/2015 FINDINGS: Patient breathing motion artifact partially limits assessment. Minimally displaced fractures of anterior right fourth and lateral sixth ribs, with nondisplaced fracture of the anterior lateral right fifth and seventh ribs. No pneumothorax. Minimally displaced fracture of the right scapular tip. Cortical irregularity of the sternum with felt to be secondary to patient motion. Stable degenerative change in the thoracic spine without acute fracture. Stable Schmorl's node superior endplate of L1. Emphysema. Dependent right lung base opacity, likely atelectasis, less likely pulmonary contusion, no subjacent fracture of the lower ribs. Right upper lobe nodule image 34  series 2 measures 10 mm, unchanged. Nodule in the upper lobe measures 1.2 cm image 28 series 2, unchanged. Minimal right-sided pleural thickening without frank pleural effusion. Heart at the upper limits normal in size. Mitral annulus calcifications. Atherosclerotic calcifications of the thoracic aorta without periaortic soft tissue stranding or  fluid. Calcified hilar and mediastinal lymph nodes. No evidence of adenopathy. No acute abnormality in the included upper abdomen. IMPRESSION: 1. Right fourth through seventh anterior lateral rib fractures, fourth and sixth fractures are minimally displaced. No pneumothorax. 2. Minimally displaced fracture of the right scapular tip. 3. Dependent right lower lobe opacity is favored to be atelectasis over pulmonary contusion. 4. Right lung nodules are unchanged from exam 6 weeks prior. Continued CT follow-up is recommended to evaluate for stability. Electronically Signed   By: Rubye Oaks M.D.   On: 05/31/2015 02:29   Ct Cervical Spine Wo Contrast  06/02/2015  CLINICAL DATA:  Larey Seat tonight.  Generalized neck pain. EXAM: CT CERVICAL SPINE WITHOUT CONTRAST TECHNIQUE: Multidetector CT imaging of the cervical spine was performed without intravenous contrast. Multiplanar CT image reconstructions were also generated. COMPARISON:  None. FINDINGS: There are mildly displaced fractures of the left anterior and posterior C1 arch. Nondisplaced fracture through the left occipital condyle is also present. No involvement of the foramen transversarium. The articulations of C1 with the occipital condyles and with C2 remain intact. Odontoid is intact. Remainder of the cervical vertebrae are intact. There is prior interbody fusion with anterior plate-screw fixation from C4 through C7, solidly ossified and intact. Central canal is widely patent, with no compromise from the C1 fracture. No acute soft tissue abnormality is evident. Lung apices are markedly emphysematous. IMPRESSION: Acute mildly displaced fractures of the left half of C1 anteriorly and posteriorly, without compromise of the central canal. The fracture does not involve the foramen transversarium. Atlantoaxial articulation is intact. The articulations of C1 with the occipital condyles and with C2 are intact. There also is a nondisplaced fracture through the left  occipital condyle. These results will be called to the ordering clinician or representative by the Radiologist Assistant, and communication documented in the PACS or zVision Dashboard. Electronically Signed   By: Ellery Plunk M.D.   On: 06/02/2015 01:35   Mr Laqueta Jean MV Contrast  05/23/2015  CLINICAL DATA:  Dysarthria. EXAM: MRI HEAD WITHOUT AND WITH CONTRAST TECHNIQUE: Multiplanar, multiecho pulse sequences of the brain and surrounding structures were obtained without and with intravenous contrast. CONTRAST:  12mL MULTIHANCE GADOBENATE DIMEGLUMINE 529 MG/ML IV SOLN COMPARISON:  CT 01/02/2009 FINDINGS: Diffusion imaging does not show any acute or subacute infarction. The brain shows generalized atrophy. There chronic small-vessel ischemic changes of the pons. No focal cerebellar insult. The cerebral hemispheres show confluent chronic small vessel ischemic changes throughout the deep and subcortical white matter. Basal ganglia and thalami also show chronic small-vessel ischemic changes. No cortical or large vessel territory infarction. No mass lesion, hemorrhage, hydrocephalus or extra-axial collection. After contrast administration, no abnormal enhancement occurs. IMPRESSION: Atrophy and chronic small vessel ischemic changes throughout the brain. No acute or reversible finding. Electronically Signed   By: Paulina Fusi M.D.   On: 05/23/2015 18:32   Dg Chest Port 1 View  06/01/2015  CLINICAL DATA:  82 year old female status post fall with right fourth through seventh rib fractures. Initial encounter. EXAM: PORTABLE CHEST 1 VIEW COMPARISON:  Chest CT 05/31/2015 and earlier. FINDINGS: Portable AP semi upright view at 0448 hours. Increased pulmonary vascularity bilaterally since 05/30/2015. Multilevel right lateral  rib fractures again noted. Lower lung volumes. Stable cardiac size and mediastinal contours. No pneumothorax or pleural effusion. Mildly increased right hilar and infrahilar opacity. Calcified aortic  atherosclerosis. Cervical ACDF hardware re- demonstrated. IMPRESSION: 1. Lower lung volumes. No pneumothorax or pleural effusion identified. 2. Acute pulmonary vascular congestion/mild interstitial edema. 3. Increased right hilar and lung base opacity, favor atelectasis. Electronically Signed   By: Odessa Fleming M.D.   On: 06/01/2015 07:17    Assessment/Plan:   HTN B/p Stable. Continue Losartan. BMP in 1-2 weeks with PCP  COPD Stable.Afebrile.  No cough, Shortness of breath or wheezing. Continue Symbicort 160-4.5 mcg/inhaler, Duoneb and Spiriva. Continue oxygen via nasal Cannula.  Right side rib fracture Status post short term rehabilitation post hospital admission from 05/30/15-06/05/15 post fall with right multiple rib and right scapula tip fracture.  Her laceration was sutured. She was seen by neurosurgery and cervical collar has been recommended all the time. Continue to monitor.  Fracture of Maxilla  S/p hospital admission , she underwent alcohol withdrawal and had another fall with c1 fracture, right orbital laceration and right maxillary fracture and right eye injury.Progressive healing. Sutures removed during stay at the facility. Incision healed.   Hyperlipidemia  Continue Rosuvastatin 10 mg Tablet. Lipid panel with PCP.  Smoking  Continue with smoking cesastion. Continue with Nicotine patch.  Depression  Stable. No mood changes. Continue to monitor.   Patient is being discharged with the following home health services:    PT/OT for ROM, Exercise, gait stability and muscle strengthening.  Patient is being discharged with the following durable medical equipment:    She does not require any DME.   Hard script for all medications written X 1 month supply.    Patient has been advised to f/u with their PCP in 1-2 weeks to bring them up to date on their rehab stay.  Social services at facility was responsible for arranging this appointment.  Pt was provided with a 30 day supply of  prescriptions for medications and refills must be obtained from their PCP.  For controlled substances, a more limited supply may be provided adequate until PCP appointment only.  Future labs/tests needed: CBC, BMP with PCP in 1-2 weeks

## 2015-06-25 DIAGNOSIS — M6281 Muscle weakness (generalized): Secondary | ICD-10-CM | POA: Diagnosis not present

## 2015-06-25 DIAGNOSIS — R2689 Other abnormalities of gait and mobility: Secondary | ICD-10-CM | POA: Diagnosis not present

## 2015-06-25 DIAGNOSIS — Z9181 History of falling: Secondary | ICD-10-CM | POA: Diagnosis not present

## 2015-06-25 DIAGNOSIS — J441 Chronic obstructive pulmonary disease with (acute) exacerbation: Secondary | ICD-10-CM | POA: Diagnosis not present

## 2015-06-26 ENCOUNTER — Telehealth: Payer: Self-pay | Admitting: *Deleted

## 2015-06-26 NOTE — Telephone Encounter (Signed)
Called Karina Andrews to confirm her script strength on Crestor she stated that it should have been 10 mg instead of 50 mg. I informed her that I would call the pharmacy regarding this, she stated that would be ok with her. I called and spoke with Romesh  (pharmacist).

## 2015-06-27 ENCOUNTER — Encounter: Payer: Self-pay | Admitting: Internal Medicine

## 2015-06-27 ENCOUNTER — Ambulatory Visit (INDEPENDENT_AMBULATORY_CARE_PROVIDER_SITE_OTHER): Payer: Commercial Managed Care - HMO | Admitting: Internal Medicine

## 2015-06-27 VITALS — BP 120/60 | HR 100 | Temp 98.3°F | Resp 20 | Wt 135.0 lb

## 2015-06-27 DIAGNOSIS — J449 Chronic obstructive pulmonary disease, unspecified: Secondary | ICD-10-CM | POA: Diagnosis not present

## 2015-06-27 DIAGNOSIS — I10 Essential (primary) hypertension: Secondary | ICD-10-CM

## 2015-06-27 DIAGNOSIS — R2689 Other abnormalities of gait and mobility: Secondary | ICD-10-CM | POA: Diagnosis not present

## 2015-06-27 DIAGNOSIS — S0242XD Fracture of alveolus of maxilla, subsequent encounter for fracture with routine healing: Secondary | ICD-10-CM | POA: Diagnosis not present

## 2015-06-27 DIAGNOSIS — W19XXXD Unspecified fall, subsequent encounter: Secondary | ICD-10-CM | POA: Diagnosis not present

## 2015-06-27 DIAGNOSIS — S12001A Unspecified nondisplaced fracture of first cervical vertebra, initial encounter for closed fracture: Secondary | ICD-10-CM

## 2015-06-27 DIAGNOSIS — S42191D Fracture of other part of scapula, right shoulder, subsequent encounter for fracture with routine healing: Secondary | ICD-10-CM | POA: Diagnosis not present

## 2015-06-27 DIAGNOSIS — S12001D Unspecified nondisplaced fracture of first cervical vertebra, subsequent encounter for fracture with routine healing: Secondary | ICD-10-CM | POA: Diagnosis not present

## 2015-06-27 DIAGNOSIS — S2241XD Multiple fractures of ribs, right side, subsequent encounter for fracture with routine healing: Secondary | ICD-10-CM | POA: Diagnosis not present

## 2015-06-27 NOTE — Progress Notes (Signed)
Subjective:    Patient ID: Karina Andrews, female    DOB: June 14, 1930, 82 y.o.   MRN: [id number]  HPI  Here to f/u post rehab after 2 falls, one prior and then another in hosp with mult rib fx, then c1 fx with the second fall.  Daugter feels the second fall may have been due to overmedication. And is very motivated in pressuring me in finding any medical reason to contribute to the fall. Daughter is adamant ETOH had nothing to do with the original or second fall, states mult times, "No one would listen to me."   I do note lorazepam 0-4 mg ordered every 6 hrs as needed, but not clear to me from records what dose if any given or the timing related to the fall.  Pt denies chest pain, increased sob or doe, wheezing, orthopnea, PND, increased LE swelling, palpitations, dizziness or syncope.  Pt denies new neurological symptoms such as new headache, or facial or extremity weakness or numbness  Does not have any sched fu with NS - daughter requests Dr Elsner/NS. Pt still in collar.   Still has home o2 2L , mostly now in wheelchair, plans to try to begin walking soon with PT.   Past Medical History  Diagnosis Date  . Hypertension   . Depression   . Osteoporosis     (R) hip fx 11/2009 & (L) hip fx 2006  . COPD (chronic obstructive pulmonary disease) (HCC)   . Cervical spondylosis with myelopathy 01/2009    s/p decompression  . Aortic stenosis     moderate on echo 09/2013  . Alcohol abuse    Past Surgical History  Procedure Laterality Date  . Bladder tuck    . Cholecystectomy  1995  . Posterior laminectomy / decompression cervical spine  01/2009    Done by Dr. Danielle Dess  . Right hip  11/2009    ORIF/ Done by Dr. Turner Daniels  . Cataract extraction Right 06/06/13    reports that she has been smoking Cigarettes.  She has a 26.5 pack-year smoking history. She has never used smokeless tobacco. She reports that she drinks alcohol. She reports that she does not use illicit drugs. family history includes Heart  attack in her father. Allergies  Allergen Reactions  . Ativan [Lorazepam] Other (See Comments)    Causes increased confusion/AMS   Current Outpatient Prescriptions on File Prior to Visit  Medication Sig Dispense Refill  . albuterol (VENTOLIN HFA) 108 (90 Base) MCG/ACT inhaler Inhale 2 puffs into the lungs every 6 (six) hours as needed for wheezing or shortness of breath. 18 g 0  . bacitracin-polymyxin b (POLYSPORIN) ophthalmic ointment Place 1 application into the right eye every 12 (twelve) hours. apply to eye every 12 hours while awake    . budesonide-formoterol (SYMBICORT) 160-4.5 MCG/ACT inhaler Inhale 2 puffs into the lungs 2 (two) times daily. 10.2 g 11  . guaiFENesin (MUCINEX) 600 MG 12 hr tablet Take 1 tablet (600 mg total) by mouth 2 (two) times daily.    Marland Kitchen HYDROcodone-acetaminophen (NORCO/VICODIN) 5-325 MG tablet Take 1 tablet by mouth every 6 (six) hours as needed for moderate pain. 30 tablet 0  . hydrOXYzine (ATARAX/VISTARIL) 50 MG tablet 1/2 tablet by mouth three times daily as needed for anxiety    . ipratropium-albuterol (DUONEB) 0.5-2.5 (3) MG/3ML SOLN Take 3 mLs by nebulization every 6 (six) hours as needed (if inhaler does not resolve wheezing). 360 mL 0  . losartan (COZAAR) 50 MG tablet Take  1 tablet (50 mg total) by mouth daily. 90 tablet 3  . Multiple Vitamin (MULTIVITAMIN WITH MINERALS) TABS tablet Take 1 tablet by mouth daily.    . nicotine (NICODERM CQ - DOSED IN MG/24 HOURS) 14 mg/24hr patch Place 14 mg onto the skin daily.    . polyvinyl alcohol (LIQUIFILM TEARS) 1.4 % ophthalmic solution Place 1 drop into the right eye as needed for dry eyes. 15 mL 0  . rosuvastatin (CRESTOR) 10 MG tablet Take 1 tablet (10 mg total) by mouth daily. 90 tablet 3  . tiotropium (SPIRIVA) 18 MCG inhalation capsule Place 18 mcg into inhaler and inhale daily.     No current facility-administered medications on file prior to visit.   Review of Systems  Constitutional: Negative for unusual  diaphoresis or night sweats HENT: Negative for ear swelling or discharge Eyes: Negative for worsening visual haziness  Respiratory: Negative for choking and stridor.   Gastrointestinal: Negative for distension or worsening eructation Genitourinary: Negative for retention or change in urine volume.  Musculoskeletal: Negative for other MSK pain or swelling Skin: Negative for color change and worsening wound Neurological: Negative for tremors and numbness other than noted  Psychiatric/Behavioral: Negative for decreased concentration or agitation other than above       Objective:   Physical Exam BP 120/60 mmHg  Pulse 100  Temp(Src) 98.3 F (36.8 C) (Oral)  Resp 20  Wt 135 lb (61.236 kg)  SpO2 92% VS noted, wearing hard c-collar Constitutional: Pt appears in no apparent distress HENT: Head: NCAT.  Right Ear: External ear normal.  Left Ear: External ear normal.  Eyes: . Pupils are equal, round, and reactive to light. Conjunctivae and EOM are normal Neck: Normal range of motion. Neck supple.  Cardiovascular: Normal rate and regular rhythm.   Pulmonary/Chest: Effort normal and breath sounds decreased without rales or wheezing.  Abd:  Soft, NT, ND, + BS Neurological: Pt is alert. At baseline confused , motor intact Skin: Skin is warm. No rash, no LE edema Psychiatric: Pt behavior is normal. No agitation.     Assessment & Plan:

## 2015-06-27 NOTE — Patient Instructions (Signed)
Please continue all other medications as before, and refills have been done if requested.  Please have the pharmacy call with any other refills you may need.  Please keep your appointments with your specialists as you may have planned  You will be contacted regarding the referral for: Dr Elsner/ NS

## 2015-06-27 NOTE — Progress Notes (Signed)
Pre visit review using our clinic review tool, if applicable. No additional management support is needed unless otherwise documented below in the visit note. 

## 2015-06-29 DIAGNOSIS — S12001D Unspecified nondisplaced fracture of first cervical vertebra, subsequent encounter for fracture with routine healing: Secondary | ICD-10-CM | POA: Diagnosis not present

## 2015-06-29 DIAGNOSIS — S2241XD Multiple fractures of ribs, right side, subsequent encounter for fracture with routine healing: Secondary | ICD-10-CM | POA: Diagnosis not present

## 2015-06-29 DIAGNOSIS — S0242XD Fracture of alveolus of maxilla, subsequent encounter for fracture with routine healing: Secondary | ICD-10-CM | POA: Diagnosis not present

## 2015-06-29 DIAGNOSIS — R2689 Other abnormalities of gait and mobility: Secondary | ICD-10-CM | POA: Diagnosis not present

## 2015-06-29 DIAGNOSIS — W19XXXD Unspecified fall, subsequent encounter: Secondary | ICD-10-CM | POA: Diagnosis not present

## 2015-06-29 DIAGNOSIS — S42191D Fracture of other part of scapula, right shoulder, subsequent encounter for fracture with routine healing: Secondary | ICD-10-CM | POA: Diagnosis not present

## 2015-07-01 DIAGNOSIS — J449 Chronic obstructive pulmonary disease, unspecified: Secondary | ICD-10-CM | POA: Insufficient documentation

## 2015-07-01 NOTE — Assessment & Plan Note (Signed)
stable overall by history and exam, recent data reviewed with pt, and pt to continue medical treatment as before,  to f/u any worsening symptoms or concerns BP Readings from Last 3 Encounters:  06/27/15 120/60  06/21/15 118/68  06/14/15 124/72

## 2015-07-01 NOTE — Assessment & Plan Note (Addendum)
Overall stable overall by history and exam, and pt to continue medical treatment as before,  For refer NS in f/u, Also to f/u any worsening symptoms or concerns,  Note:  Total time for pt hx, exam, review of record with pt in the room, determination of diagnoses and plan for further eval and tx is > 40 min, with over 50% spent in coordination and counseling of patient

## 2015-07-01 NOTE — Assessment & Plan Note (Signed)
stable overall by history and exam, recent data reviewed with pt, and pt to continue medical treatment as before,  to f/u any worsening symptoms or concerns SpO2 Readings from Last 3 Encounters:  06/27/15 92%  06/21/15 96%  06/14/15 96%   To cont home o2

## 2015-07-03 DIAGNOSIS — S0242XD Fracture of alveolus of maxilla, subsequent encounter for fracture with routine healing: Secondary | ICD-10-CM | POA: Diagnosis not present

## 2015-07-03 DIAGNOSIS — S12001D Unspecified nondisplaced fracture of first cervical vertebra, subsequent encounter for fracture with routine healing: Secondary | ICD-10-CM | POA: Diagnosis not present

## 2015-07-03 DIAGNOSIS — S42191D Fracture of other part of scapula, right shoulder, subsequent encounter for fracture with routine healing: Secondary | ICD-10-CM | POA: Diagnosis not present

## 2015-07-03 DIAGNOSIS — W19XXXD Unspecified fall, subsequent encounter: Secondary | ICD-10-CM | POA: Diagnosis not present

## 2015-07-03 DIAGNOSIS — S2241XD Multiple fractures of ribs, right side, subsequent encounter for fracture with routine healing: Secondary | ICD-10-CM | POA: Diagnosis not present

## 2015-07-03 DIAGNOSIS — R2689 Other abnormalities of gait and mobility: Secondary | ICD-10-CM | POA: Diagnosis not present

## 2015-07-04 ENCOUNTER — Telehealth: Payer: Self-pay

## 2015-07-04 DIAGNOSIS — S0242XD Fracture of alveolus of maxilla, subsequent encounter for fracture with routine healing: Secondary | ICD-10-CM | POA: Diagnosis not present

## 2015-07-04 DIAGNOSIS — S42191D Fracture of other part of scapula, right shoulder, subsequent encounter for fracture with routine healing: Secondary | ICD-10-CM | POA: Diagnosis not present

## 2015-07-04 DIAGNOSIS — S12001D Unspecified nondisplaced fracture of first cervical vertebra, subsequent encounter for fracture with routine healing: Secondary | ICD-10-CM | POA: Diagnosis not present

## 2015-07-04 DIAGNOSIS — W19XXXD Unspecified fall, subsequent encounter: Secondary | ICD-10-CM | POA: Diagnosis not present

## 2015-07-04 DIAGNOSIS — S2241XD Multiple fractures of ribs, right side, subsequent encounter for fracture with routine healing: Secondary | ICD-10-CM | POA: Diagnosis not present

## 2015-07-04 DIAGNOSIS — R2689 Other abnormalities of gait and mobility: Secondary | ICD-10-CM | POA: Diagnosis not present

## 2015-07-04 NOTE — Telephone Encounter (Signed)
Ron ParkerJim Bell with [facility]  Verbal order for OT:  2xwk for 7 weeks 1xwk for 1 week  Access to LOV for BPM. Gave BPM to WaverlyJim. Recent BPM is 104.   Verbal okay given to WaverlyJim.   [facility] to assist pt with bathing. Verbal okay given. Informed that pt insurance may not cover. [facility] will verify benefits first.

## 2015-07-05 DIAGNOSIS — S12001D Unspecified nondisplaced fracture of first cervical vertebra, subsequent encounter for fracture with routine healing: Secondary | ICD-10-CM | POA: Diagnosis not present

## 2015-07-05 DIAGNOSIS — S0242XD Fracture of alveolus of maxilla, subsequent encounter for fracture with routine healing: Secondary | ICD-10-CM | POA: Diagnosis not present

## 2015-07-05 DIAGNOSIS — R2689 Other abnormalities of gait and mobility: Secondary | ICD-10-CM | POA: Diagnosis not present

## 2015-07-05 DIAGNOSIS — S2241XD Multiple fractures of ribs, right side, subsequent encounter for fracture with routine healing: Secondary | ICD-10-CM | POA: Diagnosis not present

## 2015-07-05 DIAGNOSIS — W19XXXD Unspecified fall, subsequent encounter: Secondary | ICD-10-CM | POA: Diagnosis not present

## 2015-07-05 DIAGNOSIS — S42191D Fracture of other part of scapula, right shoulder, subsequent encounter for fracture with routine healing: Secondary | ICD-10-CM | POA: Diagnosis not present

## 2015-07-06 ENCOUNTER — Ambulatory Visit: Payer: Commercial Managed Care - HMO | Admitting: Internal Medicine

## 2015-07-06 DIAGNOSIS — S0242XD Fracture of alveolus of maxilla, subsequent encounter for fracture with routine healing: Secondary | ICD-10-CM | POA: Diagnosis not present

## 2015-07-06 DIAGNOSIS — W19XXXD Unspecified fall, subsequent encounter: Secondary | ICD-10-CM | POA: Diagnosis not present

## 2015-07-06 DIAGNOSIS — S42191D Fracture of other part of scapula, right shoulder, subsequent encounter for fracture with routine healing: Secondary | ICD-10-CM | POA: Diagnosis not present

## 2015-07-06 DIAGNOSIS — S2241XD Multiple fractures of ribs, right side, subsequent encounter for fracture with routine healing: Secondary | ICD-10-CM | POA: Diagnosis not present

## 2015-07-06 DIAGNOSIS — R2689 Other abnormalities of gait and mobility: Secondary | ICD-10-CM | POA: Diagnosis not present

## 2015-07-06 DIAGNOSIS — S12001D Unspecified nondisplaced fracture of first cervical vertebra, subsequent encounter for fracture with routine healing: Secondary | ICD-10-CM | POA: Diagnosis not present

## 2015-07-10 DIAGNOSIS — S12001A Unspecified nondisplaced fracture of first cervical vertebra, initial encounter for closed fracture: Secondary | ICD-10-CM | POA: Diagnosis not present

## 2015-07-11 DIAGNOSIS — S12001D Unspecified nondisplaced fracture of first cervical vertebra, subsequent encounter for fracture with routine healing: Secondary | ICD-10-CM | POA: Diagnosis not present

## 2015-07-11 DIAGNOSIS — S0242XD Fracture of alveolus of maxilla, subsequent encounter for fracture with routine healing: Secondary | ICD-10-CM | POA: Diagnosis not present

## 2015-07-11 DIAGNOSIS — S2241XD Multiple fractures of ribs, right side, subsequent encounter for fracture with routine healing: Secondary | ICD-10-CM | POA: Diagnosis not present

## 2015-07-11 DIAGNOSIS — R2689 Other abnormalities of gait and mobility: Secondary | ICD-10-CM | POA: Diagnosis not present

## 2015-07-11 DIAGNOSIS — W19XXXD Unspecified fall, subsequent encounter: Secondary | ICD-10-CM | POA: Diagnosis not present

## 2015-07-11 DIAGNOSIS — S42191D Fracture of other part of scapula, right shoulder, subsequent encounter for fracture with routine healing: Secondary | ICD-10-CM | POA: Diagnosis not present

## 2015-07-12 DIAGNOSIS — R2689 Other abnormalities of gait and mobility: Secondary | ICD-10-CM | POA: Diagnosis not present

## 2015-07-12 DIAGNOSIS — S42191D Fracture of other part of scapula, right shoulder, subsequent encounter for fracture with routine healing: Secondary | ICD-10-CM | POA: Diagnosis not present

## 2015-07-12 DIAGNOSIS — W19XXXD Unspecified fall, subsequent encounter: Secondary | ICD-10-CM | POA: Diagnosis not present

## 2015-07-12 DIAGNOSIS — S0242XD Fracture of alveolus of maxilla, subsequent encounter for fracture with routine healing: Secondary | ICD-10-CM | POA: Diagnosis not present

## 2015-07-12 DIAGNOSIS — S12001D Unspecified nondisplaced fracture of first cervical vertebra, subsequent encounter for fracture with routine healing: Secondary | ICD-10-CM | POA: Diagnosis not present

## 2015-07-12 DIAGNOSIS — S2241XD Multiple fractures of ribs, right side, subsequent encounter for fracture with routine healing: Secondary | ICD-10-CM | POA: Diagnosis not present

## 2015-07-16 DIAGNOSIS — S2241XD Multiple fractures of ribs, right side, subsequent encounter for fracture with routine healing: Secondary | ICD-10-CM | POA: Diagnosis not present

## 2015-07-16 DIAGNOSIS — W19XXXD Unspecified fall, subsequent encounter: Secondary | ICD-10-CM | POA: Diagnosis not present

## 2015-07-16 DIAGNOSIS — S0242XD Fracture of alveolus of maxilla, subsequent encounter for fracture with routine healing: Secondary | ICD-10-CM | POA: Diagnosis not present

## 2015-07-16 DIAGNOSIS — R2689 Other abnormalities of gait and mobility: Secondary | ICD-10-CM | POA: Diagnosis not present

## 2015-07-16 DIAGNOSIS — S12001D Unspecified nondisplaced fracture of first cervical vertebra, subsequent encounter for fracture with routine healing: Secondary | ICD-10-CM | POA: Diagnosis not present

## 2015-07-16 DIAGNOSIS — S42191D Fracture of other part of scapula, right shoulder, subsequent encounter for fracture with routine healing: Secondary | ICD-10-CM | POA: Diagnosis not present

## 2015-07-17 ENCOUNTER — Telehealth: Payer: Self-pay | Admitting: *Deleted

## 2015-07-17 MED ORDER — HYDROCODONE-ACETAMINOPHEN 5-325 MG PO TABS: 1.0000 | ORAL_TABLET | Freq: Four times a day (QID) | ORAL | Status: DC | PRN

## 2015-07-17 NOTE — Telephone Encounter (Signed)
Placed up front for pick up patient daughter aware

## 2015-07-17 NOTE — Telephone Encounter (Signed)
Done hardcopy to Corinne  

## 2015-07-17 NOTE — Telephone Encounter (Signed)
Left msg on triage yesterday afternoon requesting refill on mom pain med (Hydrocodone).Raechel Chute/lmb

## 2015-07-18 DIAGNOSIS — W19XXXD Unspecified fall, subsequent encounter: Secondary | ICD-10-CM | POA: Diagnosis not present

## 2015-07-18 DIAGNOSIS — S12001D Unspecified nondisplaced fracture of first cervical vertebra, subsequent encounter for fracture with routine healing: Secondary | ICD-10-CM | POA: Diagnosis not present

## 2015-07-18 DIAGNOSIS — S2241XD Multiple fractures of ribs, right side, subsequent encounter for fracture with routine healing: Secondary | ICD-10-CM | POA: Diagnosis not present

## 2015-07-18 DIAGNOSIS — R2689 Other abnormalities of gait and mobility: Secondary | ICD-10-CM | POA: Diagnosis not present

## 2015-07-18 DIAGNOSIS — S42191D Fracture of other part of scapula, right shoulder, subsequent encounter for fracture with routine healing: Secondary | ICD-10-CM | POA: Diagnosis not present

## 2015-07-18 DIAGNOSIS — S0242XD Fracture of alveolus of maxilla, subsequent encounter for fracture with routine healing: Secondary | ICD-10-CM | POA: Diagnosis not present

## 2015-07-19 DIAGNOSIS — S2241XD Multiple fractures of ribs, right side, subsequent encounter for fracture with routine healing: Secondary | ICD-10-CM | POA: Diagnosis not present

## 2015-07-19 DIAGNOSIS — S0242XD Fracture of alveolus of maxilla, subsequent encounter for fracture with routine healing: Secondary | ICD-10-CM | POA: Diagnosis not present

## 2015-07-19 DIAGNOSIS — S42191D Fracture of other part of scapula, right shoulder, subsequent encounter for fracture with routine healing: Secondary | ICD-10-CM | POA: Diagnosis not present

## 2015-07-19 DIAGNOSIS — R2689 Other abnormalities of gait and mobility: Secondary | ICD-10-CM | POA: Diagnosis not present

## 2015-07-19 DIAGNOSIS — W19XXXD Unspecified fall, subsequent encounter: Secondary | ICD-10-CM | POA: Diagnosis not present

## 2015-07-19 DIAGNOSIS — S12001D Unspecified nondisplaced fracture of first cervical vertebra, subsequent encounter for fracture with routine healing: Secondary | ICD-10-CM | POA: Diagnosis not present

## 2015-07-20 DIAGNOSIS — W19XXXD Unspecified fall, subsequent encounter: Secondary | ICD-10-CM | POA: Diagnosis not present

## 2015-07-20 DIAGNOSIS — S2241XD Multiple fractures of ribs, right side, subsequent encounter for fracture with routine healing: Secondary | ICD-10-CM | POA: Diagnosis not present

## 2015-07-20 DIAGNOSIS — S42191D Fracture of other part of scapula, right shoulder, subsequent encounter for fracture with routine healing: Secondary | ICD-10-CM | POA: Diagnosis not present

## 2015-07-20 DIAGNOSIS — R2689 Other abnormalities of gait and mobility: Secondary | ICD-10-CM | POA: Diagnosis not present

## 2015-07-20 DIAGNOSIS — S0242XD Fracture of alveolus of maxilla, subsequent encounter for fracture with routine healing: Secondary | ICD-10-CM | POA: Diagnosis not present

## 2015-07-20 DIAGNOSIS — S12001D Unspecified nondisplaced fracture of first cervical vertebra, subsequent encounter for fracture with routine healing: Secondary | ICD-10-CM | POA: Diagnosis not present

## 2015-07-24 DIAGNOSIS — R2689 Other abnormalities of gait and mobility: Secondary | ICD-10-CM | POA: Diagnosis not present

## 2015-07-24 DIAGNOSIS — S42191D Fracture of other part of scapula, right shoulder, subsequent encounter for fracture with routine healing: Secondary | ICD-10-CM | POA: Diagnosis not present

## 2015-07-24 DIAGNOSIS — S0242XD Fracture of alveolus of maxilla, subsequent encounter for fracture with routine healing: Secondary | ICD-10-CM | POA: Diagnosis not present

## 2015-07-24 DIAGNOSIS — S12001D Unspecified nondisplaced fracture of first cervical vertebra, subsequent encounter for fracture with routine healing: Secondary | ICD-10-CM | POA: Diagnosis not present

## 2015-07-24 DIAGNOSIS — S2241XD Multiple fractures of ribs, right side, subsequent encounter for fracture with routine healing: Secondary | ICD-10-CM | POA: Diagnosis not present

## 2015-07-24 DIAGNOSIS — W19XXXD Unspecified fall, subsequent encounter: Secondary | ICD-10-CM | POA: Diagnosis not present

## 2015-07-25 DIAGNOSIS — S0242XD Fracture of alveolus of maxilla, subsequent encounter for fracture with routine healing: Secondary | ICD-10-CM | POA: Diagnosis not present

## 2015-07-25 DIAGNOSIS — S2241XD Multiple fractures of ribs, right side, subsequent encounter for fracture with routine healing: Secondary | ICD-10-CM | POA: Diagnosis not present

## 2015-07-25 DIAGNOSIS — S12001D Unspecified nondisplaced fracture of first cervical vertebra, subsequent encounter for fracture with routine healing: Secondary | ICD-10-CM | POA: Diagnosis not present

## 2015-07-25 DIAGNOSIS — R2689 Other abnormalities of gait and mobility: Secondary | ICD-10-CM | POA: Diagnosis not present

## 2015-07-25 DIAGNOSIS — W19XXXD Unspecified fall, subsequent encounter: Secondary | ICD-10-CM | POA: Diagnosis not present

## 2015-07-25 DIAGNOSIS — S42191D Fracture of other part of scapula, right shoulder, subsequent encounter for fracture with routine healing: Secondary | ICD-10-CM | POA: Diagnosis not present

## 2015-07-26 DIAGNOSIS — M6281 Muscle weakness (generalized): Secondary | ICD-10-CM | POA: Diagnosis not present

## 2015-07-26 DIAGNOSIS — Z9181 History of falling: Secondary | ICD-10-CM | POA: Diagnosis not present

## 2015-07-26 DIAGNOSIS — J441 Chronic obstructive pulmonary disease with (acute) exacerbation: Secondary | ICD-10-CM | POA: Diagnosis not present

## 2015-07-26 DIAGNOSIS — R2689 Other abnormalities of gait and mobility: Secondary | ICD-10-CM | POA: Diagnosis not present

## 2015-07-30 DIAGNOSIS — W19XXXD Unspecified fall, subsequent encounter: Secondary | ICD-10-CM | POA: Diagnosis not present

## 2015-07-30 DIAGNOSIS — S0242XD Fracture of alveolus of maxilla, subsequent encounter for fracture with routine healing: Secondary | ICD-10-CM | POA: Diagnosis not present

## 2015-07-30 DIAGNOSIS — S2241XD Multiple fractures of ribs, right side, subsequent encounter for fracture with routine healing: Secondary | ICD-10-CM | POA: Diagnosis not present

## 2015-07-30 DIAGNOSIS — S42191D Fracture of other part of scapula, right shoulder, subsequent encounter for fracture with routine healing: Secondary | ICD-10-CM | POA: Diagnosis not present

## 2015-07-30 DIAGNOSIS — R2689 Other abnormalities of gait and mobility: Secondary | ICD-10-CM | POA: Diagnosis not present

## 2015-07-30 DIAGNOSIS — S12001D Unspecified nondisplaced fracture of first cervical vertebra, subsequent encounter for fracture with routine healing: Secondary | ICD-10-CM | POA: Diagnosis not present

## 2015-07-31 DIAGNOSIS — S2241XD Multiple fractures of ribs, right side, subsequent encounter for fracture with routine healing: Secondary | ICD-10-CM | POA: Diagnosis not present

## 2015-07-31 DIAGNOSIS — S12001D Unspecified nondisplaced fracture of first cervical vertebra, subsequent encounter for fracture with routine healing: Secondary | ICD-10-CM | POA: Diagnosis not present

## 2015-07-31 DIAGNOSIS — R2689 Other abnormalities of gait and mobility: Secondary | ICD-10-CM | POA: Diagnosis not present

## 2015-07-31 DIAGNOSIS — S42191D Fracture of other part of scapula, right shoulder, subsequent encounter for fracture with routine healing: Secondary | ICD-10-CM | POA: Diagnosis not present

## 2015-07-31 DIAGNOSIS — W19XXXD Unspecified fall, subsequent encounter: Secondary | ICD-10-CM | POA: Diagnosis not present

## 2015-07-31 DIAGNOSIS — S0242XD Fracture of alveolus of maxilla, subsequent encounter for fracture with routine healing: Secondary | ICD-10-CM | POA: Diagnosis not present

## 2015-08-01 DIAGNOSIS — S2241XD Multiple fractures of ribs, right side, subsequent encounter for fracture with routine healing: Secondary | ICD-10-CM | POA: Diagnosis not present

## 2015-08-01 DIAGNOSIS — S0242XD Fracture of alveolus of maxilla, subsequent encounter for fracture with routine healing: Secondary | ICD-10-CM | POA: Diagnosis not present

## 2015-08-01 DIAGNOSIS — S12001D Unspecified nondisplaced fracture of first cervical vertebra, subsequent encounter for fracture with routine healing: Secondary | ICD-10-CM | POA: Diagnosis not present

## 2015-08-01 DIAGNOSIS — R2689 Other abnormalities of gait and mobility: Secondary | ICD-10-CM | POA: Diagnosis not present

## 2015-08-01 DIAGNOSIS — S42191D Fracture of other part of scapula, right shoulder, subsequent encounter for fracture with routine healing: Secondary | ICD-10-CM | POA: Diagnosis not present

## 2015-08-01 DIAGNOSIS — W19XXXD Unspecified fall, subsequent encounter: Secondary | ICD-10-CM | POA: Diagnosis not present

## 2015-08-03 ENCOUNTER — Ambulatory Visit (INDEPENDENT_AMBULATORY_CARE_PROVIDER_SITE_OTHER): Payer: Commercial Managed Care - HMO | Admitting: Internal Medicine

## 2015-08-03 ENCOUNTER — Ambulatory Visit (INDEPENDENT_AMBULATORY_CARE_PROVIDER_SITE_OTHER)
Admission: RE | Admit: 2015-08-03 | Discharge: 2015-08-03 | Disposition: A | Discharge status: Home / Self Care | Payer: Commercial Managed Care - HMO | Source: Ambulatory Visit | Attending: Internal Medicine | Admitting: Internal Medicine

## 2015-08-03 ENCOUNTER — Encounter: Payer: Self-pay | Admitting: Internal Medicine

## 2015-08-03 VITALS — BP 104/60 | HR 96 | Ht 64.0 in | Wt 124.0 lb

## 2015-08-03 DIAGNOSIS — R918 Other nonspecific abnormal finding of lung field: Secondary | ICD-10-CM

## 2015-08-03 DIAGNOSIS — F17210 Nicotine dependence, cigarettes, uncomplicated: Secondary | ICD-10-CM

## 2015-08-03 DIAGNOSIS — I350 Nonrheumatic aortic (valve) stenosis: Secondary | ICD-10-CM | POA: Diagnosis not present

## 2015-08-03 DIAGNOSIS — J449 Chronic obstructive pulmonary disease, unspecified: Secondary | ICD-10-CM

## 2015-08-03 DIAGNOSIS — Z720 Tobacco use: Secondary | ICD-10-CM | POA: Diagnosis not present

## 2015-08-03 DIAGNOSIS — J984 Other disorders of lung: Secondary | ICD-10-CM

## 2015-08-03 NOTE — Progress Notes (Signed)
Subjective:   Patient ID: Karina Andrews, female    DOB: Feb 03, 1931     MRN: [id number]    Brief patient profile:  33 yowf active smoker with GOLD I copd 07/2008 maint on symbicort 160 2 bid and prn albuterol with increase doe x Jan 2017 > CT c/w mpn's so referred to pulmonary clinic 05/04/2015 by Dr Oliver Barre     History of Present Illness  05/04/2015 1st [facility] office visit/ Karina Andrews   Chief Complaint  Patient presents with  . Pulmonary Consult    Referred by Dr. Oliver Barre. Pt c/o SOB x 6 wks. She gets SOB with talking and when she goes shopping.   baseline able to do some grocery shopping then gradually more sob just with room to room walking, not at rest or sleeping, minimal am cough /congestion no change in baseline other than the doe.  No assoc ex cp or pressyncope rec Plan A = Automatic = Symbicort 160 Take 2 puffs first thing in am and then another 2 puffs about 12 hours later.  Work on inhaler technique:   Plan B = Backup Only use your albuterol as a rescue medication Your heart valve appears to be your limiting problem at present  the key is to stop smoking completely before smoking completely stops you!  We will see you in 3 months to follow up on the lung nodules but these are the least of your problems   08/03/2015  f/u ov/Karina Andrews re: GOLD I / still smoking/ only on symbicort and no need for spiriva/saba Chief Complaint  Patient presents with  . Follow-up  sleeping ok no 02 s am cough congestion Mostly in w/c but able to walk in home with w/c very slowly acoss room  Doe = MMRC4  = sob if tries to leave home or while getting dressed  Turned down for TAVR  No obvious day to day or daytime variability or assoc excess/ purulent sputum or mucus plugs or hemoptysis or cp or chest tightness, subjective wheeze or overt sinus or hb symptoms. No unusual exp hx or h/o childhood pna/ asthma or knowledge of premature birth.  Sleeping ok without nocturnal  or early am  exacerbation  of respiratory  c/o's or need for noct saba. Also denies any obvious fluctuation of symptoms with weather or environmental changes or other aggravating or alleviating factors except as outlined above   Current Medications, Allergies, Complete Past Medical History, Past Surgical History, Family History, and Social History were reviewed in [employer] record.  ROS  The following are not active complaints unless bolded sore throat, dysphagia, dental problems, itching, sneezing,  nasal congestion or excess/ purulent secretions, ear ache,   fever, chills, sweats, unintended wt loss, classically pleuritic or exertional cp,  orthopnea pnd or leg swelling, presyncope, palpitations, abdominal pain, anorexia, nausea, vomiting, diarrhea  or change in bowel or bladder habits, change in stools or urine, dysuria,hematuria,  rash, arthralgias, visual complaints, headache, numbness, weakness or ataxia or problems with walking or coordination and falls s walker,  change in mood/affect or memory.                        Objective:   Physical Exam  Frail elderly wf nad halting one word speech  Wt Readings from Last 3 Encounters:  08/03/15 124 lb (56.246 kg)  06/27/15 135 lb (61.236 kg)  06/21/15 131 lb 12.8 oz (59.784 kg)  Vital signs reviewed     HEENT: nl  turbinates, and oropharynx. Nl external ear canals without cough reflex - upper and lower dentures    NECK :  without JVD/Nodes/TM/ nl carotid upstrokes bilaterally   LUNGS: no acc muscle use,  slt barrel contour chest with distant bs bilaterally/ no wheezing/  without cough on insp or exp maneuvers   CV:  RRR  no s3  - GIII/VI  SEM with decrease S2 but no increase in P2, no edema   ABD:  soft and nontender with nl inspiratory excursion in the supine position. No bruits or organomegaly, bowel sounds nl  MS:  Nl gait/ ext warm without deformities, calf tenderness, cyanosis or clubbing No obvious joint  restrictions   SKIN: warm and dry without lesions    NEURO:  alert, approp, nl sensorium with  no motor deficits       CXR PA and Lateral:   08/03/2015 :    I personally reviewed images and agree with radiology impression as follows:    Stable emphysematous change. Heart size and vascular pattern normal. Mild to moderate diffuse interstitial prominence stable. Deformities from previous right rib fractures stable. 1 cm nodular opacity right upper lobe and lateral right lower lobe new from prior study The lateral right lower is more c/w healing rib fx as is quite different/ larger than the CT   documented lesion above it and new since fell in 05/2015       Assessment & Plan:

## 2015-08-03 NOTE — Patient Instructions (Addendum)
Ok to leave off spiriva   Please remember to go to the x-ray department downstairs for your tests - we will call you with the results when they are available.  Please schedule a follow up visit in 3 months but call sooner if needed with cxr on return

## 2015-08-04 DIAGNOSIS — J449 Chronic obstructive pulmonary disease, unspecified: Secondary | ICD-10-CM | POA: Insufficient documentation

## 2015-08-04 NOTE — Assessment & Plan Note (Signed)
PFT's  03/18/08   FEV1 1.65 (88 % ) ratio 52  p 0 % improvement from saba with DLCO  71 % corrects to 83  % for alv volume   - Spirometry 05/04/2015  Not reproducible, f/v loop not physiolgic - d/c spiriva 08/03/2015 (not taking it anyway)  No sign AB component, really not limited by copd so much as frailty/ AS/ geriatric decline with fall risk > try to keep rx simple and just use symb 160 2bid and prn saba and eliminate Cigs (see separate a/p)

## 2015-08-04 NOTE — Assessment & Plan Note (Signed)
>   3 min Has set quit date and needs to start chantix 1 weeks prior to that/ reviewed/ encouraged.

## 2015-08-04 NOTE — Assessment & Plan Note (Signed)
Echo 09/30/13: Left ventricle: The cavity size was normal. Systolic function was vigorous. The estimated ejection fraction was in the range of 65% to 70%. Wall motion was normal; there were no regional wall motion abnormalities. - Aortic valve: Mildly to moderately calcified annulus. Trileaflet; mildly thickened, mildly calcified leaflets. Cusp separation was reduced. Transvalvular velocity was abnormal, due to stenosis. There was moderate stenosis. Peak velocity (S): 333 cm/s. Mean gradient (S): 28 mm Hg. Valve area (VTI): 1.56 cm^2. Valve area (Vmax): 1.36 cm^2. - Mitral valve: Calcified annulus. ECHO  04/26/15 Left ventricle: The cavity size was normal. Wall thickness was  normal. Systolic function was normal. The estimated ejection  fraction was in the range of 60% to 65%. Wall motion was normal;  there were no regional wall motion abnormalities. Doppler  parameters are consistent with abnormal left ventricular  relaxation (grade 1 diastolic dysfunction). Doppler parameters  are consistent with high ventricular filling pressure. - Aortic valve: There was severe stenosis. There was trivial  regurgitation. Mean gradient (S): 44 mm Hg. Peak gradient (S): 71  mm Hg. Valve area (VTI): 0.75 cm^2. Valve area (Vmax): 0.78 cm^2.  Valve area (Vmean): 0.75 cm^2. - Mitral valve: Calcified annulus.>>> turned down for intervention by Drs Turner/Cooper  May need to be shifted entirely to palliative care if condition worsens as there is no medical rx for critical AS

## 2015-08-04 NOTE — Assessment & Plan Note (Addendum)
There has been minimal change in the RUL and the RLL/lateral density is clearly a callous from rib frx, not a new nodule large than the other ones in the upper lobe  Remains asymptomatic from the nodules and very seriously chronically ill from the Severe AS/ COPD/ geriactric decline so do not feel it at all likely that these will ever contribute in a significant way to M and M whereas attempting to bx or treat likely would be very poorly tolerated  Discussed in detail all the  indications, usual  risks and alternatives  relative to the benefits with patient and daughter who agree  to proceed with conservative f/u as outlined with just f/u cxr's, not ct, to correlate with any symptoms that develop in the interim.  Total of 1839m/40 min ov spent in counseling.

## 2015-08-06 DIAGNOSIS — S12001D Unspecified nondisplaced fracture of first cervical vertebra, subsequent encounter for fracture with routine healing: Secondary | ICD-10-CM | POA: Diagnosis not present

## 2015-08-06 DIAGNOSIS — S2241XD Multiple fractures of ribs, right side, subsequent encounter for fracture with routine healing: Secondary | ICD-10-CM | POA: Diagnosis not present

## 2015-08-06 DIAGNOSIS — S0242XD Fracture of alveolus of maxilla, subsequent encounter for fracture with routine healing: Secondary | ICD-10-CM | POA: Diagnosis not present

## 2015-08-06 DIAGNOSIS — W19XXXD Unspecified fall, subsequent encounter: Secondary | ICD-10-CM | POA: Diagnosis not present

## 2015-08-06 DIAGNOSIS — S42191D Fracture of other part of scapula, right shoulder, subsequent encounter for fracture with routine healing: Secondary | ICD-10-CM | POA: Diagnosis not present

## 2015-08-06 DIAGNOSIS — R2689 Other abnormalities of gait and mobility: Secondary | ICD-10-CM | POA: Diagnosis not present

## 2015-08-06 NOTE — Progress Notes (Signed)
Quick Note:  LMTCB ______ 

## 2015-08-08 NOTE — Progress Notes (Signed)
Quick Note:  lmtcb ______ 

## 2015-08-09 DIAGNOSIS — S42191D Fracture of other part of scapula, right shoulder, subsequent encounter for fracture with routine healing: Secondary | ICD-10-CM | POA: Diagnosis not present

## 2015-08-09 DIAGNOSIS — R2689 Other abnormalities of gait and mobility: Secondary | ICD-10-CM | POA: Diagnosis not present

## 2015-08-09 DIAGNOSIS — S12001D Unspecified nondisplaced fracture of first cervical vertebra, subsequent encounter for fracture with routine healing: Secondary | ICD-10-CM | POA: Diagnosis not present

## 2015-08-09 DIAGNOSIS — S0242XD Fracture of alveolus of maxilla, subsequent encounter for fracture with routine healing: Secondary | ICD-10-CM | POA: Diagnosis not present

## 2015-08-09 DIAGNOSIS — W19XXXD Unspecified fall, subsequent encounter: Secondary | ICD-10-CM | POA: Diagnosis not present

## 2015-08-09 DIAGNOSIS — S2241XD Multiple fractures of ribs, right side, subsequent encounter for fracture with routine healing: Secondary | ICD-10-CM | POA: Diagnosis not present

## 2015-08-10 NOTE — Progress Notes (Signed)
Quick Note:  Spoke with pt and notified of results per Dr. Wert. Pt verbalized understanding and denied any questions.  ______ 

## 2015-08-13 DIAGNOSIS — S0242XD Fracture of alveolus of maxilla, subsequent encounter for fracture with routine healing: Secondary | ICD-10-CM | POA: Diagnosis not present

## 2015-08-13 DIAGNOSIS — S42191D Fracture of other part of scapula, right shoulder, subsequent encounter for fracture with routine healing: Secondary | ICD-10-CM | POA: Diagnosis not present

## 2015-08-13 DIAGNOSIS — S2241XD Multiple fractures of ribs, right side, subsequent encounter for fracture with routine healing: Secondary | ICD-10-CM | POA: Diagnosis not present

## 2015-08-13 DIAGNOSIS — S12001D Unspecified nondisplaced fracture of first cervical vertebra, subsequent encounter for fracture with routine healing: Secondary | ICD-10-CM | POA: Diagnosis not present

## 2015-08-13 DIAGNOSIS — W19XXXD Unspecified fall, subsequent encounter: Secondary | ICD-10-CM | POA: Diagnosis not present

## 2015-08-13 DIAGNOSIS — R2689 Other abnormalities of gait and mobility: Secondary | ICD-10-CM | POA: Diagnosis not present

## 2015-08-15 ENCOUNTER — Ambulatory Visit: Payer: Commercial Managed Care - HMO | Admitting: Internal Medicine

## 2015-08-16 DIAGNOSIS — W19XXXD Unspecified fall, subsequent encounter: Secondary | ICD-10-CM | POA: Diagnosis not present

## 2015-08-16 DIAGNOSIS — S12001A Unspecified nondisplaced fracture of first cervical vertebra, initial encounter for closed fracture: Secondary | ICD-10-CM | POA: Diagnosis not present

## 2015-08-16 DIAGNOSIS — S12001D Unspecified nondisplaced fracture of first cervical vertebra, subsequent encounter for fracture with routine healing: Secondary | ICD-10-CM | POA: Diagnosis not present

## 2015-08-16 DIAGNOSIS — S42191D Fracture of other part of scapula, right shoulder, subsequent encounter for fracture with routine healing: Secondary | ICD-10-CM | POA: Diagnosis not present

## 2015-08-16 DIAGNOSIS — S2241XD Multiple fractures of ribs, right side, subsequent encounter for fracture with routine healing: Secondary | ICD-10-CM | POA: Diagnosis not present

## 2015-08-16 DIAGNOSIS — R2689 Other abnormalities of gait and mobility: Secondary | ICD-10-CM | POA: Diagnosis not present

## 2015-08-16 DIAGNOSIS — I10 Essential (primary) hypertension: Secondary | ICD-10-CM | POA: Diagnosis not present

## 2015-08-16 DIAGNOSIS — S0242XD Fracture of alveolus of maxilla, subsequent encounter for fracture with routine healing: Secondary | ICD-10-CM | POA: Diagnosis not present

## 2015-08-17 DIAGNOSIS — S12001D Unspecified nondisplaced fracture of first cervical vertebra, subsequent encounter for fracture with routine healing: Secondary | ICD-10-CM | POA: Diagnosis not present

## 2015-08-17 DIAGNOSIS — S2241XD Multiple fractures of ribs, right side, subsequent encounter for fracture with routine healing: Secondary | ICD-10-CM | POA: Diagnosis not present

## 2015-08-17 DIAGNOSIS — R2689 Other abnormalities of gait and mobility: Secondary | ICD-10-CM | POA: Diagnosis not present

## 2015-08-17 DIAGNOSIS — S0242XD Fracture of alveolus of maxilla, subsequent encounter for fracture with routine healing: Secondary | ICD-10-CM | POA: Diagnosis not present

## 2015-08-17 DIAGNOSIS — S42191D Fracture of other part of scapula, right shoulder, subsequent encounter for fracture with routine healing: Secondary | ICD-10-CM | POA: Diagnosis not present

## 2015-08-17 DIAGNOSIS — W19XXXD Unspecified fall, subsequent encounter: Secondary | ICD-10-CM | POA: Diagnosis not present

## 2015-08-20 DIAGNOSIS — R2689 Other abnormalities of gait and mobility: Secondary | ICD-10-CM | POA: Diagnosis not present

## 2015-08-20 DIAGNOSIS — W19XXXD Unspecified fall, subsequent encounter: Secondary | ICD-10-CM | POA: Diagnosis not present

## 2015-08-20 DIAGNOSIS — S2241XD Multiple fractures of ribs, right side, subsequent encounter for fracture with routine healing: Secondary | ICD-10-CM | POA: Diagnosis not present

## 2015-08-20 DIAGNOSIS — S42191D Fracture of other part of scapula, right shoulder, subsequent encounter for fracture with routine healing: Secondary | ICD-10-CM | POA: Diagnosis not present

## 2015-08-20 DIAGNOSIS — S12001D Unspecified nondisplaced fracture of first cervical vertebra, subsequent encounter for fracture with routine healing: Secondary | ICD-10-CM | POA: Diagnosis not present

## 2015-08-20 DIAGNOSIS — S0242XD Fracture of alveolus of maxilla, subsequent encounter for fracture with routine healing: Secondary | ICD-10-CM | POA: Diagnosis not present

## 2015-08-22 ENCOUNTER — Ambulatory Visit (INDEPENDENT_AMBULATORY_CARE_PROVIDER_SITE_OTHER): Payer: Commercial Managed Care - HMO | Admitting: Internal Medicine

## 2015-08-22 ENCOUNTER — Encounter: Payer: Self-pay | Admitting: Internal Medicine

## 2015-08-22 VITALS — BP 136/74 | HR 89 | Temp 98.3°F | Resp 20 | Wt 134.0 lb

## 2015-08-22 DIAGNOSIS — F32A Depression, unspecified: Secondary | ICD-10-CM

## 2015-08-22 DIAGNOSIS — E785 Hyperlipidemia, unspecified: Secondary | ICD-10-CM

## 2015-08-22 DIAGNOSIS — I10 Essential (primary) hypertension: Secondary | ICD-10-CM

## 2015-08-22 DIAGNOSIS — F329 Major depressive disorder, single episode, unspecified: Secondary | ICD-10-CM | POA: Diagnosis not present

## 2015-08-22 DIAGNOSIS — Z720 Tobacco use: Secondary | ICD-10-CM | POA: Diagnosis not present

## 2015-08-22 DIAGNOSIS — R269 Unspecified abnormalities of gait and mobility: Secondary | ICD-10-CM | POA: Insufficient documentation

## 2015-08-22 DIAGNOSIS — F17210 Nicotine dependence, cigarettes, uncomplicated: Secondary | ICD-10-CM

## 2015-08-22 MED ORDER — BUDESONIDE-FORMOTEROL FUMARATE 160-4.5 MCG/ACT IN AERO: 2.0000 | INHALATION_SPRAY | Freq: Two times a day (BID) | RESPIRATORY_TRACT | Status: DC

## 2015-08-22 MED ORDER — ASPIRIN EC 81 MG PO TBEC: 81.0000 mg | DELAYED_RELEASE_TABLET | Freq: Every day | ORAL | Status: DC

## 2015-08-22 MED ORDER — VARENICLINE TARTRATE 1 MG PO TABS: 1.0000 mg | ORAL_TABLET | Freq: Two times a day (BID) | ORAL | Status: DC

## 2015-08-22 MED ORDER — ROSUVASTATIN CALCIUM 10 MG PO TABS: 10.0000 mg | ORAL_TABLET | Freq: Every day | ORAL | Status: DC

## 2015-08-22 MED ORDER — HYDROXYZINE HCL 50 MG PO TABS: ORAL_TABLET | ORAL | Status: DC

## 2015-08-22 MED ORDER — ALBUTEROL SULFATE HFA 108 (90 BASE) MCG/ACT IN AERS: 2.0000 | INHALATION_SPRAY | Freq: Four times a day (QID) | RESPIRATORY_TRACT | Status: DC | PRN

## 2015-08-22 MED ORDER — VARENICLINE TARTRATE 0.5 MG X 11 & 1 MG X 42 PO MISC: ORAL | Status: DC

## 2015-08-22 MED ORDER — LOSARTAN POTASSIUM 50 MG PO TABS
50.0000 mg | ORAL_TABLET | Freq: Every day | ORAL | Status: DC
Start: 2015-08-22 — End: 2016-07-07

## 2015-08-22 NOTE — Assessment & Plan Note (Addendum)
stable overall by history and exam, recent data reviewed with pt, and pt to continue medical treatment as before,  to f/u any worsening symptoms or concerns, Also start asa 81 qd BP Readings from Last 3 Encounters:  08/22/15 136/74  08/03/15 104/60  06/27/15 120/60

## 2015-08-22 NOTE — Patient Instructions (Addendum)
OK to start the Aspirin 81 mg - 1 per day - Coated only  Please take all new medication as prescribed - the Chantix  Please continue all other medications as before, and refills have been done if requested.  Please have the pharmacy call with any other refills you may need.  Please keep your appointments with your specialists as you may have planned  You are given the prescriptions for the Sleep number type bed, as well as the Scooter  Please return in 6 months, or sooner if needed

## 2015-08-22 NOTE — Progress Notes (Signed)
Pre visit review using our clinic review tool, if applicable. No additional management support is needed unless otherwise documented below in the visit note. 

## 2015-08-22 NOTE — Progress Notes (Signed)
Subjective:    Patient ID: Karina Andrews, female    DOB: 03-16-1930, 82 y.o.   MRN: [id number]  HPI  Here to f/u with daughter, has had neuro eval with no evidence of stroke, but still with unusual voice cadence as well as chronic neck pain, gait disorder and chronic smoking.  Pt  now needs assist1ed living, needs to move up from independent living.  Pt denies chest pain, increased sob or doe, wheezing, orthopnea, PND, increased LE swelling, palpitations, dizziness or syncope.  Pt denies new neurological symptoms such as new headache, or facial or extremity weakness or numbness   Pt denies polydipsia, polyuria.  Asks for chantix to help quit smoking, as she is ready to try.  Denies worsening depressive symptoms, suicidal ideation, or panic Past Medical History  Diagnosis Date  . Hypertension   . Depression   . Osteoporosis     (R) hip fx 11/2009 & (L) hip fx 2006  . COPD (chronic obstructive pulmonary disease) (HCC)   . Cervical spondylosis with myelopathy 01/2009    s/p decompression  . Aortic stenosis     moderate on echo 09/2013  . Alcohol abuse    Past Surgical History  Procedure Laterality Date  . Bladder tuck    . Cholecystectomy  1995  . Posterior laminectomy / decompression cervical spine  01/2009    Done by Dr. Danielle DessElsner  . Right hip  11/2009    ORIF/ Done by Dr. Turner Danielsowan  . Cataract extraction Right 06/06/13    reports that she has been smoking Cigarettes.  She has a 26.5 pack-year smoking history. She has never used smokeless tobacco. She reports that she drinks alcohol. She reports that she does not use illicit drugs. family history includes Heart attack in her father. Allergies  Allergen Reactions  . Ativan [Lorazepam] Other (See Comments)    Causes increased confusion/AMS   Current Outpatient Prescriptions on File Prior to Visit  Medication Sig Dispense Refill  . ipratropium-albuterol (DUONEB) 0.5-2.5 (3) MG/3ML SOLN Take 3 mLs by nebulization every 6 (six) hours as  needed (if inhaler does not resolve wheezing). 360 mL 0   No current facility-administered medications on file prior to visit.    Review of Systems  Constitutional: Negative for unusual diaphoresis or night sweats HENT: Negative for ear swelling or discharge Eyes: Negative for worsening visual haziness  Respiratory: Negative for choking and stridor.   Gastrointestinal: Negative for distension or worsening eructation Genitourinary: Negative for retention or change in urine volume.  Musculoskeletal: Negative for other MSK pain or swelling Skin: Negative for color change and worsening wound Neurological: Negative for tremors and numbness other than noted  Psychiatric/Behavioral: Negative for decreased concentration or agitation other than above       Objective:   Physical Exam BP 136/74 mmHg  Pulse 89  Temp(Src) 98.3 F (36.8 C) (Oral)  Resp 20  Wt 134 lb (60.782 kg)  SpO2 90% VS noted,  Constitutional: Pt appears in no apparent distress HENT: Head: NCAT.  Right Ear: External ear normal.  Left Ear: External ear normal.  Eyes: . Pupils are equal, round, and reactive to light. Conjunctivae and EOM are normal Neck: Normal range of motion. Neck supple.  Cardiovascular: Normal rate and regular rhythm.   Pulmonary/Chest: Effort normal and breath sounds without rales or wheezing.  Neurological: Pt is alert. Not confused , motor grossly intact Skin: Skin is warm. No rash, no LE edema Psychiatric: Pt behavior is normal. No agitation. not  depressed affect     Assessment & Plan:

## 2015-08-23 DIAGNOSIS — S12001D Unspecified nondisplaced fracture of first cervical vertebra, subsequent encounter for fracture with routine healing: Secondary | ICD-10-CM | POA: Diagnosis not present

## 2015-08-23 DIAGNOSIS — S42191D Fracture of other part of scapula, right shoulder, subsequent encounter for fracture with routine healing: Secondary | ICD-10-CM | POA: Diagnosis not present

## 2015-08-23 DIAGNOSIS — R2689 Other abnormalities of gait and mobility: Secondary | ICD-10-CM | POA: Diagnosis not present

## 2015-08-23 DIAGNOSIS — S2241XD Multiple fractures of ribs, right side, subsequent encounter for fracture with routine healing: Secondary | ICD-10-CM | POA: Diagnosis not present

## 2015-08-23 DIAGNOSIS — W19XXXD Unspecified fall, subsequent encounter: Secondary | ICD-10-CM | POA: Diagnosis not present

## 2015-08-23 DIAGNOSIS — S0242XD Fracture of alveolus of maxilla, subsequent encounter for fracture with routine healing: Secondary | ICD-10-CM | POA: Diagnosis not present

## 2015-08-24 DIAGNOSIS — S12001D Unspecified nondisplaced fracture of first cervical vertebra, subsequent encounter for fracture with routine healing: Secondary | ICD-10-CM | POA: Diagnosis not present

## 2015-08-24 DIAGNOSIS — W19XXXD Unspecified fall, subsequent encounter: Secondary | ICD-10-CM | POA: Diagnosis not present

## 2015-08-24 DIAGNOSIS — R2689 Other abnormalities of gait and mobility: Secondary | ICD-10-CM | POA: Diagnosis not present

## 2015-08-24 DIAGNOSIS — S0242XD Fracture of alveolus of maxilla, subsequent encounter for fracture with routine healing: Secondary | ICD-10-CM | POA: Diagnosis not present

## 2015-08-24 DIAGNOSIS — S2241XD Multiple fractures of ribs, right side, subsequent encounter for fracture with routine healing: Secondary | ICD-10-CM | POA: Diagnosis not present

## 2015-08-24 DIAGNOSIS — S42191D Fracture of other part of scapula, right shoulder, subsequent encounter for fracture with routine healing: Secondary | ICD-10-CM | POA: Diagnosis not present

## 2015-08-25 DIAGNOSIS — M6281 Muscle weakness (generalized): Secondary | ICD-10-CM | POA: Diagnosis not present

## 2015-08-25 DIAGNOSIS — J449 Chronic obstructive pulmonary disease, unspecified: Secondary | ICD-10-CM | POA: Diagnosis not present

## 2015-08-25 DIAGNOSIS — Z9181 History of falling: Secondary | ICD-10-CM | POA: Diagnosis not present

## 2015-08-25 DIAGNOSIS — J441 Chronic obstructive pulmonary disease with (acute) exacerbation: Secondary | ICD-10-CM | POA: Diagnosis not present

## 2015-08-25 DIAGNOSIS — R0602 Shortness of breath: Secondary | ICD-10-CM | POA: Diagnosis not present

## 2015-08-25 NOTE — Assessment & Plan Note (Signed)
Ok for chantix asd,  to f/u any worsening symptoms or concerns 

## 2015-08-25 NOTE — Assessment & Plan Note (Addendum)
stable overall by history and exam, for lower chol diet, declines statin, and pt to continue medical treatment as before,  to f/u any worsening symptoms or concerns

## 2015-08-25 NOTE — Assessment & Plan Note (Signed)
Ok for scooter, which I believe family will buy, or with insurance if covered

## 2015-08-25 NOTE — Assessment & Plan Note (Signed)
stable overall by history and exam, and pt to continue medical treatment as before,  to f/u any worsening symptoms or concerns 

## 2015-08-31 ENCOUNTER — Ambulatory Visit (INDEPENDENT_AMBULATORY_CARE_PROVIDER_SITE_OTHER): Payer: Commercial Managed Care - HMO | Admitting: Adult Health

## 2015-08-31 ENCOUNTER — Encounter: Payer: Self-pay | Admitting: Adult Health

## 2015-08-31 VITALS — BP 136/70 | HR 115

## 2015-08-31 DIAGNOSIS — W19XXXS Unspecified fall, sequela: Secondary | ICD-10-CM | POA: Diagnosis not present

## 2015-08-31 DIAGNOSIS — S3091XA Unspecified superficial injury of lower back and pelvis, initial encounter: Secondary | ICD-10-CM

## 2015-08-31 NOTE — Progress Notes (Signed)
Subjective:    Patient ID: Karina Andrews, female    DOB: 1930/02/27, 82 y.o.   MRN: [id number]  HPI  82 year old female who  has a past medical history of Hypertension; Depression; Osteoporosis; COPD (chronic obstructive pulmonary disease) (HCC); Cervical spondylosis with myelopathy (01/2009); Aortic stenosis; and Alcohol abuse. She is a patient of Dr. Jonny RuizJohn, who I am seeing s/p fall onto her wheel chair one week ago. She reports that as she was transitioning from her walker to the wheel chair she landed on a metal part of the wheel chair. She had pain on her right buttocks. Her daughter who is with her would like to make sure that the area is not infected or needs to be imaged. The daughter reports bruising noted but that seems to be resolved.   Patient did not hit her head on anything, denies any pain with palpation or issues with bowel movements.   Daughter has been applying hydrocortisone cream daily.    Review of Systems  Constitutional: Negative.   Musculoskeletal: Positive for arthralgias and gait problem.  Skin: Positive for color change and wound. Negative for pallor and rash.  All other systems reviewed and are negative.  Past Medical History  Diagnosis Date  . Hypertension   . Depression   . Osteoporosis     (R) hip fx 11/2009 & (L) hip fx 2006  . COPD (chronic obstructive pulmonary disease) (HCC)   . Cervical spondylosis with myelopathy 01/2009    s/p decompression  . Aortic stenosis     moderate on echo 09/2013  . Alcohol abuse     Social History   Social History  . Marital Status: Single    Spouse Name: N/A  . Number of Children: N/A  . Years of Education: N/A   Occupational History  . Not on file.   Social History Main Topics  . Smoking status: Current Every Day Smoker -- 0.50 packs/day for 53 years    Types: Cigarettes  . Smokeless tobacco: Never Used  . Alcohol Use: 0.0 oz/week    0 Standard drinks or equivalent per week  . Drug Use: No  .  Sexual Activity: Not on file   Other Topics Concern  . Not on file   Social History Narrative    Past Surgical History  Procedure Laterality Date  . Bladder tuck    . Cholecystectomy  1995  . Posterior laminectomy / decompression cervical spine  01/2009    Done by Dr. Danielle DessElsner  . Right hip  11/2009    ORIF/ Done by Dr. Turner Danielsowan  . Cataract extraction Right 06/06/13    Family History  Problem Relation Age of Onset  . Heart attack Father     Allergies  Allergen Reactions  . Ativan [Lorazepam] Other (See Comments)    Causes increased confusion/AMS    Current Outpatient Prescriptions on File Prior to Visit  Medication Sig Dispense Refill  . albuterol (VENTOLIN HFA) 108 (90 Base) MCG/ACT inhaler Inhale 2 puffs into the lungs every 6 (six) hours as needed for wheezing or shortness of breath. 18 g 11  . aspirin EC 81 MG tablet Take 1 tablet (81 mg total) by mouth daily. 90 tablet 11  . budesonide-formoterol (SYMBICORT) 160-4.5 MCG/ACT inhaler Inhale 2 puffs into the lungs 2 (two) times daily. 30.6 g 3  . hydrOXYzine (ATARAX/VISTARIL) 50 MG tablet 1/2 tablet by mouth three times daily as needed for anxiety 90 tablet 3  . ipratropium-albuterol (DUONEB) 0.5-2.5 (  3) MG/3ML SOLN Take 3 mLs by nebulization every 6 (six) hours as needed (if inhaler does not resolve wheezing). 360 mL 0  . losartan (COZAAR) 50 MG tablet Take 1 tablet (50 mg total) by mouth daily. 90 tablet 3  . rosuvastatin (CRESTOR) 10 MG tablet Take 1 tablet (10 mg total) by mouth daily. 90 tablet 3  . varenicline (CHANTIX CONTINUING MONTH PAK) 1 MG tablet Take 1 tablet (1 mg total) by mouth 2 (two) times daily. 60 tablet 1  . varenicline (CHANTIX STARTING MONTH PAK) 0.5 MG X 11 & 1 MG X 42 tablet Take one 0.5 mg tablet by mouth once daily for 3 days, then increase to one 0.5 mg tablet twice daily for 4 days, then increase to one 1 mg tablet twice daily. 53 tablet 0   No current facility-administered medications on file prior  to visit.    BP 136/70 mmHg  Pulse 115  Wt   SpO2 93%       Objective:   Physical Exam  Constitutional: She is oriented to person, place, and time. She appears well-developed and well-nourished. No distress.  Cardiovascular: Exam reveals no friction rub.   Pulmonary/Chest: Effort normal and breath sounds normal.  Musculoskeletal:  Using a rolling walker  Neurological: She is alert and oriented to person, place, and time.  Skin: Skin is warm and dry. No rash noted. She is not diaphoretic. No erythema. No pallor.  Small superficial abrasion noted on right buttocks, this appears well healed. No bruising noted. No pain with palpation   Psychiatric: She has a normal mood and affect. Her behavior is normal. Judgment and thought content normal.  Nursing note and vitals reviewed.     Assessment & Plan:  1. Fall, sequela - small superficial wound that appears almost healed. There is no bruising - Try and keep pressure off area when sitting in wheel chair - Continue to monitor and follow up with PCP if needed  Shirline Freesory Jeraldin Fesler, NP

## 2015-09-06 ENCOUNTER — Telehealth: Payer: Self-pay | Admitting: Internal Medicine

## 2015-09-06 ENCOUNTER — Encounter: Payer: Self-pay | Admitting: Internal Medicine

## 2015-09-06 DIAGNOSIS — F0390 Unspecified dementia without behavioral disturbance: Secondary | ICD-10-CM

## 2015-09-06 HISTORY — DX: Unspecified dementia, unspecified severity, without behavioral disturbance, psychotic disturbance, mood disturbance, and anxiety: F03.90

## 2015-09-06 NOTE — Telephone Encounter (Signed)
Please advise 

## 2015-09-06 NOTE — Telephone Encounter (Signed)
Pt's Daughter, [name], came by office and is requesting an order for PT, OT & ST for in-house [facility].  Also, they are also in need of a recommendation for quantity of wine pt can consume daily.  Daughter feels completely comfortable with a glass of wine a night, but [facility] needs a recommendation from you (they say usually it is 4-6 ounces).  Please specify ounces per evening pt can have.  Please send this information to Hinda Glatterywan Burney, RN at [facility], Fax # [phone], Ph # is [phone], Ext [phone].

## 2015-09-06 NOTE — Telephone Encounter (Signed)
Done hardcopy to Corinne  

## 2015-09-07 NOTE — Telephone Encounter (Signed)
Prescriptions faxed.  

## 2015-09-10 ENCOUNTER — Telehealth: Payer: Self-pay | Admitting: Internal Medicine

## 2015-09-10 NOTE — Telephone Encounter (Signed)
[facility], [facility] [place] requesting tylenol for neck pain, no fever, no severe sx  rec tylenol 500 mg  2 po tid prn Call pcp if no better  CC PCP

## 2015-09-25 DIAGNOSIS — J441 Chronic obstructive pulmonary disease with (acute) exacerbation: Secondary | ICD-10-CM | POA: Diagnosis not present

## 2015-09-25 DIAGNOSIS — R0602 Shortness of breath: Secondary | ICD-10-CM | POA: Diagnosis not present

## 2015-09-25 DIAGNOSIS — J449 Chronic obstructive pulmonary disease, unspecified: Secondary | ICD-10-CM | POA: Diagnosis not present

## 2015-09-25 DIAGNOSIS — Z9181 History of falling: Secondary | ICD-10-CM | POA: Diagnosis not present

## 2015-09-25 DIAGNOSIS — M6281 Muscle weakness (generalized): Secondary | ICD-10-CM | POA: Diagnosis not present

## 2015-09-26 ENCOUNTER — Telehealth: Payer: Self-pay | Admitting: Internal Medicine

## 2015-09-26 NOTE — Telephone Encounter (Signed)
States that mom has been taking chantix and since then she has been having acute stomach issues and throwing up.  Would like Dr. Raphael Gibney opinion on what to do.

## 2015-09-26 NOTE — Telephone Encounter (Signed)
Pts daughter called and wants to know if they can stop Chantix ASAP. She is having suicidal thoughts. Her next dose is at 5 and wants to try and get it stopped before then. [facility] Fax # is [phone]. Please follow up thanks.

## 2015-09-26 NOTE — Telephone Encounter (Signed)
Pt daughter called in and said that pt is having suicidal thoughts on this med and is stopping this med.  Daughter wants to know if pt could be given a low dose of Lexapro for her depression

## 2015-09-27 NOTE — Telephone Encounter (Signed)
Ok to stop the chantix  I would continue the lexapro, thanks

## 2015-10-02 ENCOUNTER — Telehealth: Payer: Self-pay | Admitting: Internal Medicine

## 2015-10-02 NOTE — Telephone Encounter (Signed)
Patient has not picked up 07/17/2015 script for Norco/Vicodin- disposing of it

## 2015-10-03 NOTE — Telephone Encounter (Signed)
Left message asking daughter to call me back---if I do not hear a response soon, I will try to figure out where patient's location is and call them

## 2015-10-05 ENCOUNTER — Telehealth: Payer: Self-pay | Admitting: Emergency Medicine

## 2015-10-05 ENCOUNTER — Telehealth: Payer: Self-pay

## 2015-10-05 NOTE — Telephone Encounter (Signed)
[facility] is calling about a fax that was sent over and she needs to talk to you about it. PleaseFollow up thanks.

## 2015-10-05 NOTE — Telephone Encounter (Signed)
This was already written and sent to corinne attention, thanks

## 2015-10-05 NOTE — Telephone Encounter (Signed)
I have talked with daughter, Karina Andrews to see who's attention to put faxed orders to----I will be faxing orders already given by dr Jonny Ruizjohn to stop chantix and ok to continue lexapro----to nurse [place]--at [facility]---fax #[phone]----routing to dr john---patient's daughter, Karina Andrews is also requesting an order for nicotine patch if dr Jonny Ruizjohn thinks ok to use----please advise, I will fax order over if appropriate, thanks

## 2015-10-11 ENCOUNTER — Telehealth: Payer: Self-pay

## 2015-10-11 NOTE — Telephone Encounter (Signed)
Dr. Jonny RuizJohn, This patient had fx ribs from fall 05/30/15 and has fallen since. Now in [facility]; ? Assisted living?  She needs repeat DEXA per guideline if you feel this is appropriate. Should I schedule fup with you?  Please advise Tks

## 2015-10-11 NOTE — Telephone Encounter (Signed)
I think she has missed a few appts  OK to schedule dxa if due

## 2015-10-12 NOTE — Telephone Encounter (Signed)
Call to Karina Andrews and spoke with Dtr, hipaa on file Karina. Blondell Andrews is at facility but Dr. Jonny RuizJohn is still her PCP .  Explained that due to fx; Dr. Jonny RuizJohn may recommend she have a Dexa scan to determine bone density and if low, may decide to treat. Stated her mother was currently following with Pulmonology but as soon as she follows up with them will schedule apt with Dr.John to discuss bone density exam and fup .

## 2015-10-24 NOTE — Telephone Encounter (Signed)
Has the order been put in for DEXA and or has pt had this scan?  Does she need to be called to sch this ?

## 2015-10-26 DIAGNOSIS — R0602 Shortness of breath: Secondary | ICD-10-CM | POA: Diagnosis not present

## 2015-10-26 DIAGNOSIS — M6281 Muscle weakness (generalized): Secondary | ICD-10-CM | POA: Diagnosis not present

## 2015-10-26 DIAGNOSIS — Z9181 History of falling: Secondary | ICD-10-CM | POA: Diagnosis not present

## 2015-10-26 DIAGNOSIS — J441 Chronic obstructive pulmonary disease with (acute) exacerbation: Secondary | ICD-10-CM | POA: Diagnosis not present

## 2015-10-26 DIAGNOSIS — J449 Chronic obstructive pulmonary disease, unspecified: Secondary | ICD-10-CM | POA: Diagnosis not present

## 2015-10-30 NOTE — Telephone Encounter (Signed)
I spoke to Ms. Karina Andrews. She stated clearly that she would make an apt with Dr. Jonny RuizJohn prior to scheduling the DEXA scan so this is why I did not schedule. She declined at the time due to her mother's other medical priorities.

## 2015-10-30 NOTE — Telephone Encounter (Signed)
Call to the dtr and made one more outreach to discuss DEXA;  FX in April; Need to repeat DEXA within 6 monthns

## 2015-11-09 ENCOUNTER — Ambulatory Visit (INDEPENDENT_AMBULATORY_CARE_PROVIDER_SITE_OTHER): Payer: Commercial Managed Care - HMO | Admitting: Internal Medicine

## 2015-11-09 ENCOUNTER — Telehealth: Payer: Self-pay | Admitting: Internal Medicine

## 2015-11-09 ENCOUNTER — Encounter: Payer: Self-pay | Admitting: Internal Medicine

## 2015-11-09 ENCOUNTER — Ambulatory Visit (INDEPENDENT_AMBULATORY_CARE_PROVIDER_SITE_OTHER)
Admission: RE | Admit: 2015-11-09 | Discharge: 2015-11-09 | Disposition: A | Discharge status: Home / Self Care | Payer: Commercial Managed Care - HMO | Source: Ambulatory Visit | Attending: Internal Medicine | Admitting: Internal Medicine

## 2015-11-09 VITALS — BP 128/74 | HR 95 | Ht 63.0 in | Wt 130.0 lb

## 2015-11-09 DIAGNOSIS — J449 Chronic obstructive pulmonary disease, unspecified: Secondary | ICD-10-CM

## 2015-11-09 DIAGNOSIS — J984 Other disorders of lung: Secondary | ICD-10-CM | POA: Diagnosis not present

## 2015-11-09 DIAGNOSIS — R918 Other nonspecific abnormal finding of lung field: Secondary | ICD-10-CM

## 2015-11-09 DIAGNOSIS — R0602 Shortness of breath: Secondary | ICD-10-CM | POA: Diagnosis not present

## 2015-11-09 NOTE — Patient Instructions (Addendum)
Please schedule a follow up visit in 3 months but call sooner if needed with cxr  

## 2015-11-09 NOTE — Telephone Encounter (Signed)
error 

## 2015-11-09 NOTE — Progress Notes (Signed)
Subjective:   Patient ID: Karina Andrews, female    DOB: 01-18-1931     MRN: [id number]    Brief patient profile:  51 yowf active smoker with GOLD I copd 07/2008 maint on symbicort 160 2 bid and prn albuterol with increase doe x Jan 2017 > CT c/w mpn's so referred to pulmonary clinic 05/04/2015 by Dr Oliver Barre     History of Present Illness  05/04/2015 1st [facility] office visit/ Jermine Bibbee   Chief Complaint  Patient presents with  . Pulmonary Consult    Referred by Dr. Oliver Barre. Pt c/o SOB x 6 wks. She gets SOB with talking and when she goes shopping.   baseline able to do some grocery shopping then gradually more sob just with room to room walking, not at rest or sleeping, minimal am cough /congestion no change in baseline other than the doe.  No assoc ex cp or pressyncope rec Plan A = Automatic = Symbicort 160 Take 2 puffs first thing in am and then another 2 puffs about 12 hours later.  Work on inhaler technique:   Plan B = Backup Only use your albuterol as a rescue medication Your heart valve appears to be your limiting problem at present  the key is to stop smoking completely before smoking completely stops you!  We will see you in 3 months to follow up on the lung nodules but these are the least of your problems   08/03/2015  f/u ov/Bretton Tandy re: GOLD I / still smoking/ only on symbicort and no need for spiriva/saba Chief Complaint  Patient presents with  . Follow-up  sleeping ok no 02 s am cough congestion Mostly in w/c but able to walk in home with w/c very slowly acoss room  Doe = MMRC4  = sob if tries to leave home or while getting dressed  Turned down for TAVR rec Ok to leave off spiriva  Please remember to go to the x-ray department downstairs for your tests - we will call you with the results when they are available.    11/09/2015  f/u ov/Gracia Saggese re: COPD GOLD I/ last smoked September 10 2015 / rx maint symbicort no change off spiriva  Chief Complaint  Patient presents  with  . Follow-up    88mo rov. pt had CXR today. pt c/o sob w/exertion & prod cough with yellow mucus.  continues MMRC 4 level sob and speaking in on - two word phrases    No obvious day to day or daytime variability or assoc excess/ purulent sputum or mucus plugs or hemoptysis or cp or chest tightness, subjective wheeze or overt sinus or hb symptoms. No unusual exp hx or h/o childhood pna/ asthma or knowledge of premature birth.  Sleeping ok without nocturnal  or early am exacerbation  of respiratory  c/o's or need for noct saba. Also denies any obvious fluctuation of symptoms with weather or environmental changes or other aggravating or alleviating factors except as outlined above   Current Medications, Allergies, Complete Past Medical History, Past Surgical History, Family History, and Social History were reviewed in [employer] record.  ROS  The following are not active complaints unless bolded sore throat, dysphagia, dental problems, itching, sneezing,  nasal congestion or excess/ purulent secretions, ear ache,   fever, chills, sweats, unintended wt loss, classically pleuritic or exertional cp,  orthopnea pnd or leg swelling, presyncope, palpitations, abdominal pain, anorexia, nausea, vomiting, diarrhea  or change in bowel or bladder  habits, change in stools or urine, dysuria,hematuria,  rash, arthralgias, visual complaints, headache, numbness, weakness or ataxia or problems with walking or coordination and falls s walker,  change in mood/affect or memory.                        Objective:   Physical Exam  Frail elderly wf nad halting one-two  word speech  Wt Readings from Last 3 Encounters:  08/03/15 124 lb (56.246 kg)  06/27/15 135 lb (61.236 kg)  06/21/15 131 lb 12.8 oz (59.784 kg)    Vital signs reviewed / sats 96% on RA    HEENT: nl  turbinates, and oropharynx. Nl external ear canals without cough reflex - upper and lower dentures    NECK :   without JVD/Nodes/TM/ nl carotid upstrokes bilaterally   LUNGS: no acc muscle use,  slt barrel contour chest with distant bs bilaterally/ no wheezing/  without cough on insp or exp maneuvers   CV:  RRR  no s3  - GIII/VI  SEM with decrease S2 but no increase in P2, no edema   ABD:  soft and nontender with nl inspiratory excursion in the supine position. No bruits or organomegaly, bowel sounds nl  MS:  Nl gait/ ext warm without deformities, calf tenderness, cyanosis or clubbing No obvious joint restrictions   SKIN: warm and dry without lesions    NEURO:  alert, approp, nl sensorium with  no motor deficits        CXR PA and Lateral:   11/09/2015 :    I personally reviewed images and agree with radiology impression as follows:   Stable emphysematous change. Heart size and vascular pattern normal. Mild to moderate diffuse interstitial prominence stable. Deformities from previous right rib fractures stable. 1 cm nodular opacity right upper lobe and lateral right lower lobe new from prior study. No pleural effusion appear         Assessment & Plan:

## 2015-11-11 ENCOUNTER — Encounter: Payer: Self-pay | Admitting: Internal Medicine

## 2015-11-11 NOTE — Assessment & Plan Note (Signed)
PFT's  03/18/08   FEV1 1.65 (88 % ) ratio 52  p 0 % improvement from saba with DLCO  71 % corrects to 83  % for alv volume   - Spirometry 05/04/2015  Not reproducible, f/v loop not physiolgic - d/c spiriva 08/03/2015 (not taking it anyway)  - quit smoking 08/2015   No change off spiriva/ should see some improvement over next few months in symptoms, esp cough, if remains off cigs. The Severe inop AS is also likely to limit her function and prognosis independent of the MPNS/ copd/ reviewed very limited options to help with this other than consider palliative care/ hospice at some point > Follow up per Primary Care planned

## 2015-11-11 NOTE — Assessment & Plan Note (Addendum)
CT 04/20/15 first confirmed  - MPNS apparent on plain cxr 11/09/2015   Discussed in detail all the  indications, usual  risks and alternatives  relative to the benefits with patient who agrees to proceed with conservative f/u as outlined  - not interested in a bx much less chemo/ RT but willing to consider if having symptoms related to these nodules   I had an extended discussion with the patient reviewing all relevant studies completed to date and  lasting 15 to 20 minutes of a 25 minute visit    Each maintenance medication was reviewed in detail including most importantly the difference between maintenance and prns and under what circumstances the prns are to be triggered using an action plan format that is not reflected in the computer generated alphabetically organized AVS.    Please see instructions for details which were reviewed in writing and the patient given a copy highlighting the part that I personally wrote and discussed at today's ov.

## 2015-11-12 NOTE — Progress Notes (Signed)
LMTCB for Trula OreChristina, pt's daughter

## 2015-11-14 ENCOUNTER — Other Ambulatory Visit: Payer: Self-pay | Admitting: Internal Medicine

## 2015-11-14 MED ORDER — BUDESONIDE-FORMOTEROL FUMARATE 160-4.5 MCG/ACT IN AERO: 2.0000 | INHALATION_SPRAY | Freq: Two times a day (BID) | RESPIRATORY_TRACT | 0 refills | Status: DC

## 2015-11-14 NOTE — Progress Notes (Signed)
Spoke with pt and notified of results per Dr. Wert. Pt verbalized understanding and denied any questions. 

## 2015-11-25 DIAGNOSIS — R0602 Shortness of breath: Secondary | ICD-10-CM | POA: Diagnosis not present

## 2015-11-25 DIAGNOSIS — Z9181 History of falling: Secondary | ICD-10-CM | POA: Diagnosis not present

## 2015-11-25 DIAGNOSIS — M6281 Muscle weakness (generalized): Secondary | ICD-10-CM | POA: Diagnosis not present

## 2015-11-25 DIAGNOSIS — J441 Chronic obstructive pulmonary disease with (acute) exacerbation: Secondary | ICD-10-CM | POA: Diagnosis not present

## 2015-11-25 DIAGNOSIS — J449 Chronic obstructive pulmonary disease, unspecified: Secondary | ICD-10-CM | POA: Diagnosis not present

## 2015-11-26 ENCOUNTER — Telehealth: Payer: Self-pay | Admitting: Cardiology

## 2015-11-26 NOTE — Telephone Encounter (Signed)
New message    Pts dtr calling to see if she can get an earlier appt than scheduled on 12/4 due to her mother's symptoms increasing such as speech issues, etc. She needs to know when to call in hospice. Please call.

## 2015-11-26 NOTE — Telephone Encounter (Signed)
Left message that earlier appointments are available with Dr. Norris Crossurner's assistants and to call tomorrow to schedule.

## 2015-11-29 ENCOUNTER — Ambulatory Visit: Payer: Commercial Managed Care - HMO | Admitting: Neurology

## 2015-12-04 ENCOUNTER — Other Ambulatory Visit: Payer: Self-pay | Admitting: Internal Medicine

## 2015-12-04 MED ORDER — TRAZODONE HCL 50 MG PO TABS: 25.0000 mg | ORAL_TABLET | Freq: Every evening | ORAL | 3 refills | Status: DC | PRN

## 2015-12-04 NOTE — Telephone Encounter (Signed)
Pt requests med for sleep - ok for trazodone 25-50 qhs prn  [facility]inne [facility] [place]

## 2015-12-05 ENCOUNTER — Encounter: Payer: Self-pay | Admitting: Physician Assistant

## 2015-12-13 ENCOUNTER — Telehealth: Payer: Self-pay | Admitting: Internal Medicine

## 2015-12-13 NOTE — Telephone Encounter (Signed)
Faxed over request for a prescription.  Patient has not been able to sleep.  Is requesting to be faxed back to [phone].

## 2015-12-17 ENCOUNTER — Telehealth: Payer: Self-pay | Admitting: Emergency Medicine

## 2015-12-17 MED ORDER — TRAZODONE HCL 50 MG PO TABS: 25.0000 mg | ORAL_TABLET | Freq: Every evening | ORAL | 3 refills | Status: DC | PRN

## 2015-12-17 NOTE — Telephone Encounter (Signed)
Pts daughter called and asked about the patients sleep aid medicine. TraZODone (DESYREL) 50 MG [facility] [phone]. Please advise thanks.

## 2015-12-17 NOTE — Telephone Encounter (Signed)
Printed script & [facility] to [facility]...Karina Andrews/lmb

## 2015-12-18 ENCOUNTER — Ambulatory Visit: Payer: Commercial Managed Care - HMO | Admitting: Physician Assistant

## 2015-12-18 ENCOUNTER — Other Ambulatory Visit: Payer: Self-pay | Admitting: Internal Medicine

## 2015-12-18 MED ORDER — TRAZODONE HCL 50 MG PO TABS: 50.0000 mg | ORAL_TABLET | Freq: Every evening | ORAL | 5 refills | Status: DC | PRN

## 2015-12-18 NOTE — Progress Notes (Deleted)
Cardiology Office Note    Date:  12/18/2015   ID:  Karina Andrews, DOB 1930/08/27, MRN [id number]  PCP:  Oliver Barre, MD  Cardiologist:  Dr. Mayford Knife   CC: discuss hospice?  History of Present Illness:  Karina Andrews is a 82 y.o. female nursing home patient with a history of COPD, continued tobacco abuse, severe AS, HTN, ETOH abuse and dysarthria who presents to clinic with daughter to discuss hospice.   She was last seen by Dr. Mayford Knife in 04/2015. 2D echo at that time showed normal LVF with EF 60-65^, grade 1 diastolic dysfunction and severe AS with mean AVG and AVA calculated at 0.78cm2. She had worsening DOE. Dr. Mayford Knife was concerned with her difficulty with speech she has been having which according to her daughter had become progressive. Dr. Mayford Knife wanted her referred to neuro before working her up for TAVR. MRI 05/23/15 showed atrophy and chronic small vessel ischemic changes throughout the brain. No acute or reversible finding. I do not see that she was ever seen by neuro.  She was admitted to [facility] in 05/2015 for a COPD exacerbation and right rib/scapular fractures after a fall. During this admission she underwent ETOH withdrawal which began to peak on 4/7. She fell out of bed on night just minutes after the nurse had settled her in and sustained C1 &  R maxillary fractures, R supraorbital laceration and R intraocular bleed. Neuro did see her on this admission and Dr Dohmeier felt speech issues were related to breathing issues from AV stenosis.   Per review of phone note, her daughter called to see if she could get an earlier appt due to her mother's symptoms increasing such as speech issues and possible hospice referral.      Past Medical History:  Diagnosis Date  . Alcohol abuse   . Aortic stenosis    moderate on echo 09/2013  . Cervical spondylosis with myelopathy 01/2009   s/p decompression  . COPD (chronic obstructive pulmonary disease) (HCC)   . Dementia 09/06/2015   . Depression   . Hypertension   . Osteoporosis    (R) hip fx 11/2009 & (L) hip fx 2006    Past Surgical History:  Procedure Laterality Date  . Bladder Tuck    . CATARACT EXTRACTION Right 06/06/13  . CHOLECYSTECTOMY  1995  . POSTERIOR LAMINECTOMY / DECOMPRESSION CERVICAL SPINE  01/2009   Done by Dr. Danielle Dess  . right hip  11/2009   ORIF/ Done by Dr. Turner Daniels    Current Medications: Outpatient Medications Prior to Visit  Medication Sig Dispense Refill  . albuterol (VENTOLIN HFA) 108 (90 Base) MCG/ACT inhaler Inhale 2 puffs into the lungs every 6 (six) hours as needed for wheezing or shortness of breath. 18 g 11  . aspirin EC 81 MG tablet Take 1 tablet (81 mg total) by mouth daily. 90 tablet 11  . budesonide-formoterol (SYMBICORT) 160-4.5 MCG/ACT inhaler Inhale 2 puffs into the lungs 2 (two) times daily. 1 Inhaler 0  . hydrOXYzine (ATARAX/VISTARIL) 50 MG tablet 1/2 tablet by mouth three times daily as needed for anxiety 90 tablet 3  . ipratropium-albuterol (DUONEB) 0.5-2.5 (3) MG/3ML SOLN Take 3 mLs by nebulization every 6 (six) hours as needed (if inhaler does not resolve wheezing). 360 mL 0  . losartan (COZAAR) 50 MG tablet Take 1 tablet (50 mg total) by mouth daily. 90 tablet 3  . rosuvastatin (CRESTOR) 10 MG tablet Take 1 tablet (10 mg total) by mouth daily.  90 tablet 3  . traZODone (DESYREL) 50 MG tablet Take 0.5-1 tablets (25-50 mg total) by mouth at bedtime as needed for sleep. 30 tablet 3   No facility-administered medications prior to visit.      Allergies:   Ativan [lorazepam]   Social History   Social History  . Marital status: Single    Spouse name: N/A  . Number of children: N/A  . Years of education: N/A   Social History Main Topics  . Smoking status: Former Smoker    Packs/day: 0.50    Years: 53.00    Types: Cigarettes    Quit date: 09/10/2015  . Smokeless tobacco: Never Used  . Alcohol use 0.0 oz/week  . Drug use: No  . Sexual activity: Not on file   Other  Topics Concern  . Not on file   Social History Narrative  . No narrative on file     Family History:  The patient's ***family history includes Heart attack in her father.     ROS:   Please see the history of present illness.    ROS All other systems reviewed and are negative.   PHYSICAL EXAM:   VS:  There were no vitals taken for this visit.   GEN: Well nourished, well developed, in no acute distress  HEENT: normal  Neck: no JVD, carotid bruits, or masses Cardiac: ***RRR; no murmurs, rubs, or gallops,no edema  Respiratory:  clear to auscultation bilaterally, normal work of breathing GI: soft, nontender, nondistended, + BS MS: no deformity or atrophy  Skin: warm and dry, no rash Neuro:  Alert and Oriented x 3, Strength and sensation are intact Psych: euthymic mood, full affect  Wt Readings from Last 3 Encounters:  11/09/15 130 lb (59 kg)  08/22/15 134 lb (60.8 kg)  08/03/15 124 lb (56.2 kg)      Studies/Labs Reviewed:   EKG:  EKG is*** ordered today.  The ekg ordered today demonstrates ***  Recent Labs: 04/11/2015: Pro B Natriuretic peptide (BNP) 33.0; TSH 1.92 05/31/2015: ALT 23 06/11/2015: BUN 19; Creatinine 0.7; Hemoglobin 11.2; Platelets 223; Potassium 4.3; Sodium 144   Lipid Panel    Component Value Date/Time   CHOL 260 (H) 04/11/2015 1731   TRIG 98.0 04/11/2015 1731   HDL 92.50 04/11/2015 1731   CHOLHDL 3 04/11/2015 1731   VLDL 19.6 04/11/2015 1731   LDLCALC 148 (H) 04/11/2015 1731   LDLDIRECT 189.2 12/12/2008 1025    Additional studies/ records that were reviewed today include:  2D ECHO: 04/26/2015 LV EF: 60% -   65% Study Conclusions - Left ventricle: The cavity size was normal. Wall thickness was   normal. Systolic function was normal. The estimated ejection   fraction was in the range of 60% to 65%. Wall motion was normal;   there were no regional wall motion abnormalities. Doppler   parameters are consistent with abnormal left ventricular    relaxation (grade 1 diastolic dysfunction). Doppler parameters   are consistent with high ventricular filling pressure. - Aortic valve: There was severe stenosis. There was trivial   regurgitation. Mean gradient (S): 44 mm Hg. Peak gradient (S): 71   mm Hg. Valve area (VTI): 0.75 cm^2. Valve area (Vmax): 0.78 cm^2.   Valve area (Vmean): 0.75 cm^2. - Mitral valve: Calcified annulus.   ASSESSMENT & PLAN:   TYFFANY WALDROP is a 82 y.o. female nursing home patient with a history of COPD, continued tobacco abuse, severe AS, HTN, ETOH abuse and dysarthria who presents  to clinic with daughter to discuss hospice.   Severe AS: likely not a TAVR candidate.   HTN: BP well controlled on current reigmen   COPD: still smoking. Followed by Dr. Sherene SiresWert  EToh abuse: drinking ***  Medication Adjustments/Labs and Tests Ordered: Current medicines are reviewed at length with the patient today.  Concerns regarding medicines are outlined above.  Medication changes, Labs and Tests ordered today are listed in the Patient Instructions below. There are no Patient Instructions on file for this visit.   Signed, Cline CrockKathryn Adolfo Granieri, PA-C  12/18/2015 7:50 AM    [facility] [address], [place], [place]  [address] Phone: [phone]; Fax: [phone]

## 2015-12-19 ENCOUNTER — Ambulatory Visit: Payer: Commercial Managed Care - HMO | Admitting: Physician Assistant

## 2015-12-26 DIAGNOSIS — Z9181 History of falling: Secondary | ICD-10-CM | POA: Diagnosis not present

## 2015-12-26 DIAGNOSIS — R0602 Shortness of breath: Secondary | ICD-10-CM | POA: Diagnosis not present

## 2015-12-26 DIAGNOSIS — J449 Chronic obstructive pulmonary disease, unspecified: Secondary | ICD-10-CM | POA: Diagnosis not present

## 2015-12-26 DIAGNOSIS — J441 Chronic obstructive pulmonary disease with (acute) exacerbation: Secondary | ICD-10-CM | POA: Diagnosis not present

## 2015-12-26 DIAGNOSIS — M6281 Muscle weakness (generalized): Secondary | ICD-10-CM | POA: Diagnosis not present

## 2015-12-27 ENCOUNTER — Telehealth: Payer: Self-pay | Admitting: Cardiology

## 2015-12-27 NOTE — Telephone Encounter (Signed)
SPOKE WITH  DAUGHTER,  RE: PT ,  AT THIS TIME , PT  IS  OKAY  LAST  NIGHT  HAD  EPISODE  OF  RIGHT  SHOULDER PAIN  WAS  GIVEN BREATHING TREATMENT  WITH  RELIEF  FEELS  MAY HAVE HAD   PANIC  ATTACK, AS WELL .  ALSO  O 2   IS LOW  PER PT  AND PT'S DAUGHTER  the patient   IS  CURENTLY  AT  FRIENDS  PLACE DAUGHTER  IS UNCERTAIN  ON WHAT  PT  NEEDS   IS  AWARE  HAS  SEVERE  AS   BUT  PT  DOES NOT  WANT  SURGERY . WILL FORWARD  TO  DR  TURNER  FOR REVIEW .

## 2015-12-27 NOTE — Telephone Encounter (Signed)
New message      Pt c/o of Chest Pain: STAT if CP now or developed within 24 hours  1. Are you having CP right now?  no 2. Are you experiencing any other symptoms (ex. SOB, nausea, vomiting, sweating)? Rt shoulder hurt 3. How long have you been experiencing CP?  Started yesterday at 12:30am----only this 1 episode 4. Is your CP continuous or coming and going? daughter says it was continuous 5. Have you taken Nitroglycerin?  Daughter not sure [facility]  Daughter want to know if we can see pt today.  She will call pt and get updated info so that when nurse calls back she will have it

## 2015-12-28 NOTE — Telephone Encounter (Signed)
LM TO CALL BACK ./CY 

## 2015-12-28 NOTE — Telephone Encounter (Signed)
Needs to see PA in office today or go to ER

## 2015-12-31 ENCOUNTER — Encounter: Payer: Self-pay | Admitting: Nurse Practitioner

## 2015-12-31 NOTE — Telephone Encounter (Signed)
Follow up ° ° ° ° ° °Returning a call to the nurse °

## 2015-12-31 NOTE — Telephone Encounter (Signed)
Spoke with patient's daughter, who reports the patient is stable from last week (see Christine's phone note below) but her symptoms make her nervous so she would like her to be seen sooner than scheduled OV with Dr. Mayford Knifeurner.  She has not seen her mother today so she is unsure of symptoms now. The patient's daughter states she thinks her mom's symptoms are probably "nerves related," but wants her evaluated just to make sure nothing cardiac is wrong.  Scheduled patient tomorrow with Norma FredricksonLori Gerhardt for evaluation. She understands to seek emergency medical attention of her mom's symptoms worsen in the meantime.

## 2016-01-01 ENCOUNTER — Encounter (INDEPENDENT_AMBULATORY_CARE_PROVIDER_SITE_OTHER): Payer: Self-pay

## 2016-01-01 ENCOUNTER — Ambulatory Visit (INDEPENDENT_AMBULATORY_CARE_PROVIDER_SITE_OTHER): Payer: Commercial Managed Care - HMO | Admitting: Nurse Practitioner

## 2016-01-01 ENCOUNTER — Encounter: Payer: Self-pay | Admitting: Nurse Practitioner

## 2016-01-01 VITALS — BP 122/60 | HR 94 | Wt 134.0 lb

## 2016-01-01 DIAGNOSIS — I350 Nonrheumatic aortic (valve) stenosis: Secondary | ICD-10-CM

## 2016-01-01 DIAGNOSIS — R0600 Dyspnea, unspecified: Secondary | ICD-10-CM

## 2016-01-01 DIAGNOSIS — I10 Essential (primary) hypertension: Secondary | ICD-10-CM | POA: Diagnosis not present

## 2016-01-01 DIAGNOSIS — Z79899 Other long term (current) drug therapy: Secondary | ICD-10-CM | POA: Diagnosis not present

## 2016-01-01 DIAGNOSIS — R0602 Shortness of breath: Secondary | ICD-10-CM | POA: Diagnosis not present

## 2016-01-01 MED ORDER — FUROSEMIDE 20 MG PO TABS: 20.0000 mg | ORAL_TABLET | Freq: Every day | ORAL | 3 refills | Status: DC

## 2016-01-01 NOTE — Progress Notes (Signed)
CARDIOLOGY OFFICE NOTE  Date:  01/01/2016    Karina Andrews Date of Birth: 1930-11-21 Medical Record #[id number]  PCP:  Oliver BarreJames John, MD  Cardiologist:  Mayford Knifeurner  Chief Complaint  Patient presents with  . Shortness of Breath    Work in visit - seen for Dr. Mayford Knifeurner    History of Present Illness: Karina Andrews is a 82 y.o. female who presents today for a work in visit. Seen for Dr. Mayford Knifeurner.   She has a history of AS, COPD, & HTN. She has a long tobacco history.    Last seen back in March - noted progressive AS on echo. Looks as if she was referred on to Dr. Excell Seltzerooper for TAVR evaluation but fell and had a C1 fracture. Appointment never rescheduled.   Has cancelled several appointments recently but apparently changed by our office.   Phone call earlier this week - "Spoke with patient's daughter, who reports the patient is stable from last week (see Christine's phone note below) but her symptoms make her nervous so she would like her to be seen sooner than scheduled OV with Dr. Mayford Knifeurner.  She has not seen her mother today so she is unsure of symptoms now. The patient's daughter states she thinks her mom's symptoms are probably "nerves related," but wants her evaluated just to make sure nothing cardiac is wrong.  Scheduled patient tomorrow with Norma FredricksonLori Zelena Bushong for evaluation. She understands to seek emergency medical attention of her mom's symptoms worsen in the meantime" - thus added to my schedule for today.   Comes in today. Here with her daughter. In a wheelchair. On oxygen. Very hard to understand with her speech impediment - her speech is like a high pitched stridor - her daughter says it has been present for over 9 months - apparently no one has really been able to discern an etiology. Some chest pain the other night - pretty vague about this. No syncope. Very short of breath. Using her oxygen more but does not sound like she uses it all the time. Very limited with her activity. Has  now began to consider TAVR work up. Daughter notes that the neurologist cleared her to proceed but she has otherwise not had any of her work up. She gets quite anxious - especially at night - mostly due to shortness of breath. Daughter is asking about anxiety medicine.   Past Medical History:  Diagnosis Date  . Alcohol abuse   . Aortic stenosis    moderate on echo 09/2013  . Cervical spondylosis with myelopathy 01/2009   s/p decompression  . COPD (chronic obstructive pulmonary disease) (HCC)   . Dementia 09/06/2015  . Depression   . Hypertension   . Osteoporosis    (R) hip fx 11/2009 & (L) hip fx 2006    Past Surgical History:  Procedure Laterality Date  . Bladder Tuck    . CATARACT EXTRACTION Right 06/06/13  . CHOLECYSTECTOMY  1995  . POSTERIOR LAMINECTOMY / DECOMPRESSION CERVICAL SPINE  01/2009   Done by Dr. Danielle DessElsner  . right hip  11/2009   ORIF/ Done by Dr. Turner Danielsowan     Medications: Current Outpatient Prescriptions  Medication Sig Dispense Refill  . acetaminophen (TYLENOL) 500 MG tablet Take 500 mg by mouth every 8 (eight) hours as needed.    Marland Kitchen. albuterol (VENTOLIN HFA) 108 (90 Base) MCG/ACT inhaler Inhale 2 puffs into the lungs every 6 (six) hours as needed for wheezing or shortness of breath. 18  g 11  . aspirin EC 81 MG tablet Take 1 tablet (81 mg total) by mouth daily. 90 tablet 11  . budesonide-formoterol (SYMBICORT) 160-4.5 MCG/ACT inhaler Inhale 2 puffs into the lungs 2 (two) times daily. 1 Inhaler 0  . hydrOXYzine (ATARAX/VISTARIL) 50 MG tablet 1/2 tablet by mouth three times daily as needed for anxiety 90 tablet 3  . ipratropium-albuterol (DUONEB) 0.5-2.5 (3) MG/3ML SOLN Take 3 mLs by nebulization every 6 (six) hours as needed (if inhaler does not resolve wheezing). 360 mL 0  . losartan (COZAAR) 50 MG tablet Take 1 tablet (50 mg total) by mouth daily. 90 tablet 3  . nicotine (NICODERM CQ - DOSED IN MG/24 HOURS) 21 mg/24hr patch Place 21 mg onto the skin daily.    .  rosuvastatin (CRESTOR) 10 MG tablet Take 1 tablet (10 mg total) by mouth daily. 90 tablet 3  . traZODone (DESYREL) 50 MG tablet Take 1 tablet (50 mg total) by mouth at bedtime as needed for sleep. 30 tablet 5  . furosemide (LASIX) 20 MG tablet Take 1 tablet (20 mg total) by mouth daily. 90 tablet 3   No current facility-administered medications for this visit.     Allergies: Allergies  Allergen Reactions  . Ativan [Lorazepam] Other (See Comments)    Causes increased confusion/AMS    Social History: The patient  reports that she quit smoking about 3 months ago. Her smoking use included Cigarettes. She has a 26.50 pack-year smoking history. She has never used smokeless tobacco. She reports that she drinks alcohol. She reports that she does not use drugs.   Family History: The patient's family history includes Heart attack in her father.   Review of Systems: Please see the history of present illness.   Otherwise, the review of systems is positive for none.   All other systems are reviewed and negative.   Physical Exam: VS:  BP 122/60   Pulse 94   Wt 134 lb (60.8 kg)   SpO2 95% Comment: ON 2L  BMI 23.74 kg/m  .  BMI Body mass index is 23.74 kg/m.  Wt Readings from Last 3 Encounters:  01/01/16 134 lb (60.8 kg)  11/09/15 130 lb (59 kg)  08/22/15 134 lb (60.8 kg)    General: Elderly female. Looks chronically ill, older than her stated age, but alert and in no acute distress.   HEENT: Normal. But her eyes look jaundiced.  Neck: Supple, no JVD, carotid bruits, or masses noted.  Cardiac: Regular rate and rhythm but elevated. Harsh outflow murmur.  No significant edema.  Respiratory:  Lungs with diffuse crackles/ronchi and with increased work of breathing. Oxygen is in place. Her oxygen level is 95%.  GI: Soft and nontender.  MS: No deformity or atrophy. Gait not tested. Skin: Warm and dry. Color is normal.  Neuro:  Strength and sensation are intact and no gross focal deficits  noted.  Psych: Alert, appropriate and with normal affect.   LABORATORY DATA:  EKG:  EKG is ordered today. This shows NSR/sinus arrhythmia. Rate is down to 94.   Lab Results  Component Value Date   WBC 8.5 06/11/2015   HGB 11.2 (A) 06/11/2015   HCT 35 (A) 06/11/2015   PLT 223 06/11/2015   GLUCOSE 89 06/04/2015   CHOL 260 (H) 04/11/2015   TRIG 98.0 04/11/2015   HDL 92.50 04/11/2015   LDLDIRECT 189.2 12/12/2008   LDLCALC 148 (H) 04/11/2015   ALT 23 05/31/2015   AST 23 05/31/2015  NA 144 06/11/2015   K 4.3 06/11/2015   CL 111 06/04/2015   CREATININE 0.7 06/11/2015   BUN 19 06/11/2015   CO2 26 06/04/2015   TSH 1.92 04/11/2015   INR 1.02 05/31/2015    BNP (last 3 results) No results for input(s): BNP in the last 8760 hours.  ProBNP (last 3 results)  Recent Labs  04/11/15 1731  PROBNP 33.0     Other Studies Reviewed Today:  Echo Study Conclusions March 2017  - Left ventricle: The cavity size was normal. Wall thickness was   normal. Systolic function was normal. The estimated ejection   fraction was in the range of 60% to 65%. Wall motion was normal;   there were no regional wall motion abnormalities. Doppler   parameters are consistent with abnormal left ventricular   relaxation (grade 1 diastolic dysfunction). Doppler parameters   are consistent with high ventricular filling pressure. - Aortic valve: There was severe stenosis. There was trivial   regurgitation. Mean gradient (S): 44 mm Hg. Peak gradient (S): 71   mm Hg. Valve area (VTI): 0.75 cm^2. Valve area (Vmax): 0.78 cm^2.   Valve area (Vmean): 0.75 cm^2. - Mitral valve: Calcified annulus.   Assessment/Plan: 1. Shortness of breath - most likely multifactorial - she has severe COPD and severe AS. Has oxygen in place.   2. Severe AS - she is now wanting to consider TAVR work up. May be too late and I explained that this may not fix all of her breathing issues. We also talked about Hospice consult. She  is asking for "a shot to end it all". She is going to consider her options. Will try to diurese her gently with low dose Lasix. Check lab today. We have her an appointment to see Dr. Clifton James later this month. Have explained that the TAVR work up will be quite involved and that she may not be a candidate. She verbalizes understanding. Further disposition to follow.   3. Severe COPD - on oxygen - not smoking  4. Voice abnormality - I wonder if she has something wrong with her vocal cords. Her voice is probably aggravated by the shortness of breath which is aggravated by the COPD and AS.   Current medicines are reviewed with the patient today.  The patient does not have concerns regarding medicines other than what has been noted above.  The following changes have been made:  See above.  Labs/ tests ordered today include:    Orders Placed This Encounter  Procedures  . Brain natriuretic peptide  . Basic metabolic panel  . CBC  . Hepatic function panel  . TSH  . EKG 12-Lead     Disposition:   FU with Dr. Clifton James for TAVR consult. May consider Hospice.   Patient is agreeable to this plan and will call if any problems develop in the interim.   Signed: Rosalio Macadamia, RN, ANP-C 01/01/2016 3:03 PM  [facility] [address] [place], [place]  [address] Phone: [phone] Fax: [phone]

## 2016-01-01 NOTE — Patient Instructions (Addendum)
We will be checking the following labs today - BMET, BNP, CBC, HPF   Medication Instructions:    Continue with your current medicines. BUT  I am adding Lasix 20 mg to take one daily    Testing/Procedures To Be Arranged:  N/A  Follow-Up:   See Dr. Clifton JamesMcAlhany later this month for TAVR consultation    Other Special Instructions:   Think about what we talked about today.     If you need a refill on your cardiac medications before your next appointment, please call your pharmacy.   Call the [facility] office at [phone] if you have any questions, problems or concerns.

## 2016-01-02 ENCOUNTER — Telehealth: Payer: Self-pay | Admitting: Pediatrics

## 2016-01-02 ENCOUNTER — Encounter (HOSPITAL_COMMUNITY): Payer: Self-pay | Admitting: Emergency Medicine

## 2016-01-02 ENCOUNTER — Observation Stay (HOSPITAL_COMMUNITY)
Admission: EM | Admit: 2016-01-02 | Discharge: 2016-01-03 | Disposition: A | Discharge status: Home / Self Care | Payer: Commercial Managed Care - HMO | Attending: Internal Medicine | Admitting: Internal Medicine

## 2016-01-02 DIAGNOSIS — D649 Anemia, unspecified: Principal | ICD-10-CM | POA: Insufficient documentation

## 2016-01-02 DIAGNOSIS — Z7982 Long term (current) use of aspirin: Secondary | ICD-10-CM | POA: Insufficient documentation

## 2016-01-02 DIAGNOSIS — Z87891 Personal history of nicotine dependence: Secondary | ICD-10-CM | POA: Diagnosis not present

## 2016-01-02 DIAGNOSIS — J449 Chronic obstructive pulmonary disease, unspecified: Secondary | ICD-10-CM | POA: Insufficient documentation

## 2016-01-02 DIAGNOSIS — R799 Abnormal finding of blood chemistry, unspecified: Secondary | ICD-10-CM | POA: Diagnosis not present

## 2016-01-02 DIAGNOSIS — Z79899 Other long term (current) drug therapy: Secondary | ICD-10-CM | POA: Diagnosis not present

## 2016-01-02 DIAGNOSIS — F17210 Nicotine dependence, cigarettes, uncomplicated: Secondary | ICD-10-CM | POA: Diagnosis present

## 2016-01-02 DIAGNOSIS — I10 Essential (primary) hypertension: Secondary | ICD-10-CM | POA: Diagnosis not present

## 2016-01-02 DIAGNOSIS — Z9289 Personal history of other medical treatment: Secondary | ICD-10-CM

## 2016-01-02 DIAGNOSIS — I350 Nonrheumatic aortic (valve) stenosis: Secondary | ICD-10-CM | POA: Diagnosis not present

## 2016-01-02 DIAGNOSIS — D509 Iron deficiency anemia, unspecified: Secondary | ICD-10-CM | POA: Diagnosis present

## 2016-01-02 HISTORY — DX: Unspecified speech disturbances: R47.9

## 2016-01-02 HISTORY — DX: Anemia, unspecified: D64.9

## 2016-01-02 HISTORY — DX: Personal history of other medical treatment: Z92.89

## 2016-01-02 HISTORY — DX: Unspecified fall, initial encounter: W19.XXXA

## 2016-01-02 HISTORY — DX: Dependence on supplemental oxygen: Z99.81

## 2016-01-02 HISTORY — DX: Cardiac murmur, unspecified: R01.1

## 2016-01-02 HISTORY — DX: Anxiety disorder, unspecified: F41.9

## 2016-01-02 HISTORY — DX: Unspecified place in unspecified non-institutional (private) residence as the place of occurrence of the external cause: Y92.009

## 2016-01-02 LAB — HEPATIC FUNCTION PANEL
ALT: 11 U/L (ref 6–29)
AST: 16 U/L (ref 10–35)
Albumin: 4 g/dL (ref 3.6–5.1)
Alkaline Phosphatase: 88 U/L (ref 33–130)
Bilirubin, Direct: 0 mg/dL (ref <=0.2)
Indirect Bilirubin: 0.2 mg/dL (ref 0.2–1.2)
Total Bilirubin: 0.2 mg/dL (ref 0.2–1.2)
Total Protein: 7 g/dL (ref 6.1–8.1)

## 2016-01-02 LAB — BASIC METABOLIC PANEL
Anion gap: 7 (ref 5–15)
BUN: 21 mg/dL (ref 7–25)
BUN: 15 mg/dL (ref 6–20)
CHLORIDE: 108 mmol/L (ref 101–111)
CO2: 24 mmol/L (ref 20–31)
CO2: 26 mmol/L (ref 22–32)
CREATININE: 0.63 mg/dL (ref 0.44–1.00)
Calcium: 9.1 mg/dL (ref 8.6–10.4)
Calcium: 9.1 mg/dL (ref 8.9–10.3)
Chloride: 107 mmol/L (ref 98–110)
Creat: 0.67 mg/dL (ref 0.60–0.88)
GFR calc non Af Amer: >60 mL/min (ref >60)
Glucose, Bld: 117 mg/dL — ABNORMAL HIGH (ref 65–99)
Glucose, Bld: 106 mg/dL — ABNORMAL HIGH (ref 65–99)
Potassium: 4.7 mmol/L (ref 3.5–5.3)
Potassium: 3.8 mmol/L (ref 3.5–5.1)
Sodium: 142 mmol/L (ref 135–146)
Sodium: 141 mmol/L (ref 135–145)

## 2016-01-02 LAB — CBC
HCT: 23.8 % — ABNORMAL LOW (ref 35.0–45.0)
HEMATOCRIT: 22.7 % — AB (ref 36.0–46.0)
Hemoglobin: 6.8 g/dL — ABNORMAL LOW (ref 11.7–15.5)
Hemoglobin: 6.8 g/dL — CL (ref 12.0–15.0)
MCH: 20.3 pg — ABNORMAL LOW (ref 27.0–33.0)
MCH: 19.9 pg — ABNORMAL LOW (ref 26.0–34.0)
MCHC: 28.6 g/dL — ABNORMAL LOW (ref 32.0–36.0)
MCHC: 29.1 g/dL — AB (ref 30.0–36.0)
MCV: 71 fL — ABNORMAL LOW (ref 80.0–100.0)
MCV: 68.4 fL — ABNORMAL LOW (ref 78.0–100.0)
Platelets: 218 10*3/uL (ref 140–400)
Platelets: 174 10*3/uL (ref 150–400)
RBC: 3.35 MIL/uL — ABNORMAL LOW (ref 3.80–5.10)
RBC: 3.32 MIL/uL — AB (ref 3.87–5.11)
RDW: 16 % — ABNORMAL HIGH (ref 11.0–15.0)
RDW: 16.1 % — ABNORMAL HIGH (ref 11.5–15.5)
WBC: 7.4 10*3/uL (ref 3.8–10.8)
WBC: 5.4 10*3/uL (ref 4.0–10.5)

## 2016-01-02 LAB — PREPARE RBC (CROSSMATCH)

## 2016-01-02 LAB — POC OCCULT BLOOD, ED: FECAL OCCULT BLD: NEGATIVE

## 2016-01-02 LAB — OCCULT BLOOD X 1 CARD TO LAB, STOOL: Fecal Occult Bld: NEGATIVE

## 2016-01-02 LAB — RETICULOCYTES
RBC.: 3.35 MIL/uL — AB (ref 3.87–5.11)
Retic Count, Absolute: 46.9 10*3/uL (ref 19.0–186.0)
Retic Ct Pct: 1.4 % (ref 0.4–3.1)

## 2016-01-02 LAB — IRON AND TIBC
IRON: 9 ug/dL — AB (ref 28–170)
Saturation Ratios: 2 % — ABNORMAL LOW (ref 10.4–31.8)
TIBC: 498 ug/dL — AB (ref 250–450)
UIBC: 489 ug/dL

## 2016-01-02 LAB — ABO/RH: ABO/RH(D): A POS

## 2016-01-02 LAB — TSH: TSH: 2.01 mIU/L

## 2016-01-02 LAB — BRAIN NATRIURETIC PEPTIDE: Brain Natriuretic Peptide: 18.2 pg/mL (ref <100)

## 2016-01-02 LAB — FERRITIN: Ferritin: 3 ng/mL — ABNORMAL LOW (ref 11–307)

## 2016-01-02 MED ORDER — IPRATROPIUM-ALBUTEROL 0.5-2.5 (3) MG/3ML IN SOLN: 3.0000 mL | Freq: Four times a day (QID) | RESPIRATORY_TRACT | Status: DC | PRN

## 2016-01-02 MED ORDER — LOSARTAN POTASSIUM 50 MG PO TABS
50.0000 mg | ORAL_TABLET | Freq: Every day | ORAL | Status: DC
  Administered 2016-01-03: 50 mg via ORAL
  Filled 2016-01-02: qty 1

## 2016-01-02 MED ORDER — MOMETASONE FURO-FORMOTEROL FUM 200-5 MCG/ACT IN AERO
2.0000 | INHALATION_SPRAY | Freq: Two times a day (BID) | RESPIRATORY_TRACT | Status: DC
  Administered 2016-01-02 – 2016-01-03 (×2): 2 via RESPIRATORY_TRACT
  Filled 2016-01-02: qty 8.8

## 2016-01-02 MED ORDER — ACETAMINOPHEN 325 MG PO TABS
650.0000 mg | ORAL_TABLET | Freq: Three times a day (TID) | ORAL | Status: DC | PRN
  Administered 2016-01-02 – 2016-01-03 (×2): 650 mg via ORAL
  Filled 2016-01-02 (×2): qty 2

## 2016-01-02 MED ORDER — TRAZODONE HCL 50 MG PO TABS: 50.0000 mg | ORAL_TABLET | Freq: Every evening | ORAL | Status: DC | PRN

## 2016-01-02 MED ORDER — NICOTINE 21 MG/24HR TD PT24
21.0000 mg | MEDICATED_PATCH | Freq: Every day | TRANSDERMAL | Status: DC
  Administered 2016-01-03: 21 mg via TRANSDERMAL
  Filled 2016-01-02: qty 1

## 2016-01-02 MED ORDER — ASPIRIN EC 81 MG PO TBEC: 81.0000 mg | DELAYED_RELEASE_TABLET | Freq: Every day | ORAL | Status: DC

## 2016-01-02 MED ORDER — SODIUM CHLORIDE 0.9 % IV SOLN
Freq: Once | INTRAVENOUS | Status: AC
  Administered 2016-01-02: 13:00:00 via INTRAVENOUS

## 2016-01-02 MED ORDER — HYDROXYZINE HCL 25 MG PO TABS
25.0000 mg | ORAL_TABLET | Freq: Three times a day (TID) | ORAL | Status: DC | PRN
  Administered 2016-01-02 – 2016-01-03 (×3): 25 mg via ORAL
  Filled 2016-01-02 (×3): qty 1

## 2016-01-02 MED ORDER — FUROSEMIDE 10 MG/ML IJ SOLN
20.0000 mg | Freq: Once | INTRAMUSCULAR | Status: AC
  Administered 2016-01-02: 20 mg via INTRAVENOUS
  Filled 2016-01-02: qty 2

## 2016-01-02 MED ORDER — FUROSEMIDE 20 MG PO TABS
20.0000 mg | ORAL_TABLET | Freq: Every day | ORAL | Status: DC
  Administered 2016-01-03: 20 mg via ORAL
  Filled 2016-01-02: qty 1

## 2016-01-02 MED ORDER — ROSUVASTATIN CALCIUM 10 MG PO TABS
10.0000 mg | ORAL_TABLET | Freq: Every day | ORAL | Status: DC
  Administered 2016-01-03: 10 mg via ORAL
  Filled 2016-01-02: qty 1

## 2016-01-02 MED ORDER — ALBUTEROL SULFATE (2.5 MG/3ML) 0.083% IN NEBU: 3.0000 mL | INHALATION_SOLUTION | Freq: Four times a day (QID) | RESPIRATORY_TRACT | Status: DC | PRN

## 2016-01-02 NOTE — ED Notes (Signed)
Admitting team advises anemia panel prior to blood work.

## 2016-01-02 NOTE — ED Triage Notes (Signed)
Pt to ER BIB [facility] from [facility]. Pt here for further evaluation of Hgb 6.8. Went to physicians office yesterday for blood work was told to come to ER for possible transfusion. Hemodynamically stable 130/68, HR 80, SpO2 94% on RA. Pt is a/o x4, hx of severe aortic stenosis.

## 2016-01-02 NOTE — H&P (Signed)
[facility] History and Physical  Karina MoGertrud L Bilbo ZOX:[id number] DOB: [birth year] DOA: 01/02/2016  Referring physician:  PCP: Oliver BarreJames John, MD  Specialists:   Chief Complaint: low Hg  HPI: Karina Andrews is a 82 y.o. female with PMH of HTN, COPD on home oxygen, Severe Aortic Stenosis presented from ALF with abnormal labs. Patient was seen at cardiology office yesterday for ongoing evaluation for aortic stenosis. She was called today to come to emergency room because her Hg is 6.8. Patient denies acute signs or symptoms of acute bleeding. Denies abdominal pains, no hematemesis, no hematochezia, no diarrhea, no bright red blood per rectum. Although she reports one episode of black stool few days ago, which is resolved. Rectal exam, occult blood test in ED was negative. She reports mild SOB for few days, no acute chest pains, no focal weakness. No blood in urine.  -ED: repeat Hg is still 6.8. Patient is started on 2 units of blood TYF. hospitalist is called for overnight observation.    Review of Systems: The patient denies anorexia, fever, weight loss,, vision loss, decreased hearing, hoarseness, chest pain, syncope, dyspnea on exertion, peripheral edema, balance deficits, hemoptysis, abdominal pain, melena, hematochezia, severe indigestion/heartburn, hematuria, incontinence, genital sores, muscle weakness, suspicious skin lesions, transient blindness, difficulty walking, depression, unusual weight change, abnormal bleeding, enlarged lymph nodes, angioedema, and breast masses.    Past Medical History:  Diagnosis Date  . Alcohol abuse   . Aortic stenosis    moderate on echo 09/2013  . Cervical spondylosis with myelopathy 01/2009   s/p decompression  . COPD (chronic obstructive pulmonary disease) (HCC)   . Dementia 09/06/2015  . Depression   . Hypertension   . Osteoporosis    (R) hip fx 11/2009 & (L) hip fx 2006   Past Surgical History:  Procedure Laterality Date  . Bladder Tuck     . CATARACT EXTRACTION Right 06/06/13  . CHOLECYSTECTOMY  1995  . POSTERIOR LAMINECTOMY / DECOMPRESSION CERVICAL SPINE  01/2009   Done by Dr. Danielle DessElsner  . right hip  11/2009   ORIF/ Done by Dr. Turner Danielsowan   Social History:  reports that she quit smoking about 3 months ago. Her smoking use included Cigarettes. She has a 26.50 pack-year smoking history. She has never used smokeless tobacco. She reports that she drinks alcohol. She reports that she does not use drugs. ALF:  where does patient live--home, ALF, SNF? and with whom if at home? Yes;  Can patient participate in ADLs?  Allergies  Allergen Reactions  . Ativan [Lorazepam] Other (See Comments)    Causes increased confusion/AMS    Family History  Problem Relation Age of Onset  . Heart attack Father     (be sure to complete)  Prior to Admission medications   Medication Sig Start Date End Date Taking? Authorizing Provider  acetaminophen (TYLENOL) 500 MG tablet Take 500 mg by mouth every 8 (eight) hours as needed.   Yes Historical Provider, MD  albuterol (VENTOLIN HFA) 108 (90 Base) MCG/ACT inhaler Inhale 2 puffs into the lungs every 6 (six) hours as needed for wheezing or shortness of breath. 08/22/15  Yes Corwin LevinsJames W John, MD  aspirin EC 81 MG tablet Take 1 tablet (81 mg total) by mouth daily. 08/22/15  Yes Corwin LevinsJames W John, MD  budesonide-formoterol [facility](SYMBICORT) 160-4.5 MCG/ACT inhaler Inhale 2 puffs into the lungs 2 (two) times daily. 11/14/15  Yes Nyoka CowdenMichael B Wert, MD  furosemide (LASIX) 20 MG tablet Take 1 tablet (20 mg total) by  mouth daily. 01/01/16 03/31/16 Yes Rosalio MacadamiaLori C Gerhardt, NP  hydrOXYzine (ATARAX/VISTARIL) 50 MG tablet 1/2 tablet by mouth three times daily as needed for anxiety Patient taking differently: Take 25 mg by mouth See admin instructions. Pt takes 25mg  once daily - also can take 25mg  three times daily as needed for anxiety/depression 08/22/15  Yes Corwin LevinsJames W John, MD  ipratropium-albuterol (DUONEB) 0.5-2.5 (3) MG/3ML SOLN Take 3 mLs by  nebulization every 6 (six) hours as needed (if inhaler does not resolve wheezing). 06/05/15  Yes Calvert CantorSaima Rizwan, MD  losartan (COZAAR) 50 MG tablet Take 1 tablet (50 mg total) by mouth daily. 08/22/15  Yes Corwin LevinsJames W John, MD  nicotine (NICODERM CQ - DOSED IN MG/24 HOURS) 21 mg/24hr patch Place 21 mg onto the skin daily.   Yes Historical Provider, MD  rosuvastatin (CRESTOR) 10 MG tablet Take 1 tablet (10 mg total) by mouth daily. 08/22/15  Yes Corwin LevinsJames W John, MD  traZODone (DESYREL) 50 MG tablet Take 1 tablet (50 mg total) by mouth at bedtime as needed for sleep. 12/18/15  Yes Corwin LevinsJames W John, MD   Physical Exam: Vitals:   01/02/16 1215 01/02/16 1221  BP: 111/65 111/65  Pulse: 91 89  Resp: 14 24  Temp:  97.8 F (36.6 C)     General:  Alert, no distress.   Eyes: eom-I, perrla   ENT: no oral ulcers   Neck: supple, no JVD  Cardiovascular: s1,s2 systolic ejection murmur. RRR   Respiratory: no wheezing   Abdomen: soft, nt, nd   Skin: no rash   Musculoskeletal: no leg edema   Psychiatric: no hallucinations   Neurologic: CN 2-12 intact. Speech is slow (not new). Motor 5/5 BL symmetric   Labs on Admission:  Basic Metabolic Panel:  Recent Labs Lab 01/01/16 1446 01/02/16 1011  NA 142 141  K 4.7 3.8  CL 107 108  CO2 24 26  GLUCOSE 117* 106*  BUN 21 15  CREATININE 0.67 0.63  CALCIUM 9.1 9.1   Liver Function Tests:  Recent Labs Lab 01/01/16 1446  AST 16  ALT 11  ALKPHOS 88  BILITOT 0.2  PROT 7.0  ALBUMIN 4.0   No results for input(s): LIPASE, AMYLASE in the last 168 hours. No results for input(s): AMMONIA in the last 168 hours. CBC:  Recent Labs Lab 01/01/16 1446 01/02/16 1011  WBC 7.4 5.4  HGB 6.8* 6.8*  HCT 23.8* 22.7*  MCV 71.0* 68.4*  PLT 218 174   Cardiac Enzymes: No results for input(s): CKTOTAL, CKMB, CKMBINDEX, TROPONINI in the last 168 hours.  BNP (last 3 results)  Recent Labs  01/01/16 1446  BNP 18.2    ProBNP (last 3 results)  Recent  Labs  04/11/15 1731  PROBNP 33.0    CBG: No results for input(s): GLUCAP in the last 168 hours.  Radiological Exams on Admission: No results found.  EKG: Independently reviewed.   Assessment/Plan Active Problems:   Essential hypertension   Aortic stenosis, severe   Cigarette smoker   COPD (chronic obstructive pulmonary disease) (HCC)   Symptomatic anemia  82 y.o. female with PMH of HTN, COPD on home oxygen, Severe Aortic Stenosis presented from ALF with abnormal labs. Found to have Hg at 6.8.   Symptomatic anemia. Hg 6.8.  Low mcv ? recent blood loss but no s/s of acute bleeding. Occult blood test is negative in ED. -we will obtain iron profile, retic count, recheck occult test. TF 2 units PRBC. Recheck Hg post TF. May need  outpatient GI work up with endoscopy   COPD on home oxygen. No s/s of exacerbation. H/o ling nodule (Nodule in the upper lobe measures 1.2 cm image, unchanged on last CT). D/w patient, her daughter to f/u with repeat CT at discharge   Severe Aortic Stenosis. Patient is being evaluated for possible TAVR by cardiology. Cont outpatient follow up .   PLEASE avoid opioids and benzodiazepine due to precious severe side effect  Patient is DNR. confirmed with her family  None;  if consultant consulted, please document name and whether formally or informally consulted  Code Status: DNR (must indicate code status--if unknown or must be presumed, indicate so) Family Communication: d/w patient, her children (indicate person spoken with, if applicable, with phone number if by telephone) Disposition Plan: ALF in 24-48 hrs  (indicate anticipated LOS)  Time spent: >45 minutes   Esperanza Sheets [facility] Pager [phone]  If 7PM-7AM, please contact night-coverage [email or web address] Password [email or web address] 01/02/2016, 12:42 PM

## 2016-01-02 NOTE — ED Notes (Signed)
O2 at 90% on room air, placed pt. On 2L brought O2 up to 95%. Will continue to monitor.

## 2016-01-02 NOTE — Telephone Encounter (Signed)
Done

## 2016-01-02 NOTE — Telephone Encounter (Signed)
[facility] being sent to ED this am.  She states pt's daughter called them and told them to send pt straight to ED due to Hgb 6.8, per Norma FredricksonLori Gerhardt, NP.  Karina Andrews states [facility], but she needs [facility] faxed to [phone].

## 2016-01-02 NOTE — Telephone Encounter (Signed)
Faxing to [facility] @ [phone] [facility] profound [facility].

## 2016-01-02 NOTE — ED Provider Notes (Signed)
[facility]-EMERGENCY DEPT Provider Note   CSN: [id number] Arrival date & time: 01/02/16  0919     History   Chief Complaint Chief Complaint  Patient presents with  . Anemia    HPI Karina Andrews is a 82 y.o. female.  Patient from [facility], with a history of AS, COPD, & HTN -- presents with increasing fatigue, weakness, shortness of breath over the past several weeks. Patient saw her cardiology nurse practitioner yesterday and had labs drawn. Her hemoglobin was 6.8 and she was sent to the emergency department for possible transfusion. Patient is dependent on oxygen at home. She denies any chest pain or abdominal pain. She denies easy bruising or bleeding. She denies blood noted in her urine or stool. The onset of this condition was acute. The course is worsening. Aggravating factors: activites. Alleviating factors: none.        Past Medical History:  Diagnosis Date  . Alcohol abuse   . Aortic stenosis    moderate on echo 09/2013  . Cervical spondylosis with myelopathy 01/2009   s/p decompression  . COPD (chronic obstructive pulmonary disease) (HCC)   . Dementia 09/06/2015  . Depression   . Hypertension   . Osteoporosis    (R) hip fx 11/2009 & (L) hip fx 2006    Patient Active Problem List   Diagnosis Date Noted  . Dementia 09/06/2015  . Gait disorder 08/22/2015  . COPD GOLD II  08/04/2015  . COPD (chronic obstructive pulmonary disease) (HCC) 07/01/2015  . Multiple fractures of ribs of right side 06/05/2015  . Encounter for palliative care   . Goals of care, counseling/discussion   . Closed C1 fracture (HCC) 06/02/2015  . Fracture of maxilla, closed (HCC) 06/02/2015  . Fall 05/31/2015  . Alcohol abuse 05/31/2015  . Dysarthria 05/17/2015  . Wellness examination 05/11/2015  . Indeterminate pulmonary nodules 05/11/2015  . COPD exacerbation (HCC) 05/05/2015  . Dyspnea 04/11/2015  . Cigarette smoker 04/11/2015  . Aortic stenosis, severe  06/15/2013  . Insomnia 06/15/2013  . Senile osteoporosis   . 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 Hyperlipidemia 12/12/2008  . Depression 12/12/2008  . DEGENERATIVE DISC DISEASE, CERVICAL SPINE, WITH MYELOPATHY 12/12/2008  . NICOTINE ADDICTION 06/14/2008  . Essential hypertension 06/14/2008  . OBSTRUCTIVE CHRONIC BRONCHITIS 06/14/2008    Past Surgical History:  Procedure Laterality Date  . Bladder Tuck    . CATARACT EXTRACTION Right 06/06/13  . CHOLECYSTECTOMY  1995  . POSTERIOR LAMINECTOMY / DECOMPRESSION CERVICAL SPINE  01/2009   Done by Dr. Danielle Dess  . right hip  11/2009   ORIF/ Done by Dr. Turner Daniels    OB History    No data available       Home Medications    Prior to Admission medications   Medication Sig Start Date End Date Taking? Authorizing Provider  acetaminophen (TYLENOL) 500 MG tablet Take 500 mg by mouth every 8 (eight) hours as needed.   Yes Historical Provider, MD  albuterol (VENTOLIN HFA) 108 (90 Base) MCG/ACT inhaler Inhale 2 puffs into the lungs every 6 (six) hours as needed for wheezing or shortness of breath. 08/22/15  Yes Corwin Levins, MD  aspirin EC 81 MG tablet Take 1 tablet (81 mg total) by mouth daily. 08/22/15  Yes Corwin Levins, MD  budesonide-formoterol [facility]) 160-4.5 MCG/ACT inhaler Inhale 2 puffs into the lungs 2 (two) times daily. 11/14/15  Yes Nyoka Cowden, MD  furosemide (LASIX) 20 MG tablet Take  1 tablet (20 mg total) by mouth daily. 01/01/16 03/31/16 Yes Rosalio MacadamiaLori C Gerhardt, NP  hydrOXYzine (ATARAX/VISTARIL) 50 MG tablet 1/2 tablet by mouth three times daily as needed for anxiety Patient taking differently: Take 25 mg by mouth See admin instructions. Pt takes 25mg  once daily - also can take 25mg  three times daily as needed for anxiety/depression 08/22/15  Yes Corwin LevinsJames W John, MD  ipratropium-albuterol (DUONEB) 0.5-2.5 (3) MG/3ML SOLN Take 3 mLs by nebulization every 6 (six) hours as needed (if inhaler does not resolve wheezing). 06/05/15  Yes  Calvert CantorSaima Rizwan, MD  losartan (COZAAR) 50 MG tablet Take 1 tablet (50 mg total) by mouth daily. 08/22/15  Yes Corwin LevinsJames W John, MD  nicotine (NICODERM CQ - DOSED IN MG/24 HOURS) 21 mg/24hr patch Place 21 mg onto the skin daily.   Yes Historical Provider, MD  rosuvastatin (CRESTOR) 10 MG tablet Take 1 tablet (10 mg total) by mouth daily. 08/22/15  Yes Corwin LevinsJames W John, MD  traZODone (DESYREL) 50 MG tablet Take 1 tablet (50 mg total) by mouth at bedtime as needed for sleep. 12/18/15  Yes Corwin LevinsJames W John, MD    Family History Family History  Problem Relation Age of Onset  . Heart attack Father     Social History Social History  Substance Use Topics  . Smoking status: Former Smoker    Packs/day: 0.50    Years: 53.00    Types: Cigarettes    Quit date: 09/10/2015  . Smokeless tobacco: Never Used  . Alcohol use 0.0 oz/week     Allergies   Ativan [lorazepam]   Review of Systems Review of Systems  Constitutional: Positive for fatigue. Negative for fever.  HENT: Negative for rhinorrhea and sore throat.   Eyes: Negative for redness.  Respiratory: Positive for shortness of breath. Negative for cough.   Cardiovascular: Negative for chest pain.  Gastrointestinal: Negative for abdominal pain, diarrhea, nausea and vomiting.  Genitourinary: Negative for dysuria.  Musculoskeletal: Negative for myalgias.  Skin: Negative for rash.  Neurological: Positive for weakness. Negative for headaches.  Hematological: Does not bruise/bleed easily.     Physical Exam Updated Vital Signs BP 124/70   Pulse 85   Resp 24   SpO2 95%   Physical Exam  Constitutional: She appears well-developed and well-nourished.  HENT:  Head: Normocephalic and atraumatic.  Mouth/Throat: Mucous membranes are normal. Mucous membranes are not dry.  Eyes:  Conjunctiva pale  Neck: Trachea normal and normal range of motion. Neck supple. Normal carotid pulses and no JVD present. No muscular tenderness present. Carotid bruit is not  present. No tracheal deviation present.  Cardiovascular: Normal rate, regular rhythm, S1 normal, S2 normal and intact distal pulses.  Exam reveals no decreased pulses.   Murmur (III/VI high pitched holosystolic) heard. Pulmonary/Chest: Effort normal. No respiratory distress. She has no wheezes. She exhibits no tenderness.  Abdominal: Soft. Normal aorta and bowel sounds are normal. There is no tenderness. There is no rebound and no guarding.  Musculoskeletal: Normal range of motion.  Neurological: She is alert.  Skin: Skin is warm and dry. She is not diaphoretic. No cyanosis. No pallor.  Psychiatric: She has a normal mood and affect.  Nursing note and vitals reviewed.    ED Treatments / Results  Labs (all labs ordered are listed, but only abnormal results are displayed) Labs Reviewed  CBC - Abnormal; Notable for the following:       Result Value   RBC 3.32 (*)    Hemoglobin 6.8 (*)  HCT 22.7 (*)    MCV 68.4 (*)    MCH 19.9 (*)    MCHC 29.1 (*)    RDW 16.1 (*)    All other components within normal limits  BASIC METABOLIC PANEL - Abnormal; Notable for the following:    Glucose, Bld 106 (*)    All other components within normal limits  POC OCCULT BLOOD, ED  TYPE AND SCREEN  ABO/RH  PREPARE RBC (CROSSMATCH)    EKG  EKG Interpretation None       Radiology No results found.  Procedures Procedures (including critical care time)  Medications Ordered in ED Medications  0.9 %  sodium chloride infusion (not administered)     Initial Impression / Assessment and Plan / ED Course  I have reviewed the triage vital signs and the nursing notes.  Pertinent labs & imaging results that were available during my care of the patient were reviewed by me and considered in my medical decision making (see chart for details).  Clinical Course    Patient seen and examined. Work-up initiated. Medications ordered. Discussed with Dr. Donnald GarrePfeiffer who will see.  Vital signs reviewed and  are as follows: BP 124/70   Pulse 85   Resp 24   SpO2 95%   11:33 AM Rectal exam with RN [employer] chaperone. Will admit for blood transfusion. Pt updated and agrees.   Spoke with Dr. York SpanielBuriev.   Final Clinical Impressions(s) / ED Diagnoses   Final diagnoses:  Symptomatic anemia   Unclear etiology.   New Prescriptions New Prescriptions   No medications on file     Renne CriglerJoshua Micki Cassel, PA-C 01/02/16 1134    Arby BarretteMarcy Pfeiffer, MD 01/03/16 1028

## 2016-01-02 NOTE — Telephone Encounter (Signed)
Ref# [id number]: Per [place] pt has Hgb of 6.8.  Per result not Norma FredricksonLori Gerhardt, NP has already spoken with pt/pt's dtr and advised ED for transfusion/eval.

## 2016-01-03 ENCOUNTER — Encounter: Payer: Self-pay | Admitting: Physician Assistant

## 2016-01-03 DIAGNOSIS — I350 Nonrheumatic aortic (valve) stenosis: Secondary | ICD-10-CM | POA: Diagnosis not present

## 2016-01-03 DIAGNOSIS — J449 Chronic obstructive pulmonary disease, unspecified: Secondary | ICD-10-CM | POA: Diagnosis not present

## 2016-01-03 DIAGNOSIS — F17210 Nicotine dependence, cigarettes, uncomplicated: Secondary | ICD-10-CM | POA: Diagnosis not present

## 2016-01-03 DIAGNOSIS — D649 Anemia, unspecified: Secondary | ICD-10-CM

## 2016-01-03 DIAGNOSIS — I10 Essential (primary) hypertension: Secondary | ICD-10-CM

## 2016-01-03 LAB — TYPE AND SCREEN
ABO/RH(D): A POS
Antibody Screen: NEGATIVE
UNIT DIVISION: 0
Unit division: 0

## 2016-01-03 LAB — CBC
HEMATOCRIT: 29.9 % — AB (ref 36.0–46.0)
Hemoglobin: 9.1 g/dL — ABNORMAL LOW (ref 12.0–15.0)
MCH: 21.3 pg — AB (ref 26.0–34.0)
MCHC: 30.4 g/dL (ref 30.0–36.0)
MCV: 69.9 fL — ABNORMAL LOW (ref 78.0–100.0)
Platelets: 179 10*3/uL (ref 150–400)
RBC: 4.28 MIL/uL (ref 3.87–5.11)
RDW: 17.2 % — AB (ref 11.5–15.5)
WBC: 5.9 10*3/uL (ref 4.0–10.5)

## 2016-01-03 MED ORDER — FERROUS SULFATE 325 (65 FE) MG PO TABS: 325.0000 mg | ORAL_TABLET | Freq: Every day | ORAL | 3 refills | Status: DC

## 2016-01-03 MED ORDER — PANTOPRAZOLE SODIUM 40 MG PO TBEC: 40.0000 mg | DELAYED_RELEASE_TABLET | Freq: Every day | ORAL | 0 refills | Status: DC

## 2016-01-03 NOTE — NC FL2 (Signed)
[place] MEDICAID FL2 LEVEL OF CARE SCREENING TOOL     IDENTIFICATION  Patient Name: Karina Andrews Birthdate: October 05, 1930 Sex: female Admission Date (Current Location): 01/02/2016  [place] [facility] [place]   Facility and Address:  [facility], [address], [place], [place] [address]      Provider Number: [id number]  Attending Physician Name and Address:  Clydia LlanoMutaz Elmahi, MD  Relative Name and Phone Number:  Sandi MariscalCuellar,Kristina -  Daughter; [phone]  [phone]     Current Level of Care: Hospital Recommended Level of Care: Assisted Living Facility ([facility]) Prior Approval Number:    Date Approved/Denied:   PASRR Number:    Discharge Plan: Other (Comment) (ALF [facility])    Current Diagnoses: Patient Active Problem List   Diagnosis Date Noted  . Symptomatic anemia 01/02/2016  . Anemia 01/02/2016  . Dementia 09/06/2015  . Gait disorder 08/22/2015  . COPD GOLD II  08/04/2015  . COPD (chronic obstructive pulmonary disease) (HCC) 07/01/2015  . Multiple fractures of ribs of right side 06/05/2015  . Encounter for palliative care   . Goals of care, counseling/discussion   . Closed C1 fracture (HCC) 06/02/2015  . Fracture of maxilla, closed (HCC) 06/02/2015  . Fall 05/31/2015  . Alcohol abuse 05/31/2015  . Dysarthria 05/17/2015  . Wellness examination 05/11/2015  . Indeterminate pulmonary nodules 05/11/2015  . COPD exacerbation (HCC) 05/05/2015  . Dyspnea 04/11/2015  . Cigarette smoker 04/11/2015  . Aortic stenosis, severe 06/15/2013  . Insomnia 06/15/2013  . Senile osteoporosis   . 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 Hyperlipidemia 12/12/2008  . Depression 12/12/2008  . DEGENERATIVE DISC DISEASE, CERVICAL SPINE, WITH MYELOPATHY 12/12/2008  . NICOTINE ADDICTION 06/14/2008  . Essential hypertension 06/14/2008  . OBSTRUCTIVE CHRONIC BRONCHITIS 06/14/2008    Orientation RESPIRATION BLADDER  Height & Weight     Self, Time, Situation, Place  O2 (2 Liters Oxygen) Incontinent Weight:   Height:     BEHAVIORAL SYMPTOMS/MOOD NEUROLOGICAL BOWEL NUTRITION STATUS      Continent Diet (2 grams sodium)  AMBULATORY STATUS COMMUNICATION OF NEEDS Skin   Independent Verbally Normal                       Personal Care Assistance Level of Assistance  Bathing, Feeding, Dressing Bathing Assistance: Independent Feeding assistance: Independent Dressing Assistance: Independent     Functional Limitations Info  Sight, Hearing, Speech Sight Info: Adequate Hearing Info: Adequate Speech Info: Impaired    SPECIAL CARE FACTORS FREQUENCY                       Contractures Contractures Info: Not present    Additional Factors Info  Code Status, Allergies Code Status Info: DNR Allergies Info: Ativan           Current Medications (01/03/2016):  This is the current hospital active medication list Current Facility-Administered Medications  Medication Dose Route Frequency Provider Last Rate Last Dose  . acetaminophen (TYLENOL) tablet 650 mg  650 mg Oral Q8H PRN Esperanza SheetsUlugbek N Buriev, MD   650 mg at 01/03/16 0524  . albuterol (PROVENTIL) (2.5 MG/3ML) 0.083% nebulizer solution 3 mL  3 mL Inhalation Q6H PRN Esperanza SheetsUlugbek N Buriev, MD      . furosemide (LASIX) tablet 20 mg  20 mg Oral Daily Esperanza SheetsUlugbek N Buriev, MD   20 mg at 01/03/16 1202  . hydrOXYzine (ATARAX/VISTARIL) tablet 25 mg  25 mg  Oral TID PRN Esperanza SheetsUlugbek N Buriev, MD   25 mg at 01/03/16 0256  . ipratropium-albuterol (DUONEB) 0.5-2.5 (3) MG/3ML nebulizer solution 3 mL  3 mL Nebulization Q6H PRN Esperanza SheetsUlugbek N Buriev, MD      . losartan (COZAAR) tablet 50 mg  50 mg Oral Daily Esperanza SheetsUlugbek N Buriev, MD   50 mg at 01/03/16 1202  . mometasone-formoterol (DULERA) 200-5 MCG/ACT inhaler 2 puff  2 puff Inhalation BID Esperanza SheetsUlugbek N Buriev, MD   2 puff at 01/03/16 0757  . nicotine (NICODERM CQ - dosed in mg/24 hours) patch 21 mg  21 mg Transdermal Daily Esperanza SheetsUlugbek N  Buriev, MD   21 mg at 01/03/16 1203  . rosuvastatin (CRESTOR) tablet 10 mg  10 mg Oral Daily Esperanza SheetsUlugbek N Buriev, MD   10 mg at 01/03/16 1202  . traZODone (DESYREL) tablet 50 mg  50 mg Oral QHS PRN Esperanza SheetsUlugbek N Buriev, MD         Discharge Medications: Please see discharge summary for a list of discharge medications.  Relevant Imaging Results:  Relevant Lab Results:   Additional Information DISCHARGE MEDICATIONS  TAKE these medications   acetaminophen 500 MG tablet Commonly known as:  TYLENOL Take 500 mg by mouth every 8 (eight) hours as needed.  albuterol 108 (90 Base) MCG/ACT inhaler Commonly known as:  VENTOLIN HFA Inhale 2 puffs into the lungs every 6 (six) hours as needed for wheezing or shortness of breath.  aspirin EC 81 MG tablet Take 1 tablet (81 mg total) by mouth daily.  budesonide-formoterol 160-4.5 MCG/ACT inhaler Commonly known as:  SYMBICORT Inhale 2 puffs into the lungs 2 (two) times daily.  ferrous sulfate 325 (65 FE) MG tablet Commonly known as:  FERROUSUL Take 1 tablet (325 mg total) by mouth at bedtime.  furosemide 20 MG tablet Commonly known as:  LASIX Take 1 tablet (20 mg total) by mouth daily.  hydrOXYzine 50 MG tablet Commonly known as:  ATARAX/VISTARIL 1/2 tablet by mouth three times daily as needed for anxiety What changed:  how much to take  how to take this  when to take this  additional instructions  ipratropium-albuterol 0.5-2.5 (3) MG/3ML Soln Commonly known as:  DUONEB Take 3 mLs by nebulization every 6 (six) hours as needed (if inhaler does not resolve wheezing).  losartan 50 MG tablet Commonly known as:  COZAAR Take 1 tablet (50 mg total) by mouth daily.  nicotine 21 mg/24hr patch Commonly known as:  NICODERM CQ - dosed in mg/24 hours Place 21 mg onto the skin daily.  pantoprazole 40 MG tablet Commonly known as:  PROTONIX Take 1 tablet (40 mg total) by mouth daily.  rosuvastatin 10 MG tablet Commonly known as:  CRESTOR Take 1  tablet (10 mg total) by mouth daily.  traZODone 50 MG tablet Commonly known as:  DESYREL Take 1 tablet (50 mg total) by mouth at bedtime as needed for sleep.       Cristobal Goldmannrawford, Gunnar Hereford Bradley, LCSW

## 2016-01-03 NOTE — Discharge Summary (Signed)
Physician Discharge Summary  CHENELL LOZON ZOX:[id number] DOB: Oct 27, 1930 DOA: 01/02/2016  PCP: Oliver Barre, MD  Admit date: 01/02/2016 Discharge date: 01/03/2016  Admitted From: Home Disposition: Home  Recommendations for Outpatient Follow-up:  1. Follow up with PCP in 1-2 weeks, Follow with [facility] on 01/09/2016 2. Please obtain BMP/CBC in one week  Home Health: Home health PT Equipment/Devices:NA  Discharge Condition: Stable CODE STATUS: DNR Diet recommendation: Diet Heart Room service appropriate? Yes; Fluid consistency: Thin Diet 2 gram sodium Room service appropriate? Yes; Fluid consistency: Thin  Brief/Interim Summary: Karina Andrews is a 82 y.o. female with PMH of HTN, COPD on home oxygen, Severe Aortic Stenosis presented from ALF with abnormal labs. Patient was seen at cardiology office yesterday for ongoing evaluation for aortic stenosis. She was called today to come to emergency room because her Hg is 6.8. Patient denies acute signs or symptoms of acute bleeding. Denies abdominal pains, no hematemesis, no hematochezia, no diarrhea, no bright red blood per rectum. Although she reports one episode of black stool few days ago, which is resolved. Rectal exam, occult blood test in ED was negative. She reports mild SOB for few days, no acute chest pains, no focal weakness. No blood in urine.  -ED: repeat Hg is still 6.8. Patient is started on 2 units of blood TYF. hospitalist is called for overnight observation.    Discharge Diagnoses:  Active Problems:   Essential hypertension   Aortic stenosis, severe   Cigarette smoker   COPD (chronic obstructive pulmonary disease) (HCC)   Symptomatic anemia   Anemia   Symptomatic anemia, microcytic iron deficiency anemia -Patient reported having shortness of breath for the past several days, she was even seen by her cardiologist for that. -Presented with hemoglobin of 6.8, baseline hemoglobin was 10 from April of this  year. -Anemia panel showed iron of 9 and ferritin of 3 and high TIBC of 498. MCV is 68. -FOBT negative 2 in the emergency department. -Status post transfusion of 2 units of packed RBCs hemoglobin improved to 9.1. -Discussed with the [facility] PA, recommended follow-up with outpatient, follow-up made to be seen on 01/09/2016. -Microcytic iron deficiency anemia secondary to occult GI bleed versus impaired iron absorption. -She is on low-dose aspirin, this is continued because there is no evidence of bleed for now. Started on Protonix. -Started on iron supplements  Severe aortic stenosis  -LVEF of 60-65% with good diastolic dysfunction on last echo in March 2017. -Echo showed severe stenosis with valve area of 0.75 cm and mean gradient of 44 mmHg and peak radiant of 71 mmHg.  Chronic respiratory failure and COPD -Patient is on 2 L of oxygen at home, continued.  Speech difficulty -Patient has speech difficulty, has other neurological findings also like gait impairment and urinary incontinence. -She was seen by neurology previously an MRI was negative. -Recommend to try to refer to a movement disorder specialist to rule out NPH and Parkinson's.   Discharge Instructions     Medication List    TAKE these medications   acetaminophen 500 MG tablet Commonly known as:  TYLENOL Take 500 mg by mouth every 8 (eight) hours as needed.   albuterol 108 (90 Base) MCG/ACT inhaler Commonly known as:  VENTOLIN HFA Inhale 2 puffs into the lungs every 6 (six) hours as needed for wheezing or shortness of breath.   aspirin EC 81 MG tablet Take 1 tablet (81 mg total) by mouth daily.   budesonide-formoterol 160-4.5 MCG/ACT inhaler Commonly known  as:  SYMBICORT Inhale 2 puffs into the lungs 2 (two) times daily.   ferrous sulfate 325 (65 FE) MG tablet Commonly known as:  FERROUSUL Take 1 tablet (325 mg total) by mouth at bedtime.   furosemide 20 MG tablet Commonly known as:  LASIX Take 1  tablet (20 mg total) by mouth daily.   hydrOXYzine 50 MG tablet Commonly known as:  ATARAX/VISTARIL 1/2 tablet by mouth three times daily as needed for anxiety What changed:  how much to take  how to take this  when to take this  additional instructions   ipratropium-albuterol 0.5-2.5 (3) MG/3ML Soln Commonly known as:  DUONEB Take 3 mLs by nebulization every 6 (six) hours as needed (if inhaler does not resolve wheezing).   losartan 50 MG tablet Commonly known as:  COZAAR Take 1 tablet (50 mg total) by mouth daily.   nicotine 21 mg/24hr patch Commonly known as:  NICODERM CQ - dosed in mg/24 hours Place 21 mg onto the skin daily.   pantoprazole 40 MG tablet Commonly known as:  PROTONIX Take 1 tablet (40 mg total) by mouth daily.   rosuvastatin 10 MG tablet Commonly known as:  CRESTOR Take 1 tablet (10 mg total) by mouth daily.   traZODone 50 MG tablet Commonly known as:  DESYREL Take 1 tablet (50 mg total) by mouth at bedtime as needed for sleep.      Follow-up Information    Unk LightningJennifer Lynne Lemmon, [place] Follow up on 01/09/2016.   Specialty:  Gastroenterology Why:  @ 11:15 for IDA Contact information: [address] [place] [address] [phone]          Allergies  Allergen Reactions  . Ativan [Lorazepam] Other (See Comments)    Causes increased confusion/AMS    Consultations:  None   Procedures (Echo, Carotid, EGD, Colonoscopy, ERCP)   Radiological studies:  No results found.  Subjective:  Discharge Exam: Vitals:   01/02/16 2100 01/03/16 0552 01/03/16 0750 01/03/16 0926  BP: (!) 142/59 120/62  (!) 99/50  Pulse: 85 83  86  Resp: 20 18  17   Temp: 97.6 F (36.4 C) 97.7 F (36.5 C)  97.9 F (36.6 C)  TempSrc: Oral Oral  Oral  SpO2: 96% 97% 97% 94%   General: Pt is alert, awake, not in acute distress Cardiovascular: RRR, S1/S2 +, no rubs, no gallops Respiratory: CTA bilaterally, no wheezing, no rhonchi Abdominal: Soft, NT,  ND, bowel sounds + Extremities: no edema, no cyanosis   The results of significant diagnostics from this hospitalization (including imaging, microbiology, ancillary and laboratory) are listed below for reference.    Microbiology: No results found for this or any previous visit (from the past 240 hour(s)).   Labs: BNP (last 3 results)  Recent Labs  01/01/16 1446  BNP 18.2   Basic Metabolic Panel:  Recent Labs Lab 01/01/16 1446 01/02/16 1011  NA 142 141  K 4.7 3.8  CL 107 108  CO2 24 26  GLUCOSE 117* 106*  BUN 21 15  CREATININE 0.67 0.63  CALCIUM 9.1 9.1   Liver Function Tests:  Recent Labs Lab 01/01/16 1446  AST 16  ALT 11  ALKPHOS 88  BILITOT 0.2  PROT 7.0  ALBUMIN 4.0   No results for input(s): LIPASE, AMYLASE in the last 168 hours. No results for input(s): AMMONIA in the last 168 hours. CBC:  Recent Labs Lab 01/01/16 1446 01/02/16 1011 01/03/16 0455  WBC 7.4 5.4 5.9  HGB 6.8*  6.8* 9.1*  HCT 23.8* 22.7* 29.9*  MCV 71.0* 68.4* 69.9*  PLT 218 174 179   Cardiac Enzymes: No results for input(s): CKTOTAL, CKMB, CKMBINDEX, TROPONINI in the last 168 hours. BNP: Invalid input(s): POCBNP CBG: No results for input(s): GLUCAP in the last 168 hours. D-Dimer No results for input(s): DDIMER in the last 72 hours. Hgb A1c No results for input(s): HGBA1C in the last 72 hours. Lipid Profile No results for input(s): CHOL, HDL, LDLCALC, TRIG, CHOLHDL, LDLDIRECT in the last 72 hours. Thyroid function studies  Recent Labs  01/01/16 1446  TSH 2.01   Anemia work up  Recent Labs  01/02/16 1204 01/02/16 1347  FERRITIN 3*  --   TIBC 498*  --   IRON 9*  --   RETICCTPCT  --  1.4   Urinalysis    Component Value Date/Time   COLORURINE YELLOW 04/11/2015 1731   APPEARANCEUR CLEAR 04/11/2015 1731   LABSPEC 1.015 04/11/2015 1731   PHURINE 6.0 04/11/2015 1731   GLUCOSEU NEGATIVE 04/11/2015 1731   HGBUR TRACE-INTACT (A) 04/11/2015 1731   BILIRUBINUR  NEGATIVE 04/11/2015 1731   KETONESUR 15 (A) 04/11/2015 1731   PROTEINUR NEGATIVE 12/04/2009 2312   UROBILINOGEN 0.2 04/11/2015 1731   NITRITE POSITIVE (A) 04/11/2015 1731   LEUKOCYTESUR Trace (A) 04/11/2015 1731   Sepsis Labs Invalid input(s): PROCALCITONIN,  WBC,  LACTICIDVEN Microbiology No results found for this or any previous visit (from the past 240 hour(s)).   Time coordinating discharge: Over 30 minutes  SIGNED:   Clint LippsELMAHI,Retha Bither A, MD  [facility] 01/03/2016, 10:40 AM Pager   If 7PM-7AM, please contact night-coverage [email or web address] Password [email or web address]

## 2016-01-03 NOTE — Clinical Social Work Note (Signed)
[facility], transported by daughter. Lilly CoveLee Eanes with [facility] contacted regarding discharge and clinicals transmitted electronically. Daughter aware of discharge and transported patient home.   Genelle BalVanessa Colbie Sliker, MSW, LCSW Licensed Clinical Social Worker Clinical Social Work Department [facility] [phone]

## 2016-01-03 NOTE — Progress Notes (Signed)
Discharge instructions RX's follow up appts and home health PT orders explain and provided to patients daughter who is transporting patient back to [facility]F. Patients daughter verbalized understanding of all discharge instructions. Patient left floor via wheelchair accompanied by staff, no c/o pain or shortness of breath.  Janalee Grobe, Kae HellerMiranda Lynn, RN

## 2016-01-03 NOTE — Care Management Note (Addendum)
Case Management Note  Patient Details  Name: Karina Andrews MRN: [id number] Date of Birth: [birth year]  Subjective/Objective:        CM following for progression and d/c planning.             Action/Plan: 01/03/2016 Met with pt who states that she resides in the ALF of [facility]. She wants to return to that level of care with PT as provided by [facility]. This CM noted sticky note re request for CSW to contact [place] social worker. This CM informed CSW, Crawford Givens. Will enter resume PT orders unless this pt needs to d/c to SNF level of care. Await CSW discussion with [facility] SW. Will continue to follow.  Spoke with [place] who states that they will get orders to [facility] , the [place] agency they use at that facility if we will provide new orders and face to face. There were completed by Dr Hartford Poli and copied to accompany pt back to her ALF at [facility].  Expected Discharge Date:     01/03/2016             Expected Discharge Plan:  Assisted Living / Rest Home  In-House Referral:  Clinical Social Work  Discharge planning Services  CM Consult  Post Acute Care Choice:  Home Health Choice offered to:  Patient  DME Arranged:   NA DME Agency:   NA  HH Arranged:  PT, OT HH Agency:   [facility] of Service:  In process, will continue to follow  If discussed at Long Length of Stay Meetings, dates discussed:    Additional Comments:  Adron Bene, RN 01/03/2016, 10:15 AM

## 2016-01-03 NOTE — Clinical Social Work Note (Signed)
Clinical Social Work Assessment  Patient Details  Name: Karina Andrews MRN: [id number] Date of Birth: 08/18/1930  Date of referral:  01/03/16               Reason for consult:  Facility Placement (From [facility])                Permission sought to share information with:  Other (Talked to daughter as patient was leaving hospital) Permission granted to share information::  No  Name::     Sandi MariscalKristina Cuellar  Agency::     Relationship::  Daughter  Contact Information:  [phone] (h)  [phone] (c)  Housing/Transportation Living arrangements for the past 2 months:  Assisted Living Facility ([facility]) Source of Information:  Facility, Other (Comment Required) [facility](Hospital chart and daughter) Patient Interpreter Needed:  None Criminal Activity/Legal Involvement Pertinent to Current Situation/Hospitalization:  No - Comment as needed Significant Relationships:  Adult Children Lives with:  Facility Resident Do you feel safe going back to the place where you live?  Yes Need for family participation in patient care:  Yes (Comment)  Care giving concerns:  Not discussed with patient or daughter. Patient was adamant that she was going to return to her apartment and not [facility].  Social Worker assessment / plan:  CSW talked with daughter very briefly as she was leaving with patient to [facility]. Confirmed return with daughter.  Employment status:  Retired Database administratornsurance information:  Managed Medicare PT Recommendations:  Not assessed at this time Information / Referral to community resources:  Other (Comment Required) (No information needed or requested as patient from facility)  Patient/Family's Response to care:  No concerns expressed regarding care during hospitalization.  Patient/Family's Understanding of and Emotional Response to Diagnosis, Current Treatment, and Prognosis:  Not discussed.  Emotional  Assessment Appearance:  Appears stated age Attitude/Demeanor/Rapport:  Unable to Assess (CSW saw patient and talked briefly with daughter as she was leaving hospital) Affect (typically observed):  Unable to Assess Orientation:  Oriented to Self, Oriented to Place, Oriented to  Time, Oriented to Situation Alcohol / Substance use:  Tobacco Use, Alcohol Use, Illicit Drugs (Patient reported that she quit smoking, drinks alcohol and does not use illicit drugs) Psych involvement (Current and /or in the community):  No (Comment)  Discharge Needs  Concerns to be addressed:  Discharge Planning Concerns Readmission within the last 30 days:  No Current discharge risk:  None Barriers to Discharge:  No Barriers Identified   Cristobal GoldmannCrawford, Alisa Stjames Bradley, LCSW 01/03/2016, 4:10 PM

## 2016-01-04 ENCOUNTER — Telehealth: Payer: Self-pay | Admitting: *Deleted

## 2016-01-04 NOTE — Telephone Encounter (Signed)
Transition Care Management Follow-up Telephone Call   Date discharged? 01/03/16   How have you been since you were released from the hospital? Spoke w/daughter Karina Andrews she stated that she was not w/mom right now but she seem to be feeling ok   Do you understand why you were in the hospital? YES   Do you understand the discharge instructions? YES  Where were you discharged to? Home  Items Reviewed:  Medications reviewed: YES, per daughter there was no changes   Allergies reviewed: YES  Dietary changes reviewed: NO  Referrals reviewed: No referral needed   Functional Questionnaire:   Activities of Daily Living (ADLs):   She states she are independent in the following: bathing and hygiene, feeding, continence, grooming, toileting and dressing States she require assistance with the following: ambulation, grooming and dressing sometimes   Any transportation issues/concerns?: NO   Any patient concerns? NO   Confirmed importance and date/time of follow-up visits scheduled YES, appt 01/15/16  Provider Appointment booked with Dr. Jonny RuizJohn  Confirmed with patient if condition begins to worsen call PCP or go to the ER.  Patient was given the office number and encouraged to call back with question or concerns.  : YES

## 2016-01-09 ENCOUNTER — Telehealth: Payer: Self-pay | Admitting: Physician Assistant

## 2016-01-09 ENCOUNTER — Other Ambulatory Visit: Payer: Self-pay

## 2016-01-09 ENCOUNTER — Encounter (INDEPENDENT_AMBULATORY_CARE_PROVIDER_SITE_OTHER): Payer: Self-pay

## 2016-01-09 ENCOUNTER — Encounter: Payer: Self-pay | Admitting: Physician Assistant

## 2016-01-09 ENCOUNTER — Other Ambulatory Visit (INDEPENDENT_AMBULATORY_CARE_PROVIDER_SITE_OTHER): Payer: Commercial Managed Care - HMO

## 2016-01-09 ENCOUNTER — Ambulatory Visit (INDEPENDENT_AMBULATORY_CARE_PROVIDER_SITE_OTHER): Payer: Commercial Managed Care - HMO | Admitting: Physician Assistant

## 2016-01-09 VITALS — BP 100/70 | HR 84 | Wt 133.4 lb

## 2016-01-09 DIAGNOSIS — D508 Other iron deficiency anemias: Secondary | ICD-10-CM | POA: Diagnosis not present

## 2016-01-09 DIAGNOSIS — R12 Heartburn: Secondary | ICD-10-CM | POA: Diagnosis not present

## 2016-01-09 DIAGNOSIS — K5900 Constipation, unspecified: Secondary | ICD-10-CM | POA: Diagnosis not present

## 2016-01-09 DIAGNOSIS — D509 Iron deficiency anemia, unspecified: Secondary | ICD-10-CM

## 2016-01-09 LAB — HEMOGLOBIN: Hemoglobin: 10.1 g/dL — ABNORMAL LOW (ref 12.0–15.0)

## 2016-01-09 LAB — IBC PANEL
Iron: 34 ug/dL — ABNORMAL LOW (ref 42–145)
SATURATION RATIOS: 6.8 % — AB (ref 20.0–50.0)
Transferrin: 357 mg/dL (ref 212.0–360.0)

## 2016-01-09 LAB — FERRITIN: FERRITIN: 46.3 ng/mL (ref 10.0–291.0)

## 2016-01-09 NOTE — Patient Instructions (Signed)
Please go to the basement level to have your labs drawn.  We will call your daughter with the lab results.  [facility].

## 2016-01-09 NOTE — Progress Notes (Signed)
Chief Complaint: IDA, Constipation, Heartburn  HPI: [name] is an 82 year old female with a past medical history of alcohol abuse, anxiety, aortic stenosis, cervical spondylosis with myelopathy, COPD, dementia, depression, heart murmur (LVEF at 60-65% on echo in March 2017), hypertension, on home oxygen at 2 L, osteoporosis and speech impediment, who was referred to me by Karina Andrews, Karina W, MD for a complaint of iron deficiency anemia.      According to chart review patient was recently admitted to the hospital from 01/02/2016 through 01/03/2016 for symptomatic anemia. She had presented from her cardiology office after further evaluation for aortic stenosis and a finding of hemoglobin at 6.8. Patient had reported 1 episode of black stool a few days prior which had resolved, rectal exam occult blood test in ED was negative. She did report mild shortness of breath for a few days. Patient was given 2 units of blood. Patient was started on Protonix and iron supplements. Recent hemoglobin 9.1 on 01/13/16, iron studies on 01/02/2016 show a low iron at 9 and a percent saturation low at 2%. Fecal occult blood test on 01/02/2016 and was negative.   Today, it should be noted that the patient's history is slightly limited due to her speech impediment and memory impairment. The patient explains to me that she was in the hospital overnight and did receive some blood. She then returned to her assisted living facility. She tells me that she has only seen some discoloration in her stools over the past 2 days, noting that they are more black in color and she is slightly constipated. The patient started prunes recently and this has been helping. She does tell me that she has been taking iron for at least the past 2-3 days, but is unsure if she was on this previously. She tells me that she feels generally well, denying any weakness or shortness of breath.   Patient does describe some daily heartburn symptoms over the past  few months which have subsided over the past 2 weeks. She was using Tums on a daily basis for this, but no longer needs them. She asked what medicine to take in the future if she has repeat of symptoms.   Patient's social history is positive for being from [place]. She immigrated over here at the age of 82.   Patient denies fever, chills, bright red blood or black tarry sticky stools, weight loss, anorexia, nausea, vomiting, reflux or abdominal pain. She also tells me she has never had a colonoscopy or endoscopy in her life.    Past Medical History:  Diagnosis Date  . Alcohol abuse   . Anemia   . Anxiety   . Aortic stenosis    moderate on echo 09/2013  . Cervical spondylosis with myelopathy 01/2009   s/p decompression  . COPD (chronic obstructive pulmonary disease) (HCC)   . Dementia 09/06/2015  . Depression   . Fall at home 05/2015  . Heart murmur   . History of blood transfusion 01/02/2016   "anemia"  . Hypertension   . On home oxygen therapy    "2L; 24/7" (01/02/2016)  . Osteoporosis    (R) hip fx 11/2009 & (L) hip fx 2006  . Speech impediment    present for >9 months; no one has really been able to discern an etiology/daughter/notes 01/01/2016    Past Surgical History:  Procedure Laterality Date  . BACK SURGERY    . BLADDER SUSPENSION     "unsuccessful"  . CATARACT EXTRACTION Andrews/ INTRAOCULAR  LENS  IMPLANT, BILATERAL Bilateral 06/06/13 - 2016   "right - left"  . FRACTURE SURGERY    . HIP FRACTURE SURGERY Left 1980s  . LAPAROSCOPIC CHOLECYSTECTOMY  1995  . ORIF HIP FRACTURE Right 11/2009   Done by Dr. Turner Andrews  . POSTERIOR LAMINECTOMY / DECOMPRESSION CERVICAL SPINE  01/2009   Done by Dr. Danielle Andrews  . WRIST FRACTURE SURGERY [place]    [place]"    Current Outpatient Prescriptions  Medication Sig Dispense Refill  . acetaminophen (TYLENOL) 500 MG tablet Take 500 mg by mouth every 8 (eight) hours as needed.    Marland Kitchen. albuterol (VENTOLIN HFA) 108 (90 Base) MCG/ACT  inhaler Inhale 2 puffs into the lungs every 6 (six) hours as needed for wheezing or shortness of breath. 18 g 11  . aspirin EC 81 MG tablet Take 1 tablet (81 mg total) by mouth daily. 90 tablet 11  . budesonide-formoterol (SYMBICORT) 160-4.5 MCG/ACT inhaler Inhale 2 puffs into the lungs 2 (two) times daily. 1 Inhaler 0  . ferrous sulfate (FERROUSUL) 325 (65 FE) MG tablet Take 1 tablet (325 mg total) by mouth at bedtime. 30 tablet 3  . furosemide (LASIX) 20 MG tablet Take 1 tablet (20 mg total) by mouth daily. 90 tablet 3  . hydrOXYzine (ATARAX/VISTARIL) 50 MG tablet 1/2 tablet by mouth three times daily as needed for anxiety (Patient taking differently: Take 25 mg by mouth See admin instructions. Pt takes 25mg  once daily - also can take 25mg  three times daily as needed for anxiety/depression) 90 tablet 3  . ipratropium-albuterol (DUONEB) 0.5-2.5 (3) MG/3ML SOLN Take 3 mLs by nebulization every 6 (six) hours as needed (if inhaler does not resolve wheezing). 360 mL 0  . losartan (COZAAR) 50 MG tablet Take 1 tablet (50 mg total) by mouth daily. 90 tablet 3  . nicotine (NICODERM CQ - DOSED IN MG/24 HOURS) 21 mg/24hr patch Place 21 mg onto the skin daily.    . pantoprazole (PROTONIX) 40 MG tablet Take 1 tablet (40 mg total) by mouth daily. 30 tablet 0  . rosuvastatin (CRESTOR) 10 MG tablet Take 1 tablet (10 mg total) by mouth daily. 90 tablet 3  . traZODone (DESYREL) 50 MG tablet Take 1 tablet (50 mg total) by mouth at bedtime as needed for sleep. 30 tablet 5   No current facility-administered medications for this visit.     Allergies as of 01/09/2016 - Review Complete 01/02/2016  Allergen Reaction Noted  . Ativan [lorazepam] Other (See Comments) 06/05/2015    Family History  Problem Relation Age of Onset  . Heart attack Father     Social History   Social History  . Marital status: Divorced    Spouse name: N/A  . Number of children: N/A  . Years of education: N/A   Occupational History    . Not on file.   Social History Main Topics  . Smoking status: Former Smoker    Packs/day: 0.50    Years: 53.00    Types: Cigarettes    Quit date: 09/10/2015  . Smokeless tobacco: Never Used  . Alcohol use 0.0 oz/week     Comment: 01/02/2016 "last drink was before 09/10/2015  . Drug use: No  . Sexual activity: No   Other Topics Concern  . Not on file   Social History Narrative  . No narrative on file    Review of Systems:     Constitutional: No weight loss, fever, chills, weakness or fatigue HEENT:  Eyes: No change in vision               Ears, Nose, Throat:  No change in hearing or congestion Skin: No rash or itching Cardiovascular: No chest pain, chest pressure or palpitations   Respiratory: No SOB or cough Gastrointestinal: See HPI and otherwise negative Genitourinary: No dysuria or change in urinary frequency Neurological: No headache, dizziness or syncope Musculoskeletal: No new muscle or joint pain Hematologic: No bleeding or bruising Psychiatric: No history of depression or anxiety    Physical Exam:  Vital signs: BP 100/70 (BP Location: Left Arm, Patient Position: Sitting, Cuff Size: Normal)   Pulse 84   Wt 133 lb 6.4 oz (60.5 kg)   BMI 23.63 kg/m   General:   Pleasant elderly Caucasian female appears to be in NAD, Well developed, Well nourished, alert and cooperative Head:  Normocephalic and atraumatic. Eyes:   PEERL, EOMI. No icterus. Conjunctiva pink. Ears:  Normal auditory acuity. Neck:  Supple Throat: Oral cavity and pharynx without inflammation, swelling or lesion. Lungs: Respirations even and unlabored. Lungs clear to auscultation bilaterally.   No wheezes, crackles, or rhonchi.  Heart: Normal S1, S2. 5/6 systolic ejection murmur Regular rate and rhythm. No peripheral edema, cyanosis or pallor.  Abdomen:  Soft, nondistended, nontender. No rebound or guarding. Normal bowel sounds. No appreciable masses or hepatomegaly. Rectal:  Not performed.  Msk:   Symmetrical without gross deformities.  Extremities:  Without edema, no deformity or joint abnormality. Ambulates in wheelchair Neurologic:  Alert and  oriented x4;  grossly normal neurologically.  Skin:   Dry and intact without significant lesions or rashes. Psychiatric: Oriented to person, place and time. Memory impairment, speech impediment  RELEVANT LABS AND IMAGING: CBC    Component Value Date/Time   WBC 5.9 01/03/2016 0455   RBC 4.28 01/03/2016 0455   HGB 9.1 (L) 01/03/2016 0455   HCT 29.9 (L) 01/03/2016 0455   PLT 179 01/03/2016 0455   MCV 69.9 (L) 01/03/2016 0455   MCH 21.3 (L) 01/03/2016 0455   MCHC 30.4 01/03/2016 0455   RDW 17.2 (H) 01/03/2016 0455   LYMPHSABS 1.0 05/31/2015 0046   MONOABS 0.9 05/31/2015 0046   EOSABS 0.1 05/31/2015 0046   BASOSABS 0.0 05/31/2015 0046    CMP     Component Value Date/Time   NA 141 01/02/2016 1011   NA 144 06/11/2015   K 3.8 01/02/2016 1011   CL 108 01/02/2016 1011   CO2 26 01/02/2016 1011   GLUCOSE 106 (H) 01/02/2016 1011   BUN 15 01/02/2016 1011   BUN 19 06/11/2015   CREATININE 0.63 01/02/2016 1011   CREATININE 0.67 01/01/2016 1446   CALCIUM 9.1 01/02/2016 1011   PROT 7.0 01/01/2016 1446   ALBUMIN 4.0 01/01/2016 1446   AST 16 01/01/2016 1446   ALT 11 01/01/2016 1446   ALKPHOS 88 01/01/2016 1446   BILITOT 0.2 01/01/2016 1446   GFRNONAA >60 01/02/2016 1011   GFRAA >60 01/02/2016 1011   Results for DANYIA, BORUNDA (MRN [id number]) as of 01/09/2016 11:12  Ref. Range 01/02/2016 12:04  Iron Latest Ref Range: 28 - 170 ug/dL 9 (L)  UIBC Latest Units: ug/dL 409  TIBC Latest Ref Range: 250 - 450 ug/dL 811 (H)  Saturation Ratios Latest Ref Range: 10.4 - 31.8 % 2 (L)  Ferritin Latest Ref Range: 11 - 307 ng/mL 3 (L)    Assessment: 1. IDA: Patient admitted to the hospital in early November for symptomatic anemia, given 2 units  PRBCs with an increase in hemoglobin from around 6-9.1, patient had reported some "black stools"  around that time, currently has severe aortic stenosis with an LVEF of 60-65% on O2 at home for COPD 2. Constipation: Patient reports constipation with iron supplementation, better with prunes 3. Heartburn: Patient tells me she had daily heartburn for a few months for which she took many TUMS , but this has subsided over the past 2 weeks, per review of MAR, patient is on protonix 40mg  qd as prescribed at time of d/c from the hospital  Plan: 1. Recheck iron studies and hemoglobin today. If patient doing well on oral iron supplements, recommend she continue these. If hemoglobin and iron continue to drop, recommend further evaluation with possible EGD and colonoscopy versus imaging studies. Discussed this with the patient. She would like to avoid anesthesia if at all possible. 2. Discussed above with patient. Explained that she is very high risk for procedures due to her COPD and aortic stenosis, at this time we will see if we can avoid this. 3. If patient does need procedures these will need to be performed at the hospital due to chronic oxygen use. 4. Patient should continue prunes for her constipation, discussed that she can add MiraLAX if these stop working for her 5. Continue Pantoprazole 40mg  qd 6. Patient to follow with Dr. Lavon Paganini in the future.  Hyacinth Meeker, PA-C [facility] 01/09/2016, 11:10 AM  Cc: Karina Levins, MD

## 2016-01-10 ENCOUNTER — Encounter: Payer: Self-pay | Admitting: Internal Medicine

## 2016-01-10 DIAGNOSIS — I10 Essential (primary) hypertension: Secondary | ICD-10-CM | POA: Diagnosis not present

## 2016-01-10 NOTE — Telephone Encounter (Signed)
Call returned to the pt and her daughter states she spoke with the admitting MD's nurse and all her questions were answered.  She will call back if she has further concerns

## 2016-01-10 NOTE — Progress Notes (Signed)
Reviewed and agree with documentation and assessment and plan. K. Veena Nandigam , MD   

## 2016-01-15 ENCOUNTER — Encounter: Payer: Self-pay | Admitting: Internal Medicine

## 2016-01-15 ENCOUNTER — Ambulatory Visit (INDEPENDENT_AMBULATORY_CARE_PROVIDER_SITE_OTHER): Payer: Commercial Managed Care - HMO | Admitting: Internal Medicine

## 2016-01-15 DIAGNOSIS — J449 Chronic obstructive pulmonary disease, unspecified: Secondary | ICD-10-CM | POA: Diagnosis not present

## 2016-01-15 DIAGNOSIS — F411 Generalized anxiety disorder: Secondary | ICD-10-CM

## 2016-01-15 DIAGNOSIS — D509 Iron deficiency anemia, unspecified: Secondary | ICD-10-CM

## 2016-01-15 DIAGNOSIS — J9611 Chronic respiratory failure with hypoxia: Secondary | ICD-10-CM | POA: Diagnosis not present

## 2016-01-15 DIAGNOSIS — K5900 Constipation, unspecified: Secondary | ICD-10-CM

## 2016-01-15 DIAGNOSIS — I350 Nonrheumatic aortic (valve) stenosis: Secondary | ICD-10-CM

## 2016-01-15 MED ORDER — POLYETHYLENE GLYCOL 3350 17 GM/SCOOP PO POWD: 17.0000 g | Freq: Every day | ORAL | 5 refills | Status: DC

## 2016-01-15 MED ORDER — HYDROXYZINE HCL 50 MG PO TABS: ORAL_TABLET | ORAL | 3 refills | Status: DC

## 2016-01-15 NOTE — Progress Notes (Signed)
Subjective:    Patient ID: Karina Andrews, female    DOB: 1930-07-18, 82 y.o.   MRN: [id number]  HPI  Karina LericheGertrud L Winslowis a 82 y.o.femalewith PMH of HTN, COPD on home oxygen, Severe Aortic Stenosis, hospd 11/8-11/9 for symptomatic anemia (SOB) but no overt bleeding, and DRE neg for blood.  Transfused 2 u PRBC and observed overnight. Anemia panel showed iron of 9 and ferritin of 3 and high TIBC of 498. MCV is 68.felt to be c/w microcytic iron deficiency anemia secondary to occult GI bleed versus impaired iron absorption. Low dose asa held, started PPI, and iron supplements. Sats remained stable, still and now with 2L [place]  Since d/c pt has had constipation more symptomatic, intolerant of trazodone so no longer takes.  Has ongoing anxiety, atarax helps but would do better if scheduled per family.  Denies significant sedation at low dose.  No other new complaints.  No other changes to history Past Medical History:  Diagnosis Date  . Alcohol abuse   . Anemia   . Anxiety   . Aortic stenosis    moderate on echo 09/2013  . Cervical spondylosis with myelopathy 01/2009   s/p decompression  . COPD (chronic obstructive pulmonary disease) (HCC)   . Dementia 09/06/2015  . Depression   . Fall at home 05/2015  . Heart murmur   . History of blood transfusion 01/02/2016   "anemia"  . Hypertension   . On home oxygen therapy    "2L; 24/7" (01/02/2016)  . Osteoporosis    (R) hip fx 11/2009 & (L) hip fx 2006  . Speech impediment    present for >9 months; no one has really been able to discern an etiology/daughter/notes 01/01/2016   Past Surgical History:  Procedure Laterality Date  . BACK SURGERY    . BLADDER SUSPENSION     "unsuccessful"  . CATARACT EXTRACTION W/ INTRAOCULAR LENS  IMPLANT, BILATERAL Bilateral 06/06/13 - 2016   "right - left"  . FRACTURE SURGERY    . HIP FRACTURE SURGERY Left 1980s  . LAPAROSCOPIC CHOLECYSTECTOMY  1995  . ORIF HIP FRACTURE Right 11/2009   Done by Dr. Turner Danielsowan  .  POSTERIOR LAMINECTOMY / DECOMPRESSION CERVICAL SPINE  01/2009   Done by Dr. Danielle DessElsner  . WRIST FRACTURE SURGERY Bilateral    [place]"    reports that she quit smoking about 4 months ago. Her smoking use included Cigarettes. She has a 26.50 pack-year smoking history. She has never used smokeless tobacco. She reports that she drinks alcohol. She reports that she does not use drugs. family history includes Heart attack in her father. Allergies  Allergen Reactions  . Ativan [Lorazepam] Other (See Comments)    Causes increased confusion/AMS   Review of Systems  Constitutional: Negative for unusual diaphoresis or night sweats HENT: Negative for ear swelling or discharge Eyes: Negative for worsening visual haziness  Respiratory: Negative for choking and stridor.   Gastrointestinal: Negative for distension or worsening eructation Genitourinary: Negative for retention or change in urine volume.  Musculoskeletal: Negative for other MSK pain or swelling Skin: Negative for color change and worsening wound Psychiatric/Behavioral: Negative for decreased concentration or agitation other than above   All other system neg per pt1    Objective:   Physical Exam BP 138/78   Pulse 91   Temp 98.2 F (36.8 C) (Oral)   Resp 20   Wt 133 lb (60.3 kg)   SpO2 94%   BMI 23.56  kg/m  VS noted, fatigued, non toxic, examined in wheelchair Constitutional: Pt appears in no apparent distress HENT: Head: NCAT.  Right Ear: External ear normal.  Left Ear: External ear normal.  Eyes: . Pupils are equal, round, and reactive to light. Conjunctivae and EOM are normal Neck: Normal range of motion. Neck supple.  Cardiovascular: Normal rate and regular rhythm.   Pulmonary/Chest: Effort normal and breath sounds decreased without rales or wheezing.  Abd:  Soft, NT, ND, + BS, no flank tender Neurological: Pt is alert. At baseline confused  Skin: Skin is warm. No rash, no LE edema Psychiatric: Pt  behavior is normal. No agitation.  No other new exam findings    Assessment & Plan:

## 2016-01-15 NOTE — Patient Instructions (Signed)
Ok for MotorolaMiralax daily for constipation  OK to stop the trazodone  Please see Cardiology tomorrow to see about restarting the Aspirin  OK to take the hydroxyzine three times daily (scheduled)  Please continue all other medications as before, and refills have been done if requested.  Please have the pharmacy call with any other refills you may need.  Please keep your appointments with your specialists as you may have planned

## 2016-01-15 NOTE — Progress Notes (Signed)
Pre visit review using our clinic review tool, if applicable. No additional management support is needed unless otherwise documented below in the visit note. 

## 2016-01-16 ENCOUNTER — Encounter (INDEPENDENT_AMBULATORY_CARE_PROVIDER_SITE_OTHER): Payer: Self-pay

## 2016-01-16 ENCOUNTER — Ambulatory Visit (INDEPENDENT_AMBULATORY_CARE_PROVIDER_SITE_OTHER): Payer: Commercial Managed Care - HMO | Admitting: Cardiovascular Disease

## 2016-01-16 ENCOUNTER — Encounter: Payer: Self-pay | Admitting: Cardiovascular Disease

## 2016-01-16 VITALS — BP 114/62 | HR 88 | Ht 63.0 in

## 2016-01-16 DIAGNOSIS — I350 Nonrheumatic aortic (valve) stenosis: Secondary | ICD-10-CM

## 2016-01-16 NOTE — Progress Notes (Signed)
Chief Complaint  Patient presents with  . Aortic Stenosis   History of Present Illness: 82 yo female with history of tobacco abuse, anxiety, cervical spondylosis, severe COPD on continuous supplemental O2, aortic stenosis, HTN and HLD who is here today in the valve clinic for discussion regarding her aortic valve stenosis. She has been followed in our office by Dr. Mayford Knife for aortic stenosis. Echo March 2017 with normal LV function, severe aortic valve stenosis with mean gradient 44 mmHg, 71 mmHg, AVA 0.75 cm2. She was seen by Norma Fredrickson, NP 01/02/16 and was found to have Hgb of 6.8. She was admitted and given 2 units pRBCs. Heme negative stools. She was seen in the [facility] office 01/09/16 and was continued on iron therapy. No plans for further evaluation. She is followed by Dr. Sherene Sires in [facility] for COPD. She is now on continuous supplemental oxygen therapy. This was started 4 weeks ago. She has had a speech impediment for the past 6 months following a cervical spine surgery. She has been seen in the Neurology clinic and per pt and her family, there is not suspicion of stroke. There is mention of dementia in the chart but the patient's daughter denies this. She is actually sharp today and recalls events from the past 70 years with ease. She immigrated here from [place] when she was 6 and worked in the travel Media planner and with the [employer]. She has smoked 1/2-1ppd for 60 years, stopping in the last 4 months.  She has dyspnea and fatigue. No chest pain, dizziness, near syncope or syncope. No LE edema.    Primary Care Physician: Oliver Barre, MD Primary Cardiology: Mayford Knife  Past Medical History:  Diagnosis Date  . Alcohol abuse   . Anemia   . Anxiety   . Aortic stenosis    moderate on echo 09/2013  . Cervical spondylosis with myelopathy 01/2009   s/p decompression  . COPD (chronic obstructive pulmonary disease) (HCC)   . Dementia 09/06/2015  . Depression   . Fall  at home 05/2015  . Heart murmur   . History of blood transfusion 01/02/2016   "anemia"  . Hypertension   . On home oxygen therapy    "2L; 24/7" (01/02/2016)  . Osteoporosis    (R) hip fx 11/2009 & (L) hip fx 2006  . Speech impediment    present for >9 months; no one has really been able to discern an etiology/daughter/notes 01/01/2016    Past Surgical History:  Procedure Laterality Date  . BACK SURGERY    . BLADDER SUSPENSION     "unsuccessful"  . CATARACT EXTRACTION W/ INTRAOCULAR LENS  IMPLANT, BILATERAL Bilateral 06/06/13 - 2016   "right - left"  . FRACTURE SURGERY    . HIP FRACTURE SURGERY Left 1980s  . LAPAROSCOPIC CHOLECYSTECTOMY  1995  . ORIF HIP FRACTURE Right 11/2009   Done by Dr. Turner Daniels  . POSTERIOR LAMINECTOMY / DECOMPRESSION CERVICAL SPINE  01/2009   Done by Dr. Danielle Dess  . WRIST FRACTURE SURGERY Bilateral    "one in [place]; one [place]"    Current Outpatient Prescriptions  Medication Sig Dispense Refill  . acetaminophen (TYLENOL) 500 MG tablet Take 500 mg by mouth every 8 (eight) hours as needed.    Marland Kitchen albuterol (VENTOLIN HFA) 108 (90 Base) MCG/ACT inhaler Inhale 2 puffs into the lungs every 6 (six) hours as needed for wheezing or shortness of breath. 18 g 11  . aspirin EC 81 MG tablet Take 1  tablet (81 mg total) by mouth daily. 90 tablet 11  . budesonide-formoterol (SYMBICORT) 160-4.5 MCG/ACT inhaler Inhale 2 puffs into the lungs 2 (two) times daily. 1 Inhaler 0  . ferrous sulfate (FERROUSUL) 325 (65 FE) MG tablet Take 1 tablet (325 mg total) by mouth at bedtime. 30 tablet 3  . furosemide (LASIX) 20 MG tablet Take 1 tablet (20 mg total) by mouth daily. 90 tablet 3  . hydrOXYzine (ATARAX/VISTARIL) 50 MG tablet 1/2 tablet by mouth three times daily as needed for anxiety 45 tablet 3  . ipratropium-albuterol (DUONEB) 0.5-2.5 (3) MG/3ML SOLN Take 3 mLs by nebulization every 6 (six) hours as needed (if inhaler does not resolve wheezing). 360 mL 0  . losartan (COZAAR) 50 MG  tablet Take 1 tablet (50 mg total) by mouth daily. 90 tablet 3  . nicotine (NICODERM CQ - DOSED IN MG/24 HOURS) 21 mg/24hr patch Place 21 mg onto the skin daily.    . pantoprazole (PROTONIX) 40 MG tablet Take 1 tablet (40 mg total) by mouth daily. 30 tablet 0  . polyethylene glycol powder (GLYCOLAX/MIRALAX) powder Take 17 g by mouth daily. 3350 g 5  . rosuvastatin (CRESTOR) 10 MG tablet Take 1 tablet (10 mg total) by mouth daily. 90 tablet 3   No current facility-administered medications for this visit.     Allergies  Allergen Reactions  . Ativan [Lorazepam] Other (See Comments)    Causes increased confusion/AMS    Social History   Social History  . Marital status: Divorced    Spouse name: N/A  . Number of children: 2  . Years of education: N/A   Occupational History  . Retired-travel agent    Social History Main Topics  . Smoking status: Former Smoker    Packs/day: 0.50    Years: 63.00    Types: Cigarettes    Quit date: 09/10/2015  . Smokeless tobacco: Never Used  . Alcohol use 0.0 oz/week     Comment: 01/02/2016 "last drink was before 09/10/2015  . Drug use: No  . Sexual activity: No   Other Topics Concern  . Not on file   Social History Narrative  . No narrative on file    Family History  Problem Relation Age of Onset  . Heart attack Father     Review of Systems:  As stated in the HPI and otherwise negative.   BP 114/62   Pulse 88   Ht 5\' 3"  (1.6 m)   BMI 23.56 kg/m   Physical Examination: General: Well developed, well nourished, NAD  HEENT: OP clear, mucus membranes moist  SKIN: warm, dry. No rashes. Neuro: No focal deficits  Musculoskeletal: Muscle strength 5/5 all ext  Psychiatric: Mood and affect normal  Neck: No JVD, no carotid bruits, no thyromegaly, no lymphadenopathy.  Lungs: Poor air movement in all lung fields. No wheezing. Dry crackles noted in the bases.  Cardiovascular: Regular rate and rhythm. Loud, harsh systolic murmur. No gallops or  rubs. Abdomen:Soft. Bowel sounds present. Non-tender.  Extremities: No lower extremity edema. Pulses are 2 + in the bilateral DP/PT.  Echo March 2017: Left ventricle: The cavity size was normal. Wall thickness was   normal. Systolic function was normal. The estimated ejection   fraction was in the range of 60% to 65%. Wall motion was normal;   there were no regional wall motion abnormalities. Doppler   parameters are consistent with abnormal left ventricular   relaxation (grade 1 diastolic dysfunction). Doppler parameters   are consistent  with high ventricular filling pressure. - Aortic valve: There was severe stenosis. There was trivial   regurgitation. Mean gradient (S): 44 mm Hg. Peak gradient (S): 71   mm Hg. Valve area (VTI): 0.75 cm^2. Valve area (Vmax): 0.78 cm^2.   Valve area (Vmean): 0.75 cm^2. - Mitral valve: Calcified annulus.  EKG:  EKG is not ordered today. The ekg from 01/01/16 is reviewed by me today and shows sinus rhythm.   Recent Labs: 04/11/2015: Pro B Natriuretic peptide (BNP) 33.0 01/01/2016: ALT 11; Brain Natriuretic Peptide 18.2; TSH 2.01 01/02/2016: BUN 15; Creatinine, Ser 0.63; Potassium 3.8; Sodium 141 01/03/2016: Platelets 179 01/09/2016: Hemoglobin 10.1     Wt Readings from Last 3 Encounters:  01/15/16 133 lb (60.3 kg)  01/09/16 133 lb 6.4 oz (60.5 kg)  01/01/16 134 lb (60.8 kg)     Other studies Reviewed: Additional studies/ records that were reviewed today include: I have personally reviewed her EKG and echo images. Review of the above records demonstrates: Echo review shows severe AS  Assessment and Plan:   1. Severe aortic valve stenosis: She has dyspnea and fatigue which is limiting. It is likely that her dyspnea is a combination of severe COPD (now on continuous supplemental oxygen) and her severe aortic valve stenosis. I have personally reviewed the echo images. The aortic valve is thickened, calcified with limited leaflet mobility. It is not  clear if she would benefit from AVR from a symptom standpoint given her severe underlying lung disease. She also has newly diagnosed anemia of uncertain etiology. There is no evidence of GI bleeding. She is not a candidate for conventional AVR by surgical approach. I also do not think she is a good candidate for TAVR. It is not likely that she would significant improvement in her dyspnea with TAVR given her severe COPD. I have reviewed the TAVR procedure in detail today with the patient and her daughter. I would like to refer her to see one of our CT surgeons on the TAVR team for another opinion. She is not anxious to proceed with invasive procedures.   Current medicines are reviewed at length with the patient today.  The patient does not have concerns regarding medicines.  The following changes have been made:  no change  Labs/ tests ordered today include:   Orders Placed This Encounter  Procedures  . Ambulatory referral to Cardiothoracic Surgery   Disposition:   FU with our TAVR team (CT surgery office).   Signed, Verne Carrowhristopher McAlhany, MD 01/16/2016 4:58 PM    [facility] [address], [place], [place]  [address] Phone: [phone]; Fax: [phone]) [phone]

## 2016-01-16 NOTE — Patient Instructions (Addendum)
Medication Instructions:  Resume aspirin 81 mg daily.   Labwork: none  Testing/Procedures: none  Follow-Up: Keep scheduled appointment with Dr. Mayford Knifeurner on 01/28/16  Any Other Special Instructions Will Be Listed Below (If Applicable).  We will contact the surgeon's office and they will contact you to schedule an appointment in their office.    If you need a refill on your cardiac medications before your next appointment, please call your pharmacy.

## 2016-01-20 DIAGNOSIS — F411 Generalized anxiety disorder: Secondary | ICD-10-CM | POA: Insufficient documentation

## 2016-01-20 NOTE — Assessment & Plan Note (Addendum)
Also for cardiology f/u tomorrow, consider restart asa

## 2016-01-20 NOTE — Assessment & Plan Note (Signed)
stable overall by history and exam, recent data reviewed with pt, and pt to continue medical treatment as before,  to f/u any worsening symptoms or concerns @LASTSAO2(3)@  

## 2016-01-20 NOTE — Assessment & Plan Note (Signed)
Has planned f/u cbc, last results stable Lab Results  Component Value Date   WBC 5.9 01/03/2016   HGB 10.1 (L) 01/09/2016   HCT 29.9 (L) 01/03/2016   MCV 69.9 (L) 01/03/2016   PLT 179 01/03/2016

## 2016-01-20 NOTE — Assessment & Plan Note (Signed)
With recent start iron supplements, for miralax daily

## 2016-01-20 NOTE — Assessment & Plan Note (Signed)
Mild to mod, to change atarax to scheduled.  to f/u any worsening symptoms or concerns

## 2016-01-20 NOTE — Assessment & Plan Note (Signed)
Stable, no evidence for exacerbation, cont same tx

## 2016-01-22 ENCOUNTER — Encounter: Payer: Self-pay | Admitting: Internal Medicine

## 2016-01-22 DIAGNOSIS — I10 Essential (primary) hypertension: Secondary | ICD-10-CM | POA: Diagnosis not present

## 2016-01-25 DIAGNOSIS — Z9181 History of falling: Secondary | ICD-10-CM | POA: Diagnosis not present

## 2016-01-25 DIAGNOSIS — R0602 Shortness of breath: Secondary | ICD-10-CM | POA: Diagnosis not present

## 2016-01-25 DIAGNOSIS — M6281 Muscle weakness (generalized): Secondary | ICD-10-CM | POA: Diagnosis not present

## 2016-01-25 DIAGNOSIS — J441 Chronic obstructive pulmonary disease with (acute) exacerbation: Secondary | ICD-10-CM | POA: Diagnosis not present

## 2016-01-25 DIAGNOSIS — J449 Chronic obstructive pulmonary disease, unspecified: Secondary | ICD-10-CM | POA: Diagnosis not present

## 2016-01-28 ENCOUNTER — Ambulatory Visit: Payer: Commercial Managed Care - HMO | Admitting: Cardiology

## 2016-01-28 ENCOUNTER — Telehealth: Payer: Self-pay

## 2016-01-28 NOTE — Telephone Encounter (Signed)
Patient was scheduled for OV with Dr. Mayford Knifeurner today. Patient is currently being worked up for possible TAVR and is waiting to be seen by Dr. Cornelius Moraswen. Per Dr. Mayford Knifeurner, informed patient's daughter that OV today with Dr. Mayford Knifeurner should be cancelled until AFTER she talks to Dr. Cornelius Moraswen. Will await OV with Dr. Cornelius Moraswen and follow-up with her then.   OV cancelled today.

## 2016-01-29 ENCOUNTER — Other Ambulatory Visit: Payer: Self-pay | Admitting: *Deleted

## 2016-01-29 DIAGNOSIS — I350 Nonrheumatic aortic (valve) stenosis: Secondary | ICD-10-CM

## 2016-01-29 NOTE — Progress Notes (Unsigned)
pt's

## 2016-01-31 ENCOUNTER — Ambulatory Visit (HOSPITAL_COMMUNITY)
Admission: RE | Admit: 2016-01-31 | Discharge: 2016-01-31 | Disposition: A | Discharge status: Home / Self Care | Payer: Commercial Managed Care - HMO | Source: Ambulatory Visit | Attending: Thoracic Surgery (Cardiothoracic Vascular Surgery) | Admitting: Thoracic Surgery (Cardiothoracic Vascular Surgery)

## 2016-01-31 DIAGNOSIS — I350 Nonrheumatic aortic (valve) stenosis: Secondary | ICD-10-CM | POA: Insufficient documentation

## 2016-01-31 LAB — BLOOD GAS, ARTERIAL
ACID-BASE EXCESS: 3.6 mmol/L — AB (ref 0.0–2.0)
Bicarbonate: 27.4 mmol/L (ref 20.0–28.0)
DRAWN BY: 27052
FIO2: 21
O2 Saturation: 92 %
PCO2 ART: 40.2 mmHg (ref 32.0–48.0)
PH ART: 7.449 (ref 7.350–7.450)
Patient temperature: 98.6
pO2, Arterial: 64.8 mmHg — ABNORMAL LOW (ref 83.0–108.0)

## 2016-01-31 LAB — PULMONARY FUNCTION TEST
FEF 25-75 Post: 0.25 L/sec
FEF 25-75 Pre: 0.3 L/sec
FEF2575-%CHANGE-POST: -15 %
FEF2575-%PRED-POST: 20 %
FEF2575-%Pred-Pre: 24 %
FEV1-%CHANGE-POST: -1 %
FEV1-%Pred-Post: 37 %
FEV1-%Pred-Pre: 37 %
FEV1-PRE: 0.68 L
FEV1-Post: 0.67 L
FEV1FVC-%CHANGE-POST: 3 %
FEV1FVC-%Pred-Pre: 71 %
FEV6-%CHANGE-POST: -4 %
FEV6-%PRED-PRE: 56 %
FEV6-%Pred-Post: 53 %
FEV6-PRE: 1.29 L
FEV6-Post: 1.24 L
FEV6FVC-%Change-Post: 1 %
FEV6FVC-%PRED-PRE: 105 %
FEV6FVC-%Pred-Post: 106 %
FVC-%CHANGE-POST: -5 %
FVC-%PRED-POST: 50 %
FVC-%Pred-Pre: 53 %
FVC-POST: 1.24 L
FVC-Pre: 1.31 L
POST FEV1/FVC RATIO: 54 %
POST FEV6/FVC RATIO: 100 %
PRE FEV6/FVC RATIO: 99 %
Pre FEV1/FVC ratio: 52 %

## 2016-01-31 MED ORDER — ALBUTEROL SULFATE (2.5 MG/3ML) 0.083% IN NEBU
2.5000 mg | INHALATION_SOLUTION | Freq: Once | RESPIRATORY_TRACT | Status: AC
Start: 2016-01-31 — End: 2016-01-31
  Administered 2016-01-31: 2.5 mg via RESPIRATORY_TRACT

## 2016-02-04 ENCOUNTER — Encounter (HOSPITAL_COMMUNITY): Payer: Commercial Managed Care - HMO

## 2016-02-05 ENCOUNTER — Encounter: Payer: Self-pay | Admitting: Thoracic Surgery (Cardiothoracic Vascular Surgery)

## 2016-02-05 ENCOUNTER — Institutional Professional Consult (permissible substitution) (INDEPENDENT_AMBULATORY_CARE_PROVIDER_SITE_OTHER): Payer: Commercial Managed Care - HMO | Admitting: Thoracic Surgery (Cardiothoracic Vascular Surgery)

## 2016-02-05 VITALS — BP 108/62 | HR 97 | Resp 18 | Ht 63.0 in | Wt 130.0 lb

## 2016-02-05 DIAGNOSIS — I350 Nonrheumatic aortic (valve) stenosis: Secondary | ICD-10-CM

## 2016-02-05 DIAGNOSIS — I10 Essential (primary) hypertension: Secondary | ICD-10-CM | POA: Diagnosis not present

## 2016-02-05 NOTE — Patient Instructions (Signed)
Continue all previous medications without any changes at this time  Consider referral to Hospice for assistance with palliative care

## 2016-02-05 NOTE — Progress Notes (Signed)
[facility]  MULTIDISCIPLINARY HEART VALVE CLINIC  CARDIOTHORACIC SURGERY CONSULTATION REPORT  Referring Provider is Kathleene Hazel* MD Primary Cardiologist is Quintella Reichert, MD PCP is Oliver Barre, MD  Chief Complaint  Patient presents with  . Aortic Stenosis    Surgical eval for TAVR procedure, PFT's.ABG's, ECHO 04/26/15     HPI:  Patient is an 82 year old female with history of aortic stenosis, severe COPD on home oxygen therapy, hypertension, iron deficient anemia, progressive dysphonia, and severe kyphoscoliosis with degenerative arthritis and osteopenia associated with severely limited physical mobility who has been referred for surgical consultation to discuss treatment options for management of severe symptomatic aortic stenosis.  The patient has been followed for several years by Dr. Mayford Knife for management of aortic stenosis and hypertension. Echocardiogram performed in 2015 was felt to be consistent with moderate aortic stenosis with normal left ventricular systolic function. She has underlying severe COPD with long-standing tobacco abuse. Nearly 1 year ago she began to develop progressive symptoms of dyspnea on exertion and resting shortness of breath as well as rapidly declining physical mobility with instability of gait and several falls. She was seen in follow-up by Dr. Mayford Knife and repeat echocardiogram revealed progression of severity of aortic stenosis with peak velocity across the aortic valve ranging between 3.8 and 4.2 m/s corresponding to mean transvalvular gradient estimated as high as 44 mmHg and aortic valve area calculated 0.78 cm.  At that time she also was noted to have progressive dysphonia. She was referred for neurology consultation and consultation in the multidisciplinary heart valve clinic.  An MRI of the brain was performed and she was seen in consultation by Dr. Vickey Huger who felt there was no evidence to suggest the presence of dementia or  previous stroke. Prior to being evaluated in the multidisciplinary heart valve clinic she suffered a fall resulting in a broken scapula and multiple ribs. She was hospitalized in the very next day fell in the hospital and suffered a closed C1 fracture and maxillary fracture.  Her subsequent convalescence was quite slow. Since then she has continued to decline physically. She has been essentially wheelchair-bound since July. She now lives in an apartment in assisted living at [facility].  She has been oxygen dependent and is severely dyspneic at rest. She is very weak and ambulates very little. She spends much of her day in a motorized wheelchair.  She was seen in follow-up by Norma Fredrickson at [facility] in early November and found to be severely anemic. She was hospitalized briefly and transfuse 2 units packed red blood cells.  Stools were heme negative and she was treated with iron therapy. She was referred back to the multidisciplinary heart valve clinic and evaluated by Dr. Clifton James on 01/16/2016. The patient has now been referred for surgical consultation.  The patient is originally from [place] and has been divorced for several years. She has a long-standing history of heavy tobacco abuse and more recently carries the diagnosis of severe COPD. She has been oxygen dependent since her hospitalization last spring. She is very weak and has poor appetite although weight is stable. She is dyspneic at rest and cannot do much of anything without getting short of breath. She has an intermittent nonproductive cough. She denies any exertional chest pain or chest tightness. She denies any dizzy spells or syncope. She is essentially wheelchair-bound and is severely tremulous just trying to stand up with assistance. She is accompanied by her daughter for her office consultation today.  Her voice is shrill and breathless. She interrupts her speech in the middle of sentences to take breaths.    Past Medical History:   Diagnosis Date  . Alcohol abuse   . Anemia   . Anxiety   . Aortic stenosis    moderate on echo 09/2013  . Cervical spondylosis with myelopathy 01/2009   s/p decompression  . COPD (chronic obstructive pulmonary disease) (HCC)   . Dementia 09/06/2015  . Depression   . Fall at home 05/2015  . Heart murmur   . History of blood transfusion 01/02/2016   "anemia"  . Hypertension   . On home oxygen therapy    "2L; 24/7" (01/02/2016)  . Osteoporosis    (R) hip fx 11/2009 & (L) hip fx 2006  . Speech impediment    present for >9 months; no one has really been able to discern an etiology/daughter/notes 01/01/2016    Past Surgical History:  Procedure Laterality Date  . BACK SURGERY    . BLADDER SUSPENSION     "unsuccessful"  . CATARACT EXTRACTION W/ INTRAOCULAR LENS  IMPLANT, BILATERAL Bilateral 06/06/13 - 2016   "right - left"  . FRACTURE SURGERY    . HIP FRACTURE SURGERY Left 1980s  . LAPAROSCOPIC CHOLECYSTECTOMY  1995  . ORIF HIP FRACTURE Right 11/2009   Done by Dr. Turner Danielsowan  . POSTERIOR LAMINECTOMY / DECOMPRESSION CERVICAL SPINE  01/2009   Done by Dr. Danielle DessElsner  . WRIST FRACTURE SURG[place]    "[place]"    Family History  Problem Relation Age of Onset  . Heart attack Father     Social History   Social History  . Marital status: Divorced    Spouse name: N/A  . Number of children: 2  . Years of education: N/A   Occupational History  . Retired-travel agent    Social History Main Topics  . Smoking status: Former Smoker    Packs/day: 0.50    Years: 63.00    Types: Cigarettes    Quit date: 09/10/2015  . Smokeless tobacco: Never Used  . Alcohol use 0.0 oz/week     Comment: 01/02/2016 "last drink was before 09/10/2015  . Drug use: No  . Sexual activity: No   Other Topics Concern  . Not on file   Social History Narrative  . No narrative on file    Current Outpatient Prescriptions  Medication Sig Dispense Refill  . acetaminophen (TYLENOL) 500 MG  tablet Take 500 mg by mouth every 8 (eight) hours as needed.    Marland Kitchen. albuterol (VENTOLIN HFA) 108 (90 Base) MCG/ACT inhaler Inhale 2 puffs into the lungs every 6 (six) hours as needed for wheezing or shortness of breath. 18 g 11  . aspirin EC 81 MG tablet Take 1 tablet (81 mg total) by mouth daily. 90 tablet 11  . budesonide-formoterol (SYMBICORT) 160-4.5 MCG/ACT inhaler Inhale 2 puffs into the lungs 2 (two) times daily. 1 Inhaler 0  . ferrous sulfate (FERROUSUL) 325 (65 FE) MG tablet Take 1 tablet (325 mg total) by mouth at bedtime. 30 tablet 3  . furosemide (LASIX) 20 MG tablet Take 1 tablet (20 mg total) by mouth daily. 90 tablet 3  . hydrOXYzine (ATARAX/VISTARIL) 50 MG tablet 1/2 tablet by mouth three times daily as needed for anxiety 45 tablet 3  . ipratropium-albuterol (DUONEB) 0.5-2.5 (3) MG/3ML SOLN Take 3 mLs by nebulization every 6 (six) hours as needed (if inhaler does not resolve wheezing). 360 mL 0  .  losartan (COZAAR) 50 MG tablet Take 1 tablet (50 mg total) by mouth daily. 90 tablet 3  . nicotine (NICODERM CQ - DOSED IN MG/24 HOURS) 21 mg/24hr patch Place 21 mg onto the skin daily.    . OXYGEN Inhale 2 L/min into the lungs continuous.    . pantoprazole (PROTONIX) 40 MG tablet Take 1 tablet (40 mg total) by mouth daily. 30 tablet 0  . polyethylene glycol powder (GLYCOLAX/MIRALAX) powder Take 17 g by mouth daily. 3350 g 5  . rosuvastatin (CRESTOR) 10 MG tablet Take 1 tablet (10 mg total) by mouth daily. 90 tablet 3   No current facility-administered medications for this visit.     Allergies  Allergen Reactions  . Ativan [Lorazepam] Other (See Comments)    Causes increased confusion/AMS      Review of Systems:   General:  marginal appetite, poor energy, no weight gain, no weight loss, no fever  Cardiac:  no chest pain with exertion, no chest pain at rest, + SOB with exertion, + resting SOB, no PND, + orthopnea, no palpitations, no arrhythmia, no atrial fibrillation, no LE edema,  no dizzy spells, no syncope  Respiratory:  + shortness of breath, + home oxygen, no productive cough, + dry cough, no bronchitis, + wheezing, no hemoptysis, no asthma, no pain with inspiration or cough, no sleep apnea, no CPAP at night  GI:   no difficulty swallowing, no reflux, no frequent heartburn, no hiatal hernia, no abdominal pain, + constipation, no diarrhea, no hematochezia, no hematemesis, no melena  GU:   no dysuria,  no frequency, no urinary tract infection, no hematuria, no kidney stones, no kidney disease  Vascular:  no pain suggestive of claudication, no pain in feet, no leg cramps, no varicose veins, no DVT, no non-healing foot ulcer  Neuro:   no stroke, no TIA's, no seizures, no headaches, no temporary blindness one eye,  no slurred speech, no peripheral neuropathy, + chronic pain, severe instability of gait, no memory/cognitive dysfunction  Musculoskeletal: + arthritis, no joint swelling, no myalgias, severe difficulty walking, very limited mobility   Skin:   no rash, no itching, no skin infections, no pressure sores or ulcerations  Psych:   + anxiety, no depression, no nervousness, no unusual recent stress  Eyes:   no blurry vision, no floaters, no recent vision changes, no wears glasses or contacts  ENT:   no hearing loss, no loose or painful teeth  Hematologic:  + easy bruising, no abnormal bleeding, no clotting disorder, no frequent epistaxis  Endocrine:  no diabetes, does not check CBG's at home           Physical Exam:   BP 108/62 (BP Location: Left Arm, Patient Position: Sitting, Cuff Size: Large)   Pulse 97   Resp 18   Ht 5\' 3"  (1.6 m)   Wt 130 lb (59 kg)   SpO2 92% Comment: ON 2L O2 CONTINUOUS  BMI 23.03 kg/m   General:  Elderly and very frail-appearing, dyspneic with speech on O2  HEENT:  Unremarkable   Neck:   no JVD, no bruits, no adenopathy   Chest:   clear to auscultation, symmetrical breath sounds, no wheezes, no rhonchi   CV:   RRR, grade IV/VI  crescendo/decrescendo murmur heard best at RSB,  no diastolic murmur  Abdomen:  soft, non-tender, no masses   Extremities:  warm, well-perfused, pulses not palpable, no LE edema  Rectal/GU  Deferred  Neuro:   Grossly non-focal and symmetrical throughout  Skin:   Clean and dry, no rashes, no breakdown, very frail   Diagnostic Tests:  Transthoracic Echocardiography  Patient:    Karina Andrews, Karina Andrews MR #:       [id number] Study Date: 04/26/2015 Gender:     F Age:        32 Height:     160 cm Weight:     61.7 kg BSA:        1.67 m^2 Pt. Status: Room:   SONOGRAPHER  Junious Dresser, RDCS  ORDERING     Corwin Levins  ATTENDING    Marca Ancona, M.D.  PERFORMING   [facility], Outpatient  cc:  ------------------------------------------------------------------- LV EF: 60% -   65%  ------------------------------------------------------------------- Indications:      Dyspnea (R06.00).  As (I35.9).  ------------------------------------------------------------------- History:   PMH:  Acquired from the patient and from the patient&'s chart.  Dyspnea.  Moderate aortic stenosis.  Chronic obstructive pulmonary disease.  Risk factors:  Current tobacco use. Hypertension.  ------------------------------------------------------------------- Study Conclusions  - Left ventricle: The cavity size was normal. Wall thickness was   normal. Systolic function was normal. The estimated ejection   fraction was in the range of 60% to 65%. Wall motion was normal;   there were no regional wall motion abnormalities. Doppler   parameters are consistent with abnormal left ventricular   relaxation (grade 1 diastolic dysfunction). Doppler parameters   are consistent with high ventricular filling pressure. - Aortic valve: There was severe stenosis. There was trivial   regurgitation. Mean gradient (S): 44 mm Hg. Peak gradient (S): 71   mm Hg. Valve area (VTI): 0.75 cm^2. Valve area (Vmax): 0.78 cm^2.    Valve area (Vmean): 0.75 cm^2. - Mitral valve: Calcified annulus.  ------------------------------------------------------------------- Labs, prior tests, procedures, and surgery: Echocardiography (August 2016).    The aortic valve showed moderate stenosis.  EF was 70%. Aortic valve: peak gradient of 44 mm Hg and mean gradient of 28 mm Hg.  Transthoracic echocardiography.  M-mode, complete 2D, spectral Doppler, and color Doppler.  Birthdate:  Patient birthdate: Nov 15, 1930.  Age:  Patient is 82 yr old.  Sex:  Gender: female. BMI: 24.1 kg/m^2.  Blood pressure:     138/80  Patient status: Outpatient.  Study date:  Study date: 04/26/2015. Study time: 03:20 PM.  Location:  [facility]  -------------------------------------------------------------------  ------------------------------------------------------------------- Left ventricle:  The cavity size was normal. Wall thickness was normal. Systolic function was normal. The estimated ejection fraction was in the range of 60% to 65%. Wall motion was normal; there were no regional wall motion abnormalities. Doppler parameters are consistent with abnormal left ventricular relaxation (grade 1 diastolic dysfunction). Doppler parameters are consistent with high ventricular filling pressure.  ------------------------------------------------------------------- Aortic valve:   Trileaflet; moderately calcified leaflets. Doppler:   There was severe stenosis.   There was trivial regurgitation.    VTI ratio of LVOT to aortic valve: 0.2. Valve area (VTI): 0.75 cm^2. Indexed valve area (VTI): 0.45 cm^2/m^2. Peak velocity ratio of LVOT to aortic valve: 0.2. Valve area (Vmax): 0.78 cm^2. Indexed valve area (Vmax): 0.47 cm^2/m^2. Mean velocity ratio of LVOT to aortic valve: 0.2. Valve area (Vmean): 0.75 cm^2. Indexed valve area (Vmean): 0.45 cm^2/m^2.    Mean gradient (S): 44 mm Hg. Peak gradient (S): 71 mm  Hg.  ------------------------------------------------------------------- Aorta:  Aortic root: The aortic root was normal in size. Ascending aorta: The ascending aorta was normal in size.  ------------------------------------------------------------------- Mitral valve:   Calcified annulus. Leaflet separation was  normal. Doppler:  Transvalvular velocity was within the normal range. There was no evidence for stenosis. There was no regurgitation.    Peak gradient (D): 3 mm Hg.  ------------------------------------------------------------------- Left atrium:  The atrium was normal in size.  ------------------------------------------------------------------- Right ventricle:  The cavity size was normal. Wall thickness was normal. Systolic function was normal.  ------------------------------------------------------------------- Pulmonic valve:   Poorly visualized.  Doppler:  There was no significant regurgitation.  ------------------------------------------------------------------- Tricuspid valve:   Structurally normal valve.   Leaflet separation was normal.  Doppler:  Transvalvular velocity was within the normal range. There was no regurgitation.  ------------------------------------------------------------------- Right atrium:  The atrium was normal in size.  ------------------------------------------------------------------- Pericardium:  There was no pericardial effusion.  ------------------------------------------------------------------- Systemic veins: Inferior vena cava: The vessel was dilated. The respirophasic diameter changes were blunted (< 50%), consistent with elevated central venous pressure.  ------------------------------------------------------------------- Measurements   Left ventricle                            Value          Reference  LV ID, ED, PLAX chordal           (L)     34.7  mm       43 - 52  LV ID, ES, PLAX chordal                    25.1  mm       23 - 38  LV fx shortening, PLAX chordal    (L)     28    %        >=29  LV PW thickness, ED                       10.1  mm       ---------  IVS/LV PW ratio, ED                       1.23           <=1.3  Stroke volume, 2D                         68    ml       ---------  Stroke volume/bsa, 2D                     41    ml/m^2   ---------  LV e&', lateral                            6.14  cm/s     ---------  LV E/e&', lateral                          13.18          ---------  LV e&', medial                             4.5   cm/s     ---------  LV E/e&', medial                           17.98          ---------  LV e&', average                            5.32  cm/s     ---------  LV E/e&', average                          15.21          ---------    Ventricular septum                        Value          Reference  IVS thickness, ED                         12.4  mm       ---------    LVOT                                      Value          Reference  LVOT ID, S                                22    mm       ---------  LVOT area                                 3.8   cm^2     ---------  LVOT ID                                   22    mm       ---------  LVOT peak velocity, S                     86.5  cm/s     ---------  LVOT mean velocity, S                     61.9  cm/s     ---------  LVOT VTI, S                               18    cm       ---------  Stroke volume (SV), LVOT DP               68.4  ml       ---------  Stroke index (SV/bsa), LVOT DP            41.1  ml/m^2   ---------    Aortic valve                              Value          Reference  Aortic valve peak velocity, S             422   cm/s     ---------  Aortic valve mean velocity, S  312   cm/s     ---------  Aortic valve VTI, S                       91    cm       ---------  Aortic mean gradient, S                   44    mm Hg    ---------  Aortic peak gradient, S                   71    mm Hg     ---------  VTI ratio, LVOT/AV                        0.2            ---------  Aortic valve area, VTI                    0.75  cm^2     ---------  Aortic valve area/bsa, VTI                0.45  cm^2/m^2 ---------  Velocity ratio, peak, LVOT/AV             0.2            ---------  Aortic valve area, peak velocity          0.78  cm^2     ---------  Aortic valve area/bsa, peak               0.47  cm^2/m^2 ---------  velocity  Velocity ratio, mean, LVOT/AV             0.2            ---------  Aortic valve area, mean velocity          0.75  cm^2     ---------  Aortic valve area/bsa, mean               0.45  cm^2/m^2 ---------  velocity    Aorta                                     Value          Reference  Aortic root ID, ED                        28    mm       ---------    Left atrium                               Value          Reference  LA ID, A-P, ES                            30    mm       ---------  LA ID/bsa, A-P                            1.8   cm/m^2   <=2.2  LA volume, S  45    ml       ---------  LA volume/bsa, S                          27    ml/m^2   ---------  LA volume, ES, 1-p A4C                    32    ml       ---------  LA volume/bsa, ES, 1-p A4C                19.2  ml/m^2   ---------  LA volume, ES, 1-p A2C                    55    ml       ---------  LA volume/bsa, ES, 1-p A2C                33    ml/m^2   ---------    Mitral valve                              Value          Reference  Mitral E-wave peak velocity               80.9  cm/s     ---------  Mitral A-wave peak velocity               135   cm/s     ---------  Mitral deceleration time                  180   ms       150 - 230  Mitral peak gradient, D                   3     mm Hg    ---------  Mitral E/A ratio, peak                    0.6            ---------    Pulmonary arteries                        Value          Reference  PA pressure, S, DP                         30    mm Hg    <=30    Tricuspid valve                           Value          Reference  Tricuspid regurg peak velocity            261   cm/s     ---------  Tricuspid peak RV-RA gradient             27    mm Hg    ---------    Systemic veins                            Value          Reference  Estimated CVP                             3     mm Hg    ---------    Right ventricle                           Value          Reference  RV pressure, S, DP                        30    mm Hg    <=30  RV s&', lateral, S                         11    cm/s     ---------  Legend: (L)  and  (H)  mark values outside specified reference range.  ------------------------------------------------------------------- Prepared and Electronically Authenticated by  Nicholes Mango, MD 2017-03-02T16:52:07   Pulmonary Function Tests  Baseline      Post-bronchodilator  FVC  1.31 L  (53% predicted) FVC  1.24 L  (50% predicted) FEV1  0.68 L  (37% predicted) FEV1  0.67 L  (37% predicted) FEF25-75 0.30 L  (24% predicted) FEF25-75 0.25 L  (20% predicted)  Results for IMMACULATE, CRUTCHER (MRN [id number]) as of 02/05/2016 17:45  Ref. Range 01/31/2016 14:28  Sample type Unknown ARTERIAL  Delivery systems Unknown ROOM AIR  FIO2 Unknown 21.00  pH, Arterial Latest Ref Range: 7.350 - 7.450  7.449  pCO2 arterial Latest Ref Range: 32.0 - 48.0 mmHg 40.2  pO2, Arterial Latest Ref Range: 83.0 - 108.0 mmHg 64.8 (L)  Acid-Base Excess Latest Ref Range: 0.0 - 2.0 mmol/L 3.6 (H)  Bicarbonate Latest Ref Range: 20.0 - 28.0 mmol/L 27.4  O2 Saturation Latest Units: % 92.0  Patient temperature Unknown 98.6  Collection site Unknown RIGHT RADIAL  Allens test (pass/fail) Latest Ref Range: PASS  PASS       Impression:  Patient has stage D severe symptomatic aortic stenosis, severe COPD, and extremely limited physical mobility with severe physical deconditioning.  Her functional status has declined precipitously over the  past year and she has never really recovered since her falls that occurred last spring.  Even prior to that she had developed rapid progression of symptoms of shortness of breath that are unquestionably related to a combination of severe COPD with worsening chronic diastolic congestive heart failure. She is now breathless at rest and oxygen dependent. Her quality of life is clearly relatively poor.  I have personally reviewed the patient's most recent transthoracic echocardiogram which was performed 04/26/2015. There was moderate to severe thickening with restricted leaflet mobility involving all 3 leaflets of the patient's aortic valve. Peak velocity across the aortic valve range between 3.8 and 4.2 m/s corresponding to mean transvalvular gradient estimated as high as 44 mmHg. Left ventricular systolic function appeared normal.  Pulmonary function tests confirmed the presence of severe COPD with baseline FEV1 less than 700 mL.   I would not consider this patient a candidate for conventional surgical aortic valve replacement under any circumstances.  Although the patient does have aortic stenosis, I am skeptical that transcatheter aortic valve replacement would provide significant improvement in the patient's current quality of life.  Her long-term prognosis is poor.   Plan:  The patient and her daughter were  counseled at length regarding treatment alternatives for management of severe symptomatic aortic stenosis. Alternative approaches such as conventional aortic valve replacement, transcatheter aortic valve replacement, and palliative medical therapy were compared and contrasted at length.  The patient does not wish to proceed with an aggressive approach to her care. I agree with her decision and would support referral to hospice to assist with her long-term management. All of their questions have been addressed.   I spent in excess of 90 minutes during the conduct of this office consultation and >50% of  this time involved direct face-to-face encounter with the patient for counseling and/or coordination of their care.   Salvatore Decent. Cornelius Moras, MD 02/05/2016 4:24 PM

## 2016-02-07 NOTE — Progress Notes (Signed)
Please have her come back in the next 2-3 weeks for followup

## 2016-02-08 ENCOUNTER — Ambulatory Visit: Payer: Commercial Managed Care - HMO | Admitting: Internal Medicine

## 2016-02-11 ENCOUNTER — Telehealth: Payer: Self-pay | Admitting: Internal Medicine

## 2016-02-11 DIAGNOSIS — F0280 Dementia in other diseases classified elsewhere without behavioral disturbance: Secondary | ICD-10-CM

## 2016-02-11 DIAGNOSIS — G3183 Dementia with Lewy bodies: Secondary | ICD-10-CM

## 2016-02-11 DIAGNOSIS — J449 Chronic obstructive pulmonary disease, unspecified: Secondary | ICD-10-CM

## 2016-02-11 NOTE — Telephone Encounter (Signed)
Daughter called in stating that patient does not qualify for surgery that was talked about on last OV.  Is requesting referral for hospice.  Daughter would like to be listed as main contact in regard to hospice.

## 2016-02-12 NOTE — Telephone Encounter (Signed)
Ok for referral?

## 2016-02-13 ENCOUNTER — Telehealth: Payer: Self-pay

## 2016-02-13 NOTE — Telephone Encounter (Signed)
-----   Message from Quintella Reichertraci R Turner, MD sent at 02/07/2016 10:02 PM EST -----   ----- Message ----- From: Purcell Nailslarence H Owen, MD Sent: 02/05/2016   5:57 PM To: Quintella Reichertraci R Turner, MD, Corwin LevinsJames W John, MD, #  Please see the attached note regarding our mutual patient from their visit in our office today.  CHO

## 2016-02-13 NOTE — Telephone Encounter (Signed)
Per Dr. Mayford Knifeurner, called patient to schedule appointment. She requested her daughter, Karina Andrews, called to arrange OV. Left message on Kristina's VM to confirm appointment scheduled 1/9 at 0930.

## 2016-02-21 NOTE — Telephone Encounter (Signed)
Just wondering if this referral has been done?

## 2016-02-22 ENCOUNTER — Telehealth: Payer: Self-pay | Admitting: Internal Medicine

## 2016-02-22 NOTE — Telephone Encounter (Signed)
Would like to clarify that Dr. Jonny RuizJohn will be attending provider for patient. States will not be getting orders from facility patient is in.

## 2016-02-22 NOTE — Telephone Encounter (Signed)
I do not normally fax orders  Orders are done on the EMR and [facility] administratively by [facility]  [facility] as this was done on the date requested

## 2016-02-22 NOTE — Telephone Encounter (Signed)
CMA's are to do hospice referrals. Can you please fax order to them. thanks

## 2016-02-22 NOTE — Telephone Encounter (Signed)
Spoke to hospice. [facility] has to do the order. Patient daughter aware

## 2016-02-22 NOTE — Telephone Encounter (Signed)
Confirmed OV with patient's daughter.

## 2016-02-22 NOTE — Telephone Encounter (Signed)
[facility] called stating [facility]osp[facility] on [address] never got the order that we fax from our office. Can we please call daughter back once we refax it today( really need it done today).   [facility] Plants: Amy Norton---rep for hospice Fax # [phone]  [facility] # [phone]

## 2016-02-25 DIAGNOSIS — M6281 Muscle weakness (generalized): Secondary | ICD-10-CM | POA: Diagnosis not present

## 2016-02-25 DIAGNOSIS — Z9181 History of falling: Secondary | ICD-10-CM | POA: Diagnosis not present

## 2016-02-25 DIAGNOSIS — J441 Chronic obstructive pulmonary disease with (acute) exacerbation: Secondary | ICD-10-CM | POA: Diagnosis not present

## 2016-02-25 DIAGNOSIS — J449 Chronic obstructive pulmonary disease, unspecified: Secondary | ICD-10-CM | POA: Diagnosis not present

## 2016-02-25 DIAGNOSIS — R0602 Shortness of breath: Secondary | ICD-10-CM | POA: Diagnosis not present

## 2016-03-04 ENCOUNTER — Ambulatory Visit: Payer: Commercial Managed Care - HMO | Admitting: Cardiology

## 2016-03-06 ENCOUNTER — Ambulatory Visit: Payer: Commercial Managed Care - HMO | Admitting: Internal Medicine

## 2016-03-07 ENCOUNTER — Ambulatory Visit (INDEPENDENT_AMBULATORY_CARE_PROVIDER_SITE_OTHER): Payer: Medicare HMO | Admitting: Internal Medicine

## 2016-03-07 ENCOUNTER — Other Ambulatory Visit (INDEPENDENT_AMBULATORY_CARE_PROVIDER_SITE_OTHER)

## 2016-03-07 ENCOUNTER — Encounter (HOSPITAL_COMMUNITY): Payer: Self-pay

## 2016-03-07 ENCOUNTER — Encounter: Payer: Self-pay | Admitting: Internal Medicine

## 2016-03-07 VITALS — BP 126/60 | HR 111

## 2016-03-07 DIAGNOSIS — J449 Chronic obstructive pulmonary disease, unspecified: Secondary | ICD-10-CM

## 2016-03-07 DIAGNOSIS — I10 Essential (primary) hypertension: Secondary | ICD-10-CM | POA: Diagnosis present

## 2016-03-07 DIAGNOSIS — E785 Hyperlipidemia, unspecified: Secondary | ICD-10-CM | POA: Diagnosis present

## 2016-03-07 DIAGNOSIS — Z9841 Cataract extraction status, right eye: Secondary | ICD-10-CM

## 2016-03-07 DIAGNOSIS — K922 Gastrointestinal hemorrhage, unspecified: Principal | ICD-10-CM | POA: Diagnosis present

## 2016-03-07 DIAGNOSIS — M810 Age-related osteoporosis without current pathological fracture: Secondary | ICD-10-CM | POA: Diagnosis present

## 2016-03-07 DIAGNOSIS — D62 Acute posthemorrhagic anemia: Secondary | ICD-10-CM | POA: Diagnosis present

## 2016-03-07 DIAGNOSIS — G309 Alzheimer's disease, unspecified: Secondary | ICD-10-CM | POA: Diagnosis present

## 2016-03-07 DIAGNOSIS — Z9842 Cataract extraction status, left eye: Secondary | ICD-10-CM

## 2016-03-07 DIAGNOSIS — D500 Iron deficiency anemia secondary to blood loss (chronic): Secondary | ICD-10-CM

## 2016-03-07 DIAGNOSIS — Z8249 Family history of ischemic heart disease and other diseases of the circulatory system: Secondary | ICD-10-CM | POA: Diagnosis not present

## 2016-03-07 DIAGNOSIS — Z961 Presence of intraocular lens: Secondary | ICD-10-CM | POA: Diagnosis present

## 2016-03-07 DIAGNOSIS — Z66 Do not resuscitate: Secondary | ICD-10-CM | POA: Diagnosis present

## 2016-03-07 DIAGNOSIS — Z79899 Other long term (current) drug therapy: Secondary | ICD-10-CM

## 2016-03-07 DIAGNOSIS — Z9049 Acquired absence of other specified parts of digestive tract: Secondary | ICD-10-CM

## 2016-03-07 DIAGNOSIS — F0280 Dementia in other diseases classified elsewhere without behavioral disturbance: Secondary | ICD-10-CM | POA: Diagnosis present

## 2016-03-07 DIAGNOSIS — Z7951 Long term (current) use of inhaled steroids: Secondary | ICD-10-CM

## 2016-03-07 DIAGNOSIS — F329 Major depressive disorder, single episode, unspecified: Secondary | ICD-10-CM | POA: Diagnosis present

## 2016-03-07 DIAGNOSIS — R0600 Dyspnea, unspecified: Secondary | ICD-10-CM | POA: Diagnosis not present

## 2016-03-07 DIAGNOSIS — I350 Nonrheumatic aortic (valve) stenosis: Secondary | ICD-10-CM

## 2016-03-07 DIAGNOSIS — J9611 Chronic respiratory failure with hypoxia: Secondary | ICD-10-CM | POA: Diagnosis present

## 2016-03-07 DIAGNOSIS — D509 Iron deficiency anemia, unspecified: Secondary | ICD-10-CM | POA: Diagnosis present

## 2016-03-07 DIAGNOSIS — Z7982 Long term (current) use of aspirin: Secondary | ICD-10-CM

## 2016-03-07 DIAGNOSIS — Z9981 Dependence on supplemental oxygen: Secondary | ICD-10-CM | POA: Diagnosis not present

## 2016-03-07 DIAGNOSIS — Z515 Encounter for palliative care: Secondary | ICD-10-CM | POA: Diagnosis present

## 2016-03-07 DIAGNOSIS — F419 Anxiety disorder, unspecified: Secondary | ICD-10-CM | POA: Diagnosis present

## 2016-03-07 DIAGNOSIS — F17210 Nicotine dependence, cigarettes, uncomplicated: Secondary | ICD-10-CM | POA: Diagnosis present

## 2016-03-07 DIAGNOSIS — K219 Gastro-esophageal reflux disease without esophagitis: Secondary | ICD-10-CM | POA: Diagnosis not present

## 2016-03-07 DIAGNOSIS — Z888 Allergy status to other drugs, medicaments and biological substances status: Secondary | ICD-10-CM

## 2016-03-07 DIAGNOSIS — R531 Weakness: Secondary | ICD-10-CM | POA: Diagnosis not present

## 2016-03-07 DIAGNOSIS — Z993 Dependence on wheelchair: Secondary | ICD-10-CM | POA: Diagnosis not present

## 2016-03-07 DIAGNOSIS — R030 Elevated blood-pressure reading, without diagnosis of hypertension: Secondary | ICD-10-CM | POA: Diagnosis not present

## 2016-03-07 DIAGNOSIS — D649 Anemia, unspecified: Secondary | ICD-10-CM | POA: Diagnosis present

## 2016-03-07 LAB — COMPREHENSIVE METABOLIC PANEL
ALK PHOS: 127 U/L — AB (ref 38–126)
ALT: 61 U/L — ABNORMAL HIGH (ref 14–54)
ANION GAP: 10 (ref 5–15)
AST: 38 U/L (ref 15–41)
Albumin: 3.1 g/dL — ABNORMAL LOW (ref 3.5–5.0)
BUN: 20 mg/dL (ref 6–20)
CALCIUM: 9 mg/dL (ref 8.9–10.3)
CO2: 24 mmol/L (ref 22–32)
Chloride: 105 mmol/L (ref 101–111)
Creatinine, Ser: 0.7 mg/dL (ref 0.44–1.00)
GFR calc non Af Amer: >60 mL/min (ref >60)
Glucose, Bld: 109 mg/dL — ABNORMAL HIGH (ref 65–99)
Potassium: 4.1 mmol/L (ref 3.5–5.1)
SODIUM: 139 mmol/L (ref 135–145)
TOTAL PROTEIN: 6.5 g/dL (ref 6.5–8.1)
Total Bilirubin: 0.7 mg/dL (ref 0.3–1.2)

## 2016-03-07 LAB — CBC WITH DIFFERENTIAL/PLATELET
Basophils Absolute: 0 10*3/uL (ref 0.0–0.1)
Basophils Relative: 0.4 % (ref 0.0–3.0)
EOS ABS: 0.1 10*3/uL (ref 0.0–0.7)
Eosinophils Relative: 1.4 % (ref 0.0–5.0)
HCT: 18.6 % — CL (ref 36.0–46.0)
Hemoglobin: 5.9 g/dL — CL (ref 12.0–15.0)
LYMPHS ABS: 1 10*3/uL (ref 0.7–4.0)
Lymphocytes Relative: 11.2 % — ABNORMAL LOW (ref 12.0–46.0)
MCHC: 31.8 g/dL (ref 30.0–36.0)
MCV: 82.6 fl (ref 78.0–100.0)
MONO ABS: 0.5 10*3/uL (ref 0.1–1.0)
Monocytes Relative: 5.7 % (ref 3.0–12.0)
NEUTROS ABS: 7.2 10*3/uL (ref 1.4–7.7)
NEUTROS PCT: 81.3 % — AB (ref 43.0–77.0)
PLATELETS: 343 10*3/uL (ref 150.0–400.0)
RBC: 2.25 Mil/uL — ABNORMAL LOW (ref 3.87–5.11)
RDW: 22.2 % — AB (ref 11.5–15.5)
WBC: 8.8 10*3/uL (ref 4.0–10.5)

## 2016-03-07 LAB — BASIC METABOLIC PANEL
BUN: 22 mg/dL (ref 6–23)
CHLORIDE: 108 meq/L (ref 96–112)
CO2: 28 mEq/L (ref 19–32)
Calcium: 8.8 mg/dL (ref 8.4–10.5)
Creatinine, Ser: 0.63 mg/dL (ref 0.40–1.20)
GFR: 95.71 mL/min (ref >60.00)
Glucose, Bld: 113 mg/dL — ABNORMAL HIGH (ref 70–99)
POTASSIUM: 4.8 meq/L (ref 3.5–5.1)
Sodium: 142 mEq/L (ref 135–145)

## 2016-03-07 LAB — IBC PANEL
IRON: 7 ug/dL — AB (ref 42–145)
Saturation Ratios: 1.4 % — ABNORMAL LOW (ref 20.0–50.0)
TRANSFERRIN: 366 mg/dL — AB (ref 212.0–360.0)

## 2016-03-07 LAB — CBC
HCT: 20.1 % — ABNORMAL LOW (ref 36.0–46.0)
HEMOGLOBIN: 6 g/dL — AB (ref 12.0–15.0)
MCH: 25.6 pg — ABNORMAL LOW (ref 26.0–34.0)
MCHC: 29.9 g/dL — ABNORMAL LOW (ref 30.0–36.0)
MCV: 85.9 fL (ref 78.0–100.0)
Platelets: 370 10*3/uL (ref 150–400)
RBC: 2.34 MIL/uL — AB (ref 3.87–5.11)
RDW: 20.8 % — ABNORMAL HIGH (ref 11.5–15.5)
WBC: 8.9 10*3/uL (ref 4.0–10.5)

## 2016-03-07 LAB — BRAIN NATRIURETIC PEPTIDE: Pro B Natriuretic peptide (BNP): 60 pg/mL (ref 0.0–100.0)

## 2016-03-07 NOTE — Progress Notes (Signed)
Subjective:   Patient ID: Karina Andrews, female    DOB: [birth year]     MRN: [id number]    Brief patient profile:  85 yowf  quit smoker 08/2015  with GOLD I copd 07/2008 maint on symbicort 160 2 bid and prn albuterol with increase doe x Jan 2017 > CT c/w mpn's so referred to pulmonary clinic 05/04/2015 by Dr Oliver Barre     History of Present Illness  05/04/2015 1st [facility] office visit/ Mayrene Bastarache   Chief Complaint  Patient presents with  . Pulmonary Consult    Referred by Dr. Oliver Barre. Pt c/o SOB x 6 wks. She gets SOB with talking and when she goes shopping.   baseline able to do some grocery shopping then gradually more sob just with room to room walking, not at rest or sleeping, minimal am cough /congestion no change in baseline other than the doe.  No assoc ex cp or pressyncope rec Plan A = Automatic = Symbicort 160 Take 2 puffs first thing in am and then another 2 puffs about 12 hours later.  Work on inhaler technique:   Plan B = Backup Only use your albuterol as a rescue medication Your heart valve appears to be your limiting problem at present  the key is to stop smoking completely before smoking completely stops you!  We will see you in 3 months to follow up on the lung nodules but these are the least of your problems   08/03/2015  f/u ov/Lexxi Koslow re: GOLD I / still smoking/ only on symbicort and no need for spiriva/saba Chief Complaint  Patient presents with  . Follow-up  sleeping ok no 02 s am cough congestion Mostly in w/c but able to walk in home with w/c very slowly acoss room  Doe = MMRC4  = sob if tries to leave home or while getting dressed  Turned down for TAVR rec Ok to leave off spiriva  Please remember to go to the x-ray department downstairs for your tests - we will call you with the results when they are available.    11/09/2015  f/u ov/Leocadia Idleman re: COPD GOLD I/ last smoked September 10 2015 / rx maint symbicort no change off spiriva  Chief Complaint  Patient  presents with  . Follow-up    11mo rov. pt had CXR today. pt c/o sob w/exertion & prod cough with yellow mucus.  continues MMRC 4 level sob and speaking in on - two word phrases  rec No change rx   03/07/2016  f/u ov/Toiya Morrish re: COPD /  maint symb and prn saba hfa/ duoneb  Chief Complaint  Patient presents with  . Follow-up    Pt c/o increased fatigue and SOB for the past 2 wks. She has had a cold.     onset progressive x 2 weeks sob and fatigue  Sleeps at 30 degrees/ no am flares of cough or wheeze  / not usually needing rescue     No obvious day to day or daytime variability or assoc excess/ purulent sputum or mucus plugs or hemoptysis or cp or chest tightness, subjective wheeze or overt sinus or hb symptoms. No unusual exp hx or h/o childhood pna/ asthma or knowledge of premature birth.  Sleeping ok without nocturnal  or early am exacerbation  of respiratory  c/o's or need for noct saba. Also denies any obvious fluctuation of symptoms with weather or environmental changes or other aggravating or alleviating factors except as outlined above  Current Medications, Allergies, Complete Past Medical History, Past Surgical History, Family History, and Social History were reviewed in [employer] record.  ROS  The following are not active complaints unless bolded sore throat, dysphagia, dental problems, itching, sneezing,  nasal congestion or excess/ purulent secretions, ear ache,   fever, chills, sweats, unintended wt loss, classically pleuritic or exertional cp,  orthopnea pnd or leg swelling, presyncope, palpitations, abdominal pain, anorexia, nausea, vomiting, diarrhea  or change in bowel or bladder habits, change in stools or urine, dysuria,hematuria,  rash, arthralgias, visual complaints, headache, numbness, weakness or ataxia or problems with walking or coordination and falls s walker,  change in mood/affect or memory.          Objective:   Physical Exam  Frail  elderly wf nad halting one-two  word speech   03/07/2016       Did not weigh  08/03/15 124 lb (56.246 kg)  06/27/15 135 lb (61.236 kg)  06/21/15 131 lb 12.8 oz (59.784 kg)    Vital signs reviewed / sats 95% on 2lpm     HEENT: nl  turbinates, and oropharynx. Nl external ear canals without cough reflex - upper and lower dentures    NECK :  without JVD/Nodes/TM/ nl carotid upstrokes bilaterally   LUNGS: no acc muscle use,  slt barrel contour chest with distant bs bilaterally/ R > L insp crackles but no wheezing/  without cough on insp or exp maneuvers   CV:  RRR  no s3  - GIII/VI  SEM with decrease S2 but no increase in P2, no edema   ABD:  soft and nontender with nl inspiratory excursion in the supine position. No bruits or organomegaly, bowel sounds nl  MS:  Nl gait/ ext warm without deformities, calf tenderness, cyanosis or clubbing No obvious joint restrictions   SKIN: warm and dry without lesions    NEURO:  alert, approp, nl sensorium with  no motor deficits        CXR PA and Lateral:   11/09/2015 :    I personally reviewed images and agree with radiology impression as follows:   Stable emphysematous change. Heart size and vascular pattern normal. Mild to moderate diffuse interstitial prominence stable. Deformities from previous right rib fractures stable. 1 cm nodular opacity right upper lobe and lateral right lower lobe new from prior study. No pleural effusion appear     Labs ordered/ reviewed:      Chemistry      Component Value Date/Time   NA 142 03/07/2016 1611   NA 144 06/11/2015   K 4.8 03/07/2016 1611   CL 108 03/07/2016 1611   CO2 28 03/07/2016 1611   BUN 22 03/07/2016 1611   BUN 19 06/11/2015   CREATININE 0.63 03/07/2016 1611   CREATININE 0.67 01/01/2016 1446   GLU 131 06/11/2015      Component Value Date/Time   CALCIUM 8.8 03/07/2016 1611   ALKPHOS 88 01/01/2016 1446   AST 16 01/01/2016 1446   ALT 11 01/01/2016 1446   BILITOT 0.2  01/01/2016 1446        Lab Results  Component Value Date   WBC 8.8 03/07/2016   HGB 5.9 Repeated and verified X2. (LL) 03/07/2016   HCT 18.6 Repeated and verified X2. (LL) 03/07/2016   MCV 82.6 03/07/2016   PLT 343.0 03/07/2016     No results found for: DDIMER    Lab Results  Component Value Date   TSH 2.01 01/01/2016  Lab Results  Component Value Date   PROBNP 60.0 03/07/2016           Assessment & Plan:

## 2016-03-07 NOTE — Assessment & Plan Note (Addendum)
Severely debilitated and now also recurrent anemia - surprising she's not more symptomatic since critical AS limits CO response to low hgb > advised to go to ER now for eval/ transfusion   I had an extended discussion with the patient and daughter reviewing all relevant studies completed to date and  lasting 15 to 20 minutes of a 25 minute visit    Discussed possibility of transition of purely palliative/ hospice approach > not clear they are ready to accept this but really nothing else to offer as I think we are rapidly approaching the limits of medical science and she is getting progressively debilitated  Each maintenance medication was reviewed in detail including most importantly the difference between maintenance and prns and under what circumstances the prns are to be triggered using an action plan format that is not reflected in the computer generated alphabetically organized AVS.    Please see AVS for specific instructions unique to this visit that I personally wrote and verbalized to the the pt in detail and then reviewed with pt  by my nurse highlighting any  changes in therapy recommended at today's visit to their plan of care.

## 2016-03-07 NOTE — Assessment & Plan Note (Addendum)
PFT's  03/18/08   FEV1 1.65 (88 % ) ratio 52  p 0 % improvement from saba with DLCO  71 % corrects to 83  % for alv volume   - Spirometry 05/04/2015  Not reproducible, f/v loop not physiolgic - d/c spiriva 08/03/2015 (not taking it anyway)  - quit smoking 08/2015   - The proper method of use, as well as anticipated side effects, of a metered-dose inhaler are discussed and demonstrated to the patient. Improved effectiveness after extensive coaching during this visit to a level of approximately 75 % from a baseline of 50 % > continue symbicort   No evidence of copd exac/ relatively well compensated despite poor hfa./ no change in rx needed

## 2016-03-07 NOTE — Assessment & Plan Note (Signed)
Echo 09/30/13: Left ventricle: The cavity size was normal. Systolic function was vigorous. The estimated ejection fraction was in the range of 65% to 70%. Wall motion was normal; there were no regional wall motion abnormalities. - Aortic valve: Mildly to moderately calcified annulus. Trileaflet; mildly thickened, mildly calcified leaflets. Cusp separation was reduced. Transvalvular velocity was abnormal, due to stenosis. There was moderate stenosis. Peak velocity (S): 333 cm/s. Mean gradient (S): 28 mm Hg. Valve area (VTI): 1.56 cm^2. Valve area (Vmax): 1.36 cm^2. - Mitral valve: Calcified annulus. ECHO  04/26/15 Left ventricle: The cavity size was normal. Wall thickness was  normal. Systolic function was normal. The estimated ejection  fraction was in the range of 60% to 65%. Wall motion was normal;  there were no regional wall motion abnormalities. Doppler  parameters are consistent with abnormal left ventricular  relaxation (grade 1 diastolic dysfunction). Doppler parameters  are consistent with high ventricular filling pressure. - Aortic valve: There was severe stenosis. There was trivial  regurgitation. Mean gradient (S): 44 mm Hg. Peak gradient (S): 71  mm Hg. Valve area (VTI): 0.75 cm^2. Valve area (Vmax): 0.78 cm^2.  Valve area (Vmean): 0.75 cm^2. - Mitral valve: Calcified annulus.>>> turned down for intervention by Drs Turner/Cooper/Owens    Dr Barry Dieneswens does not feel she's an op candidate > no medical rx options

## 2016-03-07 NOTE — Assessment & Plan Note (Signed)
  Lab Results  Component Value Date   HGB 5.9 Repeated and verified X2. (LL) 03/07/2016   HGB 10.1 (L) 01/09/2016   HGB 9.1 (L) 01/03/2016     Will need to go to ER for transfusion / likely GIB ? Related to AVM's but down candidate for aggressive w/u > check fe studies pending

## 2016-03-07 NOTE — Patient Instructions (Addendum)
Please remember to go to the lab department downstairs for your tests - we will call you with the results when they are available.  Plan A = Automatic = symbicort 160 Take 2 puffs first thing in am and then another 2 puffs about 12 hours later.    Work on inhaler technique:  relax and gently blow all the way out then take a nice smooth deep breath back in, triggering the inhaler at same time you start breathing in.  Hold for up to 5 seconds if you can. Blow out thru nose. Rinse and gargle with water when done     Plan B = Backup Only use your albuterol (ventolin) as a rescue medication to be used if you can't catch your breath by resting or doing a relaxed purse lip breathing pattern.  - The less you use it, the better it will work when you need it. - Ok to use the inhaler up to 2 puffs  every 4 hours if you must but call for appointment if use goes up over your usual need - Don't leave home without it !!  (think of it like the spare tire for your car)   Plan C = Crisis - only use your duoneb nebulizer if you first try Plan B and it fails to help > ok to use the nebulizer up to every 4 hours but if start needing it regularly call for immediate appointment   Please remember to go to the lab  department downstairs for your tests - we will call you with the results when they are available.  Pulmonary follow up is as needed

## 2016-03-07 NOTE — ED Triage Notes (Signed)
[facility] EMS[facility] for a hemoglobin of 5, hx of anemia and takes iron pills. She does report black stools but unsure amount of time. Denies feeling bad.

## 2016-03-08 ENCOUNTER — Inpatient Hospital Stay (HOSPITAL_COMMUNITY)
Admission: EM | Admit: 2016-03-08 | Discharge: 2016-03-09 | DRG: 378 | Disposition: A | Discharge status: Nursing Home (Includes ICF) | Attending: Internal Medicine | Admitting: Internal Medicine

## 2016-03-08 DIAGNOSIS — E785 Hyperlipidemia, unspecified: Secondary | ICD-10-CM | POA: Diagnosis present

## 2016-03-08 DIAGNOSIS — D509 Iron deficiency anemia, unspecified: Secondary | ICD-10-CM | POA: Diagnosis present

## 2016-03-08 DIAGNOSIS — I350 Nonrheumatic aortic (valve) stenosis: Secondary | ICD-10-CM

## 2016-03-08 DIAGNOSIS — K219 Gastro-esophageal reflux disease without esophagitis: Secondary | ICD-10-CM | POA: Diagnosis present

## 2016-03-08 DIAGNOSIS — Z9981 Dependence on supplemental oxygen: Secondary | ICD-10-CM | POA: Diagnosis not present

## 2016-03-08 DIAGNOSIS — J9611 Chronic respiratory failure with hypoxia: Secondary | ICD-10-CM | POA: Diagnosis present

## 2016-03-08 DIAGNOSIS — D62 Acute posthemorrhagic anemia: Secondary | ICD-10-CM | POA: Diagnosis present

## 2016-03-08 DIAGNOSIS — I10 Essential (primary) hypertension: Secondary | ICD-10-CM | POA: Diagnosis present

## 2016-03-08 DIAGNOSIS — F0280 Dementia in other diseases classified elsewhere without behavioral disturbance: Secondary | ICD-10-CM | POA: Diagnosis present

## 2016-03-08 DIAGNOSIS — F17210 Nicotine dependence, cigarettes, uncomplicated: Secondary | ICD-10-CM | POA: Diagnosis present

## 2016-03-08 DIAGNOSIS — D649 Anemia, unspecified: Secondary | ICD-10-CM | POA: Diagnosis present

## 2016-03-08 DIAGNOSIS — J449 Chronic obstructive pulmonary disease, unspecified: Secondary | ICD-10-CM | POA: Diagnosis present

## 2016-03-08 DIAGNOSIS — Z8249 Family history of ischemic heart disease and other diseases of the circulatory system: Secondary | ICD-10-CM | POA: Diagnosis not present

## 2016-03-08 DIAGNOSIS — Z9049 Acquired absence of other specified parts of digestive tract: Secondary | ICD-10-CM | POA: Diagnosis not present

## 2016-03-08 DIAGNOSIS — G309 Alzheimer's disease, unspecified: Secondary | ICD-10-CM | POA: Diagnosis present

## 2016-03-08 DIAGNOSIS — Z9841 Cataract extraction status, right eye: Secondary | ICD-10-CM | POA: Diagnosis not present

## 2016-03-08 DIAGNOSIS — Z515 Encounter for palliative care: Secondary | ICD-10-CM | POA: Diagnosis present

## 2016-03-08 DIAGNOSIS — K922 Gastrointestinal hemorrhage, unspecified: Secondary | ICD-10-CM | POA: Diagnosis present

## 2016-03-08 DIAGNOSIS — Z9842 Cataract extraction status, left eye: Secondary | ICD-10-CM | POA: Diagnosis not present

## 2016-03-08 DIAGNOSIS — M810 Age-related osteoporosis without current pathological fracture: Secondary | ICD-10-CM | POA: Diagnosis present

## 2016-03-08 DIAGNOSIS — F419 Anxiety disorder, unspecified: Secondary | ICD-10-CM | POA: Diagnosis present

## 2016-03-08 DIAGNOSIS — F329 Major depressive disorder, single episode, unspecified: Secondary | ICD-10-CM | POA: Diagnosis present

## 2016-03-08 DIAGNOSIS — Z66 Do not resuscitate: Secondary | ICD-10-CM | POA: Diagnosis present

## 2016-03-08 DIAGNOSIS — Z993 Dependence on wheelchair: Secondary | ICD-10-CM | POA: Diagnosis not present

## 2016-03-08 DIAGNOSIS — Z961 Presence of intraocular lens: Secondary | ICD-10-CM | POA: Diagnosis present

## 2016-03-08 LAB — POC OCCULT BLOOD, ED: Fecal Occult Bld: POSITIVE — AB

## 2016-03-08 LAB — HEPATIC FUNCTION PANEL
ALK PHOS: 119 U/L (ref 38–126)
ALT: 57 U/L — ABNORMAL HIGH (ref 14–54)
AST: 39 U/L (ref 15–41)
Albumin: 2.7 g/dL — ABNORMAL LOW (ref 3.5–5.0)
Total Bilirubin: 0.5 mg/dL (ref 0.3–1.2)
Total Protein: 5.9 g/dL — ABNORMAL LOW (ref 6.5–8.1)

## 2016-03-08 LAB — BRAIN NATRIURETIC PEPTIDE: B NATRIURETIC PEPTIDE 5: 62.6 pg/mL (ref 0.0–100.0)

## 2016-03-08 LAB — BASIC METABOLIC PANEL
ANION GAP: 7 (ref 5–15)
BUN: 16 mg/dL (ref 6–20)
CO2: 26 mmol/L (ref 22–32)
Calcium: 8.6 mg/dL — ABNORMAL LOW (ref 8.9–10.3)
Chloride: 106 mmol/L (ref 101–111)
Creatinine, Ser: 0.67 mg/dL (ref 0.44–1.00)
Glucose, Bld: 101 mg/dL — ABNORMAL HIGH (ref 65–99)
POTASSIUM: 3.8 mmol/L (ref 3.5–5.1)
SODIUM: 139 mmol/L (ref 135–145)

## 2016-03-08 LAB — CBC
HCT: 18.5 % — ABNORMAL LOW (ref 36.0–46.0)
Hemoglobin: 5.5 g/dL — CL (ref 12.0–15.0)
MCH: 25.7 pg — AB (ref 26.0–34.0)
MCHC: 29.7 g/dL — ABNORMAL LOW (ref 30.0–36.0)
MCV: 86.4 fL (ref 78.0–100.0)
PLATELETS: 299 10*3/uL (ref 150–400)
RBC: 2.14 MIL/uL — AB (ref 3.87–5.11)
RDW: 21.1 % — ABNORMAL HIGH (ref 11.5–15.5)
WBC: 6.4 10*3/uL (ref 4.0–10.5)

## 2016-03-08 LAB — APTT: aPTT: 28 seconds (ref 24–36)

## 2016-03-08 LAB — PROTIME-INR
INR: 1.08
PROTHROMBIN TIME: 14.1 s (ref 11.4–15.2)

## 2016-03-08 LAB — LACTATE DEHYDROGENASE: LDH: 152 U/L (ref 98–192)

## 2016-03-08 LAB — GLUCOSE, CAPILLARY: GLUCOSE-CAPILLARY: 91 mg/dL (ref 65–99)

## 2016-03-08 LAB — PREPARE RBC (CROSSMATCH)

## 2016-03-08 MED ORDER — SODIUM CHLORIDE 0.9 % IV SOLN: INTRAVENOUS | Status: DC

## 2016-03-08 MED ORDER — FERROUS SULFATE 325 (65 FE) MG PO TABS
325.0000 mg | ORAL_TABLET | Freq: Every day | ORAL | Status: DC
  Administered 2016-03-08: 325 mg via ORAL
  Filled 2016-03-08: qty 1

## 2016-03-08 MED ORDER — HYDROXYZINE HCL 25 MG PO TABS
25.0000 mg | ORAL_TABLET | Freq: Three times a day (TID) | ORAL | Status: DC | PRN
  Administered 2016-03-08 – 2016-03-09 (×3): 25 mg via ORAL
  Filled 2016-03-08 (×3): qty 1

## 2016-03-08 MED ORDER — SODIUM CHLORIDE 0.9% FLUSH
3.0000 mL | Freq: Two times a day (BID) | INTRAVENOUS | Status: DC
  Administered 2016-03-08 – 2016-03-09 (×2): 3 mL via INTRAVENOUS

## 2016-03-08 MED ORDER — ACETAMINOPHEN 500 MG PO TABS
500.0000 mg | ORAL_TABLET | Freq: Three times a day (TID) | ORAL | Status: DC | PRN
  Administered 2016-03-08: 500 mg via ORAL
  Filled 2016-03-08 (×2): qty 1

## 2016-03-08 MED ORDER — GUAIFENESIN 100 MG/5ML PO SOLN
200.0000 mg | Freq: Three times a day (TID) | ORAL | Status: DC | PRN
  Filled 2016-03-08: qty 10

## 2016-03-08 MED ORDER — POLYETHYLENE GLYCOL 3350 17 G PO PACK: 17.0000 g | PACK | Freq: Every day | ORAL | Status: DC | PRN

## 2016-03-08 MED ORDER — HYDROXYZINE HCL 25 MG PO TABS
25.0000 mg | ORAL_TABLET | Freq: Once | ORAL | Status: AC
  Administered 2016-03-08: 25 mg via ORAL
  Filled 2016-03-08: qty 1

## 2016-03-08 MED ORDER — MOMETASONE FURO-FORMOTEROL FUM 200-5 MCG/ACT IN AERO
2.0000 | INHALATION_SPRAY | Freq: Two times a day (BID) | RESPIRATORY_TRACT | Status: DC
  Administered 2016-03-08 – 2016-03-09 (×2): 2 via RESPIRATORY_TRACT
  Filled 2016-03-08: qty 8.8

## 2016-03-08 MED ORDER — PANTOPRAZOLE SODIUM 40 MG IV SOLR
40.0000 mg | Freq: Two times a day (BID) | INTRAVENOUS | Status: DC
  Administered 2016-03-08 – 2016-03-09 (×3): 40 mg via INTRAVENOUS
  Filled 2016-03-08 (×4): qty 40

## 2016-03-08 MED ORDER — ZOLPIDEM TARTRATE 5 MG PO TABS
5.0000 mg | ORAL_TABLET | Freq: Every evening | ORAL | Status: DC | PRN
  Administered 2016-03-08: 5 mg via ORAL
  Filled 2016-03-08: qty 1

## 2016-03-08 MED ORDER — LOSARTAN POTASSIUM 50 MG PO TABS
50.0000 mg | ORAL_TABLET | Freq: Every day | ORAL | Status: DC
  Administered 2016-03-08 – 2016-03-09 (×2): 50 mg via ORAL
  Filled 2016-03-08 (×2): qty 1

## 2016-03-08 MED ORDER — ONDANSETRON HCL 4 MG/2ML IJ SOLN: 4.0000 mg | Freq: Four times a day (QID) | INTRAMUSCULAR | Status: DC | PRN

## 2016-03-08 MED ORDER — IPRATROPIUM-ALBUTEROL 0.5-2.5 (3) MG/3ML IN SOLN
3.0000 mL | Freq: Three times a day (TID) | RESPIRATORY_TRACT | Status: DC
  Administered 2016-03-08 (×2): 3 mL via RESPIRATORY_TRACT
  Filled 2016-03-08 (×2): qty 3

## 2016-03-08 MED ORDER — SODIUM CHLORIDE 0.9 % IV SOLN: 10.0000 mL/h | Freq: Once | INTRAVENOUS | Status: DC

## 2016-03-08 MED ORDER — SODIUM CHLORIDE 0.9 % IV BOLUS (SEPSIS)
500.0000 mL | Freq: Once | INTRAVENOUS | Status: AC
  Administered 2016-03-08: 500 mL via INTRAVENOUS

## 2016-03-08 MED ORDER — IPRATROPIUM-ALBUTEROL 0.5-2.5 (3) MG/3ML IN SOLN: 3.0000 mL | Freq: Four times a day (QID) | RESPIRATORY_TRACT | Status: DC | PRN

## 2016-03-08 MED ORDER — ONDANSETRON HCL 4 MG PO TABS: 4.0000 mg | ORAL_TABLET | Freq: Four times a day (QID) | ORAL | Status: DC | PRN

## 2016-03-08 MED ORDER — HYDRALAZINE HCL 20 MG/ML IJ SOLN: 5.0000 mg | INTRAMUSCULAR | Status: DC | PRN

## 2016-03-08 MED ORDER — ALBUTEROL SULFATE (2.5 MG/3ML) 0.083% IN NEBU
2.5000 mg | INHALATION_SOLUTION | RESPIRATORY_TRACT | Status: DC | PRN
  Administered 2016-03-08: 2.5 mg via RESPIRATORY_TRACT
  Filled 2016-03-08: qty 3

## 2016-03-08 MED ORDER — NICOTINE 21 MG/24HR TD PT24
21.0000 mg | MEDICATED_PATCH | Freq: Every day | TRANSDERMAL | Status: DC
  Administered 2016-03-08 – 2016-03-09 (×2): 21 mg via TRANSDERMAL
  Filled 2016-03-08 (×2): qty 1

## 2016-03-08 MED ORDER — ROSUVASTATIN CALCIUM 10 MG PO TABS
10.0000 mg | ORAL_TABLET | Freq: Every day | ORAL | Status: DC
  Administered 2016-03-08 – 2016-03-09 (×2): 10 mg via ORAL
  Filled 2016-03-08 (×2): qty 1

## 2016-03-08 MED ORDER — ALBUTEROL SULFATE (2.5 MG/3ML) 0.083% IN NEBU: 2.5000 mg | INHALATION_SOLUTION | RESPIRATORY_TRACT | Status: DC | PRN

## 2016-03-08 NOTE — ED Provider Notes (Signed)
By signing my name below, I, Teofilo Pod, attest that this documentation has been prepared under the direction and in the presence of Jhonathan Desroches N Aurelio Mccamy, DO . Electronically Signed: Teofilo Pod, ED Scribe. 03/08/2016. 2:25 AM.   TIME SEEN: 2:19 AM   CHIEF COMPLAINT:  Chief Complaint  Patient presents with  . Abnormal Lab     HPI:  HPI Comments:  Karina Andrews is a 83 y.o. female with PMHx of anemia, Hypertension, speech impediment and dementia who presents to the Emergency Department, here due to an abnormal lab. Pt was sent to the ED because she had a hemoglobin level of 5. Pt has been taking iron tablets for 7 weeks. Pt complains of associated melena for unknown period of time and SOB. Does feel "scared" and lightheaded. Also feels tired and weak.  Pt had a blood transfusion 7 weeks ago and was admitted to the hospital. Pt has no other complains at this time. No hematochezia or vaginal bleeding. No chest pain. She is receiving palliative care treatment recently but they would want transfusion, admission. Not sure if they would want endoscopy or colonoscopy if needed.   ROS: See HPI Constitutional: no fever  Eyes: no drainage  ENT: no runny nose   Cardiovascular:  no chest pain  Resp: Positive for SOB GI: no vomiting GU: Positive for melena; no dysuria Integumentary: no rash  Allergy: no hives  Musculoskeletal: no leg swelling  Neurological: no slurred speech ROS otherwise negative  PAST MEDICAL HISTORY/PAST SURGICAL HISTORY:  Past Medical History:  Diagnosis Date  . Alcohol abuse   . Anemia   . Anxiety   . Aortic stenosis    moderate on echo 09/2013  . Cervical spondylosis with myelopathy 01/2009   s/p decompression  . COPD (chronic obstructive pulmonary disease) (HCC)   . Dementia 09/06/2015  . Depression   . Fall at home 05/2015  . Heart murmur   . History of blood transfusion 01/02/2016   "anemia"  . Hypertension   . On home oxygen therapy    "2L;  24/7" (01/02/2016)  . Osteoporosis    (R) hip fx 11/2009 & (L) hip fx 2006  . Speech impediment    present for >9 months; no one has really been able to discern an etiology/daughter/notes 01/01/2016    MEDICATIONS:  Prior to Admission medications   Medication Sig Start Date End Date Taking? Authorizing Provider  acetaminophen (TYLENOL) 500 MG tablet Take 500 mg by mouth every 8 (eight) hours as needed.    Historical Provider, MD  albuterol (VENTOLIN HFA) 108 (90 Base) MCG/ACT inhaler Inhale 2 puffs into the lungs every 6 (six) hours as needed for wheezing or shortness of breath. 08/22/15   Corwin Levins, MD  aspirin EC 81 MG tablet Take 1 tablet (81 mg total) by mouth daily. 08/22/15   Corwin Levins, MD  benzonatate (TESSALON) 100 MG capsule Take 1 capsule by mouth 2 (two) times daily. 02/23/16   Historical Provider, MD  budesonide-formoterol (SYMBICORT) 160-4.5 MCG/ACT inhaler Inhale 2 puffs into the lungs 2 (two) times daily. 11/14/15   Nyoka Cowden, MD  ferrous sulfate (FERROUSUL) 325 (65 FE) MG tablet Take 1 tablet (325 mg total) by mouth at bedtime. 01/03/16   Clydia Llano, MD  furosemide (LASIX) 20 MG tablet Take 1 tablet (20 mg total) by mouth daily. 01/01/16 03/31/16  Rosalio Macadamia, NP  hydrOXYzine (ATARAX/VISTARIL) 50 MG tablet 1/2 tablet by mouth three times daily as  needed for anxiety 01/15/16   Corwin Levins, MD  ipratropium-albuterol (DUONEB) 0.5-2.5 (3) MG/3ML SOLN Take 3 mLs by nebulization every 6 (six) hours as needed (if inhaler does not resolve wheezing). 06/05/15   Calvert Cantor, MD  losartan (COZAAR) 50 MG tablet Take 1 tablet (50 mg total) by mouth daily. 08/22/15   Corwin Levins, MD  nicotine (NICODERM CQ - DOSED IN MG/24 HOURS) 21 mg/24hr patch Place 21 mg onto the skin daily.    Historical Provider, MD  OXYGEN Inhale 2 L/min into the lungs continuous.    Historical Provider, MD  pantoprazole (PROTONIX) 40 MG tablet Take 1 tablet (40 mg total) by mouth daily. 01/03/16   Clydia Llano,  MD  polyethylene glycol powder (GLYCOLAX/MIRALAX) powder Take 17 g by mouth daily. 01/15/16   Corwin Levins, MD  rosuvastatin (CRESTOR) 10 MG tablet Take 1 tablet (10 mg total) by mouth daily. 08/22/15   Corwin Levins, MD    ALLERGIES:  Allergies  Allergen Reactions  . Ativan [Lorazepam] Other (See Comments)    Causes increased confusion/AMS    SOCIAL HISTORY:  Social History  Substance Use Topics  . Smoking status: Former Smoker    Packs/day: 0.50    Years: 63.00    Types: Cigarettes    Quit date: 09/10/2015  . Smokeless tobacco: Never Used  . Alcohol use 0.0 oz/week     Comment: 01/02/2016 "last drink was before 09/10/2015    FAMILY HISTORY: Family History  Problem Relation Age of Onset  . Heart attack Father     EXAM: BP 129/68   Pulse 98   Temp 97.9 F (36.6 C) (Oral)   Resp 23   SpO2 98%  CONSTITUTIONAL: Elderly. Alert and oriented and responds appropriately to questions. Well-appearing; well-nourished HEAD: Normocephalic EYES: Conjunctivae clear, PERRL, EOMI ENT: normal nose; no rhinorrhea; moist mucous membranes NECK: Supple, no meningismus, no nuchal rigidity, no LAD  CARD: RRR; S1 and S2 appreciated; no murmurs, no clicks, no rubs, no gallops RESP: Normal chest excursion without splinting or tachypnea; breath sounds clear and equal bilaterally; no wheezes, no rhonchi, no rales, no hypoxia or respiratory distress, speaking full sentences ABD/GI: Normal bowel sounds; non-distended; soft, non-tender, no rebound, no guarding, no peritoneal signs, no hepatosplenomegaly RECTAL:  Normal rectal tone, no gross blood or melena, stool is dark, guaiac positive, no hemorrhoids appreciated, nontender rectal exam, no fecal impaction BACK:  The back appears normal and is non-tender to palpation, there is no CVA tenderness EXT: Normal ROM in all joints; non-tender to palpation; no edema; normal capillary refill; no cyanosis, no calf tenderness or swelling    SKIN: Normal color for  age and race; warm; no rash NEURO: Moves all extremities equally, has some baseline aphasia PSYCH: The patient's mood and manner are appropriate. Grooming and personal hygiene are appropriate.  MEDICAL DECISION MAKING: Patient here with symptomatic anemia.  She is hemodynamically stable. Hemoglobin here is 6.0. We will give 2 units of packed red blood cells. At this time they are not sure if they would want endoscopy or colonoscopy if needed. They agree with transfusion and admission. Daughter at bedside. We'll discuss with medicine for admission. PCP is Dr. Oliver Barre.  ED PROGRESS: 2:50 AM  Discussed patient's case with hospitalist, Dr. Clyde Lundborg.  Recommend admission to telemetry, inpatient bed.  I will place holding orders per their request. Patient and family (if present) updated with plan. Care transferred to hospitalist service.  I reviewed all nursing notes,  vitals, pertinent old records, EKGs, labs, imaging (as available).   I personally performed the services described in this documentation, which was scribed in my presence. The recorded information has been reviewed and is accurate.     Layla MawKristen N Korbyn Vanes, DO 03/08/16 [phone]

## 2016-03-08 NOTE — H&P (Signed)
History and Physical    Karina MoGertrud L Andrews ZOX:[id number] DOB: Jan 31, 1931 DOA: 03/08/2016  Referring MD/NP/PA:   PCP: Oliver BarreJames John, MD   Patient coming from:  The patient is coming from SNF.  At baseline, pt is dependent for most of ADL.   Chief Complaint: Generalized weakness, shortness of breath, lightheadedness  HPI: Karina Andrews is a 83 y.o. female with medical history significant of hypertension, hyperlipidemia, COPD, GERD, depression, anxiety, speech impediment, wheelchair-bound, dementia, aortic stenosis, generalized weakness, shortness breath and lightheadedness.  Patient states that she has been having generalized weakness, shortness of breath and lightheadedness for about 3 weeks, which has worsened in the past several days. She has black stool which she attributed to iron supplement use. Patient denies chest pain, hematemesis, hematuria, fever or chills. Denies abdominal pain, diarrhea, nausea or vomiting. No unilateral weakness. She states that she has dysuria and burning on urination sometimes. She had blood test in facility, found to have hemoglobin 5, therefore was sent to ED for further eval and treatment. Per her daughter, patient never had EGD or colonoscopy before.  ED Course: pt was found to have hemoglobin dropped from 10.1 on 01/09/16 to 6.0, positive FOBT, electrolytes renal function okay, temperature normal, slightly tachycardia, oxygen saturation 95% on room air. Pending UA. Patient is admitted to telemetry bed as inpatient.  Review of Systems:   General: no fevers, chills, no changes in body weight, has fatigue HEENT: no blurry vision, hearing changes or sore throat Respiratory: has dyspnea, no coughing, wheezing CV: no chest pain, no palpitations GI: no nausea, vomiting, abdominal pain, diarrhea, constipation GU: no dysuria, burning on urination, increased urinary frequency, hematuria  Ext: no leg edema Neuro: no unilateral weakness, numbness, or tingling, no  vision change or hearing loss Skin: no rash, no skin tear. MSK: No muscle spasm, no deformity, no limitation of range of movement in spin Heme: No easy bruising.  Travel history: No recent long distant travel.  Allergy:  Allergies  Allergen Reactions  . Ativan [Lorazepam] Other (See Comments)    Causes increased confusion/AMS    Past Medical History:  Diagnosis Date  . Alcohol abuse   . Anemia   . Anxiety   . Aortic stenosis    moderate on echo 09/2013  . Cervical spondylosis with myelopathy 01/2009   s/p decompression  . COPD (chronic obstructive pulmonary disease) (HCC)   . Dementia 09/06/2015  . Depression   . Fall at home 05/2015  . Heart murmur   . History of blood transfusion 01/02/2016   "anemia"  . Hypertension   . On home oxygen therapy    "2L; 24/7" (01/02/2016)  . Osteoporosis    (R) hip fx 11/2009 & (L) hip fx 2006  . Speech impediment    present for >9 months; no one has really been able to discern an etiology/daughter/notes 01/01/2016    Past Surgical History:  Procedure Laterality Date  . BACK SURGERY    . BLADDER SUSPENSION     "unsuccessful"  . CATARACT EXTRACTION W/ INTRAOCULAR LENS  IMPLANT, BILATERAL Bilateral 06/06/13 - 2016   "right - left"  . FRACTURE SURGERY    . HIP FRACTURE SURGERY Left 1980s  . LAPAROSCOPIC CHOLECYSTECTOMY  1995  . ORIF HIP FRACTURE Right 11/2009   Done by Dr. Turner Danielsowan  . POSTERIOR LAMINECTOMY / DECOMPRESSION CERVICAL SPINE  01/2009   Done by Dr. Danielle DessElsner  . WRIST FRACTURE SURGERY B[place]    [place]"  Social History:  reports that she quit smoking about 5 months ago. Her smoking use included Cigarettes. She has a 31.50 pack-year smoking history. She has never used smokeless tobacco. She reports that she drinks alcohol. She reports that she does not use drugs.  Family History:  Family History  Problem Relation Age of Onset  . Heart attack Father      Prior to Admission medications   Medication Sig  Start Date End Date Taking? Authorizing Provider  acetaminophen (TYLENOL) 500 MG tablet Take 500 mg by mouth every 8 (eight) hours as needed.   Yes Historical Provider, MD  albuterol (VENTOLIN HFA) 108 (90 Base) MCG/ACT inhaler Inhale 2 puffs into the lungs every 6 (six) hours as needed for wheezing or shortness of breath. 08/22/15  Yes Corwin Levins, MD  aspirin EC 81 MG tablet Take 1 tablet (81 mg total) by mouth daily. 08/22/15  Yes Corwin Levins, MD  benzonatate (TESSALON) 100 MG capsule Take 1 capsule by mouth 2 (two) times daily as needed for cough.  02/23/16  Yes Historical Provider, MD  budesonide-formoterol (SYMBICORT) 160-4.5 MCG/ACT inhaler Inhale 2 puffs into the lungs 2 (two) times daily. 11/14/15  Yes Nyoka Cowden, MD  ferrous sulfate (FERROUSUL) 325 (65 FE) MG tablet Take 1 tablet (325 mg total) by mouth at bedtime. 01/03/16  Yes Clydia Llano, MD  furosemide (LASIX) 20 MG tablet Take 1 tablet (20 mg total) by mouth daily. 01/01/16 03/31/16 Yes Rosalio Macadamia, NP  guaifenesin (ROBITUSSIN) 100 MG/5ML syrup Take 200 mg by mouth 3 (three) times daily as needed for cough.   Yes Historical Provider, MD  hydrOXYzine (ATARAX/VISTARIL) 50 MG tablet 1/2 tablet by mouth three times daily as needed for anxiety Patient taking differently: Take 25 mg by mouth 3 (three) times daily as needed for anxiety.  01/15/16  Yes Corwin Levins, MD  ipratropium-albuterol (DUONEB) 0.5-2.5 (3) MG/3ML SOLN Take 3 mLs by nebulization every 6 (six) hours as needed (if inhaler does not resolve wheezing). 06/05/15  Yes Calvert Cantor, MD  losartan (COZAAR) 50 MG tablet Take 1 tablet (50 mg total) by mouth daily. 08/22/15  Yes Corwin Levins, MD  nicotine (NICODERM CQ - DOSED IN MG/24 HOURS) 21 mg/24hr patch Place 21 mg onto the skin daily.   Yes Historical Provider, MD  pantoprazole (PROTONIX) 40 MG tablet Take 1 tablet (40 mg total) by mouth daily. 01/03/16  Yes Clydia Llano, MD  polyethylene glycol powder (GLYCOLAX/MIRALAX) powder  Take 17 g by mouth daily. 01/15/16  Yes Corwin Levins, MD  rosuvastatin (CRESTOR) 10 MG tablet Take 1 tablet (10 mg total) by mouth daily. 08/22/15  Yes Corwin Levins, MD  OXYGEN Inhale 2 L/min into the lungs continuous.    Historical Provider, MD    Physical Exam: Vitals:   03/07/16 2220 03/08/16 0130 03/08/16 0200 03/08/16 0300  BP: 130/62 128/61 129/68 132/75  Pulse: 106 89 98 97  Resp: 16 19 23 24   Temp: 97.9 F (36.6 C)     TempSrc: Oral     SpO2: 98% 100% 98% 98%   General: Not in acute distress HEENT:       Eyes: PERRL, EOMI, no scleral icterus.       ENT: No discharge from the ears and nose, no pharynx injection, no tonsillar enlargement.        Neck: No JVD, no bruit, no mass felt. Heme: No neck lymph node enlargement. Cardiac: S1/S2, RRR, 3/6 systolic murmurs,  No gallops or rubs. Respiratory: No rales, wheezing, rhonchi or rubs. GI: Soft, nondistended, nontender, no rebound pain, no organomegaly, BS present. GU: No hematuria Ext: No pitting leg edema bilaterally. 2+DP/PT pulse bilaterally. Musculoskeletal: No joint deformities, No joint redness or warmth, no limitation of ROM in spin. Skin: No rashes.  Neuro: Alert, oriented X3, cranial nerves II-XII grossly intact, moves all extremities normally.  Has speech impediment. Psych: Patient is not psychotic, no suicidal or hemocidal ideation.  Labs on Admission: I have personally reviewed following labs and imaging studies  CBC:  Recent Labs Lab 03/07/16 1611 03/07/16 2247  WBC 8.8 8.9  NEUTROABS 7.2  --   HGB 5.9 Repeated and verified X2.* 6.0*  HCT 18.6 Repeated and verified X2.* 20.1*  MCV 82.6 85.9  PLT 343.0 370   Basic Metabolic Panel:  Recent Labs Lab 03/07/16 1611 03/07/16 2247  NA 142 139  K 4.8 4.1  CL 108 105  CO2 28 24  GLUCOSE 113* 109*  BUN 22 20  CREATININE 0.63 0.70  CALCIUM 8.8 9.0   GFR: CrCl cannot be calculated (Unknown ideal weight.). Liver Function Tests:  Recent Labs Lab  03/07/16 2247  AST 38  ALT 61*  ALKPHOS 127*  BILITOT 0.7  PROT 6.5  ALBUMIN 3.1*   No results for input(s): LIPASE, AMYLASE in the last 168 hours. No results for input(s): AMMONIA in the last 168 hours. Coagulation Profile: No results for input(s): INR, PROTIME in the last 168 hours. Cardiac Enzymes: No results for input(s): CKTOTAL, CKMB, CKMBINDEX, TROPONINI in the last 168 hours. BNP (last 3 results)  Recent Labs  04/11/15 1731 03/07/16 1611  PROBNP 33.0 60.0   HbA1C: No results for input(s): HGBA1C in the last 72 hours. CBG: No results for input(s): GLUCAP in the last 168 hours. Lipid Profile: No results for input(s): CHOL, HDL, LDLCALC, TRIG, CHOLHDL, LDLDIRECT in the last 72 hours. Thyroid Function Tests: No results for input(s): TSH, T4TOTAL, FREET4, T3FREE, THYROIDAB in the last 72 hours. Anemia Panel:  Recent Labs  03/07/16 1611  IRON 7*   Urine analysis:    Component Value Date/Time   COLORURINE YELLOW 04/11/2015 1731   APPEARANCEUR CLEAR 04/11/2015 1731   LABSPEC 1.015 04/11/2015 1731   PHURINE 6.0 04/11/2015 1731   GLUCOSEU NEGATIVE 04/11/2015 1731   HGBUR TRACE-INTACT (A) 04/11/2015 1731   BILIRUBINUR NEGATIVE 04/11/2015 1731   KETONESUR 15 (A) 04/11/2015 1731   PROTEINUR NEGATIVE 12/04/2009 2312   UROBILINOGEN 0.2 04/11/2015 1731   NITRITE POSITIVE (A) 04/11/2015 1731   LEUKOCYTESUR Trace (A) 04/11/2015 1731   Sepsis Labs: @LABRCNTIP (procalcitonin:4,lacticidven:4) )No results found for this or any previous visit (from the past 240 hour(s)).   Radiological Exams on Admission: No results found.   EKG: Not done in ED, will get one.   Assessment/Plan Principal Problem:   Symptomatic anemia Active Problems:   Hyperlipidemia   Essential hypertension   Aortic stenosis, severe   Cigarette smoker   COPD (chronic obstructive pulmonary disease) (HCC)   GERD (gastroesophageal reflux disease)   Symptomatic anemia: this is possibly due to  GI bleeding given positive FOBT. 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 Hemoglobin A1c 6.0. Hemodynamically stable currently.  - will admit to tele bed - NPO - transfuse 2 units of blood - Start IV pantoprazole 40 mg bib - Zofran IV for nausea - Avoid NSAIDs and SQ heparin - Maintain IV access (2 large bore IVs if possible). - Monitor closely and follow  q6h cbc, transfuse as necessary. - LaB: INR, PTT - check LDH and LFT to r/o hemolysis - hold ASA - please call GI in AM  HLD: Last LDL was 148 on 04/11/15 -Continue home medications: Crestor -Check FLP  HTN: -Hold Lasix -continue Cozarr -IV hydralazine when necessary  Cigarette smoker: -Nicotine patch  GERD: -Protonix iv  COPD: stable. No wheezing or rhonchi on auscultation. -DuoNeb nebulizers, Dulera inhaler, when necessary or per nebulizer   DVT ppx: SCd Code Status: Full code Family Communication:   Yes, patient's daughter at bed side Disposition Plan:  Anticipate discharge back to previous SNF environment Consults called:  none Admission status:  Inpatient/tele   Date of Service 03/08/2016    Lorretta Harp [facility] Pager [phone]  If 7PM-7AM, please contact night-coverage [email or web address] Password [email or web address] 03/08/2016, 3:17 AM

## 2016-03-08 NOTE — Care Management (Signed)
[address] [facility] - GIP RN Visit  This is a related, covered GIP admission [facility].  [facility] with Aortic Stenosis.  She presented to the ED on  03/07/16 with increasing dyspnea and shortness or breath accompanied by weakness at home.  [facility].  Patinet is a DNR.  She was found to have a Hgb of 5.9 and Hct 18.6.  Her last Hgb on 01/09/16 was 10.1.  Currently receiving transfusion of RBC. [facility] Mindy as well as daughter [name] are present during my visit on today.  Please call with any hospice related questions or concerns  Thank you Chrisandra NettersCarlos S. Gwendolyn GrantWalden RN [facility] On Call RN [phone]

## 2016-03-08 NOTE — Progress Notes (Deleted)
1 unit PRBCs transfused (unit 2 of 2) and completed at 1934.  Patient remained asymptomatic throughout transfusion. Vital signs are stable and are as charted. Patient denies complaints.

## 2016-03-08 NOTE — Progress Notes (Signed)
PROGRESS NOTE        PATIENT DETAILS Name: Karina Andrews Age: 83 y.o. Sex: female Date of Birth: 1931/02/02 Admit Date: 03/08/2016 Admitting Physician Lorretta Harp, MD WUJ:[id number] Jonny Ruiz, MD  Brief Narrative: Patient is a 83 y.o. female with past medical history of severe aortic stenosis, COPD on home O2 admitted with melanotic stools and acute blood loss anemia. See below for further details  Subjective: Lying comfortably in bed-she is easily short of breath with minimal exertion. No melanotic stools for close to 2 days.  Assessment/Plan: Upper GI bleeding: Presented with melanotic stools-apparently this has been ongoing intermittently for the past few weeks. Her last melanotic bowel movement was close to 2 days ago. Long discussion with both patient and her daughter at bedside, given patient's poor overall functional status-frailty and underlying severe AS, COPD-they did not want to pursue endoscopic evaluation. Hospice is already following patient at ALF-we have agreed to just transfuse PRBC-care will not be escalated any further.  Acute on chronic blood loss anemia: Likely secondary to above. Patient/family has decided not to pursue any endoscopic evaluation. Plans are to transfuse and follow CBC.  Severe aortic stenosis: Recently evaluated by cardiothoracic surgery-and deemed not a candidate for any further intervention. Already followed by hospice at ALF.  COPD with chronic hypoxemic respiratory failure: On home O2-lungs are currently clear.  Hypertension: Controlled, continue losartan  Dyslipidemia: Continue statin  Goals of care/palliative care: Very frail 83 year old patient with severe AF, chronic hypoxemic respiratory failure on home O2 due to COPD-followed by hospice-admitted with melanotic stools and acute blood loss anemia. Long discussion with patient's daughter along with the patient at bedside earlier this morning, DO NOT RESUSCITATE reconfirmed.  Patient does not desire any aggressive measures including endoscopic evaluation. We have agreed to transfuse and follow CBCs. GI bleeding does not appear to be brisk-suspect we can discharge her back to ALF over the weekend.  DVT Prophylaxis: SCD's  Code Status: DNR  Family Communication: Daughter at bedside  Disposition Plan: Remain inpatient-back to ALF in the next day or so  Antimicrobial agents: Anti-infectives    None      Procedures: None  CONSULTS:  None  Time spent: 25- minutes-Greater than 50% of this time was spent in counseling, explanation of diagnosis, planning of further management, and coordination of care.  MEDICATIONS: Scheduled Meds: . sodium chloride  10 mL/hr Intravenous Once  . ferrous sulfate  325 mg Oral QHS  . losartan  50 mg Oral Daily  . mometasone-formoterol  2 puff Inhalation BID  . nicotine  21 mg Transdermal Daily  . pantoprazole (PROTONIX) IV  40 mg Intravenous Q12H  . rosuvastatin  10 mg Oral Daily  . sodium chloride flush  3 mL Intravenous Q12H   Continuous Infusions: . sodium chloride     PRN Meds:.acetaminophen, albuterol, guaiFENesin, hydrALAZINE, hydrOXYzine, ipratropium-albuterol, ondansetron **OR** ondansetron (ZOFRAN) IV, polyethylene glycol, zolpidem   PHYSICAL EXAM: Vital signs: Vitals:   03/08/16 0839 03/08/16 0907 03/08/16 1130 03/08/16 1135  BP: (!) 104/59 (!) 115/46 129/68 (!) 128/59  Pulse: 97 89 89 84  Resp: 18 18 18 18   Temp: 98.1 F (36.7 C) 98.4 F (36.9 C) 97.8 F (36.6 C) 97.7 F (36.5 C)  TempSrc: Oral Oral Oral Oral  SpO2: 99% 100% 99% 99%  Weight:       [employer]  03/08/16 0433  Weight: 59.2 kg (130 lb 8.2 oz)   Body mass index is 23.12 kg/m.   General appearance :Awake, alert, Lying comfortably-becomes dyspneic with minimal exertion. HEENT: Atraumatic and Normocephalic Neck: supple, no JVD. No cervical lymphadenopathy.  Resp:Good air entry bilaterally, no added sounds  CVS: S1 S2  regular, 3/6 systolic murmurs.  GI: Bowel sounds present, Non tender and not distended with no gaurding, rigidity or rebound.No organomegaly Extremities: B/L Lower Ext shows no edema, both legs are warm to touch Neurology:  speech clear,Non focal, sensation is grossly intact. Musculoskeletal:No digital cyanosis Skin:No Rash, warm and dry Wounds:N/A  I have personally reviewed following labs and imaging studies  LABORATORY DATA: CBC:  Recent Labs Lab 03/07/16 1611 03/07/16 2247 03/08/16 0512  WBC 8.8 8.9 6.4  NEUTROABS 7.2  --   --   HGB 5.9 Repeated and verified X2.* 6.0* 5.5*  HCT 18.6 Repeated and verified X2.* 20.1* 18.5*  MCV 82.6 85.9 86.4  PLT 343.0 370 299    Basic Metabolic Panel:  Recent Labs Lab 03/07/16 1611 03/07/16 2247 03/08/16 0512  NA 142 139 139  K 4.8 4.1 3.8  CL 108 105 106  CO2 28 24 26   GLUCOSE 113* 109* 101*  BUN 22 20 16   CREATININE 0.63 0.70 0.67  CALCIUM 8.8 9.0 8.6*    GFR: Estimated Creatinine Clearance: 44.1 mL/min (by C-G formula based on SCr of 0.67 mg/dL).  Liver Function Tests:  Recent Labs Lab 03/07/16 2247 03/08/16 0512  AST 38 39  ALT 61* 57*  ALKPHOS 127* 119  BILITOT 0.7 0.5  PROT 6.5 5.9*  ALBUMIN 3.1* 2.7*   No results for input(s): LIPASE, AMYLASE in the last 168 hours. No results for input(s): AMMONIA in the last 168 hours.  Coagulation Profile:  Recent Labs Lab 03/08/16 0512  INR 1.08    Cardiac Enzymes: No results for input(s): CKTOTAL, CKMB, CKMBINDEX, TROPONINI in the last 168 hours.  BNP (last 3 results)  Recent Labs  04/11/15 1731 03/07/16 1611  PROBNP 33.0 60.0    HbA1C: No results for input(s): HGBA1C in the last 72 hours.  CBG:  Recent Labs Lab 03/08/16 0740  GLUCAP 91    Lipid Profile: No results for input(s): CHOL, HDL, LDLCALC, TRIG, CHOLHDL, LDLDIRECT in the last 72 hours.  Thyroid Function Tests: No results for input(s): TSH, T4TOTAL, FREET4, T3FREE, THYROIDAB in the  last 72 hours.  Anemia Panel:  Recent Labs  03/07/16 1611  IRON 7*    Urine analysis:    Component Value Date/Time   COLORURINE YELLOW 04/11/2015 1731   APPEARANCEUR CLEAR 04/11/2015 1731   LABSPEC 1.015 04/11/2015 1731   PHURINE 6.0 04/11/2015 1731   GLUCOSEU NEGATIVE 04/11/2015 1731   HGBUR TRACE-INTACT (A) 04/11/2015 1731   BILIRUBINUR NEGATIVE 04/11/2015 1731   KETONESUR 15 (A) 04/11/2015 1731   PROTEINUR NEGATIVE 12/04/2009 2312   UROBILINOGEN 0.2 04/11/2015 1731   NITRITE POSITIVE (A) 04/11/2015 1731   LEUKOCYTESUR Trace (A) 04/11/2015 1731    Sepsis Labs: Lactic Acid, Venous No results found for: LATICACIDVEN  MICROBIOLOGY: No results found for this or any previous visit (from the past 240 hour(s)).  RADIOLOGY STUDIES/RESULTS: No results found.   LOS: 0 days   Jeoffrey MassedGHIMIRE,Darianna Amy, MD  [facility] Pager:[phone]  If 7PM-7AM, please contact night-coverage [email or web address] Password [email or web address] 03/08/2016, 1:03 PM

## 2016-03-08 NOTE — Progress Notes (Addendum)
1 unit PRBC (1 of 2 units) transfused and completed at 1130.  Patient remained asymptomatic throughout transfusion. Vital signs stable and as charted. Patient denies complaints.

## 2016-03-08 NOTE — Progress Notes (Signed)
1 unit PRBCs transfused (unit 2 of 2) and completed at 1934.  Patient remained asymptomatic throughout transfusion. Vital signs are stable and are as charted. Patient denies complaints.  

## 2016-03-08 NOTE — ED Notes (Signed)
Dr. Nui at bedside at this time.  

## 2016-03-09 DIAGNOSIS — D62 Acute posthemorrhagic anemia: Secondary | ICD-10-CM

## 2016-03-09 DIAGNOSIS — K922 Gastrointestinal hemorrhage, unspecified: Principal | ICD-10-CM

## 2016-03-09 LAB — CBC
HCT: 25.6 % — ABNORMAL LOW (ref 36.0–46.0)
Hemoglobin: 8.1 g/dL — ABNORMAL LOW (ref 12.0–15.0)
MCH: 26.9 pg (ref 26.0–34.0)
MCHC: 31.6 g/dL (ref 30.0–36.0)
MCV: 85 fL (ref 78.0–100.0)
PLATELETS: 249 10*3/uL (ref 150–400)
RBC: 3.01 MIL/uL — AB (ref 3.87–5.11)
RDW: 18.6 % — ABNORMAL HIGH (ref 11.5–15.5)
WBC: 7.5 10*3/uL (ref 4.0–10.5)

## 2016-03-09 LAB — GLUCOSE, CAPILLARY: GLUCOSE-CAPILLARY: 99 mg/dL (ref 65–99)

## 2016-03-09 MED ORDER — IPRATROPIUM-ALBUTEROL 0.5-2.5 (3) MG/3ML IN SOLN
3.0000 mL | Freq: Two times a day (BID) | RESPIRATORY_TRACT | Status: DC
  Administered 2016-03-09: 3 mL via RESPIRATORY_TRACT
  Filled 2016-03-09: qty 3

## 2016-03-09 MED ORDER — FERROUS SULFATE 325 (65 FE) MG PO TABS: 325.0000 mg | ORAL_TABLET | Freq: Two times a day (BID) | ORAL | 0 refills | Status: DC

## 2016-03-09 MED ORDER — DOCUSATE SODIUM 100 MG PO CAPS: 100.0000 mg | ORAL_CAPSULE | Freq: Two times a day (BID) | ORAL | 0 refills | Status: DC

## 2016-03-09 NOTE — Progress Notes (Signed)
Reviewed medical records and briefly met with patient at bedside. She is alert, oriented, and following commands. Denies pain or discomfort. Current hospice patient with [facility] at assisted living facility for severe aortic stenosis and COPD. Presenting with melanotic stools and anemia. Patient and family not wanting to pursue EGD evaluation. Patient tells me she feels better after transfusion. Spoke with Dr. Sloan Leiter. No further palliative needs at this time. Plan is to discharge back to ALF with hospice services. Thank you for involving the PMT in the care of Karina Andrews.   NO CHARGE  Ihor Dow, FNP-C  Palliative Medicine Team  Phone: [phone] Fax: [phone]

## 2016-03-09 NOTE — [place] FL2 (Signed)
[place] MEDICAID FL2 LEVEL OF CARE SCREENING TOOL     IDENTIFICATION  Patient Name: Karina Andrews Birthdate: 03-Dec-1930 Sex: female Admission Date (Current Location): 03/08/2016  [facility] and [place] Number:  Producer, television/film/video and Address:  [facility] [place], [address], [place], [place] [address]      Provider Number: [id number]  Attending Physician Name and Address:  Maretta Bees, MD  Relative Name and Phone Number:  Dtr [phone]    Current Level of Care: Hospital Recommended Level of Care: Assisted Living Facility Prior Approval Number:    Date Approved/Denied:   PASRR Number: [id number]  Discharge Plan: Other (Comment) (ALF-[facility])    Current Diagnoses: Patient Active Problem List   Diagnosis Date Noted  . GERD (gastroesophageal reflux disease) 03/08/2016  . Anxiety state 01/20/2016  . Chronic respiratory failure with hypoxia (HCC) 01/15/2016  . Constipation 01/15/2016  . Symptomatic anemia 01/02/2016  . Anemia 01/02/2016  . Gait disorder 08/22/2015  . COPD GOLD II  08/04/2015  . COPD (chronic obstructive pulmonary disease) (HCC) 07/01/2015  . Multiple fractures of ribs of right side 06/05/2015  . Encounter for palliative care   . Goals of care, counseling/discussion   . Closed C1 fracture (HCC) 06/02/2015  . Fracture of maxilla, closed (HCC) 06/02/2015  . Fall 05/31/2015  . Alcohol abuse 05/31/2015  . Dysarthria 05/17/2015  . Wellness examination 05/11/2015  . Indeterminate pulmonary nodules 05/11/2015  . COPD exacerbation (HCC) 05/05/2015  . Dyspnea 04/11/2015  . Cigarette smoker 04/11/2015  . Aortic stenosis, severe 06/15/2013  . Insomnia 06/15/2013  . Senile osteoporosis   . 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 Hyperlipidemia 12/12/2008  . Depression 12/12/2008  . DEGENERATIVE DISC DISEASE, CERVICAL SPINE, WITH MYELOPATHY 12/12/2008  . NICOTINE ADDICTION 06/14/2008  . Essential  hypertension 06/14/2008  . OBSTRUCTIVE CHRONIC BRONCHITIS 06/14/2008    Orientation RESPIRATION BLADDER Height & Weight     Self, Time, Situation, Place  Normal Continent Weight: 130 lb 8.2 oz (59.2 kg) Height:     BEHAVIORAL SYMPTOMS/MOOD NEUROLOGICAL BOWEL NUTRITION STATUS      Continent    AMBULATORY STATUS COMMUNICATION OF NEEDS Skin   Independent Verbally Normal                       Personal Care Assistance Level of Assistance  Bathing           Functional Limitations Info  Sight, Hearing, Speech Sight Info: Adequate Hearing Info: Adequate Speech Info: Adequate    SPECIAL CARE FACTORS FREQUENCY                       Contractures Contractures Info: Not present    Additional Factors Info  Code Status, Allergies Code Status Info: DNR Allergies Info: Ativan Lorazepam           Current Medications (03/09/2016):  This is the current hospital active medication list Current Facility-Administered Medications  Medication Dose Route Frequency Provider Last Rate Last Dose  . 0.9 %  sodium chloride infusion  10 mL/hr Intravenous Once Kristen N Ward, DO      . acetaminophen (TYLENOL) tablet 500 mg  500 mg Oral Q8H PRN Lorretta Harp, MD   500 mg at 03/08/16 1716  . albuterol (PROVENTIL) (2.5 MG/3ML) 0.083% nebulizer solution 2.5 mg  2.5 mg Nebulization Q2H PRN Maretta Bees, MD   2.5 mg at 03/08/16 1349  . ferrous sulfate  tablet 325 mg  325 mg Oral QHS Lorretta HarpXilin Niu, MD   325 mg at 03/08/16 2225  . guaiFENesin (ROBITUSSIN) 100 MG/5ML solution 200 mg  200 mg Oral TID PRN Lorretta HarpXilin Niu, MD      . hydrALAZINE (APRESOLINE) injection 5 mg  5 mg Intravenous Q2H PRN Lorretta HarpXilin Niu, MD      . hydrOXYzine (ATARAX/VISTARIL) tablet 25 mg  25 mg Oral TID PRN Lorretta HarpXilin Niu, MD   25 mg at 03/09/16 0859  . ipratropium-albuterol (DUONEB) 0.5-2.5 (3) MG/3ML nebulizer solution 3 mL  3 mL Nebulization BID Maretta BeesShanker M Ghimire, MD   3 mL at 03/09/16 0924  . losartan (COZAAR) tablet 50 mg  50 mg Oral  Daily Lorretta HarpXilin Niu, MD   50 mg at 03/09/16 0900  . mometasone-formoterol (DULERA) 200-5 MCG/ACT inhaler 2 puff  2 puff Inhalation BID Lorretta HarpXilin Niu, MD   2 puff at 03/09/16 0929  . nicotine (NICODERM CQ - dosed in mg/24 hours) patch 21 mg  21 mg Transdermal Daily Lorretta HarpXilin Niu, MD   21 mg at 03/09/16 0906  . ondansetron (ZOFRAN) tablet 4 mg  4 mg Oral Q6H PRN Lorretta HarpXilin Niu, MD       Or  . ondansetron [facility](ZOFRAN) injection 4 mg  4 mg Intravenous Q6H PRN Lorretta HarpXilin Niu, MD      . pantoprazole (PROTONIX) injection 40 mg  40 mg Intravenous Q12H Lorretta HarpXilin Niu, MD   40 mg at 03/09/16 0900  . polyethylene glycol (MIRALAX / GLYCOLAX) packet 17 g  17 g Oral Daily PRN Lorretta HarpXilin Niu, MD      . rosuvastatin (CRESTOR) tablet 10 mg  10 mg Oral Daily Lorretta HarpXilin Niu, MD   10 mg at 03/09/16 0900  . sodium chloride flush (NS) 0.9 % injection 3 mL  3 mL Intravenous Q12H Lorretta HarpXilin Niu, MD   3 mL at 03/09/16 0908  . zolpidem (AMBIEN) tablet 5 mg  5 mg Oral QHS PRN Lorretta HarpXilin Niu, MD   5 mg at 03/08/16 2225     Discharge Medications: Please see discharge summary for a list of discharge medications.  Relevant Imaging Results:  Relevant Lab Results:   Additional Information [id number]  [place] Hill[place]OWN, FurmanSHELBY B, LCSWA

## 2016-03-09 NOTE — Clinical Social Work Note (Signed)
Clinical Social Worker notified patient is ready to DC, CSW facilitated patient discharge including contacting patient family and facility ([facility]) to confirm patient discharge plans. Clinical information faxed to facility and pt/family agreeable with plan.CS[facility]. RN to call report prior to discharge.  Clinical Social Worker will sign off for now as social work intervention is no longer needed. Please consult us again if new need arises.  Annaka Cleaver B. Gean QuintBrown,MSW, LCSWA Clinical Social Work Dept Weekend Social Worker [phone] 11:31 AM

## 2016-03-09 NOTE — Progress Notes (Signed)
Patient leaving in stable condition via [facility]. Inhaler given to patient upon discharge.

## 2016-03-09 NOTE — Discharge Summary (Addendum)
PATIENT DETAILS Name: Karina Andrews Age: 83 y.o. Sex: female Date of Birth: 09/25/1930 MRN: [id number]. Admitting Physician: Lorretta Harp, MD WUJ:[id number] Jonny Ruiz, MD  Admit Date: 03/08/2016 Discharge date: 03/09/2016  Recommendations for Outpatient Follow-up:  1. Follow up with PCP in 1-2 weeks 2. Please check CBC periodically  3. Consider transitioning to residential hospice/full comfort measures-if patient develops severe anemia or overt GI bleeding in the future  Admitted From:  ALF    Disposition: ALF    Home Health: No  Equipment/Devices: Continue home oxygen as previous  Discharge Condition: Stable  CODE STATUS: FULL CODE  Diet recommendation:  Heart Healthy   Brief Summary: See H&P, Labs, Consult and Test reports for all details in brief, Patient is a 83 y.o. female with past medical history of severe aortic stenosis, COPD on home O2 admitted with melanotic stools and acute blood loss anemia. See below for further details  Brief Hospital Course: Upper GI bleeding: Presented with melanotic stools-apparently this has been ongoing intermittently for the past few weeks. Per RN, she did have a bowel movement yesterday-this is mostly brown in color-no overt melena observed. I suspect patient has a chronic GI bleeding that results in intermittent melanotic stools.  Long discussion with both patient and her daughter at bedside, given patient's poor overall functional status-frailty and underlying severe AS, COPD-they did not want to pursue endoscopic evaluation. Hospice is already following patient at ALF-we agreed to just transfuse 2 units of PRBC, posttransfusion hemoglobin is stable at 8.1. Continue PPI-consider transitioning to residential hospice/comfort measures in the new future if GI bleeding becomes more brisk or patient develops severe anemia.  Acute on chronic blood loss anemia: Likely secondary to above. Patient/family has decided not to pursue any endoscopic  evaluation. As noted above, she was transfused 2 units of PRBC-hemoglobin stable at 8.1. See above for further details   Severe aortic stenosis: Recently evaluated by cardiothoracic surgery-and deemed not a candidate for any further intervention. Already followed by hospice at ALF.  COPD with chronic hypoxemic respiratory failure: On home O2-lungs are currently clear.  Hypertension: Controlled, continue losartan  Dyslipidemia: Continue statin  Goals of care/palliative care: Very frail 83 year old patient with severe AF, chronic hypoxemic respiratory failure on home O2 due to COPD-followed by hospice-admitted with melanotic stools and acute blood loss anemia. Long discussion with patient's daughter along with the patient at bedside earlier this morning, DO NOT RESUSCITATE reconfirmed. Patient does not desire any aggressive measures including endoscopic evaluation. Consider transitioning to comfort measures in the near future if patient develops overt GI bleeding or severe anemia.  Procedures/Studies:  None  Discharge Diagnoses:  Principal Problem:   Symptomatic anemia Active Problems:   Hyperlipidemia   Essential hypertension   Aortic stenosis, severe   Cigarette smoker   COPD (chronic obstructive pulmonary disease) (HCC)   GERD (gastroesophageal reflux disease)   Discharge Instructions:  Activity:  As tolerated with Full fall precautions use walker/cane & assistance as needed  Discharge Instructions    Call MD for:  extreme fatigue    Complete by:  As directed    Call MD for:  persistant dizziness or light-headedness    Complete by:  As directed    Diet - low sodium heart healthy    Complete by:  As directed    Increase activity slowly    Complete by:  As directed      Allergies as of 03/09/2016      Reactions   Ativan [lorazepam] Other (  See Comments)   Causes increased confusion/AMS      Medication List    STOP taking these medications   aspirin EC 81 MG  tablet     TAKE these medications   acetaminophen 500 MG tablet Commonly known as:  TYLENOL Take 500 mg by mouth every 8 (eight) hours as needed.   albuterol 108 (90 Base) MCG/ACT inhaler Commonly known as:  VENTOLIN HFA Inhale 2 puffs into the lungs every 6 (six) hours as needed for wheezing or shortness of breath.   benzonatate 100 MG capsule Commonly known as:  TESSALON Take 1 capsule by mouth 2 (two) times daily as needed for cough.   budesonide-formoterol 160-4.5 MCG/ACT inhaler Commonly known as:  SYMBICORT Inhale 2 puffs into the lungs 2 (two) times daily.   docusate sodium 100 MG capsule Commonly known as:  COLACE Take 1 capsule (100 mg total) by mouth 2 (two) times daily.   ferrous sulfate 325 (65 FE) MG tablet Commonly known as:  FERROUSUL Take 1 tablet (325 mg total) by mouth 2 (two) times daily with a meal. What changed:  when to take this   furosemide 20 MG tablet Commonly known as:  LASIX Take 1 tablet (20 mg total) by mouth daily.   guaifenesin 100 MG/5ML syrup Commonly known as:  ROBITUSSIN Take 200 mg by mouth 3 (three) times daily as needed for cough.   hydrOXYzine 50 MG tablet Commonly known as:  ATARAX/VISTARIL 1/2 tablet by mouth three times daily as needed for anxiety What changed:  how much to take  how to take this  when to take this  reasons to take this  additional instructions   ipratropium-albuterol 0.5-2.5 (3) MG/3ML Soln Commonly known as:  DUONEB Take 3 mLs by nebulization every 6 (six) hours as needed (if inhaler does not resolve wheezing).   losartan 50 MG tablet Commonly known as:  COZAAR Take 1 tablet (50 mg total) by mouth daily.   nicotine 21 mg/24hr patch Commonly known as:  NICODERM CQ - dosed in mg/24 hours Place 21 mg onto the skin daily.   OXYGEN Inhale 2 L/min into the lungs continuous.   pantoprazole 40 MG tablet Commonly known as:  PROTONIX Take 1 tablet (40 mg total) by mouth daily.   polyethylene  glycol powder powder Commonly known as:  GLYCOLAX/MIRALAX Take 17 g by mouth daily.   rosuvastatin 10 MG tablet Commonly known as:  CRESTOR Take 1 tablet (10 mg total) by mouth daily.      Follow-up Information    Oliver BarreJames John, MD. Schedule an appointment as soon as possible for a visit in 1 week(s).   Specialties:  Internal Medicine, Radiology Contact information: [address] [facility] [place] [id number] [phone]          Allergies  Allergen Reactions  . Ativan [Lorazepam] Other (See Comments)    Causes increased confusion/AMS    Consultations:  Palliative care   Other Procedures/Studies:  No results found.   TODAY-DAY OF DISCHARGE:  Subjective:   Karina PitcherGertrud Andrews today has no headache,no chest abdominal pain,no new weakness tingling or numbness, feels much better wants to go home today.   Objective:   Blood pressure (!) 125/52, pulse 88, temperature 98 F (36.7 C), temperature source Oral, resp. rate 16, weight 59.2 kg (130 lb 8.2 oz), SpO2 98 %.  Intake/Output Summary (Last 24 hours) at 03/09/16 1033 Last data filed at 03/09/16 0911  Gross per 24 hour  Intake  1425 ml  Output              200 ml  Net             1225 ml   Filed Weights   03/08/16 0433  Weight: 59.2 kg (130 lb 8.2 oz)    Exam: Awake Alert, Oriented *3, No new F.N deficits, Normal affect [place].AT,PERRAL Supple Neck,No JVD, No cervical lymphadenopathy appriciated.  Symmetrical Chest wall movement, Good air movement bilaterally, CTAB RRR,No Gallops,Rubs or new Murmurs, No Parasternal Heave +ve B.Sounds, Abd Soft, Non tender, No organomegaly appriciated, No rebound -guarding or rigidity. No Cyanosis, Clubbing or edema, No new Rash or bruise   PERTINENT RADIOLOGIC STUDIES: No results found.   PERTINENT LAB RESULTS: CBC:  Recent Labs  03/08/16 0512 03/09/16 0602  WBC 6.4 7.5  HGB 5.5* 8.1*  HCT 18.5* 25.6*  PLT 299 249   CMET CMP     Component Value  Date/Time   NA 139 03/08/2016 0512   NA 144 06/11/2015   K 3.8 03/08/2016 0512   CL 106 03/08/2016 0512   CO2 26 03/08/2016 0512   GLUCOSE 101 (H) 03/08/2016 0512   BUN 16 03/08/2016 0512   BUN 19 06/11/2015   CREATININE 0.67 03/08/2016 0512   CREATININE 0.67 01/01/2016 1446   CALCIUM 8.6 (L) 03/08/2016 0512   PROT 5.9 (L) 03/08/2016 0512   ALBUMIN 2.7 (L) 03/08/2016 0512   AST 39 03/08/2016 0512   ALT 57 (H) 03/08/2016 0512   ALKPHOS 119 03/08/2016 0512   BILITOT 0.5 03/08/2016 0512   GFRNONAA >60 03/08/2016 0512   GFRAA >60 03/08/2016 0512    GFR Estimated Creatinine Clearance: 44.1 mL/min (by C-G formula based on SCr of 0.67 mg/dL). No results for input(s): LIPASE, AMYLASE in the last 72 hours. No results for input(s): CKTOTAL, CKMB, CKMBINDEX, TROPONINI in the last 72 hours. Invalid input(s): POCBNP No results for input(s): DDIMER in the last 72 hours. No results for input(s): HGBA1C in the last 72 hours. No results for input(s): CHOL, HDL, LDLCALC, TRIG, CHOLHDL, LDLDIRECT in the last 72 hours. No results for input(s): TSH, T4TOTAL, T3FREE, THYROIDAB in the last 72 hours.  Invalid input(s): FREET3  Recent Labs  03/07/16 1611  IRON 7*   Coags:  Recent Labs  03/08/16 0512  INR 1.08   Microbiology: No results found for this or any previous visit (from the past 240 hour(s)).  FURTHER DISCHARGE INSTRUCTIONS:  Get Medicines reviewed and adjusted: Please take all your medications with you for your next visit with your Primary MD  Laboratory/radiological data: Please request your Primary MD to go over all hospital tests and procedure/radiological results at the follow up, please ask your Primary MD to get all Hospital records sent to his/her office.  In some cases, they will be blood work, cultures and biopsy results pending at the time of your discharge. Please request that your primary care M.D. goes through all the records of your hospital data and follows up  on these results.  Also Note the following: If you experience worsening of your admission symptoms, develop shortness of breath, life threatening emergency, suicidal or homicidal thoughts you must seek medical attention immediately by calling 911 or calling your MD immediately  if symptoms less severe.  You must read complete instructions/literature along with all the possible adverse reactions/side effects for all the Medicines you take and that have been prescribed to you. Take any new Medicines after you have completely understood and  accpet all the possible adverse reactions/side effects.   Do not drive when taking Pain medications or sleeping medications (Benzodaizepines)  Do not take more than prescribed Pain, Sleep and Anxiety Medications. It is not advisable to combine anxiety,sleep and pain medications without talking with your primary care practitioner  Special Instructions: If you have smoked or chewed Tobacco  in the last 2 yrs please stop smoking, stop any regular Alcohol  and or any Recreational drug use.  Wear Seat belts while driving.  Please note: You were cared for by a hospitalist during your hospital stay. Once you are discharged, your primary care physician will handle any further medical issues. Please note that NO REFILLS for any discharge medications will be authorized once you are discharged, as it is imperative that you return to your primary care physician (or establish a relationship with a primary care physician if you do not have one) for your post hospital discharge needs so that they can reassess your need for medications and monitor your lab values.  Total Time spent coordinating discharge including counseling, education and face to face time equals  45 minutes.  SignedJeoffrey Massed 03/09/2016 10:33 AM

## 2016-03-09 NOTE — Progress Notes (Signed)
This RN called nurse at [facility] and gave report to Nationwide Mutual InsuranceCheryl Place, Rn. All questions answered. Nurse has been informed that patient will be transported by [facility] after 2pm today per daughter's request. Patient is stable and denies complaints at this time. Assessment is as charted.

## 2016-03-09 NOTE — Clinical Social Work Placement (Signed)
   CLINICAL SOCIAL WORK PLACEMENT  NOTE  Date:  03/09/2016  Patient Details  Name: Karina Andrews MRN: [id number] Date of Birth: [birth year]  Clinical Social Work is seeking post-discharge placement for this patient at the Assisted Living Facility level of care (*CSW will initial, date and re-position this form in  chart as items are completed):  Yes   Patient/family provided with [place] Clinical Social Work Department's list of facilities offering this level of care within the geographic area requested by the patient (or if unable, by the patient's family).  Yes   Patient/family informed of their freedom to choose among providers that offer the needed level of care, that participate in Medicare, Medicaid or managed care program needed by the patient, have an available bed and are willing to accept the patient.  Yes   Patient/family informed of [facility]'s ownership interest in [facility] and [facility], as well as of the fact that they are under no obligation to receive care at these facilities.  PASRR submitted to EDS on       PASRR number received on       Existing PASRR number confirmed on 03/09/16     FL2 transmitted to all facilities in geographic area requested by pt/family on       FL2 transmitted to all facilities within larger geographic area on       Patient informed that his/her managed care company has contracts with or will negotiate with certain facilities, including the following:            Patient/family informed of bed offers received.  Patient [facility]  [facility]     Physician recommends and patient chooses bed at      Patient to be transferred to  [facility]) on 03/09/16.  Patient to be transferred to facility by  Sharin Mons([facility])     Patient family notified on 03/09/16 of transfer.  Name of family member notified:  Dtr [phone]     PHYSICIAN Please sign FL2     Additional Comment:     _______________________________________________ Norlene DuelBROWN, Tadashi Burkel B, LCSWA 03/09/2016, 11:21 AM

## 2016-03-10 ENCOUNTER — Telehealth: Payer: Self-pay

## 2016-03-10 LAB — GLUCOSE, CAPILLARY: GLUCOSE-CAPILLARY: 99 mg/dL (ref 65–99)

## 2016-03-10 NOTE — Telephone Encounter (Signed)
Pt in on TCM list. Dc'ed 03/09/2016 after admission for symptomatic anemia.   LVM for pt to call back as soon as possible.

## 2016-03-11 LAB — TYPE AND SCREEN
BLOOD PRODUCT EXPIRATION DATE: 201801212359
Blood Product Expiration Date: 201801262359
Blood Product Expiration Date: 201801262359
ISSUE DATE / TIME: 201801130840
ISSUE DATE / TIME: 201801131545
UNIT TYPE AND RH: 6200
Unit Type and Rh: 6200
Unit Type and Rh: 6200

## 2016-03-27 ENCOUNTER — Ambulatory Visit: Payer: Commercial Managed Care - HMO | Admitting: Internal Medicine

## 2016-03-27 DIAGNOSIS — Z9181 History of falling: Secondary | ICD-10-CM | POA: Diagnosis not present

## 2016-03-27 DIAGNOSIS — J441 Chronic obstructive pulmonary disease with (acute) exacerbation: Secondary | ICD-10-CM | POA: Diagnosis not present

## 2016-03-27 DIAGNOSIS — R0602 Shortness of breath: Secondary | ICD-10-CM | POA: Diagnosis not present

## 2016-03-27 DIAGNOSIS — J449 Chronic obstructive pulmonary disease, unspecified: Secondary | ICD-10-CM | POA: Diagnosis not present

## 2016-03-27 DIAGNOSIS — M6281 Muscle weakness (generalized): Secondary | ICD-10-CM | POA: Diagnosis not present

## 2016-03-28 ENCOUNTER — Other Ambulatory Visit (INDEPENDENT_AMBULATORY_CARE_PROVIDER_SITE_OTHER): Payer: Medicare HMO

## 2016-03-28 ENCOUNTER — Ambulatory Visit (INDEPENDENT_AMBULATORY_CARE_PROVIDER_SITE_OTHER): Admitting: Internal Medicine

## 2016-03-28 ENCOUNTER — Encounter: Payer: Self-pay | Admitting: Internal Medicine

## 2016-03-28 DIAGNOSIS — F411 Generalized anxiety disorder: Secondary | ICD-10-CM

## 2016-03-28 DIAGNOSIS — J449 Chronic obstructive pulmonary disease, unspecified: Secondary | ICD-10-CM | POA: Diagnosis not present

## 2016-03-28 DIAGNOSIS — D500 Iron deficiency anemia secondary to blood loss (chronic): Secondary | ICD-10-CM

## 2016-03-28 LAB — CBC WITH DIFFERENTIAL/PLATELET
BASOS ABS: 0.1 10*3/uL (ref 0.0–0.1)
Basophils Relative: 1 % (ref 0.0–3.0)
Eosinophils Absolute: 0.3 10*3/uL (ref 0.0–0.7)
Eosinophils Relative: 4.1 % (ref 0.0–5.0)
HCT: 38 % (ref 36.0–46.0)
Hemoglobin: 12 g/dL (ref 12.0–15.0)
LYMPHS ABS: 1.5 10*3/uL (ref 0.7–4.0)
Lymphocytes Relative: 23.2 % (ref 12.0–46.0)
MCHC: 31.7 g/dL (ref 30.0–36.0)
MCV: 85.1 fl (ref 78.0–100.0)
MONOS PCT: 9 % (ref 3.0–12.0)
Monocytes Absolute: 0.6 10*3/uL (ref 0.1–1.0)
NEUTROS PCT: 62.7 % (ref 43.0–77.0)
Neutro Abs: 4.1 10*3/uL (ref 1.4–7.7)
Platelets: 191 10*3/uL (ref 150.0–400.0)
RBC: 4.47 Mil/uL (ref 3.87–5.11)
RDW: 15 % (ref 11.5–15.5)
WBC: 6.5 10*3/uL (ref 4.0–10.5)

## 2016-03-28 MED ORDER — ALPRAZOLAM 0.25 MG PO TABS: 0.2500 mg | ORAL_TABLET | Freq: Three times a day (TID) | ORAL | 5 refills | Status: DC | PRN

## 2016-03-28 NOTE — Assessment & Plan Note (Signed)
Mild to mod chronic, for xanax prn,  to f/u any worsening symptoms or concerns

## 2016-03-28 NOTE — Progress Notes (Signed)
Subjective:    Patient ID: Karina Andrews, female    DOB: Sep 21, 1930, 83 y.o.   MRN: [id number]  HPI  Here to f/u recent hospn Jan 13-14 with anemia related to UGI bleeding/melanotic stools, intermittently for several wks.  There was a long discussion with both patient and her daughter at bedside, given patient's poor overall functional status-frailty and underlying severe AS, COPD-they did not want to pursue endoscopic evaluation. Hospice is already following patient at ALF- it was agreed to just transfuse 2 units of PRBC, posttransfusion hemoglobin is stable at 8.1. To continue PPI and felt appropriate to consider transitioning to residential hospice/comfort measures in the new future if GI bleeding becomes more brisk or patient develops severe anemia.  Has been deemed non surgical candidate for severe AS. Living at [facility].  Denies worsening depressive symptoms, suicidal ideation, or panic; but has ongoing anxiety, much increased recently, and atarax not working.  Denies worsening reflux, abd pain, dysphagia, n/v, bowel change or blood.  Pt denies chest pain, increased sob or doe, wheezing, orthopnea, PND, increased LE swelling, palpitations, or syncope. Past Medical History:  Diagnosis Date  . Alcohol abuse   . Anemia   . Anxiety   . Aortic stenosis    moderate on echo 09/2013  . Cervical spondylosis with myelopathy 01/2009   s/p decompression  . COPD (chronic obstructive pulmonary disease) (HCC)   . Dementia 09/06/2015  . Depression   . Fall at home 05/2015  . Heart murmur   . History of blood transfusion 01/02/2016   "anemia"  . Hypertension   . On home oxygen therapy    "2L; 24/7" (01/02/2016)  . Osteoporosis    (R) hip fx 11/2009 & (L) hip fx 2006  . Speech impediment    present for >9 months; no one has really been able to discern an etiology/daughter/notes 01/01/2016   Past Surgical History:  Procedure Laterality Date  . BACK SURGERY    . BLADDER SUSPENSION     "unsuccessful"  . CATARACT EXTRACTION W/ INTRAOCULAR LENS  IMPLANT, BILATERAL Bilateral 06/06/13 - 2016   "right - left"  . FRACTURE SURGERY    . HIP FRACTURE SURGERY Left 1980s  . LAPAROSCOPIC CHOLECYSTECTOMY  1995  . ORIF HIP FRACTURE Right 11/2009   Done by Dr. Turner Daniels  . POSTERIOR LAMINECTOMY / DECOMPRESSION CERVICAL SPINE  01/2009   Done by Dr. Danielle Dess  . WRIST FRACTURE SURGERY Bilateral    "one in [place]; one [place]"    reports that she quit smoking about 6 months ago. Her smoking use included Cigarettes. She has a 31.50 pack-year smoking history. She has never used smokeless tobacco. She reports that she drinks alcohol. She reports that she does not use drugs. family history includes Heart attack in her father. Allergies  Allergen Reactions  . Ativan [Lorazepam] Other (See Comments)    Causes increased confusion/AMS   Current Outpatient Prescriptions on File Prior to Visit  Medication Sig Dispense Refill  . acetaminophen (TYLENOL) 500 MG tablet Take 500 mg by mouth every 8 (eight) hours as needed.    Marland Kitchen albuterol (VENTOLIN HFA) 108 (90 Base) MCG/ACT inhaler Inhale 2 puffs into the lungs every 6 (six) hours as needed for wheezing or shortness of breath. 18 g 11  . benzonatate (TESSALON) 100 MG capsule Take 1 capsule by mouth 2 (two) times daily as needed for cough.   0  . budesonide-formoterol (SYMBICORT) 160-4.5 MCG/ACT inhaler Inhale 2 puffs into the lungs 2 (  two) times daily. 1 Inhaler 0  . docusate sodium (COLACE) 100 MG capsule Take 1 capsule (100 mg total) by mouth 2 (two) times daily. 60 capsule 0  . ferrous sulfate (FERROUSUL) 325 (65 FE) MG tablet Take 1 tablet (325 mg total) by mouth 2 (two) times daily with a meal. 30 tablet 0  . furosemide (LASIX) 20 MG tablet Take 1 tablet (20 mg total) by mouth daily. 90 tablet 3  . guaifenesin (ROBITUSSIN) 100 MG/5ML syrup Take 200 mg by mouth 3 (three) times daily as needed for cough.    . hydrOXYzine (ATARAX/VISTARIL) 50 MG tablet 1/2  tablet by mouth three times daily as needed for anxiety (Patient taking differently: Take 25 mg by mouth 3 (three) times daily as needed for anxiety. ) 45 tablet 3  . ipratropium-albuterol (DUONEB) 0.5-2.5 (3) MG/3ML SOLN Take 3 mLs by nebulization every 6 (six) hours as needed (if inhaler does not resolve wheezing). 360 mL 0  . losartan (COZAAR) 50 MG tablet Take 1 tablet (50 mg total) by mouth daily. 90 tablet 3  . nicotine (NICODERM CQ - DOSED IN MG/24 HOURS) 21 mg/24hr patch Place 21 mg onto the skin daily.    . OXYGEN Inhale 2 L/min into the lungs continuous.    . pantoprazole (PROTONIX) 40 MG tablet Take 1 tablet (40 mg total) by mouth daily. 30 tablet 0  . polyethylene glycol powder (GLYCOLAX/MIRALAX) powder Take 17 g by mouth daily. 3350 g 5  . rosuvastatin (CRESTOR) 10 MG tablet Take 1 tablet (10 mg total) by mouth daily. 90 tablet 3   No current facility-administered medications on file prior to visit.    Review of Systems  Constitutional: Negative for unusual diaphoresis or night sweats HENT: Negative for ear swelling or discharge Eyes: Negative for worsening visual haziness  Respiratory: Negative for choking and stridor.   Gastrointestinal: Negative for distension or worsening eructation Genitourinary: Negative for retention or change in urine volume.  Musculoskeletal: Negative for other MSK pain or swelling Skin: Negative for color change and worsening wound Neurological: Negative for tremors and numbness other than noted  Psychiatric/Behavioral: Negative for decreased concentration or agitation other than above All other system neg per pt     Objective:   Physical Exam There were no vitals taken for this visit. VS noted, on home o2 Constitutional: Pt appears in no apparent distress HENT: Head: NCAT.  Right Ear: External ear normal.  Left Ear: External ear normal.  Eyes: . Pupils are equal, round, and reactive to light. Conjunctivae and EOM are normal Neck: Normal range  of motion. Neck supple.  Cardiovascular: Normal rate and regular rhythm.   Pulmonary/Chest: Effort normal and breath sounds decreased without rales or wheezing.  Abd:  Soft, NT, ND, + BS Neurological: Pt is alert. Not confused , motor grossly intact Skin: Skin is warm. No rash, no LE edema Psychiatric: Pt behavior is normal. No agitation. 1-2+ nervous No other new exam findings    Assessment & Plan:

## 2016-03-28 NOTE — Patient Instructions (Signed)
OK to stop the hydroxyzine for nerves  Please take all new medication as prescribed - the xanax 0.25 mg three time per day as needed  Please continue all other medications as before, and refills have been done if requested.  Please have the pharmacy call with any other refills you may need.  Please keep your appointments with your specialists as you may have planned, and Hospice  Please go to the LAB in the Basement (turn left off the elevator) for the tests to be done today  Please return to the LAB every 2 weeks for a repeat blood count  You will be contacted by phone if any changes need to be made immediately.  Otherwise, you will receive a letter about your results with an explanation, but please check with MyChart first.  Please remember to sign up for MyChart if you have not done so, as this will be important to you in the future with finding out test results, communicating by private email, and scheduling acute appointments online when needed.  Please return in 3 months, or sooner if needed

## 2016-03-28 NOTE — Assessment & Plan Note (Addendum)
Severe recent post transfusion, for f/u lab today, and repeat every 2 wks; pt currently under hospice with weekly visits, d/w pt to consider stopping labs and comfort care/hospice  Note:  Total time for pt hx, exam, review of record with pt in the room, determination of diagnoses and plan for further eval and tx is > 40 min, with over 50% spent in coordination and counseling of patient

## 2016-03-28 NOTE — Assessment & Plan Note (Signed)
stable overall by history and exam, recent data reviewed with pt, and pt to continue medical treatment as before,  to f/u any worsening symptoms or concerns @LASTSAO2(3)@  

## 2016-04-09 IMAGING — CR DG CHEST 2V
2 series · 2 of 2 positions shown · non-contrast
Comparison: 04/11/2015

CLINICAL DATA: Fall today with right-sided anterior pleuritic chest
pain and rib pain.

EXAM:
CHEST - 2 VIEW

[w chest lat]
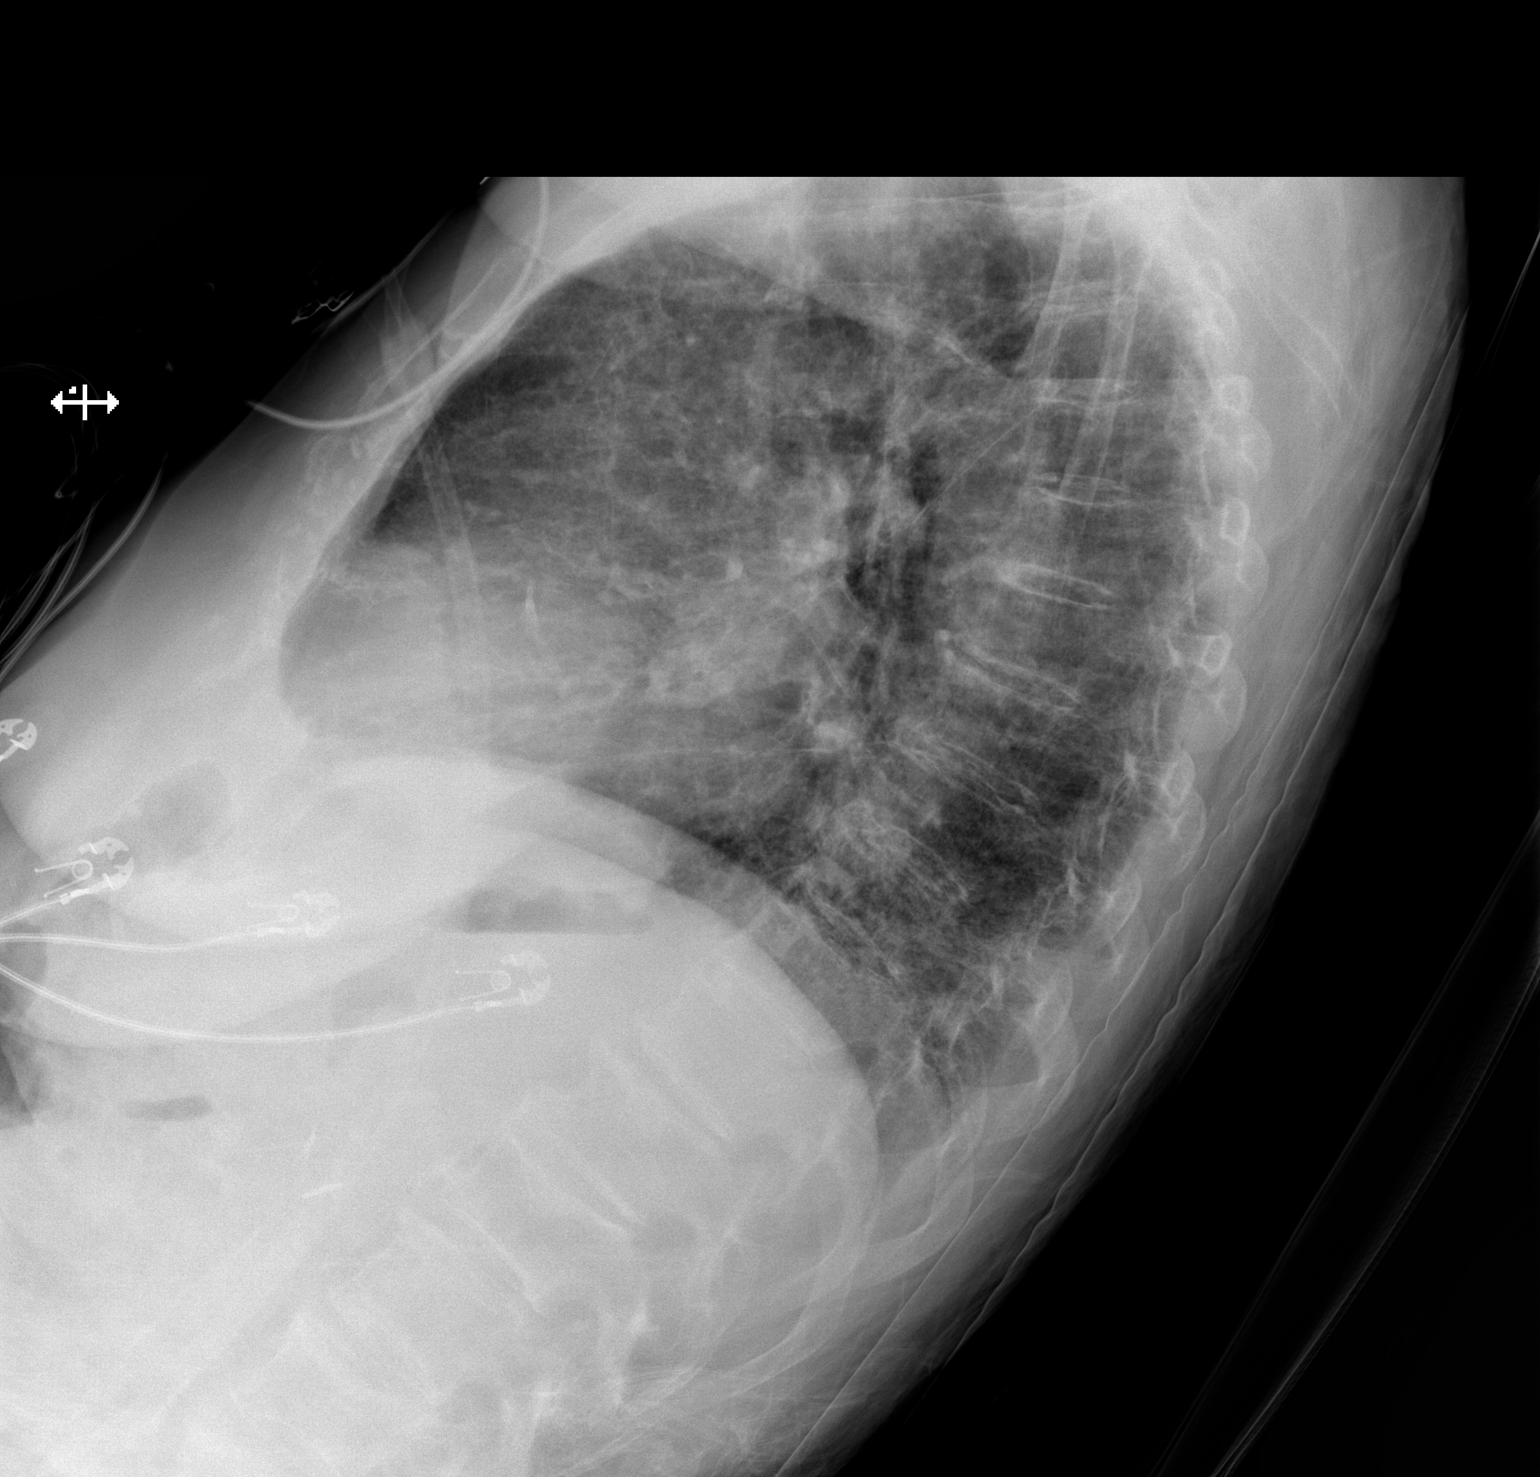

[x chest ap]
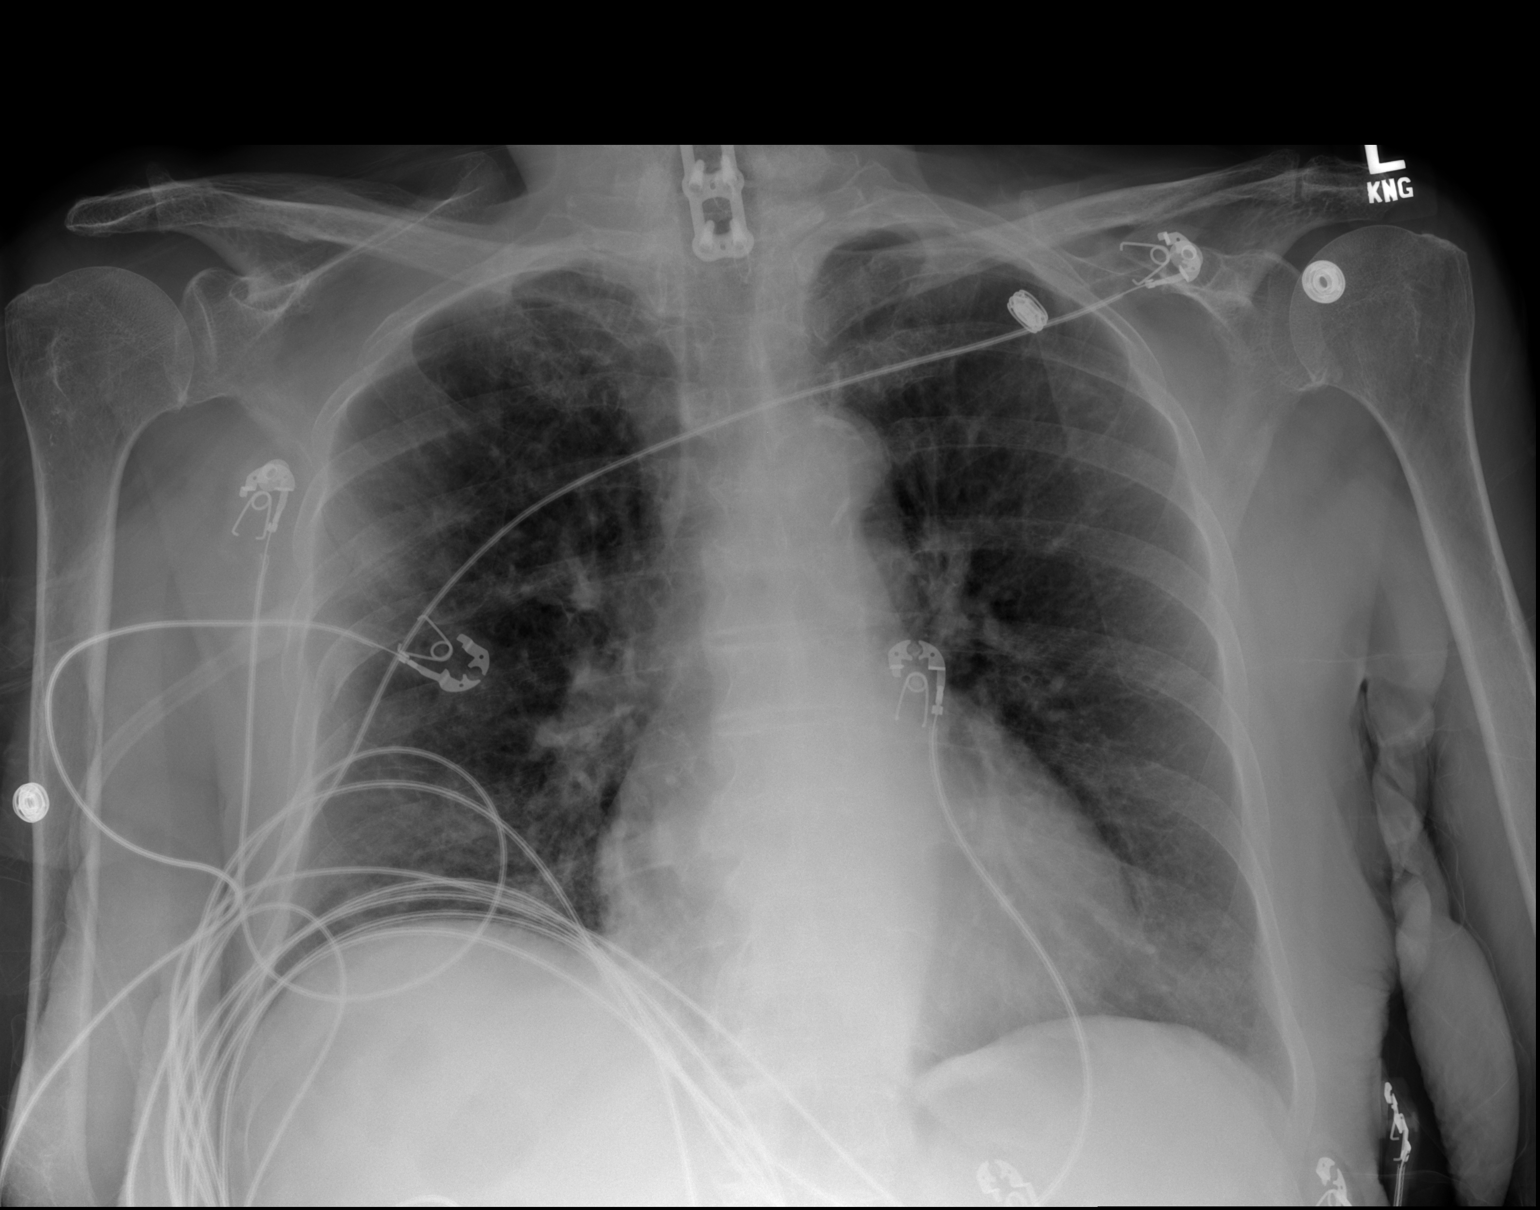

[2 of 2 positions shown; findings below may reference images not displayed]

FINDINGS: Mildly displaced acute fracture is identified of the lateral right
sixth rib. There may also be a fracture of the adjacent fifth rib.
No pneumothorax identified. No edema or pleural fluid. The heart
size and mediastinal contours are normal.
IMPRESSION: At least one acute fracture is identified of the right sixth rib.
There may be an adjacent fifth rib fracture. No pneumothorax
identified.

## 2016-04-11 ENCOUNTER — Other Ambulatory Visit (INDEPENDENT_AMBULATORY_CARE_PROVIDER_SITE_OTHER)

## 2016-04-11 DIAGNOSIS — D500 Iron deficiency anemia secondary to blood loss (chronic): Secondary | ICD-10-CM

## 2016-04-11 LAB — CBC WITH DIFFERENTIAL/PLATELET
Basophils Absolute: 0 10*3/uL (ref 0.0–0.1)
Basophils Relative: 0.5 % (ref 0.0–3.0)
EOS PCT: 3.4 % (ref 0.0–5.0)
Eosinophils Absolute: 0.2 10*3/uL (ref 0.0–0.7)
HCT: 38.2 % (ref 36.0–46.0)
Hemoglobin: 12.4 g/dL (ref 12.0–15.0)
LYMPHS ABS: 1.1 10*3/uL (ref 0.7–4.0)
Lymphocytes Relative: 18.9 % (ref 12.0–46.0)
MCHC: 32.4 g/dL (ref 30.0–36.0)
MCV: 85.1 fl (ref 78.0–100.0)
MONO ABS: 0.6 10*3/uL (ref 0.1–1.0)
Monocytes Relative: 9.5 % (ref 3.0–12.0)
NEUTROS PCT: 67.7 % (ref 43.0–77.0)
Neutro Abs: 4 10*3/uL (ref 1.4–7.7)
PLATELETS: 153 10*3/uL (ref 150.0–400.0)
RBC: 4.5 Mil/uL (ref 3.87–5.11)
RDW: 14.9 % (ref 11.5–15.5)
WBC: 5.9 10*3/uL (ref 4.0–10.5)

## 2016-04-12 IMAGING — DX DG CHEST 2V
2 series · 2 of 2 positions shown · non-contrast
Comparison: Radiograph yesterday.

CLINICAL DATA: Re-evaluate right rib fractures post new fall
tonight in her hospital room.

EXAM:
CHEST  2 VIEW

[chest lat]
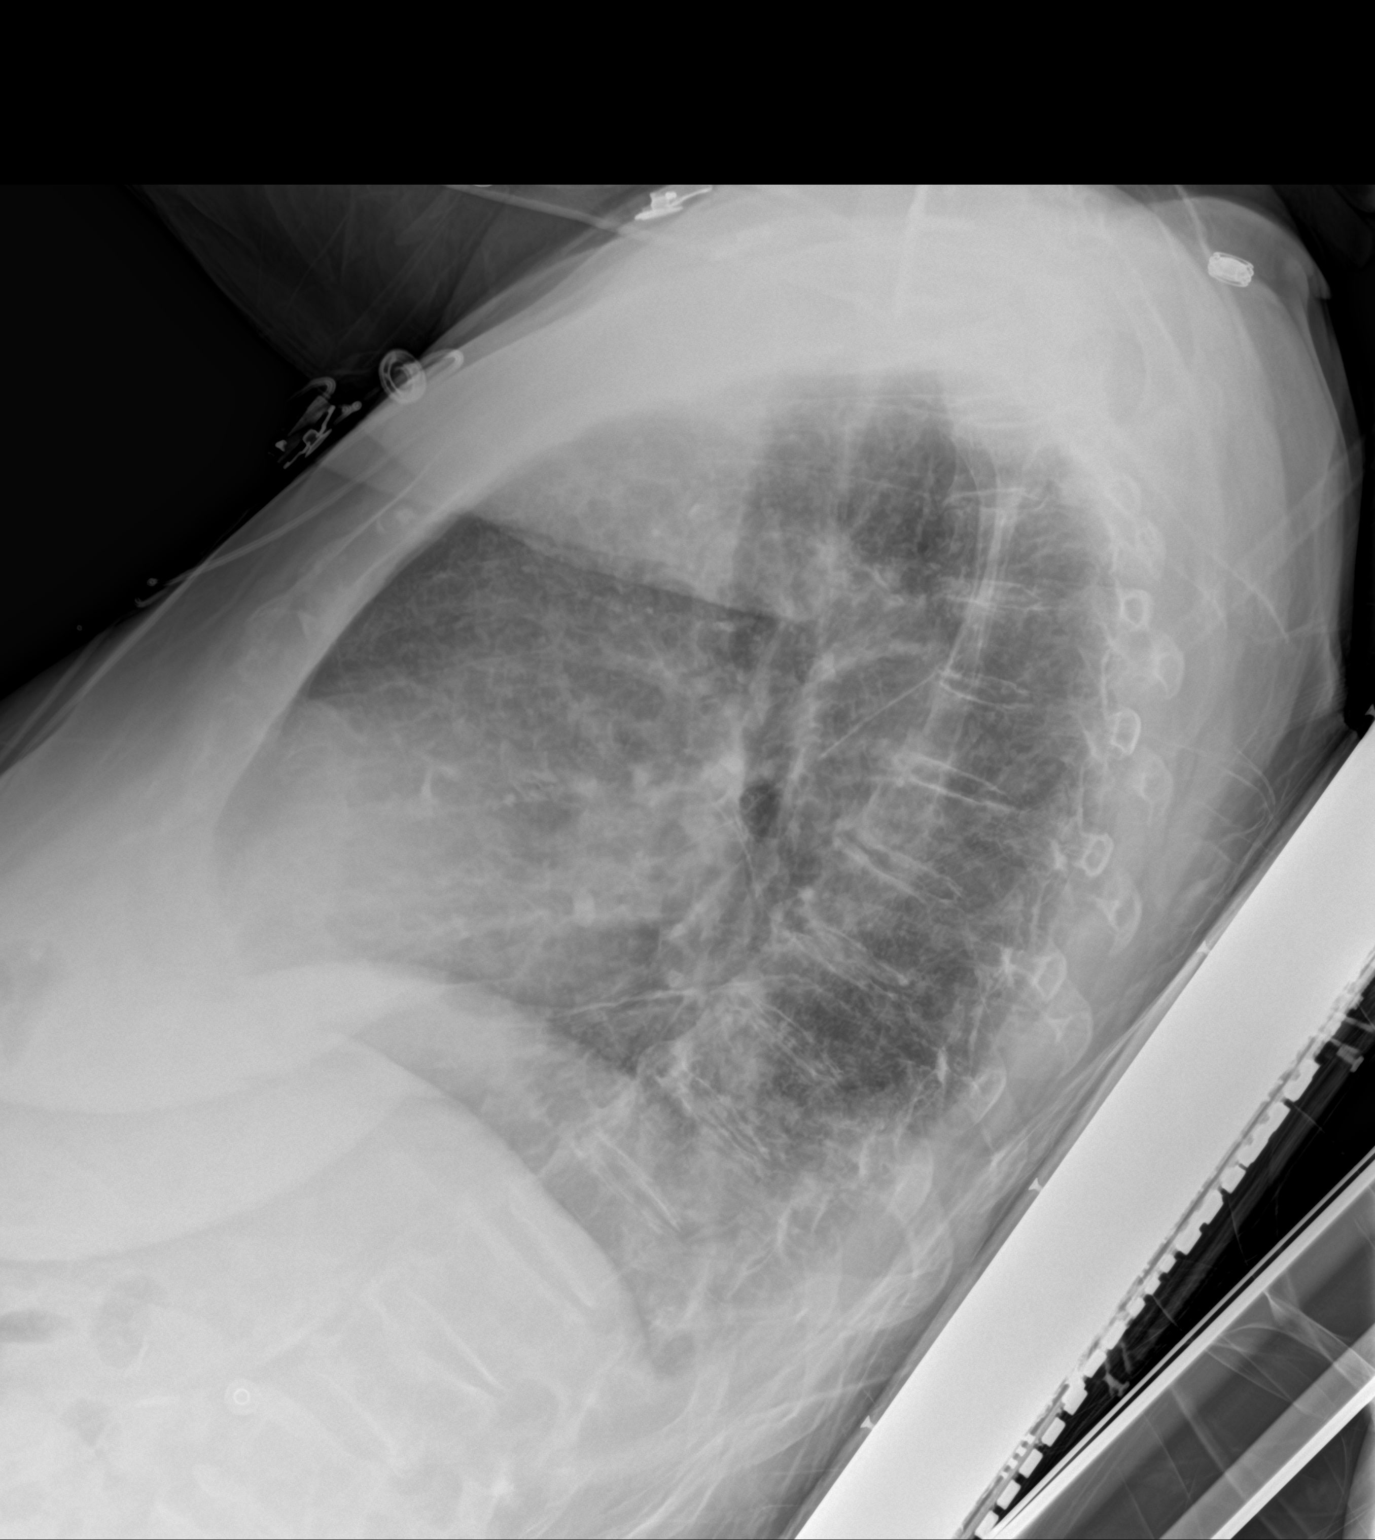

[chest ap]
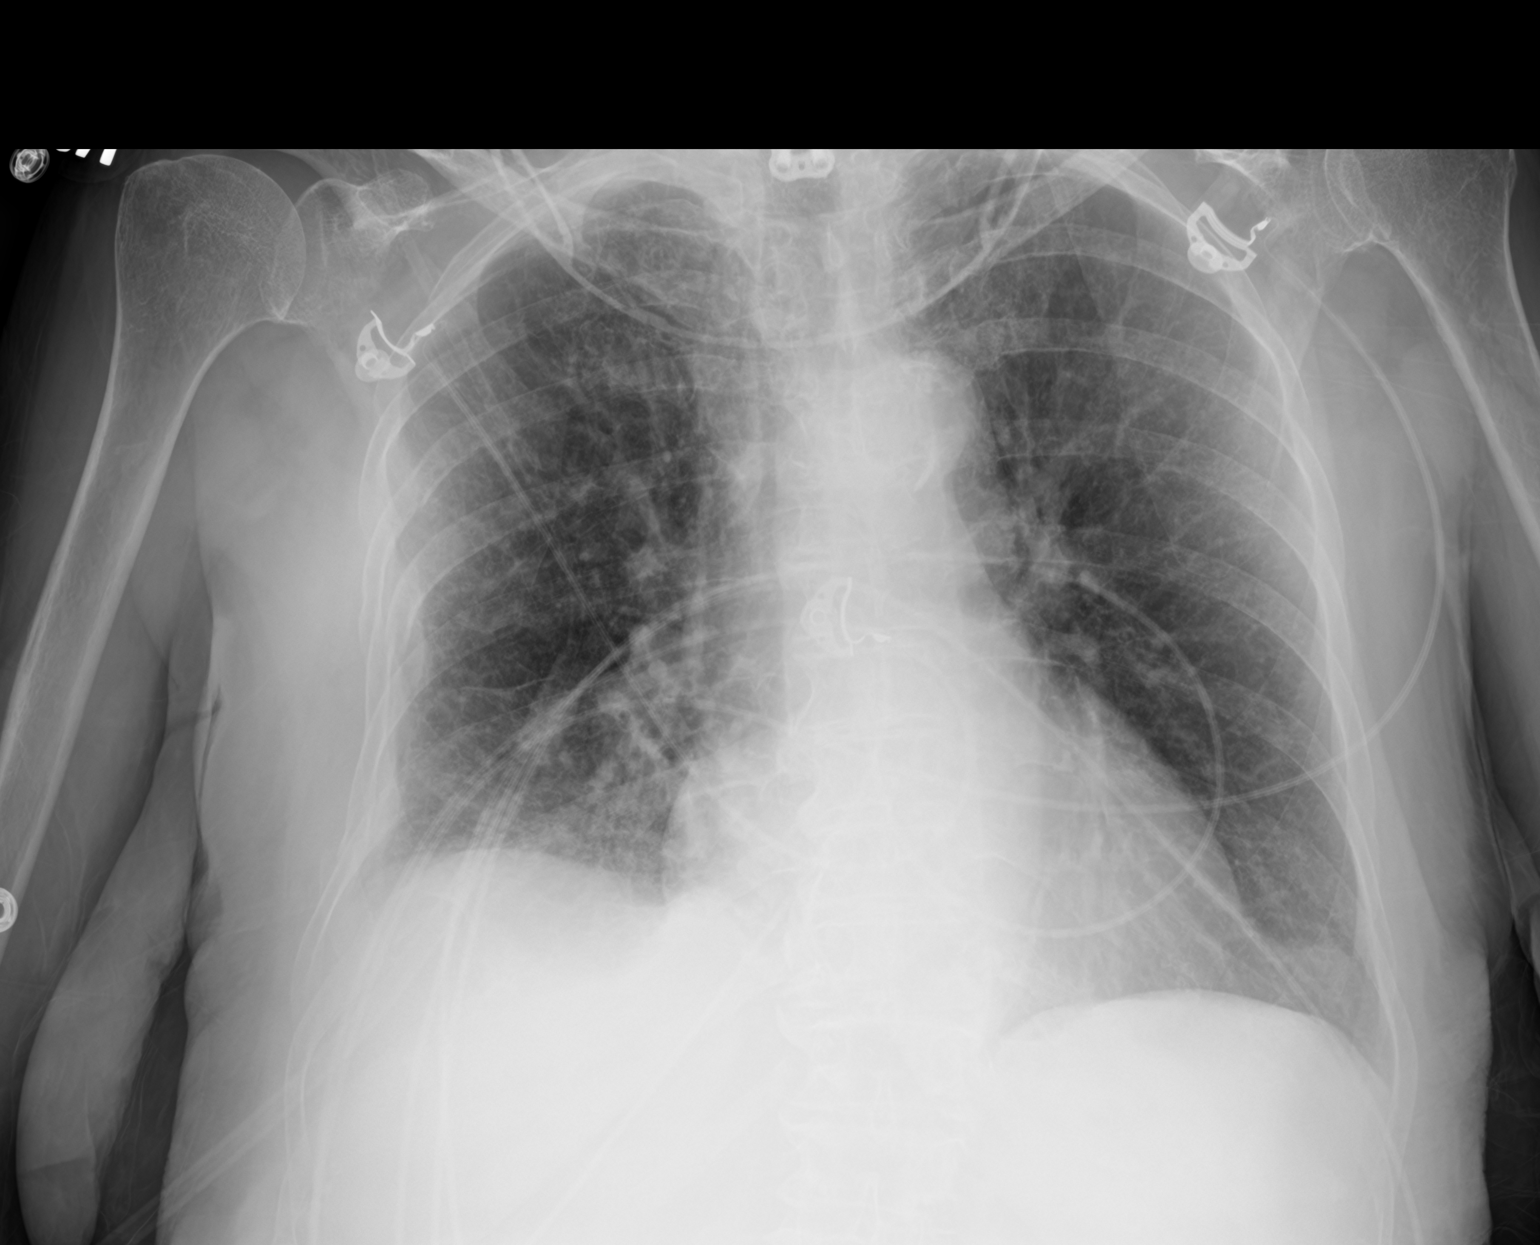

[2 of 2 positions shown; findings below may reference images not displayed]

FINDINGS: The fourth through seventh right lateral rib fractures are unchanged
in alignment. No evidence of new fracture since prior exam. No
pneumothorax. Cardiomediastinal contours are unchanged. Decreased
vascular congestion from prior. Unchanged right lung base
atelectasis. No definite pleural effusion.
IMPRESSION: 1. Unchanged right rib fractures.
2. Unchanged right basilar atelectasis.
3. Decreased vascular congestion.  No new abnormality is seen.

## 2016-04-15 ENCOUNTER — Telehealth: Payer: Self-pay | Admitting: Emergency Medicine

## 2016-04-15 NOTE — Telephone Encounter (Signed)
Hospice called and asked that you give her a call back. She has some questions about patient being admitted to hospice. Please advise thanks.

## 2016-04-16 ENCOUNTER — Encounter: Payer: Self-pay | Admitting: Internal Medicine

## 2016-04-16 ENCOUNTER — Telehealth: Payer: Self-pay | Admitting: Internal Medicine

## 2016-04-16 NOTE — Telephone Encounter (Signed)
Please fax labs once resulted to fax number [phone]

## 2016-04-17 NOTE — Telephone Encounter (Signed)
Ok for Karina Andrews to assist please, ok to fax most recent lab

## 2016-04-17 NOTE — Telephone Encounter (Signed)
Routing to dr john, fyi 

## 2016-04-18 ENCOUNTER — Encounter: Payer: Self-pay | Admitting: Internal Medicine

## 2016-04-18 DIAGNOSIS — R05 Cough: Secondary | ICD-10-CM | POA: Diagnosis not present

## 2016-04-18 NOTE — Telephone Encounter (Signed)
Spoke with Dewayne HatchAnn from Hospice. She would like to know if Pt could have blood work done at the facility. Being a hospice pt, they do not typically have them go to the office to have blood work. She feels that pt does not realize how sick she is. Pt has told her that she was going to stop the blood transfusion, which is the reasoning to having the blood work done. Hospice is able to do blood with where the pt lives so the pt does not have to come to the office to have it done. She states that the next day the patient is "out of it all day" because it took so much for her to come into the office to get blood drawn. Please advise on your thoughts.

## 2016-04-18 NOTE — Telephone Encounter (Signed)
Results have been faxed

## 2016-04-18 NOTE — Telephone Encounter (Signed)
I am not sure about what is meant by "have blood done at the facility" since [facility] is considered a facility as well; I presume this means where the patient is living at this time  If this is true, then ok for labs to be done there per hospice; last cbc was normal and no change  I believe we had ordered once monthly CBC in perpetuity (meaning long term), thanks

## 2016-04-19 ENCOUNTER — Telehealth: Payer: Self-pay

## 2016-04-19 MED ORDER — LOPERAMIDE HCL 2 MG PO CAPS: ORAL_CAPSULE | ORAL | 0 refills | Status: DC

## 2016-04-19 NOTE — Telephone Encounter (Signed)
rx faxed to [facility]isted [facility].

## 2016-04-19 NOTE — Telephone Encounter (Signed)
OK to administer imodium.

## 2016-04-19 NOTE — Telephone Encounter (Signed)
Imodium rx printed.

## 2016-04-19 NOTE — Telephone Encounter (Signed)
Karina MalkinMary Andrews with [facility]isted [facility]  Pt has had Loose stools x 3.  Requesting order to administer anti diarrheal.  No fever at this time. Pt does have a cough but the chest xray was negative.   [phone] is fax number to the ATTN: Karina MalkinMary Andrews

## 2016-04-21 NOTE — Telephone Encounter (Signed)
LVM informing Ann of MDs response.

## 2016-04-23 ENCOUNTER — Telehealth: Payer: Self-pay | Admitting: Internal Medicine

## 2016-04-23 NOTE — Telephone Encounter (Signed)
Pt daughter called in and said that she is dropping of paper work to move mother to skilled nursing.  She needs it signed and sent by tomorrow  .  She would like to pick it up.

## 2016-04-28 ENCOUNTER — Encounter: Payer: Self-pay | Admitting: Internal Medicine

## 2016-04-28 ENCOUNTER — Non-Acute Institutional Stay (SKILLED_NURSING_FACILITY): Payer: Medicare Other | Admitting: Internal Medicine

## 2016-04-28 DIAGNOSIS — J9611 Chronic respiratory failure with hypoxia: Secondary | ICD-10-CM

## 2016-04-28 DIAGNOSIS — R471 Dysarthria and anarthria: Secondary | ICD-10-CM

## 2016-04-28 DIAGNOSIS — I350 Nonrheumatic aortic (valve) stenosis: Secondary | ICD-10-CM

## 2016-04-28 DIAGNOSIS — M5000 Cervical disc disorder with myelopathy, unspecified cervical region: Secondary | ICD-10-CM | POA: Diagnosis not present

## 2016-04-28 DIAGNOSIS — D500 Iron deficiency anemia secondary to blood loss (chronic): Secondary | ICD-10-CM | POA: Diagnosis not present

## 2016-04-28 NOTE — Progress Notes (Signed)
History and Physical      Location:  [facility] Room Number: N31 Place of Service:  SNF (31)  PCP: Murray Hodgkins, MD Patient Care Team: Kimber Relic, MD as PCP - General (Internal Medicine) Barbaraann Share, MD as Consulting Physician (Pulmonary Disease) Gean Birchwood, MD as Consulting Physician (Orthopedic Surgery) Barnett Abu, MD as Consulting Physician (Neurosurgery)  Extended Emergency Contact Information Primary Emergency Contact: Sandi Mariscal Address: [address]          [place], [place] Phone: [phone] Relation: Daughter Secondary Emergency Contact: Theresia Lo [place] Home Phone: [phone] Relation: Other  Code Status: DNR Goals of Care: Advanced Directive information Advanced Directives 04/28/2016  Does Patient Have a Medical Advance Directive? Yes  Type of Estate agent of [place];Living will;Out of facility DNR (pink MOST or yellow form)  Does patient want to make changes to medical advance directive? -  Copy of Healthcare Power of Attorney in Chart? Yes  Would patient like information on creating a medical advance directive? -  Pre-existing out of facility DNR order (yellow form or pink MOST form) Yellow form placed in chart (order not valid for inpatient use)      Chief Complaint  Patient presents with  . New Admit To SNF    admitted 04/24/16, transfered from AL, Hospice patient    HPI: Patient is a 83 y.o. female seen today for admission on 04/24/16 to [facility] by transfer from the AL area. She is having more difficulty with breathing, needs more assistance, and has fallen a couple of times recently. Staff felt she needs more help than is normal for AL resident. She is on Hospice due to the severity of her lung disease. She is O2 dependent.   She rides a motorized chair and cannot walk any significant distance even in her room.  There is a  significant speech disorder that hinders communications.She says she has not had a stroke. MRI and CT brain in 2017 showed cerebral atrophy and SVD.  Other problems include aortic stenosis, cognitive issues, HTN, depression, cervical spondylosis with myelopathy, OP, and anemia.  Past Medical History:  Diagnosis Date  . Alcohol abuse   . Anemia   . Anxiety   . Aortic stenosis    moderate on echo 09/2013  . Cervical spondylosis with myelopathy 01/2009   s/p decompression  . COPD (chronic obstructive pulmonary disease) (HCC)   . Dementia 09/06/2015  . Depression   . Fall at home 05/2015  . Heart murmur   . History of blood transfusion 01/02/2016   "anemia"  . Hypertension   . On home oxygen therapy    "2L; 24/7" (01/02/2016)  . Osteoporosis    (R) hip fx 11/2009 & (L) hip fx 2006  . Speech impediment    present for >9 months; no one has really been able to discern an etiology/daughter/notes 01/01/2016   Past Surgical History:  Procedure Laterality Date  . BACK SURGERY    . BLADDER SUSPENSION     "unsuccessful"  . CATARACT EXTRACTION W/ INTRAOCULAR LENS  IMPLANT, BILATERAL Bilateral 06/06/13 - 2016   "right - left"  . FRACTURE SURGERY    . HIP FRACTURE SURGERY Left 1980s  . LAPAROSCOPIC CHOLECYSTECTOMY  1995  . ORIF HIP FRACTURE Right 11/2009   Done by Dr. Turner Daniels  . POSTERIOR LAMINECTOMY / DECOMPRESSION CERVICAL SPINE  01/2009   Done by Dr. Danielle Dess  . WRIST  FRACTURE [place]    [place]"    reports that she quit smoking about 7 months ago. Her smoking use included Cigarettes. She has a 31.50 pack-year smoking history. She has never used smokeless tobacco. She reports that she drinks alcohol. She reports that she does not use drugs. Social History   Social History  . Marital status: Divorced    Spouse name: N/A  . Number of children: 2  . Years of education: N/A   Occupational History  . Retired-travel agent    Social History Main Topics  .  Smoking status: Former Smoker    Packs/day: 0.50    Years: 63.00    Types: Cigarettes    Quit date: 09/10/2015  . Smokeless tobacco: Never Used  . Alcohol use 0.0 oz/week     Comment: 01/02/2016 "last drink was before 09/10/2015  . Drug use: No  . Sexual activity: No   Other Topics Concern  . Not on file   Social History Narrative   [facility] transfer to skill unit 04/24/16   Married   Former smoker-stopped 09/10/15   Alcohol stopped 01/02/16   POA, Living Will, DNR      Family History  Problem Relation Age of Onset  . Heart attack Father     Health Maintenance  Topic Date Due  . TETANUS/TDAP  04/16/1949  . PNA vac Low Risk Adult (2 of 2 - PCV13) 09/18/2014  . INFLUENZA VACCINE  Completed  . DEXA SCAN  Completed    Allergies  Allergen Reactions  . Ativan [Lorazepam] Other (See Comments)    Causes increased confusion/AMS    Allergies as of 04/28/2016      Reactions   Ativan [lorazepam] Other (See Comments)   Causes increased confusion/AMS      Medication List       Accurate as of 04/28/16  5:44 PM. Always use your most recent med list.          acetaminophen 500 MG tablet Commonly known as:  TYLENOL Take 500 mg by mouth. Take 2 tablets as needed up to 3 times daily   ALPRAZolam 0.5 MG tablet Commonly known as:  XANAX Take 0.5 mg by mouth. Take one tablet 3 times a day. Take one every 6 hours as needed for anxiety   benzonatate 100 MG capsule Commonly known as:  TESSALON Take 1 capsule by mouth 3 (three) times daily as needed for cough.   docusate sodium 100 MG capsule Commonly known as:  COLACE Take 1 capsule (100 mg total) by mouth 2 (two) times daily.   ferrous sulfate 325 (65 FE) MG tablet Commonly known as:  FERROUSUL Take 1 tablet (325 mg total) by mouth 2 (two) times daily with a meal.   furosemide 20 MG tablet Commonly known as:  LASIX Take 1 tablet (20 mg total) by mouth daily.   guaifenesin 100 MG/5ML syrup Commonly  known as:  ROBITUSSIN Take 200 mg by mouth. Take 10 ml every 4 hours as needed for cough   ipratropium-albuterol 0.5-2.5 (3) MG/3ML Soln Commonly known as:  DUONEB Take 3 mLs by nebulization. Use nebulizer four times daily   loperamide 2 MG capsule Commonly known as:  IMODIUM 1 tab po bid prn diarrhea   losartan 50 MG tablet Commonly known as:  COZAAR Take 1 tablet (50 mg total) by mouth daily.   Melatonin 5 MG Tabs Take by mouth. Take one tablet every night at  bedtime   nicotine 21 mg/24hr patch Commonly known as:  NICODERM CQ - dosed in mg/24 hours Place 21 mg onto the skin daily.   OXYGEN Inhale 2 L/min into the lungs continuous.   pantoprazole 40 MG tablet Commonly known as:  PROTONIX Take 1 tablet (40 mg total) by mouth daily.   polyethylene glycol powder powder Commonly known as:  GLYCOLAX/MIRALAX Take 17 g by mouth daily.   predniSONE 5 MG tablet Commonly known as:  DELTASONE Take 5 mg by mouth. Take one tablet once a day   rosuvastatin 10 MG tablet Commonly known as:  CRESTOR Take 1 tablet (10 mg total) by mouth daily.       Review of Systems  Constitutional: Negative for activity change, appetite change, chills, diaphoresis, fatigue, fever and unexpected weight change.  HENT: Negative for congestion, ear discharge, ear pain, hearing loss, postnasal drip, rhinorrhea, sinus pressure, sneezing, sore throat, tinnitus, trouble swallowing and voice change.   Eyes: Negative for pain, discharge, redness, itching and visual disturbance.  Respiratory: Positive for cough and shortness of breath. Negative for choking, chest tightness and wheezing.        COPD with O2 dependency  Cardiovascular: Negative for chest pain, palpitations and leg swelling.  Gastrointestinal: Negative.  Negative for abdominal distention, abdominal pain, constipation, diarrhea and nausea.  Endocrine: Negative for cold intolerance, heat intolerance, polydipsia, polyphagia and polyuria.    Genitourinary: Negative.  Negative for difficulty urinating, dysuria, flank pain, frequency, hematuria, pelvic pain, urgency and vaginal discharge.  Musculoskeletal: Positive for gait problem. Negative for arthralgias, back pain, myalgias, neck pain and neck stiffness.       Wears Hard collar   Skin: Negative for color change, pallor and rash.  Allergic/Immunologic: Negative.   Neurological: Positive for weakness. Negative for dizziness, tremors, seizures, syncope, numbness and headaches.       Cognitive changes. Speech impediment of unknown etiology.  Hematological: Negative for adenopathy. Does not bruise/bleed easily.       Anemic in Jan 2018 with Hgb 5.9.thought due to UGI bleed. Endoscopy was not done. She was transfused 2 units PRBC. Hgb now normal.   Psychiatric/Behavioral: Negative.  Negative for agitation, behavioral problems, confusion, dysphoric mood, hallucinations, sleep disturbance and suicidal ideas. The patient is not nervous/anxious and is not hyperactive.     Vitals:   04/28/16 1711  BP: 126/70  Pulse: 70  Resp: 18  Temp: 97.6 F (36.4 C)  SpO2: 97%  Weight: 139 lb (63 kg)  Height: 5\' 3"  (1.6 m)   Body mass index is 24.62 kg/m. Physical Exam  Constitutional: She is oriented to person, place, and time. No distress.  Frail elderly   HENT:  Head: Normocephalic.  Right Ear: External ear normal.  Left Ear: External ear normal.  Nose: Nose normal.  Mouth/Throat: Oropharynx is clear and moist. No oropharyngeal exudate.  Eyes: Conjunctivae and EOM are normal. Pupils are equal, round, and reactive to light. Right eye exhibits no discharge. Left eye exhibits no discharge. No scleral icterus.  Neck: No JVD present. No tracheal deviation present. No thyromegaly present.  Hard collar in place.  Cardiovascular: Normal rate, regular rhythm, normal heart sounds and intact distal pulses.  Exam reveals no gallop and no friction rub.   No murmur heard. Pulmonary/Chest: Effort  normal and breath sounds normal. No respiratory distress. She has no wheezes. She has no rales. She exhibits no tenderness.  Abdominal: Soft. Bowel sounds are normal. She exhibits no distension and no mass. There  is no tenderness. There is no rebound and no guarding.  Musculoskeletal: Normal range of motion. She exhibits no edema or tenderness.  Muscle weakness to lower extremities.   Lymphadenopathy:    She has no cervical adenopathy.  Neurological: She is alert and oriented to person, place, and time. No cranial nerve deficit. Coordination normal.  Speech impaired: hesitant, gutteral, difficult too understand.   Skin: Skin is warm and dry. No rash noted. She is not diaphoretic. No erythema. No pallor.  Psychiatric: She has a normal mood and affect. Her behavior is normal.  Expresses several times that she is angry about her rent move and that she feels like it is motivated by money to the institution.    Labs reviewed: Basic Metabolic Panel:  Recent Labs  40/98/11 1611 03/07/16 2247 03/08/16 0512  NA 142 139 139  K 4.8 4.1 3.8  CL 108 105 106  CO2 28 24 26   GLUCOSE 113* 109* 101*  BUN 22 20 16   CREATININE 0.63 0.70 0.67  CALCIUM 8.8 9.0 8.6*   Liver Function Tests:  Recent Labs  01/01/16 1446 03/07/16 2247 03/08/16 0512  AST 16 38 39  ALT 11 61* 57*  ALKPHOS 88 127* 119  BILITOT 0.2 0.7 0.5  PROT 7.0 6.5 5.9*  ALBUMIN 4.0 3.1* 2.7*   No results for input(s): LIPASE, AMYLASE in the last 8760 hours. No results for input(s): AMMONIA in the last 8760 hours. CBC:  Recent Labs  03/07/16 1611  03/09/16 0602 03/28/16 1715 04/11/16 1506  WBC 8.8  < > 7.5 6.5 5.9  NEUTROABS 7.2  --   --  4.1 4.0  HGB 5.9 Repeated and verified X2.*  < > 8.1* 12.0 12.4  HCT 18.6 Repeated and verified X2.*  < > 25.6* 38.0 38.2  MCV 82.6  < > 85.0 85.1 85.1  PLT 343.0  < > 249 191.0 153.0  < > = values in this interval not displayed. Cardiac Enzymes: No results for input(s): CKTOTAL,  CKMB, CKMBINDEX, TROPONINI in the last 8760 hours. BNP: Invalid input(s): POCBNP No results found for: HGBA1C Lab Results  Component Value Date   TSH 2.01 01/01/2016   Lab Results  Component Value Date   VITAMINB12 984 (H) 11/03/2011   Lab Results  Component Value Date   FOLATE 11.4 12/12/2008   Lab Results  Component Value Date   IRON 7 (L) 03/07/2016   TIBC 498 (H) 01/02/2016   FERRITIN 46.3 01/09/2016    Imaging and Procedures obtained prior to SNF admission: No results found.  Assessment/Plan  1. Chronic respiratory failure with hypoxia (HCC) Continue O2  2. Aortic stenosis, severe stable  3. DEGENERATIVE DISC DISEASE, CERVICAL SPINE, WITH MYELOPATHY I wonder if related to her speech disorder  4. Dysarthria See above  I am unsure how long she is likely to need SNF. It may be possible to strengthen enough to allow her to return to AL, but the more likely course id that she will not improve enough to return to a lower level of care.

## 2016-05-13 DIAGNOSIS — I10 Essential (primary) hypertension: Secondary | ICD-10-CM | POA: Diagnosis not present

## 2016-05-13 LAB — CBC AND DIFFERENTIAL
HEMATOCRIT: 34 % — AB (ref 36–46)
Hemoglobin: 10.6 g/dL — AB (ref 12.0–16.0)
PLATELETS: 163 10*3/uL (ref 150–399)
WBC: 7.3 10^3/mL

## 2016-05-14 ENCOUNTER — Other Ambulatory Visit: Payer: Self-pay | Admitting: *Deleted

## 2016-05-19 ENCOUNTER — Other Ambulatory Visit: Payer: Self-pay | Admitting: *Deleted

## 2016-05-22 ENCOUNTER — Non-Acute Institutional Stay (SKILLED_NURSING_FACILITY): Payer: Medicare Other | Admitting: Nurse Practitioner

## 2016-05-22 ENCOUNTER — Encounter: Payer: Self-pay | Admitting: Nurse Practitioner

## 2016-05-22 DIAGNOSIS — F411 Generalized anxiety disorder: Secondary | ICD-10-CM

## 2016-05-22 DIAGNOSIS — F32A Depression, unspecified: Secondary | ICD-10-CM

## 2016-05-22 DIAGNOSIS — J449 Chronic obstructive pulmonary disease, unspecified: Secondary | ICD-10-CM

## 2016-05-22 DIAGNOSIS — F17200 Nicotine dependence, unspecified, uncomplicated: Secondary | ICD-10-CM | POA: Diagnosis not present

## 2016-05-22 DIAGNOSIS — G4700 Insomnia, unspecified: Secondary | ICD-10-CM

## 2016-05-22 DIAGNOSIS — D500 Iron deficiency anemia secondary to blood loss (chronic): Secondary | ICD-10-CM | POA: Diagnosis not present

## 2016-05-22 DIAGNOSIS — M5000 Cervical disc disorder with myelopathy, unspecified cervical region: Secondary | ICD-10-CM | POA: Diagnosis not present

## 2016-05-22 DIAGNOSIS — I10 Essential (primary) hypertension: Secondary | ICD-10-CM

## 2016-05-22 DIAGNOSIS — F329 Major depressive disorder, single episode, unspecified: Secondary | ICD-10-CM | POA: Diagnosis not present

## 2016-05-22 DIAGNOSIS — K219 Gastro-esophageal reflux disease without esophagitis: Secondary | ICD-10-CM

## 2016-05-22 DIAGNOSIS — K5900 Constipation, unspecified: Secondary | ICD-10-CM | POA: Diagnosis not present

## 2016-05-22 NOTE — Assessment & Plan Note (Signed)
on Tylenol 500mg  2tabs tid prn,

## 2016-05-22 NOTE — Assessment & Plan Note (Signed)
managed with Alprazolam 0.5mg tid and q6h prn,  

## 2016-05-22 NOTE — Assessment & Plan Note (Signed)
Maintained on Tessalon tid prn, Robitussin q4h prn, DuoNeb qid, Prednisone 5mg  qd,

## 2016-05-22 NOTE — Assessment & Plan Note (Signed)
05/22/16 dc'd Nicotine patch

## 2016-05-22 NOTE — Assessment & Plan Note (Signed)
05/19/16 dc MiraLax and Colace-pt's refusal.

## 2016-05-22 NOTE — Assessment & Plan Note (Signed)
05/13/16 Hgb 10.6, continue Fe

## 2016-05-22 NOTE — Progress Notes (Signed)
Location:  [facility] Room Number: 31 Place of Service:  SNF (31) Provider: Mast, Manxie  NP  Murray Hodgkins, MD  Patient Care Team: Kimber Relic, MD as PCP - General (Internal Medicine) Barbaraann Share, MD as Consulting Physician (Pulmonary Disease) Gean Birchwood, MD as Consulting Physician (Orthopedic Surgery) Barnett Abu, MD as Consulting Physician (Neurosurgery)  Extended Emergency Contact Information Primary Emergency Contact: Sandi Mariscal Address: [address]          [place], [place] Phone: [phone] Relation: Daughter Secondary Emergency Contact: Theresia Lo [place] Home Phone: [phone] Relation: Other  Code Status:  DNR Goals of care: Advanced Directive information Advanced Directives 05/22/2016  Does Patient Have a Medical Advance Directive? Yes  Type of Estate agent of [place];Living will;Out of facility DNR (pink MOST or yellow form)  Does patient want to make changes to medical advance directive? No - Patient declined  Copy of Healthcare Power of Attorney in Chart? Yes  Would patient like information on creating a medical advance directive? -  Pre-existing out of facility DNR order (yellow form or pink MOST form) Yellow form placed in chart (order not valid for inpatient use)     Chief Complaint  Patient presents with  . Acute Visit    Medication adjustment    HPI:  Pt is a 83 y.o. female seen today for an acute visit for    Past Medical History:  Diagnosis Date  . Alcohol abuse   . Anemia   . Anxiety   . Aortic stenosis    moderate on echo 09/2013  . Cervical spondylosis with myelopathy 01/2009   s/p decompression  . COPD (chronic obstructive pulmonary disease) (HCC)   . Dementia 09/06/2015  . Depression   . Fall at home 05/2015  . Heart murmur   . History of blood transfusion 01/02/2016   "anemia"  . Hypertension   . On home  oxygen therapy    "2L; 24/7" (01/02/2016)  . Osteoporosis    (R) hip fx 11/2009 & (L) hip fx 2006  . Speech impediment    present for >9 months; no one has really been able to discern an etiology/daughter/notes 01/01/2016   Past Surgical History:  Procedure Laterality Date  . BACK SURGERY    . BLADDER SUSPENSION     "unsuccessful"  . CATARACT EXTRACTION W/ INTRAOCULAR LENS  IMPLANT, BILATERAL Bilateral 06/06/13 - 2016   "right - left"  . FRACTURE SURGERY    . HIP FRACTURE SURGERY Left 1980s  . LAPAROSCOPIC CHOLECYSTECTOMY  1995  . ORIF HIP FRACTURE Right 11/2009   Done by Dr. Turner Daniels  . POSTERIOR LAMINECTOMY / DECOMPRESSION CERVICAL SPINE  01/2009   Done by Dr. Danielle Dess  . WRIST FRACTURE SURGERY Bilateral    "one in [place]; one [place]"    Allergies  Allergen Reactions  . Ativan [Lorazepam] Other (See Comments)    Causes increased confusion/AMS    Allergies as of 05/22/2016      Reactions   Ativan [lorazepam] Other (See Comments)   Causes increased confusion/AMS      Medication List       Accurate as of 05/22/16  1:11 PM. Always use your most recent med list.          acetaminophen 500 MG tablet Commonly known as:  TYLENOL Take 500 mg by mouth. Take 2 tablets as needed up to 3 times daily   ALPRAZolam 0.5  MG tablet Commonly known as:  XANAX Take 0.5 mg by mouth. Take one tablet 3 times a day. Take one every 6 hours as needed for anxiety   benzonatate 100 MG capsule Commonly known as:  TESSALON Take 1 capsule by mouth 3 (three) times daily as needed for cough.   ferrous sulfate 325 (65 FE) MG tablet Commonly known as:  FERROUSUL Take 1 tablet (325 mg total) by mouth 2 (two) times daily with a meal.   furosemide 20 MG tablet Commonly known as:  LASIX Take 20 mg by mouth daily.   guaifenesin 100 MG/5ML syrup Commonly known as:  ROBITUSSIN Take 200 mg by mouth. Take 10 ml every 4 hours as needed for cough   ipratropium-albuterol 0.5-2.5 (3) MG/3ML Soln Commonly  known as:  DUONEB Take 3 mLs by nebulization. Use nebulizer four times daily   loperamide 2 MG capsule Commonly known as:  IMODIUM 1 tab po bid prn diarrhea   losartan 50 MG tablet Commonly known as:  COZAAR Take 1 tablet (50 mg total) by mouth daily.   Melatonin 5 MG Tabs Take by mouth. Take one tablet every night at bedtime   OXYGEN Inhale 2 L/min into the lungs continuous.   pantoprazole 40 MG tablet Commonly known as:  PROTONIX Take 1 tablet (40 mg total) by mouth daily.   predniSONE 5 MG tablet Commonly known as:  DELTASONE Take 5 mg by mouth. Take one tablet once a day       Review of Systems  Immunization History  Administered Date(s) Administered  . Influenza Split 11/03/2011, 11/17/2013, 11/25/2014  . Influenza-Unspecified 12/12/2015, 12/19/2015  . PPD Test 06/06/2015, 06/20/2015  . Pneumococcal Polysaccharide-23 09/17/2013   Pertinent  Health Maintenance Due  Topic Date Due  . PNA vac Low Risk Adult (2 of 2 - PCV13) 09/18/2014  . INFLUENZA VACCINE  Completed  . DEXA SCAN  Completed   Fall Risk  04/11/2015  Falls in the past year? No   Functional Status Survey:    Vitals:   05/22/16 1257  BP: 100/64  Pulse: 96  Resp: 20  Temp: 98 F (36.7 C)  SpO2: 95%  Weight: 136 lb 14.4 oz (62.1 kg)  Height: 5\' 3"  (1.6 m)   Body mass index is 24.25 kg/m. Physical Exam  Labs reviewed:  Recent Labs  03/07/16 1611 03/07/16 2247 03/08/16 0512  NA 142 139 139  K 4.8 4.1 3.8  CL 108 105 106  CO2 28 24 26   GLUCOSE 113* 109* 101*  BUN 22 20 16   CREATININE 0.63 0.70 0.67  CALCIUM 8.8 9.0 8.6*    Recent Labs  01/01/16 1446 03/07/16 2247 03/08/16 0512  AST 16 38 39  ALT 11 61* 57*  ALKPHOS 88 127* 119  BILITOT 0.2 0.7 0.5  PROT 7.0 6.5 5.9*  ALBUMIN 4.0 3.1* 2.7*    Recent Labs  03/07/16 1611  03/09/16 0602 03/28/16 1715 04/11/16 1506 05/13/16  WBC 8.8  < > 7.5 6.5 5.9 7.3  NEUTROABS 7.2  --   --  4.1 4.0  --   HGB 5.9 Repeated and  verified X2.*  < > 8.1* 12.0 12.4 10.6*  HCT 18.6 Repeated and verified X2.*  < > 25.6* 38.0 38.2 34*  MCV 82.6  < > 85.0 85.1 85.1  --   PLT 343.0  < > 249 191.0 153.0 163  < > = values in this interval not displayed. Lab Results  Component Value Date   TSH  2.01 01/01/2016   No results found for: HGBA1C Lab Results  Component Value Date   CHOL 260 (H) 04/11/2015   HDL 92.50 04/11/2015   LDLCALC 148 (H) 04/11/2015   LDLDIRECT 189.2 12/12/2008   TRIG 98.0 04/11/2015   CHOLHDL 3 04/11/2015    Significant Diagnostic Results in last 30 days:  No results found.  Assessment/Plan 1. Essential hypertension   2. COPD GOLD II    3. Constipation, unspecified constipation type     Family/ staff Communication:   Labs/tests ordered:

## 2016-05-22 NOTE — Assessment & Plan Note (Signed)
Controlled, continue Losartan 50mg  qd, Furosemide 20mg  daily,

## 2016-05-22 NOTE — Progress Notes (Signed)
Location:    [facility] Room Number: 11 Place of Service:  SNF (31) Provider: Arna Snipe Tylek Boney NP  Murray Hodgkins, MD  Patient Care Team: Kimber Relic, MD as PCP - General (Internal Medicine) Barbaraann Share, MD as Consulting Physician (Pulmonary Disease) Gean Birchwood, MD as Consulting Physician (Orthopedic Surgery) Barnett Abu, MD as Consulting Physician (Neurosurgery)  Extended Emergency Contact Information Primary Emergency Contact: Sandi Mariscal Address: [address]          [place], [place] Phone: [phone] Relation: Daughter Secondary Emergency Contact: Theresia Lo [place] Home Phone: [phone] Relation: Other  Code Status: DNR Goals of care: Advanced Directive information Advanced Directives 05/22/2016  Does Patient Have a Medical Advance Directive? Yes  Type of Estate agent of [place];Living will;Out of facility DNR (pink MOST or yellow form)  Does patient want to make changes to medical advance directive? No - Patient declined  Copy of Healthcare Power of Attorney in Chart? Yes  Would patient like information on creating a medical advance directive? -  Pre-existing out of facility DNR order (yellow form or pink MOST form) Yellow form placed in chart (order not valid for inpatient use)     Chief Complaint  Patient presents with  . Acute Visit    Medication adjustment    HPI:  Pt is a 83 y.o. female seen today for an acute visit for medication refusal, managing chronic medical conditions.     Hx of HTN, controlled, taking  Losartan 50mg  qd, Furosemide 20mg  daily, OA multiple sties, on Tylenol 500mg  2tabs tid prn, insomnia/anxiety managed with Alprazolam 0.5mg  tid and q6h prn, COPD, maintained on Tessalon tid prn, Robitussin q4h prn, DuoNeb qid, Prednisone 5mg  qd, anemia, taking Fe 325mg  bid, GERD, stable, taking Protonix 40mg  qd  Past Medical History:  Diagnosis Date  .  Alcohol abuse   . Anemia   . Anxiety   . Aortic stenosis    moderate on echo 09/2013  . Cervical spondylosis with myelopathy 01/2009   s/p decompression  . COPD (chronic obstructive pulmonary disease) (HCC)   . Dementia 09/06/2015  . Depression   . Fall at home 05/2015  . Heart murmur   . History of blood transfusion 01/02/2016   "anemia"  . Hypertension   . On home oxygen therapy    "2L; 24/7" (01/02/2016)  . Osteoporosis    (R) hip fx 11/2009 & (L) hip fx 2006  . Speech impediment    present for >9 months; no one has really been able to discern an etiology/daughter/notes 01/01/2016   Past Surgical History:  Procedure Laterality Date  . BACK SURGERY    . BLADDER SUSPENSION     "unsuccessful"  . CATARACT EXTRACTION W/ INTRAOCULAR LENS  IMPLANT, BILATERAL Bilateral 06/06/13 - 2016   "right - left"  . FRACTURE SURGERY    . HIP FRACTURE SURGERY Left 1980s  . LAPAROSCOPIC CHOLECYSTECTOMY  1995  . ORIF HIP FRACTURE Right 11/2009   Done by Dr. Turner Daniels  . POSTERIOR LAMINECTOMY / DECOMPRESSION CERVICAL SPINE  01/2009   Done by Dr. Danielle Dess  . WRIST FRACTURE SURGERY Bilateral    "one in [place]; one [place]"    Allergies  Allergen Reactions  . Ativan [Lorazepam] Other (See Comments)    Causes increased confusion/AMS    Allergies as of 05/22/2016      Reactions   Ativan [lorazepam] Other (See Comments)   Causes increased confusion/AMS  Medication List       Accurate as of 05/22/16  4:37 PM. Always use your most recent med list.          acetaminophen 500 MG tablet Commonly known as:  TYLENOL Take 500 mg by mouth. Take 2 tablets as needed up to 3 times daily   ALPRAZolam 0.5 MG tablet Commonly known as:  XANAX Take 0.5 mg by mouth. Take one tablet 3 times a day. Take one every 6 hours as needed for anxiety   benzonatate 100 MG capsule Commonly known as:  TESSALON Take 1 capsule by mouth 3 (three) times daily as needed for cough.   ferrous sulfate 325 (65 FE) MG  tablet Commonly known as:  FERROUSUL Take 1 tablet (325 mg total) by mouth 2 (two) times daily with a meal.   furosemide 20 MG tablet Commonly known as:  LASIX Take 20 mg by mouth daily.   guaifenesin 100 MG/5ML syrup Commonly known as:  ROBITUSSIN Take 200 mg by mouth. Take 10 ml every 4 hours as needed for cough   ipratropium-albuterol 0.5-2.5 (3) MG/3ML Soln Commonly known as:  DUONEB Take 3 mLs by nebulization. Use nebulizer four times daily   loperamide 2 MG capsule Commonly known as:  IMODIUM 1 tab po bid prn diarrhea   losartan 50 MG tablet Commonly known as:  COZAAR Take 1 tablet (50 mg total) by mouth daily.   Melatonin 5 MG Tabs Take by mouth. Take one tablet every night at bedtime   OXYGEN Inhale 2 L/min into the lungs continuous.   pantoprazole 40 MG tablet Commonly known as:  PROTONIX Take 1 tablet (40 mg total) by mouth daily.   predniSONE 5 MG tablet Commonly known as:  DELTASONE Take 5 mg by mouth. Take one tablet once a day       Review of Systems  Constitutional: Negative for activity change, appetite change, chills, diaphoresis, fatigue, fever and unexpected weight change.  HENT: Negative for congestion, ear discharge, ear pain, hearing loss, postnasal drip, rhinorrhea, sinus pressure, sneezing, sore throat, tinnitus, trouble swallowing and voice change.   Eyes: Negative for pain, discharge, redness, itching and visual disturbance.  Respiratory: Positive for cough and shortness of breath. Negative for choking, chest tightness and wheezing.        COPD with O2 dependency  Cardiovascular: Negative for chest pain, palpitations and leg swelling.  Gastrointestinal: Negative.  Negative for abdominal distention, abdominal pain, constipation, diarrhea and nausea.  Endocrine: Negative for cold intolerance, heat intolerance, polydipsia, polyphagia and polyuria.  Genitourinary: Negative.  Negative for difficulty urinating, dysuria, flank pain, frequency,  hematuria, pelvic pain, urgency and vaginal discharge.  Musculoskeletal: Positive for gait problem. Negative for arthralgias, back pain, myalgias, neck pain and neck stiffness.       Wears Hard collar   Skin: Negative for color change, pallor and rash.  Allergic/Immunologic: Negative.   Neurological: Positive for weakness. Negative for dizziness, tremors, seizures, syncope, numbness and headaches.       Cognitive changes. Speech impediment of unknown etiology.  Hematological: Negative for adenopathy. Does not bruise/bleed easily.       Anemic in Jan 2018 with Hgb 5.9.thought due to UGI bleed. Endoscopy was not done. She was transfused 2 units PRBC. Hgb now normal.   Psychiatric/Behavioral: Negative.  Negative for agitation, behavioral problems, confusion, dysphoric mood, hallucinations, sleep disturbance and suicidal ideas. The patient is not nervous/anxious and is not hyperactive.     Immunization History  Administered Date(s) Administered  .  Influenza Split 11/03/2011, 11/17/2013, 11/25/2014  . Influenza-Unspecified 12/12/2015, 12/19/2015  . PPD Test 06/06/2015, 06/20/2015  . Pneumococcal Polysaccharide-23 09/17/2013   Pertinent  Health Maintenance Due  Topic Date Due  . PNA vac Low Risk Adult (2 of 2 - PCV13) 09/18/2014  . INFLUENZA VACCINE  Completed  . DEXA SCAN  Completed   Fall Risk  04/11/2015  Falls in the past year? No   Functional Status Survey:    Vitals:   05/22/16 1257  BP: 100/64  Pulse: 96  Resp: 20  Temp: 98 F (36.7 C)  SpO2: 95%  Weight: 136 lb 14.4 oz (62.1 kg)  Height: 5\' 3"  (1.6 m)   Body mass index is 24.25 kg/m. Physical Exam  Constitutional: She is oriented to person, place, and time. No distress.  Frail elderly   HENT:  Head: Normocephalic.  Right Ear: External ear normal.  Left Ear: External ear normal.  Nose: Nose normal.  Mouth/Throat: Oropharynx is clear and moist. No oropharyngeal exudate.  Eyes: Conjunctivae and EOM are normal.  Pupils are equal, round, and reactive to light. Right eye exhibits no discharge. Left eye exhibits no discharge. No scleral icterus.  Neck: No JVD present. No tracheal deviation present. No thyromegaly present.  Hard collar in place.  Cardiovascular: Normal rate, regular rhythm and intact distal pulses.  Exam reveals no gallop and no friction rub.   Murmur heard. Systolic murmur 3-4/6  Pulmonary/Chest: Effort normal and breath sounds normal. No respiratory distress. She has no wheezes. She has no rales. She exhibits no tenderness.  Diffused decreased breath sounds, O2 dependent.   Abdominal: Soft. Bowel sounds are normal. She exhibits no distension and no mass. There is no tenderness. There is no rebound and no guarding.  Musculoskeletal: Normal range of motion. She exhibits no edema or tenderness.  Muscle weakness to lower extremities.   Lymphadenopathy:    She has no cervical adenopathy.  Neurological: She is alert and oriented to person, place, and time. No cranial nerve deficit. Coordination normal.  Speech impaired: hesitant, gutteral, difficult too understand.   Skin: Skin is warm and dry. No rash noted. She is not diaphoretic. No erythema. No pallor.  Psychiatric: She has a normal mood and affect. Her behavior is normal.  Expresses several times that she is angry about her rent move and that she feels like it is motivated by money to the institution.    Labs reviewed:  Recent Labs  03/07/16 1611 03/07/16 2247 03/08/16 0512  NA 142 139 139  K 4.8 4.1 3.8  CL 108 105 106  CO2 28 24 26   GLUCOSE 113* 109* 101*  BUN 22 20 16   CREATININE 0.63 0.70 0.67  CALCIUM 8.8 9.0 8.6*    Recent Labs  01/01/16 1446 03/07/16 2247 03/08/16 0512  AST 16 38 39  ALT 11 61* 57*  ALKPHOS 88 127* 119  BILITOT 0.2 0.7 0.5  PROT 7.0 6.5 5.9*  ALBUMIN 4.0 3.1* 2.7*    Recent Labs  03/07/16 1611  03/09/16 0602 03/28/16 1715 04/11/16 1506 05/13/16  WBC 8.8  < > 7.5 6.5 5.9 7.3    NEUTROABS 7.2  --   --  4.1 4.0  --   HGB 5.9 Repeated and verified X2.*  < > 8.1* 12.0 12.4 10.6*  HCT 18.6 Repeated and verified X2.*  < > 25.6* 38.0 38.2 34*  MCV 82.6  < > 85.0 85.1 85.1  --   PLT 343.0  < > 249 191.0 153.0  163  < > = values in this interval not displayed. Lab Results  Component Value Date   TSH 2.01 01/01/2016   No results found for: HGBA1C Lab Results  Component Value Date   CHOL 260 (H) 04/11/2015   HDL 92.50 04/11/2015   LDLCALC 148 (H) 04/11/2015   LDLDIRECT 189.2 12/12/2008   TRIG 98.0 04/11/2015   CHOLHDL 3 04/11/2015    Significant Diagnostic Results in last 30 days:  No results found.  Assessment/Plan: Essential hypertension Controlled, continue Losartan 50mg  qd, Furosemide 20mg  daily,  COPD GOLD II  Maintained on Tessalon tid prn, Robitussin q4h prn, DuoNeb qid, Prednisone 5mg  qd,  Constipation 05/19/16 dc MiraLax and Colace-pt's refusal.   GERD (gastroesophageal reflux disease) stable, taking Protonix 40mg  qd   DEGENERATIVE DISC DISEASE, CERVICAL SPINE, WITH MYELOPATHY on Tylenol 500mg  2tabs tid prn,   Anemia 05/13/16 Hgb 10.6, continue Fe  NICOTINE ADDICTION 05/22/16 dc'd Nicotine patch  Depression managed with Alprazolam 0.5mg  tid and q6h prn,   Insomnia managed with Alprazolam 0.5mg  tid and q6h prn,   Anxiety state managed with Alprazolam 0.5mg  tid and q6h prn,     Family/ staff Communication: SNF palliative care  Labs/tests ordered: none

## 2016-05-22 NOTE — Assessment & Plan Note (Signed)
managed with Alprazolam 0.5mg  tid and q6h prn,

## 2016-05-22 NOTE — Assessment & Plan Note (Signed)
stable, taking Protonix 40mg qd  

## 2016-05-29 DIAGNOSIS — R350 Frequency of micturition: Secondary | ICD-10-CM | POA: Diagnosis not present

## 2016-05-29 DIAGNOSIS — A0472 Enterocolitis due to Clostridium difficile, not specified as recurrent: Secondary | ICD-10-CM | POA: Diagnosis not present

## 2016-05-29 DIAGNOSIS — N760 Acute vaginitis: Secondary | ICD-10-CM | POA: Diagnosis not present

## 2016-05-29 DIAGNOSIS — I10 Essential (primary) hypertension: Secondary | ICD-10-CM | POA: Diagnosis not present

## 2016-05-29 LAB — BASIC METABOLIC PANEL
BUN: 13 mg/dL (ref 4–21)
CREATININE: 0.6 mg/dL (ref <=1.1)
Glucose: 165 mg/dL
POTASSIUM: 3.3 mmol/L — AB (ref 3.4–5.3)
SODIUM: 138 mmol/L (ref 137–147)

## 2016-05-29 LAB — CBC AND DIFFERENTIAL
HEMATOCRIT: 36 % (ref 36–46)
Hemoglobin: 11.6 g/dL — AB (ref 12.0–16.0)
Platelets: 183 10*3/uL (ref 150–399)
WBC: 5.9 10^3/mL

## 2016-05-29 LAB — HEPATIC FUNCTION PANEL
ALK PHOS: 84 U/L (ref 25–125)
ALT: 21 U/L (ref 7–35)
AST: 21 U/L (ref 13–35)
Bilirubin, Total: 0.2 mg/dL

## 2016-05-31 DIAGNOSIS — A0472 Enterocolitis due to Clostridium difficile, not specified as recurrent: Secondary | ICD-10-CM | POA: Diagnosis not present

## 2016-06-01 DIAGNOSIS — A0472 Enterocolitis due to Clostridium difficile, not specified as recurrent: Secondary | ICD-10-CM | POA: Diagnosis not present

## 2016-06-02 ENCOUNTER — Other Ambulatory Visit: Payer: Self-pay | Admitting: *Deleted

## 2016-06-05 ENCOUNTER — Non-Acute Institutional Stay (SKILLED_NURSING_FACILITY): Payer: Medicare Other | Admitting: Nurse Practitioner

## 2016-06-05 ENCOUNTER — Encounter: Payer: Self-pay | Admitting: Nurse Practitioner

## 2016-06-05 DIAGNOSIS — D500 Iron deficiency anemia secondary to blood loss (chronic): Secondary | ICD-10-CM | POA: Diagnosis not present

## 2016-06-05 DIAGNOSIS — J449 Chronic obstructive pulmonary disease, unspecified: Secondary | ICD-10-CM | POA: Diagnosis not present

## 2016-06-05 DIAGNOSIS — N390 Urinary tract infection, site not specified: Secondary | ICD-10-CM

## 2016-06-05 DIAGNOSIS — G4700 Insomnia, unspecified: Secondary | ICD-10-CM

## 2016-06-05 DIAGNOSIS — N3946 Mixed incontinence: Secondary | ICD-10-CM | POA: Diagnosis not present

## 2016-06-05 DIAGNOSIS — K219 Gastro-esophageal reflux disease without esophagitis: Secondary | ICD-10-CM | POA: Diagnosis not present

## 2016-06-05 DIAGNOSIS — F329 Major depressive disorder, single episode, unspecified: Secondary | ICD-10-CM

## 2016-06-05 DIAGNOSIS — F32A Depression, unspecified: Secondary | ICD-10-CM

## 2016-06-05 DIAGNOSIS — K5900 Constipation, unspecified: Secondary | ICD-10-CM | POA: Diagnosis not present

## 2016-06-05 DIAGNOSIS — M5000 Cervical disc disorder with myelopathy, unspecified cervical region: Secondary | ICD-10-CM | POA: Diagnosis not present

## 2016-06-05 DIAGNOSIS — F411 Generalized anxiety disorder: Secondary | ICD-10-CM

## 2016-06-05 DIAGNOSIS — I10 Essential (primary) hypertension: Secondary | ICD-10-CM | POA: Diagnosis not present

## 2016-06-05 DIAGNOSIS — Z0100 Encounter for examination of eyes and vision without abnormal findings: Secondary | ICD-10-CM | POA: Diagnosis not present

## 2016-06-05 NOTE — Assessment & Plan Note (Signed)
05/13/16 Hgb 10.6, continue Fe

## 2016-06-05 NOTE — Assessment & Plan Note (Signed)
on Tylenol  2tabs tid prn

## 2016-06-05 NOTE — Assessment & Plan Note (Signed)
managed with Alprazolam 0.5mg  tid and q6h prn, effective, re-evaluate in 3 months.

## 2016-06-05 NOTE — Assessment & Plan Note (Signed)
managed with Alprazolam 0.5mg tid and q6h prn 

## 2016-06-05 NOTE — Assessment & Plan Note (Signed)
Chronic. 

## 2016-06-05 NOTE — Assessment & Plan Note (Signed)
Controlled, continue Losartan  qd, Furosemide  daily

## 2016-06-05 NOTE — Assessment & Plan Note (Signed)
stable, taking Protonix 40mg qd  

## 2016-06-05 NOTE — Progress Notes (Signed)
Location:  [facility] Room Number: 31 Place of Service:  SNF (31) Provider: Senay Sistrunk, Manxie  NP  Murray Hodgkins, MD  Patient Care Team: Kimber Relic, MD as PCP - General (Internal Medicine) Barbaraann Share, MD as Consulting Physician (Pulmonary Disease) Gean Birchwood, MD as Consulting Physician (Orthopedic Surgery) Barnett Abu, MD as Consulting Physician (Neurosurgery)  Extended Emergency Contact Information Primary Emergency Contact: Sandi Mariscal Address: [address]          [place], [place] Phone: [phone] Relation: Daughter Secondary Emergency Contact: Theresia Lo [place] Home Phone: [phone] Relation: Other  Code Status:  DNR Goals of care: Advanced Directive information Advanced Directives 06/05/2016  Does Patient Have a Medical Advance Directive? Yes  Type of Estate agent of [place];Living will;Out of facility DNR (pink MOST or yellow form)  Does patient want to make changes to medical advance directive? No - Patient declined  Copy of Healthcare Power of Attorney in Chart? Yes  Would patient like information on creating a medical advance directive? -  Pre-existing out of facility DNR order (yellow form or pink MOST form) Yellow form placed in chart (order not valid for inpatient use)     Chief Complaint  Patient presents with  . Acute Visit    medication evaluation    HPI:  Pt is a 83 y.o. female seen today for an acute visit for UTI, urine culture 06/02/16 E Coli >100,000c/ml, 7 day course of Septra DS started and tolerated.     Hx of HTN, taking Losartan , Furosemide , COP, maintained on Tessalon, Robitussin, DuoNed, Prednisone  qd, GERD stable, taking Protonix  qd, back pain, taking Tylenol, Anemia 05/13/16 Hgb 10.6, taking Fe, depression/anxiety/insomnia, taking Alprazolam 0.5mg  tid and q6h prn Past Medical History:  Diagnosis Date  .  Alcohol abuse   . Anemia   . Anxiety   . Aortic stenosis    moderate on echo 09/2013  . Cervical spondylosis with myelopathy 01/2009   s/p decompression  . COPD (chronic obstructive pulmonary disease) (HCC)   . Dementia 09/06/2015  . Depression   . Fall at home 05/2015  . Heart murmur   . History of blood transfusion 01/02/2016   "anemia"  . Hypertension   . On home oxygen therapy    "2L; 24/7" (01/02/2016)  . Osteoporosis    (R) hip fx 11/2009 & (L) hip fx 2006  . Speech impediment    present for >9 months; no one has really been able to discern an etiology/daughter/notes 01/01/2016   Past Surgical History:  Procedure Laterality Date  . BACK SURGERY    . BLADDER SUSPENSION     "unsuccessful"  . CATARACT EXTRACTION W/ INTRAOCULAR LENS  IMPLANT, BILATERAL Bilateral 06/06/13 - 2016   "right - left"  . FRACTURE SURGERY    . HIP FRACTURE SURGERY Left 1980s  . LAPAROSCOPIC CHOLECYSTECTOMY  1995  . ORIF HIP FRACTURE Right 11/2009   Done by Dr. Turner Daniels  . POSTERIOR LAMINECTOMY / DECOMPRESSION CERVICAL SPINE  01/2009   Done by Dr. Danielle Dess  . WRIST FRACTURE SURGERY Bilateral    "one in [place]; one [place]"    Allergies  Allergen Reactions  . Ativan [Lorazepam] Other (See Comments)    Causes increased confusion/AMS    Outpatient Encounter Prescriptions as of 06/05/2016  Medication Sig  . acetaminophen (TYLENOL) 500 MG tablet Take 500 mg by mouth. Take 2 tablets as needed up to  3 times daily  . ALPRAZolam (XANAX) 0.5 MG tablet Take 0.5 mg by mouth. Take one tablet 3 times a day. Take one every 6 hours as needed for anxiety  . benzonatate (TESSALON) 100 MG capsule Take 1 capsule by mouth 3 (three) times daily as needed for cough.   . ferrous sulfate (FERROUSUL) 325 (65 FE) MG tablet Take 1 tablet (325 mg total) by mouth 2 (two) times daily with a meal.  . furosemide (LASIX) 20 MG tablet Take 20 mg by mouth daily.  Marland Kitchen guaifenesin (ROBITUSSIN) 100 MG/5ML syrup Take 200 mg by mouth. Take 10  ml every 4 hours as needed for cough  . ipratropium-albuterol (DUONEB) 0.5-2.5 (3) MG/3ML SOLN Take 3 mLs by nebulization. Use nebulizer four times daily  . loperamide (IMODIUM) 2 MG capsule 1 tab po bid prn diarrhea  . losartan (COZAAR) 50 MG tablet Take 1 tablet (50 mg total) by mouth daily.  . Melatonin 5 MG TABS Take by mouth. Take one tablet every night at bedtime  . OXYGEN Inhale 2 L/min into the lungs continuous.  . pantoprazole (PROTONIX) 40 MG tablet Take 1 tablet (40 mg total) by mouth daily.  . predniSONE (DELTASONE) 5 MG tablet Take 5 mg by mouth. Take one tablet once a day  . saccharomyces boulardii (FLORASTOR) 250 MG capsule Take 250 mg by mouth 2 (two) times daily.  Marland Kitchen sulfamethoxazole-trimethoprim (BACTRIM DS,SEPTRA DS) 800-160 MG tablet Take 1 tablet by mouth 2 (two) times daily.   No facility-administered encounter medications on file as of 06/05/2016.     Review of Systems  Constitutional: Negative for activity change, appetite change, chills, diaphoresis, fatigue, fever and unexpected weight change.  HENT: Negative for congestion, ear discharge, ear pain, hearing loss, postnasal drip, rhinorrhea, sinus pressure, sneezing, sore throat, tinnitus, trouble swallowing and voice change.   Eyes: Negative for pain, discharge, redness, itching and visual disturbance.  Respiratory: Positive for cough and shortness of breath. Negative for choking, chest tightness and wheezing.        COPD with O2 dependency  Cardiovascular: Negative for chest pain, palpitations and leg swelling.  Gastrointestinal: Negative.  Negative for abdominal distention, abdominal pain, constipation, diarrhea and nausea.  Endocrine: Negative for cold intolerance, heat intolerance, polydipsia, polyphagia and polyuria.  Genitourinary: Negative.  Negative for difficulty urinating, dysuria, flank pain, frequency, hematuria, pelvic pain, urgency and vaginal discharge.  Musculoskeletal: Positive for gait problem.  Negative for arthralgias, back pain, myalgias, neck pain and neck stiffness.       Wears Hard collar   Skin: Negative for color change, pallor and rash.  Allergic/Immunologic: Negative.   Neurological: Positive for weakness. Negative for dizziness, tremors, seizures, syncope, numbness and headaches.       Cognitive changes. Speech impediment of unknown etiology.  Hematological: Negative for adenopathy. Does not bruise/bleed easily.       Anemic in Jan 2018 with Hgb 5.9.thought due to UGI bleed. Endoscopy was not done. She was transfused 2 units PRBC. Hgb now normal.   Psychiatric/Behavioral: Negative.  Negative for agitation, behavioral problems, confusion, dysphoric mood, hallucinations, sleep disturbance and suicidal ideas. The patient is not nervous/anxious and is not hyperactive.     Immunization History  Administered Date(s) Administered  . Influenza Split 11/03/2011, 11/17/2013, 11/25/2014  . Influenza-Unspecified 12/12/2015, 12/19/2015  . PPD Test 06/06/2015, 06/20/2015  . Pneumococcal Polysaccharide-23 09/17/2013   Pertinent  Health Maintenance Due  Topic Date Due  . PNA vac Low Risk Adult (2 of 2 - PCV13) 09/18/2014  .  INFLUENZA VACCINE  09/24/2016  . DEXA SCAN  Completed   Fall Risk  04/11/2015  Falls in the past year? No   Functional Status Survey:    Vitals:   06/05/16 1443  BP: 130/70  Pulse: 80  Resp: 20  Temp: 98 F (36.7 C)  SpO2: 95%  Weight: 139 lb 9.6 oz (63.3 kg)  Height:  (1.6 m)   Body mass index is 24.73 kg/m. Physical Exam  Constitutional: She is oriented to person, place, and time. No distress.  Frail elderly   HENT:  Head: Normocephalic.  Right Ear: External ear normal.  Left Ear: External ear normal.  Nose: Nose normal.  Mouth/Throat: Oropharynx is clear and moist. No oropharyngeal exudate.  Eyes: Conjunctivae and EOM are normal. Pupils are equal, round, and reactive to light. Right eye exhibits no discharge. Left eye exhibits no  discharge. No scleral icterus.  Neck: No JVD present. No tracheal deviation present. No thyromegaly present.  Hard collar in place.  Cardiovascular: Normal rate, regular rhythm and intact distal pulses.  Exam reveals no gallop and no friction rub.   Murmur heard. Systolic murmur 3-4/6  Pulmonary/Chest: Effort normal and breath sounds normal. No respiratory distress. She has no wheezes. She has no rales. She exhibits no tenderness.  Diffused decreased breath sounds, O2 dependent.   Abdominal: Soft. Bowel sounds are normal. She exhibits no distension and no mass. There is no tenderness. There is no rebound and no guarding.  Musculoskeletal: Normal range of motion. She exhibits no edema or tenderness.  Muscle weakness to lower extremities.   Lymphadenopathy:    She has no cervical adenopathy.  Neurological: She is alert and oriented to person, place, and time. No cranial nerve deficit. Coordination normal.  Speech impaired: hesitant, gutteral, difficult too understand.   Skin: Skin is warm and dry. No rash noted. She is not diaphoretic. No erythema. No pallor.  Psychiatric: She has a normal mood and affect. Her behavior is normal.  Expresses several times that she is angry about her rent move and that she feels like it is motivated by money to the institution.    Labs reviewed:  Recent Labs  03/07/16 1611 03/07/16 2247 03/08/16 0512 05/29/16  NA 142 139 139 138  K 4.8 4.1 3.8 3.3*  CL 108 105 106  --   CO2 --   GLUCOSE 113* 109* 101*  --   BUN CREATININE 0.63 0.70 0.67 0.6  CALCIUM 8.8 9.0 8.6*  --     Recent Labs  01/01/16 1446 03/07/16 2247 03/08/16 0512 05/29/16  AST 16 38 39 21  ALT 11 61* 57* 21  ALKPHOS 88 127* 119 84  BILITOT 0.2 0.7 0.5  --   PROT 7.0 6.5 5.9*  --   ALBUMIN 4.0 3.1* 2.7*  --     Recent Labs  03/07/16 1611  03/09/16 0602 03/28/16 1715 04/11/16 1506 05/13/16 05/29/16  WBC 8.8  < > 7.5 6.5 5.9 7.3 5.9  NEUTROABS 7.2   --   --  4.1 4.0  --   --   HGB 5.9 Repeated and verified X2.*  < > 8.1* 12.0 12.4 10.6* 11.6*  HCT 18.6 Repeated and verified X2.*  < > 25.6* 38.0 38.2 34* 36  MCV 82.6  < > 85.0 85.1 85.1  --   --   PLT 343.0  < > 249 191.0 153.0 163 183  < > = values in  this interval not displayed. Lab Results  Component Value Date   TSH 2.01 01/01/2016   No results found for: HGBA1C Lab Results  Component Value Date   CHOL 260 (H) 04/11/2015   HDL 92.50 04/11/2015   LDLCALC 148 (H) 04/11/2015   LDLDIRECT 189.2 12/12/2008   TRIG 98.0 04/11/2015   CHOLHDL 3 04/11/2015    Significant Diagnostic Results in last 30 days:  No results found.  Assessment/Plan UTI (urinary tract infection) urine culture 06/02/16 E Coli >100,000c/ml, 7 day course of Septra DS started and tolerated.    Anxiety state managed with Alprazolam 0.5mg  tid and q6h prn, effective, re-evaluate in 3 months.   Essential hypertension Controlled, continue Losartan  qd, Furosemide  daily  COPD GOLD II  Maintained on Tessalon tid prn, Robitussin q4h prn, DuoNeb qid, Prednisone  qd,  Constipation 05/19/16 dc MiraLax and Colace-pt's refusal.  05/30/16 negative toxigenic C difficile, no enteric campylocacter, Salmonella, or shigella.    GERD (gastroesophageal reflux disease) stable, taking Protonix  qd  DEGENERATIVE DISC DISEASE, CERVICAL SPINE, WITH MYELOPATHY on Tylenol  2tabs tid prn  Anemia 05/13/16 Hgb 10.6, continue Fe  Depression managed with Alprazolam 0.5mg  tid and q6h prn  MIXED INCONTINENCE URGE AND STRESS Chronic   Insomnia managed with Alprazolam 0.5mg  tid and q6h prn     Family/ staff Communication: SNF  Labs/tests ordered:  none

## 2016-06-05 NOTE — Assessment & Plan Note (Signed)
05/19/16 dc MiraLax and Colace-pt's refusal.  05/30/16 negative toxigenic C difficile, no enteric campylocacter, Salmonella, or shigella.

## 2016-06-05 NOTE — Assessment & Plan Note (Signed)
managed with Alprazolam 0.5mg  tid and q6h prn

## 2016-06-05 NOTE — Assessment & Plan Note (Signed)
urine culture 06/02/16 E Coli >100,000c/ml, 7 day course of Septra DS started and tolerated.

## 2016-06-05 NOTE — Assessment & Plan Note (Signed)
Maintained on Tessalon tid prn, Robitussin q4h prn, DuoNeb qid, Prednisone  qd,

## 2016-06-12 DIAGNOSIS — J441 Chronic obstructive pulmonary disease with (acute) exacerbation: Secondary | ICD-10-CM | POA: Diagnosis not present

## 2016-06-12 DIAGNOSIS — R0600 Dyspnea, unspecified: Secondary | ICD-10-CM | POA: Diagnosis not present

## 2016-06-12 LAB — CBC AND DIFFERENTIAL
HCT: 32 % — AB (ref 36–46)
Hemoglobin: 10.5 g/dL — AB (ref 12.0–16.0)
Platelets: 171 10*3/uL (ref 150–399)
WBC: 6 10^3/mL

## 2016-06-16 ENCOUNTER — Other Ambulatory Visit: Payer: Self-pay | Admitting: *Deleted

## 2016-06-20 DIAGNOSIS — M79671 Pain in right foot: Secondary | ICD-10-CM | POA: Diagnosis not present

## 2016-06-20 DIAGNOSIS — M79661 Pain in right lower leg: Secondary | ICD-10-CM | POA: Diagnosis not present

## 2016-06-20 DIAGNOSIS — M25571 Pain in right ankle and joints of right foot: Secondary | ICD-10-CM | POA: Diagnosis not present

## 2016-06-23 ENCOUNTER — Encounter: Payer: Self-pay | Admitting: Nurse Practitioner

## 2016-06-23 ENCOUNTER — Non-Acute Institutional Stay (SKILLED_NURSING_FACILITY): Payer: Medicare Other | Admitting: Nurse Practitioner

## 2016-06-23 DIAGNOSIS — S82831A Other fracture of upper and lower end of right fibula, initial encounter for closed fracture: Secondary | ICD-10-CM

## 2016-06-23 DIAGNOSIS — R269 Unspecified abnormalities of gait and mobility: Secondary | ICD-10-CM | POA: Diagnosis not present

## 2016-06-23 DIAGNOSIS — R627 Adult failure to thrive: Secondary | ICD-10-CM | POA: Insufficient documentation

## 2016-06-23 DIAGNOSIS — S82401A Unspecified fracture of shaft of right fibula, initial encounter for closed fracture: Secondary | ICD-10-CM | POA: Insufficient documentation

## 2016-06-23 DIAGNOSIS — D500 Iron deficiency anemia secondary to blood loss (chronic): Secondary | ICD-10-CM

## 2016-06-23 DIAGNOSIS — F411 Generalized anxiety disorder: Secondary | ICD-10-CM | POA: Diagnosis not present

## 2016-06-23 DIAGNOSIS — I10 Essential (primary) hypertension: Secondary | ICD-10-CM

## 2016-06-23 DIAGNOSIS — K5900 Constipation, unspecified: Secondary | ICD-10-CM | POA: Diagnosis not present

## 2016-06-23 DIAGNOSIS — K219 Gastro-esophageal reflux disease without esophagitis: Secondary | ICD-10-CM | POA: Diagnosis not present

## 2016-06-23 DIAGNOSIS — J449 Chronic obstructive pulmonary disease, unspecified: Secondary | ICD-10-CM | POA: Diagnosis not present

## 2016-06-23 DIAGNOSIS — N390 Urinary tract infection, site not specified: Secondary | ICD-10-CM | POA: Diagnosis not present

## 2016-06-23 NOTE — Assessment & Plan Note (Signed)
Dc Protonix-patient's refusal.

## 2016-06-23 NOTE — Assessment & Plan Note (Signed)
05/19/16 dc MiraLax and Colace-pt's refusal.  05/30/16 negative toxigenic C difficile, no enteric campylocacter, Salmonella, or shigella.

## 2016-06-23 NOTE — Assessment & Plan Note (Signed)
managed with Alprazolam 0.5mg  tid and q6h prn, effective

## 2016-06-23 NOTE — Assessment & Plan Note (Signed)
w/c for mobility, self transferring.

## 2016-06-23 NOTE — Assessment & Plan Note (Signed)
06/20/16 X-ray nondisplaced fracture of the proximal fibular shaft. The patient declined Ortho consultation. Denied pain or discomfort. Will apply the right knee immobilizer. Observe.

## 2016-06-23 NOTE — Assessment & Plan Note (Signed)
06/12/16 Hgb 10.5

## 2016-06-23 NOTE — Progress Notes (Signed)
Location:  [facility] Room Number: 31 Place of Service:  SNF (31) Provider:  Deena Shaub, Manxie  NP  Murray Hodgkins, MD  Patient Care Team: Kimber Relic, MD as PCP - General (Internal Medicine) Barbaraann Share, MD as Consulting Physician (Pulmonary Disease) Gean Birchwood, MD as Consulting Physician (Orthopedic Surgery) Barnett Abu, MD as Consulting Physician (Neurosurgery)  Extended Emergency Contact Information Primary Emergency Contact: Sandi Mariscal Address: [address]          [place], [place] Phone: [phone] Relation: Daughter Secondary Emergency Contact: Theresia Lo [place] Home Phone: [phone] Relation: Other  Code Status:  Full Code Goals of care: Advanced Directive information Advanced Directives 06/23/2016  Does Patient Have a Medical Advance Directive? Yes  Type of Estate agent of [place];Living will;Out of facility DNR (pink MOST or yellow form)  Does patient want to make changes to medical advance directive? No - Patient declined  Copy of Healthcare Power of Attorney in Chart? Yes  Would patient like information on creating a medical advance directive? -  Pre-existing out of facility DNR order (yellow form or pink MOST form) Yellow form placed in chart (order not valid for inpatient use)     Chief Complaint  Patient presents with  . Acute Visit    Fx tibia    HPI:  Pt is a 83 y.o. female seen today for an acute visit for the nondisplaced fracture of the proximal fibular shaft. Denied the pain or discomfort, still able to bear weight and transfer self from w/c to bed. Denied Orth consultation, refuse to take meds except Alprazolam.   She was treated for UTI 06/02/16 with 7 day course of Septra DS. She c/o dark urine x 2 weeks, denied abd pain, CVA tenderness, dysuria, fever, or nausea/vomiting.     Hx of HTN, taking Losartan , Furosemide , COP,  maintained on Tessalon, Robitussin, DuoNed, Prednisone  qd, GERD stable, taking Protonix  qd, back pain, taking Tylenol, Anemia 05/13/16 Hgb 10.6, taking Fe, depression/anxiety/insomnia, taking Alprazolam 0.5mg  tid and q6h prn  Past Medical History:  Diagnosis Date  . Alcohol abuse   . Anemia   . Anxiety   . Aortic stenosis    moderate on echo 09/2013  . Cervical spondylosis with myelopathy 01/2009   s/p decompression  . COPD (chronic obstructive pulmonary disease) (HCC)   . Dementia 09/06/2015  . Depression   . Fall at home 05/2015  . Heart murmur   . History of blood transfusion 01/02/2016   "anemia"  . Hypertension   . On home oxygen therapy    "2L; 24/7" (01/02/2016)  . Osteoporosis    (R) hip fx 11/2009 & (L) hip fx 2006  . Speech impediment    present for >9 months; no one has really been able to discern an etiology/daughter/notes 01/01/2016   Past Surgical History:  Procedure Laterality Date  . BACK SURGERY    . BLADDER SUSPENSION     "unsuccessful"  . CATARACT EXTRACTION W/ INTRAOCULAR LENS  IMPLANT, BILATERAL Bilateral 06/06/13 - 2016   "right - left"  . FRACTURE SURGERY    . HIP FRACTURE SURGERY Left 1980s  . LAPAROSCOPIC CHOLECYSTECTOMY  1995  . ORIF HIP FRACTURE Right 11/2009   Done by Dr. Turner Daniels  . POSTERIOR LAMINECTOMY / DECOMPRESSION CERVICAL SPINE  01/2009   Done by Dr. Danielle Dess  . WRIST FRACTURE SURGERY Bilateral    "one in [place]; one [place]"    Allergies  Allergen Reactions  . Ativan [Lorazepam] Other (See Comments)    Causes increased confusion/AMS    Outpatient Encounter Prescriptions as of 06/23/2016  Medication Sig  . acetaminophen (TYLENOL) 500 MG tablet Take 500 mg by mouth. Take 2 tablets as needed up to 3 times daily  . ALPRAZolam (XANAX) 0.5 MG tablet Take 0.5 mg by mouth. Take one tablet 3 times a day. Take one every 6 hours as needed for anxiety  . benzonatate (TESSALON) 100 MG capsule Take 1 capsule by mouth 3 (three) times daily as  needed for cough.   . ferrous sulfate (FERROUSUL) 325 (65 FE) MG tablet Take 1 tablet (325 mg total) by mouth 2 (two) times daily with a meal.  . furosemide (LASIX) 20 MG tablet Take 20 mg by mouth daily.  Marland Kitchen guaifenesin (ROBITUSSIN) 100 MG/5ML syrup Take 200 mg by mouth. Take 10 ml every 4 hours as needed for cough  . ipratropium-albuterol (DUONEB) 0.5-2.5 (3) MG/3ML SOLN Take 3 mLs by nebulization. Use nebulizer four times daily  . loperamide (IMODIUM) 2 MG capsule 1 tab po bid prn diarrhea  . losartan (COZAAR) 50 MG tablet Take 1 tablet (50 mg total) by mouth daily.  . Melatonin 5 MG TABS Take by mouth. Take one tablet every night at bedtime  . OXYGEN Inhale 2 L/min into the lungs continuous.  . pantoprazole (PROTONIX) 40 MG tablet Take 1 tablet (40 mg total) by mouth daily.  . predniSONE (DELTASONE) 5 MG tablet Take 5 mg by mouth. Take one tablet once a day  . saccharomyces boulardii (FLORASTOR) 250 MG capsule Take 250 mg by mouth 2 (two) times daily.   No facility-administered encounter medications on file as of 06/23/2016.     Review of Systems  Constitutional: Negative for activity change, appetite change, chills, diaphoresis, fatigue, fever and unexpected weight change.  HENT: Negative for congestion, ear discharge, ear pain, hearing loss, postnasal drip, rhinorrhea, sinus pressure, sneezing, sore throat, tinnitus, trouble swallowing and voice change.   Eyes: Negative for pain, discharge, redness, itching and visual disturbance.  Respiratory: Positive for cough and shortness of breath. Negative for choking, chest tightness and wheezing.        COPD with O2 dependency  Cardiovascular: Positive for leg swelling. Negative for chest pain and palpitations.  Gastrointestinal: Negative.  Negative for abdominal distention, abdominal pain, constipation, diarrhea and nausea.  Endocrine: Negative for cold intolerance, heat intolerance, polydipsia, polyphagia and polyuria.  Genitourinary:  Negative.  Negative for difficulty urinating, dysuria, flank pain, frequency, hematuria, pelvic pain, urgency and vaginal discharge.  Musculoskeletal: Positive for gait problem. Negative for arthralgias, back pain, myalgias, neck pain and neck stiffness.       Wears Hard collar   Skin: Negative for color change, pallor and rash.  Allergic/Immunologic: Negative.   Neurological: Positive for weakness. Negative for dizziness, tremors, seizures, syncope, numbness and headaches.       Cognitive changes. Speech impediment of unknown etiology.  Hematological: Negative for adenopathy. Does not bruise/bleed easily.       Anemic in Jan 2018 with Hgb 5.9.thought due to UGI bleed. Endoscopy was not done. She was transfused 2 units PRBC. Hgb now normal.   Psychiatric/Behavioral: Negative.  Negative for agitation, behavioral problems, confusion, dysphoric mood, hallucinations, sleep disturbance and suicidal ideas. The patient is not nervous/anxious and is not hyperactive.     Immunization History  Administered Date(s) Administered  . Influenza Split 11/03/2011, 11/17/2013, 11/25/2014  . Influenza-Unspecified 12/12/2015,  12/19/2015  . PPD Test 06/06/2015, 06/20/2015  . Pneumococcal Polysaccharide-23 09/17/2013   Pertinent  Health Maintenance Due  Topic Date Due  . PNA vac Low Risk Adult (2 of 2 - PCV13) 09/18/2014  . INFLUENZA VACCINE  09/24/2016  . DEXA SCAN  Completed   Fall Risk  04/11/2015  Falls in the past year? No   Functional Status Survey:    Vitals:   06/23/16 1215  BP: 114/66  Pulse: 96  Resp: 20  Temp: 98.2 F (36.8 C)  SpO2: 95%  Weight: 139 lb (63 kg)  Height:  (1.6 m)   Body mass index is 24.62 kg/m. Physical Exam  Constitutional: She is oriented to person, place, and time. No distress.  Frail elderly   HENT:  Head: Normocephalic.  Right Ear: External ear normal.  Left Ear: External ear normal.  Nose: Nose normal.  Mouth/Throat: Oropharynx is clear and moist.  No oropharyngeal exudate.  Eyes: Conjunctivae and EOM are normal. Pupils are equal, round, and reactive to light. Right eye exhibits no discharge. Left eye exhibits no discharge. No scleral icterus.  Neck: No JVD present. No tracheal deviation present. No thyromegaly present.  Hard collar in place.  Cardiovascular: Normal rate, regular rhythm and intact distal pulses.  Exam reveals no gallop and no friction rub.   Murmur heard. Systolic murmur 3-4/6  Pulmonary/Chest: Effort normal and breath sounds normal. No respiratory distress. She has no wheezes. She has no rales. She exhibits no tenderness.  Diffused decreased breath sounds, O2 dependent.   Abdominal: Soft. Bowel sounds are normal. She exhibits no distension and no mass. There is no tenderness. There is no rebound and no guarding.  Musculoskeletal: Normal range of motion. She exhibits edema. She exhibits no tenderness.  Muscle weakness to lower extremities. Trace edema RLL  Lymphadenopathy:    She has no cervical adenopathy.  Neurological: She is alert and oriented to person, place, and time. No cranial nerve deficit. Coordination normal.  Speech impaired: hesitant, gutteral, difficult too understand.   Skin: Skin is warm and dry. No rash noted. She is not diaphoretic. No erythema. No pallor.  Psychiatric: She has a normal mood and affect. Her behavior is normal.  Expresses several times that she is angry about her rent move and that she feels like it is motivated by money to the institution.    Labs reviewed:  Recent Labs  03/07/16 1611 03/07/16 2247 03/08/16 0512 05/29/16  NA 142 139 139 138  K 4.8 4.1 3.8 3.3*  CL 108 105 106  --   CO2 --   GLUCOSE 113* 109* 101*  --   BUN CREATININE 0.63 0.70 0.67 0.6  CALCIUM 8.8 9.0 8.6*  --     Recent Labs  01/01/16 1446 03/07/16 2247 03/08/16 0512 05/29/16  AST 16 38 39 21  ALT 11 61* 57* 21  ALKPHOS 88 127* 119 84  BILITOT 0.2 0.7 0.5  --   PROT 7.0  6.5 5.9*  --   ALBUMIN 4.0 3.1* 2.7*  --     Recent Labs  03/07/16 1611  03/09/16 0602 03/28/16 1715 04/11/16 1506 05/13/16 05/29/16 06/12/16  WBC 8.8  < > 7.5 6.5 5.9 7.3 5.9 6.0  NEUTROABS 7.2  --   --  4.1 4.0  --   --   --   HGB 5.9 Repeated and verified X2.*  < > 8.1* 12.0 12.4 10.6* 11.6* 10.5*  HCT  18.6 Repeated and verified X2.*  < > 25.6* 38.0 38.2 34* 36 32*  MCV 82.6  < > 85.0 85.1 85.1  --   --   --   PLT 343.0  < > 249 191.0 153.0 163 183 171  < > = values in this interval not displayed. Lab Results  Component Value Date   TSH 2.01 01/01/2016   No results found for: HGBA1C Lab Results  Component Value Date   CHOL 260 (H) 04/11/2015   HDL 92.50 04/11/2015   LDLCALC 148 (H) 04/11/2015   LDLDIRECT 189.2 12/12/2008   TRIG 98.0 04/11/2015   CHOLHDL 3 04/11/2015    Significant Diagnostic Results in last 30 days:  No results found.  Assessment/Plan Closed right fibular fracture 06/20/16 X-ray nondisplaced fracture of the proximal fibular shaft. The patient declined Ortho consultation. Denied pain or discomfort. Will apply the right knee immobilizer. Observe.   Anxiety state managed with Alprazolam 0.5mg  tid and q6h prn, effective  Gait disorder w/c for mobility, self transferring.   Anemia 06/12/16 Hgb 10.5  GERD (gastroesophageal reflux disease) Dc Protonix-patient's refusal.   Constipation 05/19/16 dc MiraLax and Colace-pt's refusal.  05/30/16 negative toxigenic C difficile, no enteric campylocacter, Salmonella, or shigella.    COPD GOLD II  Maintained on Tessalon tid prn, Robitussin q4h prn, DuoNeb qid, dc Prednisone  qd-refusal.   Essential hypertension Controlled, dc Losartan  qd, Furosemide  daily-patient's refusal.   Failure to thrive in adult Continue comfort measures, Hospice Service, continue Alprazolam.      Family/ staff Communication: SNF  Labs/tests ordered: CBC CMP UA C/S

## 2016-06-23 NOTE — Assessment & Plan Note (Signed)
Controlled, dc Losartan  qd, Furosemide  daily-patient's refusal.

## 2016-06-23 NOTE — Assessment & Plan Note (Signed)
Maintained on Tessalon tid prn, Robitussin q4h prn, DuoNeb qid, dc Prednisone  qd-refusal.

## 2016-06-23 NOTE — Assessment & Plan Note (Signed)
Continue comfort measures, Hospice Service, continue Alprazolam.

## 2016-06-24 DIAGNOSIS — R29898 Other symptoms and signs involving the musculoskeletal system: Secondary | ICD-10-CM | POA: Diagnosis not present

## 2016-06-24 DIAGNOSIS — R630 Anorexia: Secondary | ICD-10-CM | POA: Diagnosis not present

## 2016-06-24 DIAGNOSIS — I352 Nonrheumatic aortic (valve) stenosis with insufficiency: Secondary | ICD-10-CM | POA: Diagnosis not present

## 2016-06-24 DIAGNOSIS — R2681 Unsteadiness on feet: Secondary | ICD-10-CM | POA: Diagnosis not present

## 2016-06-24 DIAGNOSIS — R278 Other lack of coordination: Secondary | ICD-10-CM | POA: Diagnosis not present

## 2016-06-24 DIAGNOSIS — J441 Chronic obstructive pulmonary disease with (acute) exacerbation: Secondary | ICD-10-CM | POA: Diagnosis not present

## 2016-06-24 DIAGNOSIS — R0902 Hypoxemia: Secondary | ICD-10-CM | POA: Diagnosis not present

## 2016-06-24 DIAGNOSIS — I509 Heart failure, unspecified: Secondary | ICD-10-CM | POA: Diagnosis not present

## 2016-06-24 DIAGNOSIS — D500 Iron deficiency anemia secondary to blood loss (chronic): Secondary | ICD-10-CM | POA: Diagnosis not present

## 2016-07-03 ENCOUNTER — Telehealth: Payer: Self-pay | Admitting: Nurse Practitioner

## 2016-07-03 NOTE — Telephone Encounter (Signed)
Spoke with the POA, daughter of Kandis MannanGertrude Zeiders: not to stop any of her medications regardless she is taking or not in the future.

## 2016-07-04 ENCOUNTER — Encounter (HOSPITAL_COMMUNITY): Payer: Self-pay | Admitting: Emergency Medicine

## 2016-07-04 ENCOUNTER — Emergency Department (HOSPITAL_COMMUNITY)
Admission: EM | Admit: 2016-07-04 | Discharge: 2016-07-04 | Disposition: A | Discharge status: Home / Self Care | Attending: Emergency Medicine | Admitting: Emergency Medicine

## 2016-07-04 ENCOUNTER — Emergency Department (HOSPITAL_COMMUNITY)

## 2016-07-04 DIAGNOSIS — Z23 Encounter for immunization: Secondary | ICD-10-CM | POA: Diagnosis not present

## 2016-07-04 DIAGNOSIS — Y929 Unspecified place or not applicable: Secondary | ICD-10-CM | POA: Insufficient documentation

## 2016-07-04 DIAGNOSIS — S0993XA Unspecified injury of face, initial encounter: Secondary | ICD-10-CM | POA: Diagnosis present

## 2016-07-04 DIAGNOSIS — S0990XA Unspecified injury of head, initial encounter: Secondary | ICD-10-CM | POA: Insufficient documentation

## 2016-07-04 DIAGNOSIS — J449 Chronic obstructive pulmonary disease, unspecified: Secondary | ICD-10-CM | POA: Insufficient documentation

## 2016-07-04 DIAGNOSIS — W050XXA Fall from non-moving wheelchair, initial encounter: Secondary | ICD-10-CM | POA: Insufficient documentation

## 2016-07-04 DIAGNOSIS — Z87891 Personal history of nicotine dependence: Secondary | ICD-10-CM | POA: Insufficient documentation

## 2016-07-04 DIAGNOSIS — Y939 Activity, unspecified: Secondary | ICD-10-CM | POA: Insufficient documentation

## 2016-07-04 DIAGNOSIS — I10 Essential (primary) hypertension: Secondary | ICD-10-CM | POA: Insufficient documentation

## 2016-07-04 DIAGNOSIS — Y999 Unspecified external cause status: Secondary | ICD-10-CM | POA: Diagnosis not present

## 2016-07-04 DIAGNOSIS — S0181XA Laceration without foreign body of other part of head, initial encounter: Secondary | ICD-10-CM | POA: Diagnosis not present

## 2016-07-04 DIAGNOSIS — Z79899 Other long term (current) drug therapy: Secondary | ICD-10-CM | POA: Diagnosis not present

## 2016-07-04 DIAGNOSIS — W19XXXA Unspecified fall, initial encounter: Secondary | ICD-10-CM

## 2016-07-04 MED ORDER — ACETAMINOPHEN 500 MG PO TABS
500.0000 mg | ORAL_TABLET | Freq: Once | ORAL | Status: AC
  Administered 2016-07-04: 500 mg via ORAL
  Filled 2016-07-04: qty 1

## 2016-07-04 MED ORDER — ALPRAZOLAM 0.5 MG PO TABS
0.5000 mg | ORAL_TABLET | Freq: Once | ORAL | Status: AC
  Administered 2016-07-04: 0.5 mg via ORAL
  Filled 2016-07-04: qty 1

## 2016-07-04 MED ORDER — TETANUS-DIPHTH-ACELL PERTUSSIS 5-2.5-18.5 LF-MCG/0.5 IM SUSP
0.5000 mL | Freq: Once | INTRAMUSCULAR | Status: AC
  Administered 2016-07-04: 0.5 mL via INTRAMUSCULAR
  Filled 2016-07-04: qty 0.5

## 2016-07-04 NOTE — ED Triage Notes (Signed)
Per EMS, patient from [facility], for evaluation of a fall from a sitting position from her wheel chair. Has  approx 2" lac on frontal aspect of her forehead. No reported LOC or being on anticoagulant, reportedly other dementia baseline.

## 2016-07-04 NOTE — Discharge Instructions (Signed)
Please keep your skin wound clean and dry. Wound care twice daily. I have used dermabond to keep the skin together well. This will come off on it's own - typically in about 1 week. I have provided extra steri-strips. If for some reason the dermabond comes off in a small area, you can apply steri-strips to keep wound together. Monitor for signs of infection including redness developing around the site or drainage from wound. Return to ER for new or worsening symptoms, any additional concerns.

## 2016-07-04 NOTE — ED Notes (Signed)
Patient transported to CT 

## 2016-07-04 NOTE — ED Notes (Signed)
Patient and family requesting at home xanax dose.

## 2016-07-04 NOTE — ED Provider Notes (Signed)
[facility]-EMERGENCY DEPT Provider Note   CSN: [id number] Arrival date & time: 07/04/16  1451     History   Chief Complaint Chief Complaint  Patient presents with  . Fall    HPI Karina Andrews is a 83 y.o. female.  The history is provided by medical records and the EMS personnel. The history is limited by the condition of the patient and the absence of a caregiver. No language interpreter was used.  Fall  Associated symptoms include headaches.   Karina Andrews is a 83 y.o. female  who presents to the Emergency Department from SNF for evaluation following a witnessed fall from sitting in her wheelchair. Staff reports that she appeared to be rearranging herself in the chair and fell forward hitting her forehead. No LOC. Not on anti-coagulation. Did sustain a laceration to the left side of her head. Not sure of tetanus status. Denies any pain other than headache where laceration occurred.   Past Medical History:  Diagnosis Date  . Alcohol abuse   . Anemia   . Anxiety   . Aortic stenosis    moderate on echo 09/2013  . Cervical spondylosis with myelopathy 01/2009   s/p decompression  . COPD (chronic obstructive pulmonary disease) (HCC)   . Dementia 09/06/2015  . Depression   . Fall at home 05/2015  . Heart murmur   . History of blood transfusion 01/02/2016   "anemia"  . Hypertension   . On home oxygen therapy    "2L; 24/7" (01/02/2016)  . Osteoporosis    (R) hip fx 11/2009 & (L) hip fx 2006  . Speech impediment    present for >9 months; no one has really been able to discern an etiology/daughter/notes 01/01/2016    Patient Active Problem List   Diagnosis Date Noted  . Closed right fibular fracture 06/23/2016  . Failure to thrive in adult 06/23/2016  . UTI (urinary tract infection) 06/05/2016  . GERD (gastroesophageal reflux disease) 03/08/2016  . Anxiety state 01/20/2016  . Chronic respiratory failure with hypoxia (HCC) 01/15/2016  . Constipation 01/15/2016  . Anemia  01/02/2016  . Gait disorder 08/22/2015  . COPD GOLD II  08/04/2015  . Multiple fractures of ribs of right side 06/05/2015  . Encounter for palliative care   . Goals of care, counseling/discussion   . Closed C1 fracture (HCC) 06/02/2015  . Fracture of maxilla, closed (HCC) 06/02/2015  . Fall 05/31/2015  . Alcohol abuse 05/31/2015  . Dysarthria 05/17/2015  . Indeterminate pulmonary nodules 05/11/2015  . Dyspnea 04/11/2015  . Cigarette smoker 04/11/2015  . Aortic stenosis, severe 06/15/2013  . Insomnia 06/15/2013  . Senile osteoporosis   . 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 Hyperlipidemia 12/12/2008  . Depression 12/12/2008  . DEGENERATIVE DISC DISEASE, CERVICAL SPINE, WITH MYELOPATHY 12/12/2008  . NICOTINE ADDICTION 06/14/2008  . Essential hypertension 06/14/2008    Past Surgical History:  Procedure Laterality Date  . BACK SURGERY    . BLADDER SUSPENSION     "unsuccessful"  . CATARACT EXTRACTION W/ INTRAOCULAR LENS  IMPLANT, BILATERAL Bilateral 06/06/13 - 2016   "right - left"  . FRACTURE SURGERY    . HIP FRACTURE SURGERY Left 1980s  . LAPAROSCOPIC CHOLECYSTECTOMY  1995  . ORIF HIP FRACTURE Right 11/2009   Done by Dr. Turner Daniels  . POSTERIOR LAMINECTOMY / DECOMPRESSION CERVICAL SPINE  01/2009   Done by Dr. Danielle Dess  . WRIST FRACTURE SURGERY Bilateral    "one in [place]; one [place]"  OB History    No data available       Home Medications    Prior to Admission medications   Medication Sig Start Date End Date Taking? Authorizing Provider  acetaminophen (TYLENOL) 500 MG tablet Take 500 mg by mouth. Take 2 tablets as needed up to 3 times daily   Yes [provider]  ALPRAZolam (XANAX) 0.5 MG tablet Take 0.5 mg by mouth 4 (four) times daily.    Yes [provider]  benzonatate (TESSALON) 100 MG capsule Take 1 capsule by mouth 3 (three) times daily as needed for cough.  02/23/16  Yes [provider]  furosemide (LASIX) 20 MG tablet Take  20 mg by mouth daily.   Yes [provider]  guaifenesin (ROBITUSSIN) 100 MG/5ML syrup Take 200 mg by mouth. Take 10 ml every 4 hours as needed for cough   Yes [provider]  ipratropium-albuterol (DUONEB) 0.5-2.5 (3) MG/3ML SOLN Take 3 mLs by nebulization. Use nebulizer four times daily   Yes [provider]  loperamide (IMODIUM) 2 MG capsule 1 tab po bid prn diarrhea 04/19/16  Yes McGowen, Maryjean MornPhilip H, MD  Melatonin 5 MG TABS Take 5 mg by mouth at bedtime.    Yes [provider]  predniSONE (DELTASONE) 5 MG tablet Take 5 mg by mouth daily with breakfast. Take one tablet once a day    Yes [provider]  ferrous sulfate (FERROUSUL) 325 (65 FE) MG tablet Take 1 tablet (325 mg total) by mouth 2 (two) times daily with a meal. Patient not taking: Reported on 07/04/2016 03/09/16   Maretta BeesGhimire, Shanker M, MD  losartan (COZAAR) 50 MG tablet Take 1 tablet (50 mg total) by mouth daily. Patient not taking: Reported on 07/04/2016 08/22/15   Corwin LevinsJohn, James W, MD  OXYGEN Inhale 2 L/min into the lungs continuous.    [provider]  pantoprazole (PROTONIX) 40 MG tablet Take 1 tablet (40 mg total) by mouth daily. Patient not taking: Reported on 07/04/2016 01/03/16   Clydia LlanoElmahi, Mutaz, MD    Family History Family History  Problem Relation Age of Onset  . Heart attack Father     Social History Social History  Substance Use Topics  . Smoking status: Former Smoker    Packs/day: 0.50    Years: 63.00    Types: Cigarettes    Quit date: 09/10/2015  . Smokeless tobacco: Never Used  . Alcohol use 0.0 oz/week     Comment: 01/02/2016 "last drink was before 09/10/2015     Allergies   Ativan [lorazepam]   Review of Systems Review of Systems  Skin: Positive for wound.  Neurological: Positive for headaches. Negative for dizziness and syncope.  All other systems reviewed and are negative.    Physical Exam Updated Vital Signs BP (!) 163/89   Pulse 87   Resp 16    SpO2 95%   Physical Exam  Constitutional: She appears well-developed and well-nourished.  HENT:  Head: Normocephalic. Head is without raccoon's eyes and without Battle's sign.  Right Ear: No hemotympanum.  Left Ear: No hemotympanum.  Nose: Nose normal.  Mouth/Throat: Oropharynx is clear and moist.  3 cm laceration just above lateral left eyebrow.  Neck:  Towel c-collar in place. No midline tenderness.  Cardiovascular: Normal rate, regular rhythm and normal heart sounds.   No murmur heard. Pulmonary/Chest: Effort normal and breath sounds normal. No respiratory distress.  Abdominal: Soft. She exhibits no distension. There is no tenderness.  Musculoskeletal:  Moves all  extremities well. 5/5 muscle strength in all four extremities including grip strength.  Neurological: She is alert.  Slow, stuttering speech which appears to be baseline for patient with hx of speech impediment. Slightly hard of hearing but otherwise no focal neuro deficits. Normal finger-to-nose and rapid alternating movements.  Skin: Skin is warm and dry.  Nursing note and vitals reviewed.    ED Treatments / Results  Labs (all labs ordered are listed, but only abnormal results are displayed) Labs Reviewed - No data to display  EKG  EKG Interpretation None       Radiology Ct Head Wo Contrast  Result Date: 07/04/2016 CLINICAL DATA:  Head laceration after fall at nursing facility. No loss of consciousness. EXAM: CT HEAD WITHOUT CONTRAST CT CERVICAL SPINE WITHOUT CONTRAST TECHNIQUE: Multidetector CT imaging of the head and cervical spine was performed following the standard protocol without intravenous contrast. Multiplanar CT image reconstructions of the cervical spine were also generated. COMPARISON:  CT scans of June 01, 2015 and June 02, 2015. FINDINGS: CT HEAD FINDINGS Brain: Mild diffuse cortical atrophy is noted. Mild chronic ischemic white matter disease is noted. No mass effect or midline shift is noted.  Ventricular size is within normal limits. There is no evidence of mass lesion, hemorrhage or acute infarction. Vascular: Atherosclerosis of carotid siphons is noted. Skull: Normal. Negative for fracture or focal lesion. Sinuses/Orbits: No acute finding. Other: None. CT CERVICAL SPINE FINDINGS Alignment: Minimal grade 1 anterolisthesis of C3-4 is noted secondary to posterior facet joint hypertrophy. Status post surgical anterior fusion extending from C4-C7. Minimal grade 1 anterolisthesis of C7-T1 is noted secondary to posterior facet joint hypertrophy. Skull base and vertebrae: No acute fracture. No primary bone lesion or focal pathologic process. Soft tissues and spinal canal: No prevertebral fluid or swelling. No visible canal hematoma. Disc levels: Mild degenerative disc disease is noted at C3-4 and C7-T1. Status post surgical fusion of C4-5, C5-6 and C6-7. Upper chest: Negative. Other: Degenerative changes seen involving posterior facet joints. IMPRESSION: Mild diffuse cortical atrophy. Mild chronic ischemic white matter disease. No acute intracranial abnormality seen. Extensive degenerative and postsurgical changes are noted in cervical spine. No acute abnormality is noted. Electronically Signed   By: Lupita Raider, M.D.   On: 07/04/2016 16:31   Ct Cervical Spine Wo Contrast  Result Date: 07/04/2016 CLINICAL DATA:  Head laceration after fall at nursing facility. No loss of consciousness. EXAM: CT HEAD WITHOUT CONTRAST CT CERVICAL SPINE WITHOUT CONTRAST TECHNIQUE: Multidetector CT imaging of the head and cervical spine was performed following the standard protocol without intravenous contrast. Multiplanar CT image reconstructions of the cervical spine were also generated. COMPARISON:  CT scans of June 01, 2015 and June 02, 2015. FINDINGS: CT HEAD FINDINGS Brain: Mild diffuse cortical atrophy is noted. Mild chronic ischemic white matter disease is noted. No mass effect or midline shift is noted. Ventricular  size is within normal limits. There is no evidence of mass lesion, hemorrhage or acute infarction. Vascular: Atherosclerosis of carotid siphons is noted. Skull: Normal. Negative for fracture or focal lesion. Sinuses/Orbits: No acute finding. Other: None. CT CERVICAL SPINE FINDINGS Alignment: Minimal grade 1 anterolisthesis of C3-4 is noted secondary to posterior facet joint hypertrophy. Status post surgical anterior fusion extending from C4-C7. Minimal grade 1 anterolisthesis of C7-T1 is noted secondary to posterior facet joint hypertrophy. Skull base and vertebrae: No acute fracture. No primary bone lesion or focal pathologic process. Soft tissues and spinal canal: No prevertebral fluid or swelling. No  visible canal hematoma. Disc levels: Mild degenerative disc disease is noted at C3-4 and C7-T1. Status post surgical fusion of C4-5, C5-6 and C6-7. Upper chest: Negative. Other: Degenerative changes seen involving posterior facet joints. IMPRESSION: Mild diffuse cortical atrophy. Mild chronic ischemic white matter disease. No acute intracranial abnormality seen. Extensive degenerative and postsurgical changes are noted in cervical spine. No acute abnormality is noted. Electronically Signed   By: Lupita Raider, M.D.   On: 07/04/2016 16:31    Procedures Procedures (including critical care time)  LACERATION REPAIR Performed by: Chase Picket Ward Consent: Verbal consent obtained. Risks and benefits: risks, benefits and alternatives were discussed Patient identity confirmed: provided demographic data Time out performed prior to procedure Prepped and Draped in normal sterile fashion Wound explored Laceration Location: Left lateral forehead Laceration Length: 3cm No Foreign Bodies seen or palpated on wound exploration Anesthesia: none Irrigation method: syringe Amount of cleaning: standard Skin closure: three steri-strips and dermabond. Patient tolerance: Patient tolerated the procedure well with no  immediate complications.   Medications Ordered in ED Medications  Tdap (BOOSTRIX) injection 0.5 mL (0.5 mLs Intramuscular Given 07/04/16 1646)  ALPRAZolam (XANAX) tablet 0.5 mg (0.5 mg Oral Given 07/04/16 1754)     Initial Impression / Assessment and Plan / ED Course  I have reviewed the triage vital signs and the nursing notes.  Pertinent labs & imaging results that were available during my care of the patient were reviewed by me and considered in my medical decision making (see chart for details).    Karina Andrews is a 83 y.o. female who presents to ED for witnessed fall forward out of wheelchair at SNF just prior to arrival. Baseline mentation. Not on anti-coagulants. Reassuring neuro exam. Negative CT head and c-spine. Discussed suture vs. dermabond/steri-strip for 3cm fairly superficial laceration with patient and daughter at bedside who prefer dermabond/steri-strip option. Wound cleaned and repaired as dictated above with no complications. Tetanus updated. Home care instructions discussed with patient and daughter who express understanding. Return precautions discussed as well. All questions answered.   Final Clinical Impressions(s) / ED Diagnoses   Final diagnoses:  Fall  Injury of head, initial encounter  Facial laceration, initial encounter    New Prescriptions New Prescriptions   No medications on file     Ward, Chase Picket, PA-C 07/04/16 Tressia Miners, MD 07/05/16 1718

## 2016-07-04 NOTE — ED Notes (Signed)
Family at bedside. 

## 2016-07-04 NOTE — ED Notes (Signed)
Hospice RN at bedside

## 2016-07-04 NOTE — ED Notes (Signed)
[facility] called for transport.  

## 2016-07-06 DIAGNOSIS — M79652 Pain in left thigh: Secondary | ICD-10-CM | POA: Diagnosis not present

## 2016-07-06 DIAGNOSIS — M25532 Pain in left wrist: Secondary | ICD-10-CM | POA: Diagnosis not present

## 2016-07-06 DIAGNOSIS — M25559 Pain in unspecified hip: Secondary | ICD-10-CM | POA: Diagnosis not present

## 2016-07-07 ENCOUNTER — Non-Acute Institutional Stay (SKILLED_NURSING_FACILITY): Payer: Medicare Other | Admitting: Nurse Practitioner

## 2016-07-07 ENCOUNTER — Encounter: Payer: Self-pay | Admitting: Nurse Practitioner

## 2016-07-07 DIAGNOSIS — D500 Iron deficiency anemia secondary to blood loss (chronic): Secondary | ICD-10-CM

## 2016-07-07 DIAGNOSIS — N3946 Mixed incontinence: Secondary | ICD-10-CM

## 2016-07-07 DIAGNOSIS — K219 Gastro-esophageal reflux disease without esophagitis: Secondary | ICD-10-CM

## 2016-07-07 DIAGNOSIS — S0181XD Laceration without foreign body of other part of head, subsequent encounter: Secondary | ICD-10-CM

## 2016-07-07 DIAGNOSIS — K5900 Constipation, unspecified: Secondary | ICD-10-CM | POA: Diagnosis not present

## 2016-07-07 DIAGNOSIS — M5000 Cervical disc disorder with myelopathy, unspecified cervical region: Secondary | ICD-10-CM | POA: Diagnosis not present

## 2016-07-07 DIAGNOSIS — G4700 Insomnia, unspecified: Secondary | ICD-10-CM | POA: Diagnosis not present

## 2016-07-07 DIAGNOSIS — R1032 Left lower quadrant pain: Secondary | ICD-10-CM | POA: Diagnosis not present

## 2016-07-07 DIAGNOSIS — J449 Chronic obstructive pulmonary disease, unspecified: Secondary | ICD-10-CM | POA: Diagnosis not present

## 2016-07-07 DIAGNOSIS — F329 Major depressive disorder, single episode, unspecified: Secondary | ICD-10-CM

## 2016-07-07 DIAGNOSIS — R269 Unspecified abnormalities of gait and mobility: Secondary | ICD-10-CM

## 2016-07-07 DIAGNOSIS — I10 Essential (primary) hypertension: Secondary | ICD-10-CM

## 2016-07-07 DIAGNOSIS — F32A Depression, unspecified: Secondary | ICD-10-CM

## 2016-07-07 DIAGNOSIS — S0181XA Laceration without foreign body of other part of head, initial encounter: Secondary | ICD-10-CM | POA: Insufficient documentation

## 2016-07-07 NOTE — Progress Notes (Signed)
Location:  [facility] Room Number: 31 Place of Service:  SNF (31) Provider:  Skilar Marcou, Manxie  NP  Kimber Relic, MD  Patient Care Team: Kimber Relic, MD as PCP - General (Internal Medicine) Clance, Maree Krabbe, MD as Consulting Physician (Pulmonary Disease) Gean Birchwood, MD as Consulting Physician (Orthopedic Surgery) Barnett Abu, MD as Consulting Physician (Neurosurgery)  Extended Emergency Contact Information Primary Emergency Contact: Sandi Mariscal Address: [address]          [place], [place] Phone: [phone] Relation: Daughter Secondary Emergency Contact: Royston Cow[place] Mobile Phone: [phone] Relation: Relative  Code Status:  DNR Goals of care: Advanced Directive information Advanced Directives 07/07/2016  Does Patient Have a Medical Advance Directive? -  Type of Estate agent of [place];Living will  Does patient want to make changes to medical advance directive? No - Patient declined  Copy of Healthcare Power of Attorney in Chart? Yes  Would patient like information on creating a medical advance directive? -  Pre-existing out of facility DNR order (yellow form or pink MOST form) Yellow form placed in chart (order not valid for inpatient use)     Chief Complaint  Patient presents with  . Acute Visit    hosp. f/u (head injury)    HPI:  Pt is a 83 y.o. female seen today for an acute visit for ED eval 07/04/16 for facial laceration(eft lateral eyebrow) sustained from fall SN[facility]. CT head, cervical spine showed Mild diffuse cortical atrophy. Mild chronic ischemic white matter disease. No acute intracranial abnormality seen.Extensive degenerative and postsurgical changes are noted in cervical spine. No acute abnormality is noted. 07/06/16  X-ray Left wrist, femur, hip, pelvis showed no acute fracture or dislocation. c/o left groin area pain.     The  nondisplaced fracture of the proximal fibular shaft. Denied the pain or discomfort, still able to bear weight and transfer self from w/c to bed. Denied Orth consultation, refuse to take meds except Alprazolam.                                       Hx of HTN, taking Losartan 50mg , Furosemide 20mg , COPD, maintained on Tessalon, Robitussin, DuoNed, Prednisone 5mg  qd, GERD stable, taking Protonix 40mg  qd, back pain, taking Tylenol, Anemia 05/13/16 Hgb 10.6, taking Fe, depression/anxiety/insomnia, taking Alprazolam 0.5mg  tid and q6h prn    Past Medical History:  Diagnosis Date  . Alcohol abuse   . Anemia   . Anxiety   . Aortic stenosis    moderate on echo 09/2013  . Cervical spondylosis with myelopathy 01/2009   s/p decompression  . COPD (chronic obstructive pulmonary disease) (HCC)   . Dementia 09/06/2015  . Depression   . Fall at home 05/2015  . Heart murmur   . History of blood transfusion 01/02/2016   "anemia"  . Hypertension   . On home oxygen therapy    "2L; 24/7" (01/02/2016)  . Osteoporosis    (R) hip fx 11/2009 & (L) hip fx 2006  . Speech impediment    present for >9 months; no one has really been able to discern an etiology/daughter/notes 01/01/2016   Past Surgical History:  Procedure Laterality Date  . BACK SURGERY    . BLADDER SUSPENSION     "unsuccessful"  . CATARACT EXTRACTION W/ INTRAOCULAR LENS  IMPLANT, BILATERAL Bilateral 06/06/13 -  2016   "right - left"  . FRACTURE SURGERY    . HIP FRACTURE SURGERY Left 1980s  . LAPAROSCOPIC CHOLECYSTECTOMY  1995  . ORIF HIP FRACTURE Right 11/2009   Done by Dr. Turner Daniels  . POSTERIOR LAMINECTOMY / DECOMPRESSION CERVICAL SPINE  01/2009   Done by Dr. Danielle Dess  . WRIST FRACTURE SURGERY Bilateral    "one in [place]; one [place]"    Allergies  Allergen Reactions  . Ativan [Lorazepam] Other (See Comments)    Causes increased confusion/AMS    Outpatient Encounter Prescriptions as of 07/07/2016  Medication Sig  . acetaminophen (TYLENOL)  500 MG tablet Take 500 mg by mouth. Take 2 tablets as needed up to 3 times daily  . ALPRAZolam (XANAX) 0.5 MG tablet Take 0.5 mg by mouth 4 (four) times daily.   . benzonatate (TESSALON) 100 MG capsule Take 1 capsule by mouth 3 (three) times daily as needed for cough.   . furosemide (LASIX) 20 MG tablet Take 20 mg by mouth daily.  Marland Kitchen guaifenesin (ROBITUSSIN) 100 MG/5ML syrup Take 200 mg by mouth. Take 10 ml every 4 hours as needed for cough  . ipratropium-albuterol (DUONEB) 0.5-2.5 (3) MG/3ML SOLN Take 3 mLs by nebulization. Use nebulizer four times daily  . loperamide (IMODIUM) 2 MG capsule 1 tab po bid prn diarrhea  . Melatonin 5 MG TABS Take 5 mg by mouth at bedtime.   . OXYGEN Inhale 2 L/min into the lungs continuous.  . predniSONE (DELTASONE) 5 MG tablet Take 5 mg by mouth daily with breakfast. Take one tablet once a day   . [DISCONTINUED] ferrous sulfate (FERROUSUL) 325 (65 FE) MG tablet Take 1 tablet (325 mg total) by mouth 2 (two) times daily with a meal. (Patient not taking: Reported on 07/04/2016)  . [DISCONTINUED] losartan (COZAAR) 50 MG tablet Take 1 tablet (50 mg total) by mouth daily. (Patient not taking: Reported on 07/04/2016)  . [DISCONTINUED] pantoprazole (PROTONIX) 40 MG tablet Take 1 tablet (40 mg total) by mouth daily. (Patient not taking: Reported on 07/04/2016)   No facility-administered encounter medications on file as of 07/07/2016.     Review of Systems  Constitutional: Negative for activity change, appetite change, chills, diaphoresis, fatigue, fever and unexpected weight change.  HENT: Negative for congestion, ear discharge, ear pain, hearing loss, postnasal drip, rhinorrhea, sinus pressure, sneezing, sore throat, tinnitus, trouble swallowing and voice change.   Eyes: Negative for pain, discharge, redness, itching and visual disturbance.  Respiratory: Positive for cough and shortness of breath. Negative for choking, chest tightness and wheezing.        COPD with O2  dependency  Cardiovascular: Positive for leg swelling. Negative for chest pain and palpitations.  Gastrointestinal: Negative.  Negative for abdominal distention, abdominal pain, constipation, diarrhea and nausea.  Endocrine: Negative for cold intolerance, heat intolerance, polydipsia, polyphagia and polyuria.  Genitourinary: Negative.  Negative for difficulty urinating, dysuria, flank pain, frequency, hematuria, pelvic pain, urgency and vaginal discharge.  Musculoskeletal: Positive for arthralgias and gait problem. Negative for back pain, myalgias, neck pain and neck stiffness.       Wears Hard collar, pain in left groin area, no reduced ROM hip, knee, ankle.   Skin: Negative for color change, pallor and rash.       Sustained a facial laceration from fall [facility]  Allergic/Immunologic: Negative.   Neurological: Positive for weakness. Negative for dizziness, tremors, seizures, syncope, numbness and headaches.       Cognitive changes. Speech impediment of unknown  etiology.  Hematological: Negative for adenopathy. Does not bruise/bleed easily.       Anemic in Jan 2018 with Hgb 5.9.thought due to UGI bleed. Endoscopy was not done. She was transfused 2 units PRBC. Hgb now normal.   Psychiatric/Behavioral: Negative.  Negative for agitation, behavioral problems, confusion, dysphoric mood, hallucinations, sleep disturbance and suicidal ideas. The patient is not nervous/anxious and is not hyperactive.     Immunization History  Administered Date(s) Administered  . Influenza Split 11/03/2011, 11/17/2013, 11/25/2014  . Influenza-Unspecified 12/12/2015, 12/19/2015  . PPD Test 06/06/2015, 06/20/2015  . Pneumococcal Polysaccharide-23 09/17/2013  . Tdap 07/04/2016   Pertinent  Health Maintenance Due  Topic Date Due  . PNA vac Low Risk Adult (2 of 2 - PCV13) 09/18/2014  . INFLUENZA VACCINE  09/24/2016  . DEXA SCAN  Completed   Fall Risk  04/11/2015  Falls in the past year? No   Functional Status  Survey:    Vitals:   07/07/16 1219  BP: 136/76  Pulse: 88  Temp: 98.7 F (37.1 C)  SpO2: 96%  Weight: 139 lb 4.8 oz (63.2 kg)  Height: 5\' 3"  (1.6 m)   Body mass index is 24.68 kg/m. Physical Exam  Constitutional: No distress.  Frail elderly   HENT:  Head: Normocephalic.  Right Ear: External ear normal.  Left Ear: External ear normal.  Nose: Nose normal.  Mouth/Throat: Oropharynx is clear and moist. No oropharyngeal exudate.  Eyes: Conjunctivae and EOM are normal. Pupils are equal, round, and reactive to light. Right eye exhibits no discharge. Left eye exhibits no discharge. No scleral icterus.  Neck: No JVD present. No tracheal deviation present. No thyromegaly present.  Hard collar in place.  Cardiovascular: Normal rate, regular rhythm and intact distal pulses.  Exam reveals no gallop and no friction rub.   Murmur heard. Systolic murmur 3-4/6  Pulmonary/Chest: Effort normal and breath sounds normal. No respiratory distress. She has no wheezes. She has no rales. She exhibits no tenderness.  Diffused decreased breath sounds, O2 dependent.   Abdominal: Soft. Bowel sounds are normal. She exhibits no distension and no mass. There is no tenderness. There is no rebound and no guarding.  Musculoskeletal: Normal range of motion. She exhibits edema. She exhibits no tenderness.  Muscle weakness to lower extremities. Trace edema RLL  Lymphadenopathy:    She has no cervical adenopathy.  Neurological: She is alert. No cranial nerve deficit. Coordination normal.  Speech impaired: hesitant, gutteral, difficult too understand.   Skin: Skin is warm and dry. No rash noted. She is not diaphoretic. No erythema. No pallor.  Left lateral eyebrow laceration, wound closure is intact.   Psychiatric: She has a normal mood and affect. Her behavior is normal.  Expresses several times that she is angry about her rent move and that she feels like it is motivated by money to the institution.    Labs  reviewed:  Recent Labs  03/07/16 1611 03/07/16 2247 03/08/16 0512 05/29/16  NA 142 139 139 138  K 4.8 4.1 3.8 3.3*  CL 108 105 106  --   CO2 28 24 26   --   GLUCOSE 113* 109* 101*  --   BUN 22 20 16 13   CREATININE 0.63 0.70 0.67 0.6  CALCIUM 8.8 9.0 8.6*  --     Recent Labs  01/01/16 1446 03/07/16 2247 03/08/16 0512 05/29/16  AST 16 38 39 21  ALT 11 61* 57* 21  ALKPHOS 88 127* 119 84  BILITOT 0.2 0.7 0.5  --  PROT 7.0 6.5 5.9*  --   ALBUMIN 4.0 3.1* 2.7*  --     Recent Labs  03/07/16 1611  03/09/16 0602 03/28/16 1715 04/11/16 1506 05/13/16 05/29/16 06/12/16  WBC 8.8  < > 7.5 6.5 5.9 7.3 5.9 6.0  NEUTROABS 7.2  --   --  4.1 4.0  --   --   --   HGB 5.9 Repeated and verified X2.*  < > 8.1* 12.0 12.4 10.6* 11.6* 10.5*  HCT 18.6 Repeated and verified X2.*  < > 25.6* 38.0 38.2 34* 36 32*  MCV 82.6  < > 85.0 85.1 85.1  --   --   --   PLT 343.0  < > 249 191.0 153.0 163 183 171  < > = values in this interval not displayed. Lab Results  Component Value Date   TSH 2.01 01/01/2016   No results found for: HGBA1C Lab Results  Component Value Date   CHOL 260 (H) 04/11/2015   HDL 92.50 04/11/2015   LDLCALC 148 (H) 04/11/2015   LDLDIRECT 189.2 12/12/2008   TRIG 98.0 04/11/2015   CHOLHDL 3 04/11/2015    Significant Diagnostic Results in last 30 days:  Ct Head Wo Contrast  Result Date: 07/04/2016 CLINICAL DATA:  Head laceration after fall at nursing facility. No loss of consciousness. EXAM: CT HEAD WITHOUT CONTRAST CT CERVICAL SPINE WITHOUT CONTRAST TECHNIQUE: Multidetector CT imaging of the head and cervical spine was performed following the standard protocol without intravenous contrast. Multiplanar CT image reconstructions of the cervical spine were also generated. COMPARISON:  CT scans of June 01, 2015 and June 02, 2015. FINDINGS: CT HEAD FINDINGS Brain: Mild diffuse cortical atrophy is noted. Mild chronic ischemic white matter disease is noted. No mass effect or midline  shift is noted. Ventricular size is within normal limits. There is no evidence of mass lesion, hemorrhage or acute infarction. Vascular: Atherosclerosis of carotid siphons is noted. Skull: Normal. Negative for fracture or focal lesion. Sinuses/Orbits: No acute finding. Other: None. CT CERVICAL SPINE FINDINGS Alignment: Minimal grade 1 anterolisthesis of C3-4 is noted secondary to posterior facet joint hypertrophy. Status post surgical anterior fusion extending from C4-C7. Minimal grade 1 anterolisthesis of C7-T1 is noted secondary to posterior facet joint hypertrophy. Skull base and vertebrae: No acute fracture. No primary bone lesion or focal pathologic process. Soft tissues and spinal canal: No prevertebral fluid or swelling. No visible canal hematoma. Disc levels: Mild degenerative disc disease is noted at C3-4 and C7-T1. Status post surgical fusion of C4-5, C5-6 and C6-7. Upper chest: Negative. Other: Degenerative changes seen involving posterior facet joints. IMPRESSION: Mild diffuse cortical atrophy. Mild chronic ischemic white matter disease. No acute intracranial abnormality seen. Extensive degenerative and postsurgical changes are noted in cervical spine. No acute abnormality is noted. Electronically Signed   By: Lupita Raider, M.D.   On: 07/04/2016 16:31   Ct Cervical Spine Wo Contrast  Result Date: 07/04/2016 CLINICAL DATA:  Head laceration after fall at nursing facility. No loss of consciousness. EXAM: CT HEAD WITHOUT CONTRAST CT CERVICAL SPINE WITHOUT CONTRAST TECHNIQUE: Multidetector CT imaging of the head and cervical spine was performed following the standard protocol without intravenous contrast. Multiplanar CT image reconstructions of the cervical spine were also generated. COMPARISON:  CT scans of June 01, 2015 and June 02, 2015. FINDINGS: CT HEAD FINDINGS Brain: Mild diffuse cortical atrophy is noted. Mild chronic ischemic white matter disease is noted. No mass effect or midline shift is  noted. Ventricular size is  within normal limits. There is no evidence of mass lesion, hemorrhage or acute infarction. Vascular: Atherosclerosis of carotid siphons is noted. Skull: Normal. Negative for fracture or focal lesion. Sinuses/Orbits: No acute finding. Other: None. CT CERVICAL SPINE FINDINGS Alignment: Minimal grade 1 anterolisthesis of C3-4 is noted secondary to posterior facet joint hypertrophy. Status post surgical anterior fusion extending from C4-C7. Minimal grade 1 anterolisthesis of C7-T1 is noted secondary to posterior facet joint hypertrophy. Skull base and vertebrae: No acute fracture. No primary bone lesion or focal pathologic process. Soft tissues and spinal canal: No prevertebral fluid or swelling. No visible canal hematoma. Disc levels: Mild degenerative disc disease is noted at C3-4 and C7-T1. Status post surgical fusion of C4-5, C5-6 and C6-7. Upper chest: Negative. Other: Degenerative changes seen involving posterior facet joints. IMPRESSION: Mild diffuse cortical atrophy. Mild chronic ischemic white matter disease. No acute intracranial abnormality seen. Extensive degenerative and postsurgical changes are noted in cervical spine. No acute abnormality is noted. Electronically Signed   By: Lupita RaiderJames  Green Jr, M.D.   On: 07/04/2016 16:31    Assessment/Plan Essential hypertension Controlled, Losartan 50mg  qd, Furosemide 20mg  daily, update CBC CMP UA C/S  COPD GOLD II  Maintained on Tessalon tid prn, Robitussin q4h prn, DuoNeb qid, Prednisone 5mg  qd  Constipation Off laxatives.   GERD (gastroesophageal reflux disease) Stable, continue Protonix.   DEGENERATIVE DISC DISEASE, CERVICAL SPINE, WITH MYELOPATHY on Tylenol 500mg  2tabs tid prn  Anemia Last Hgb 10.5 06/12/16, update CBC  Depression managed with Alprazolam 0.5mg  tid and q6h prn, effective  MIXED INCONTINENCE URGE AND STRESS Chronic   Insomnia managed with Alprazolam 0.5mg  tid and q6h prn  Gait disorder w/c for  mobility, self transferring.   Facial laceration It should heal, observe, update CBC CMP UA C/S  Left groin pain Tylenol 650mg  tid po, may repeat X-ray left hip and pelvis is no better.      Family/ staff Communication: SNF  Labs/tests ordered:  CBC CMP UA C/S

## 2016-07-07 NOTE — Assessment & Plan Note (Signed)
Stable, continue Protonix 

## 2016-07-07 NOTE — Assessment & Plan Note (Signed)
managed with Alprazolam 0.5mg  tid and q6h prn, effective

## 2016-07-07 NOTE — Assessment & Plan Note (Signed)
Last Hgb 10.5 06/12/16, update CBC

## 2016-07-07 NOTE — Assessment & Plan Note (Signed)
Off laxatives.

## 2016-07-07 NOTE — Assessment & Plan Note (Signed)
Controlled, Losartan 50mg  qd, Furosemide 20mg  daily, update CBC CMP UA C/S

## 2016-07-07 NOTE — Assessment & Plan Note (Signed)
managed with Alprazolam 0.5mg  tid and q6h prn

## 2016-07-07 NOTE — Assessment & Plan Note (Signed)
Maintained on Tessalon tid prn, Robitussin q4h prn, DuoNeb qid, Prednisone 5mg qd 

## 2016-07-07 NOTE — Assessment & Plan Note (Signed)
Chronic. 

## 2016-07-07 NOTE — Assessment & Plan Note (Signed)
It should heal, observe, update CBC CMP UA C/S

## 2016-07-07 NOTE — Assessment & Plan Note (Signed)
on Tylenol 500mg  2tabs tid prn

## 2016-07-07 NOTE — Assessment & Plan Note (Signed)
w/c for mobility, self transferring.

## 2016-07-07 NOTE — Assessment & Plan Note (Signed)
Tylenol 650mg  tid po, may repeat X-ray left hip and pelvis is no better.

## 2016-07-08 DIAGNOSIS — J441 Chronic obstructive pulmonary disease with (acute) exacerbation: Secondary | ICD-10-CM | POA: Diagnosis not present

## 2016-07-08 DIAGNOSIS — D500 Iron deficiency anemia secondary to blood loss (chronic): Secondary | ICD-10-CM | POA: Diagnosis not present

## 2016-07-08 DIAGNOSIS — N390 Urinary tract infection, site not specified: Secondary | ICD-10-CM | POA: Diagnosis not present

## 2016-07-08 LAB — BASIC METABOLIC PANEL
BUN: 16 mg/dL (ref 4–21)
Creatinine: 0.5 mg/dL (ref <=1.1)
GLUCOSE: 85 mg/dL
Potassium: 3.1 mmol/L — AB (ref 3.4–5.3)
SODIUM: 142 mmol/L (ref 137–147)

## 2016-07-08 LAB — HEPATIC FUNCTION PANEL
ALK PHOS: 89 U/L (ref 25–125)
ALT: 24 U/L (ref 7–35)
AST: 25 U/L (ref 13–35)
BILIRUBIN, TOTAL: 0.5 mg/dL

## 2016-07-08 LAB — CBC AND DIFFERENTIAL
HEMATOCRIT: 33 % — AB (ref 36–46)
Hemoglobin: 10.4 g/dL — AB (ref 12.0–16.0)
Platelets: 161 10*3/uL (ref 150–399)
WBC: 6.2 10^3/mL

## 2016-07-09 ENCOUNTER — Encounter: Payer: Self-pay | Admitting: Nurse Practitioner

## 2016-07-09 DIAGNOSIS — E876 Hypokalemia: Secondary | ICD-10-CM | POA: Insufficient documentation

## 2016-07-10 ENCOUNTER — Encounter: Payer: Self-pay | Admitting: Internal Medicine

## 2016-07-10 ENCOUNTER — Non-Acute Institutional Stay (SKILLED_NURSING_FACILITY): Payer: Medicare Other | Admitting: Internal Medicine

## 2016-07-10 ENCOUNTER — Other Ambulatory Visit: Payer: Self-pay | Admitting: *Deleted

## 2016-07-10 DIAGNOSIS — I350 Nonrheumatic aortic (valve) stenosis: Secondary | ICD-10-CM

## 2016-07-10 DIAGNOSIS — R531 Weakness: Secondary | ICD-10-CM

## 2016-07-10 DIAGNOSIS — E876 Hypokalemia: Secondary | ICD-10-CM

## 2016-07-10 DIAGNOSIS — F411 Generalized anxiety disorder: Secondary | ICD-10-CM | POA: Diagnosis not present

## 2016-07-10 DIAGNOSIS — R195 Other fecal abnormalities: Secondary | ICD-10-CM

## 2016-07-10 DIAGNOSIS — S82424S Nondisplaced transverse fracture of shaft of right fibula, sequela: Secondary | ICD-10-CM | POA: Diagnosis not present

## 2016-07-10 DIAGNOSIS — J449 Chronic obstructive pulmonary disease, unspecified: Secondary | ICD-10-CM | POA: Diagnosis not present

## 2016-07-10 DIAGNOSIS — R1310 Dysphagia, unspecified: Secondary | ICD-10-CM | POA: Diagnosis not present

## 2016-07-10 DIAGNOSIS — G4700 Insomnia, unspecified: Secondary | ICD-10-CM | POA: Diagnosis not present

## 2016-07-10 DIAGNOSIS — D500 Iron deficiency anemia secondary to blood loss (chronic): Secondary | ICD-10-CM | POA: Diagnosis not present

## 2016-07-10 LAB — CBC AND DIFFERENTIAL
HCT: 31 % — AB (ref 36–46)
HEMOGLOBIN: 10.5 g/dL — AB (ref 12.0–16.0)
Platelets: 181 10*3/uL (ref 150–399)
WBC: 6.2 10*3/mL

## 2016-07-10 NOTE — Progress Notes (Signed)
LOCATION: [facility]  PCP: Kimber RelicGreen, Arthur G, MD   Code Status: DNR  Goals of care: Advanced Directive information Advanced Directives 07/10/2016  Does Patient Have a Medical Advance Directive? Yes  Type of Advance Directive Out of facility DNR (pink MOST or yellow form)  Does patient want to make changes to medical advance directive? -  Copy of Healthcare Power of Attorney in Chart? -  Would patient like information on creating a medical advance directive? -  Pre-existing out of facility DNR order (yellow form or pink MOST form) -       Extended Emergency Contact Information Primary Emergency Contact: Cuellar,Kristina Address: [address]          [place] Mobile Phone: [phone] Relation: Daughter Secondary Emergency Contact: Royston Cowperuellar,Oscar  [place] Mobile Phone: [phone] Relation: Relative   Allergies  Allergen Reactions  . Ativan [Lorazepam] Other (See Comments)    Causes increased confusion/AMS    Chief Complaint  Patient presents with  . New Admit To SNF    New Admission Visit      HPI:  Patient is a 83 y.o. female seen today for long term care post ED visit post fall. She was having headache and sustained laceration to left forehead post fall from wheelchair. CT head and C-spine were negative for acute abnormalities. dermabond was applied to her forehead laceration. Of note she had a fall prior to this ED visit as well few days back and sustained non displaced right proximal fibula shaft fracture on 06/20/16. She is under hospice services. She is seen in her room today with her caregiver at bedside. She has some dysarthria and this somewhat limits her HPI and ROS.   Review of Systems:  Constitutional: Negative for fever, diaphoresis.  HENT: Negative for headache, congestion, nasal discharge, difficulty swallowing.   Eyes: Negative for eye pain and discharge. Positive for blurred  vision.  Respiratory: Negative for shortness of breath and wheezing. Positive for cough with phlegm, mostly clear. Complaints of increased cough with meals.   Cardiovascular: Negative for chest pain, palpitations, leg swelling.  Gastrointestinal: Negative for heartburn, nausea, vomiting, abdominal pain, loss of appetite.positve for loose stool  x 1 month with 2-3 bowel movement episodes a day. Genitourinary: Negative for dysuria.  Musculoskeletal: Negative for back pain. Positive for leg pain and left groin pain  Skin: Negative for itching, rash.  Neurological: Negative for dizziness. Psychiatric/Behavioral: Negative for depression   Past Medical History:  Diagnosis Date  . Alcohol abuse   . Anemia   . Anxiety   . Aortic stenosis    moderate on echo 09/2013  . Cervical spondylosis with myelopathy 01/2009   s/p decompression  . COPD (chronic obstructive pulmonary disease) (HCC)   . Dementia 09/06/2015  . Depression   . Fall at home 05/2015  . Heart murmur   . History of blood transfusion 01/02/2016   "anemia"  . Hypertension   . On home oxygen therapy    "2L; 24/7" (01/02/2016)  . Osteoporosis    (R) hip fx 11/2009 & (L) hip fx 2006  . Speech impediment    present for >9 months; no one has really been able to discern an etiology/daughter/notes 01/01/2016   Past Surgical History:  Procedure Laterality Date  . BACK SURGERY    . BLADDER SUSPENSION     "unsuccessful"  . CATARACT EXTRACTION W/ INTRAOCULAR LENS  IMPLANT, BILATERAL Bilateral 06/06/13 - 2016   "  right - left"  . FRACTURE SURGERY    . HIP FRACTURE SURGERY Left 1980s  . LAPAROSCOPIC CHOLECYSTECTOMY  1995  . ORIF HIP FRACTURE Right 11/2009   Done by Dr. Turner Daniels  . POSTERIOR LAMINECTOMY / DECOMPRESSION CERVICAL SPINE  01/2009   Done by Dr. Danielle Dess  . WRIST FRACTURE SURGERY Bilateral    "one in [place]; one [place]"   Social History:   reports that she quit smoking about 10 months ago. Her smoking use included Cigarettes.  She has a 31.50 pack-year smoking history. She has never used smokeless tobacco. She reports that she drinks alcohol. She reports that she does not use drugs.  Family History  Problem Relation Age of Onset  . Heart attack Father     Medications: Allergies as of 07/10/2016      Reactions   Ativan [lorazepam] Other (See Comments)   Causes increased confusion/AMS      Medication List       Accurate as of 07/10/16  4:02 PM. Always use your most recent med list.          acetaminophen 500 MG tablet Commonly known as:  TYLENOL Take 1,000 mg by mouth 3 (three) times daily as needed. Take 2 tablets as needed up to 3 times daily   acetaminophen 325 MG tablet Commonly known as:  TYLENOL Take 650 mg by mouth 3 (three) times daily. Give at 8 am, 1 pm and 6 pm   ALPRAZolam 0.5 MG tablet Commonly known as:  XANAX Take 0.5 mg by mouth every 6 (six) hours as needed.   benzonatate 100 MG capsule Commonly known as:  TESSALON Take 1 capsule by mouth 3 (three) times daily. Give at 8 am, 2 pm and 8 pm   furosemide 20 MG tablet Commonly known as:  LASIX Take 20 mg by mouth daily.   guaifenesin 100 MG/5ML syrup Commonly known as:  ROBITUSSIN Take 200 mg by mouth. Take 10 ml every 4 hours as needed for cough   ipratropium-albuterol 0.5-2.5 (3) MG/3ML Soln Commonly known as:  DUONEB Take 3 mLs by nebulization. Use nebulizer four times daily   loperamide 2 MG capsule Commonly known as:  IMODIUM 1 tab po bid prn diarrhea   Melatonin 5 MG Tabs Take 5 mg by mouth at bedtime.   OXYGEN Inhale 2 L/min into the lungs continuous.   potassium chloride 10 MEQ tablet Commonly known as:  K-DUR Take 10 mEq by mouth daily.   predniSONE 5 MG tablet Commonly known as:  DELTASONE Take 5 mg by mouth daily with breakfast. Take one tablet once a day       Immunizations: Immunization History  Administered Date(s) Administered  . Influenza Split 11/03/2011, 11/17/2013, 11/25/2014  .  Influenza-Unspecified 12/12/2015, 12/19/2015  . PPD Test 06/06/2015, 06/20/2015  . Pneumococcal Polysaccharide-23 09/17/2013  . Tdap 07/04/2016     Physical Exam: Vitals:   07/10/16 1408  BP: 136/76  Pulse: 80  Resp: 18  Temp: 98.7 F (37.1 C)  TempSrc: Oral  SpO2: 95%  Weight: 139 lb 4.8 oz (63.2 kg)  Height: 5\' 3"  (1.6 m)   Body mass index is 24.68 kg/m.  General- elderly female, frail and thin built, in no acute distress Head- normocephalic, atraumatic Nose- no maxillary or frontal sinus tenderness, no nasal discharge Throat- moist mucus membrane, normal oropharynx, has dentures  Eyes- PERRLA, EOMI, no pallor, no icterus, no discharge, normal conjunctiva, normal sclera Neck- no cervical lymphadenopathy Cardiovascular- normal s1,s2, + systolic  murmur Respiratory- bilateral poor air movement, occasional wheeze, no rhonchi, no crackles, no use of accessory muscles Abdomen- bowel sounds present, soft, non tender, no guarding or rigidity Musculoskeletal- able to move all 4 extremities, generalized weakness more to lower extremities, trace leg edema, arthritis changes to her fingers Neurological- alert and oriented to person, place and time, dysarthria + Skin- warm and dry, easy bruising Psychiatry- anxiety present    Labs reviewed: Basic Metabolic Panel:  Recent Labs  16/10/96 1611 03/07/16 2247 03/08/16 0512 05/29/16 07/08/16  NA 142 139 139 138 142  K 4.8 4.1 3.8 3.3* 3.1*  CL 108 105 106  --   --   CO2 28 24 26   --   --   GLUCOSE 113* 109* 101*  --   --   BUN 22 20 16 13 16   CREATININE 0.63 0.70 0.67 0.6 0.5  CALCIUM 8.8 9.0 8.6*  --   --    Liver Function Tests:  Recent Labs  01/01/16 1446 03/07/16 2247 03/08/16 0512 05/29/16 07/08/16  AST 16 38 39 21 25  ALT 11 61* 57* 21 24  ALKPHOS 88 127* 119 84 89  BILITOT 0.2 0.7 0.5  --   --   PROT 7.0 6.5 5.9*  --   --   ALBUMIN 4.0 3.1* 2.7*  --   --    No results for input(s): LIPASE, AMYLASE in the  last 8760 hours. No results for input(s): AMMONIA in the last 8760 hours. CBC:  Recent Labs  03/07/16 1611  03/09/16 0602 03/28/16 1715 04/11/16 1506  05/29/16 06/12/16 07/08/16  WBC 8.8  < > 7.5 6.5 5.9  < > 5.9 6.0 6.2  NEUTROABS 7.2  --   --  4.1 4.0  --   --   --   --   HGB 5.9 Repeated and verified X2.*  < > 8.1* 12.0 12.4  < > 11.6* 10.5* 10.4*  HCT 18.6 Repeated and verified X2.*  < > 25.6* 38.0 38.2  < > 36 32* 33*  MCV 82.6  < > 85.0 85.1 85.1  --   --   --   --   PLT 343.0  < > 249 191.0 153.0  < > 183 171 161  < > = values in this interval not displayed. Cardiac Enzymes: No results for input(s): CKTOTAL, CKMB, CKMBINDEX, TROPONINI in the last 8760 hours. BNP: Invalid input(s): POCBNP CBG:  Recent Labs  03/08/16 0431 03/08/16 0740 03/09/16 0719  GLUCAP 99 91 99    Radiological Exams: Ct Head Wo Contrast  Result Date: 07/04/2016 CLINICAL DATA:  Head laceration after fall at nursing facility. No loss of consciousness. EXAM: CT HEAD WITHOUT CONTRAST CT CERVICAL SPINE WITHOUT CONTRAST TECHNIQUE: Multidetector CT imaging of the head and cervical spine was performed following the standard protocol without intravenous contrast. Multiplanar CT image reconstructions of the cervical spine were also generated. COMPARISON:  CT scans of June 01, 2015 and June 02, 2015. FINDINGS: CT HEAD FINDINGS Brain: Mild diffuse cortical atrophy is noted. Mild chronic ischemic white matter disease is noted. No mass effect or midline shift is noted. Ventricular size is within normal limits. There is no evidence of mass lesion, hemorrhage or acute infarction. Vascular: Atherosclerosis of carotid siphons is noted. Skull: Normal. Negative for fracture or focal lesion. Sinuses/Orbits: No acute finding. Other: None. CT CERVICAL SPINE FINDINGS Alignment: Minimal grade 1 anterolisthesis of C3-4 is noted secondary to posterior facet joint hypertrophy. Status post surgical anterior fusion extending  from  C4-C7. Minimal grade 1 anterolisthesis of C7-T1 is noted secondary to posterior facet joint hypertrophy. Skull base and vertebrae: No acute fracture. No primary bone lesion or focal pathologic process. Soft tissues and spinal canal: No prevertebral fluid or swelling. No visible canal hematoma. Disc levels: Mild degenerative disc disease is noted at C3-4 and C7-T1. Status post surgical fusion of C4-5, C5-6 and C6-7. Upper chest: Negative. Other: Degenerative changes seen involving posterior facet joints. IMPRESSION: Mild diffuse cortical atrophy. Mild chronic ischemic white matter disease. No acute intracranial abnormality seen. Extensive degenerative and postsurgical changes are noted in cervical spine. No acute abnormality is noted. Electronically Signed   By: Lupita Raider, M.D.   On: 07/04/2016 16:31   Ct Cervical Spine Wo Contrast  Result Date: 07/04/2016 CLINICAL DATA:  Head laceration after fall at nursing facility. No loss of consciousness. EXAM: CT HEAD WITHOUT CONTRAST CT CERVICAL SPINE WITHOUT CONTRAST TECHNIQUE: Multidetector CT imaging of the head and cervical spine was performed following the standard protocol without intravenous contrast. Multiplanar CT image reconstructions of the cervical spine were also generated. COMPARISON:  CT scans of June 01, 2015 and June 02, 2015. FINDINGS: CT HEAD FINDINGS Brain: Mild diffuse cortical atrophy is noted. Mild chronic ischemic white matter disease is noted. No mass effect or midline shift is noted. Ventricular size is within normal limits. There is no evidence of mass lesion, hemorrhage or acute infarction. Vascular: Atherosclerosis of carotid siphons is noted. Skull: Normal. Negative for fracture or focal lesion. Sinuses/Orbits: No acute finding. Other: None. CT CERVICAL SPINE FINDINGS Alignment: Minimal grade 1 anterolisthesis of C3-4 is noted secondary to posterior facet joint hypertrophy. Status post surgical anterior fusion extending from C4-C7.  Minimal grade 1 anterolisthesis of C7-T1 is noted secondary to posterior facet joint hypertrophy. Skull base and vertebrae: No acute fracture. No primary bone lesion or focal pathologic process. Soft tissues and spinal canal: No prevertebral fluid or swelling. No visible canal hematoma. Disc levels: Mild degenerative disc disease is noted at C3-4 and C7-T1. Status post surgical fusion of C4-5, C5-6 and C6-7. Upper chest: Negative. Other: Degenerative changes seen involving posterior facet joints. IMPRESSION: Mild diffuse cortical atrophy. Mild chronic ischemic white matter disease. No acute intracranial abnormality seen. Extensive degenerative and postsurgical changes are noted in cervical spine. No acute abnormality is noted. Electronically Signed   By: Lupita Raider, M.D.   On: 07/04/2016 16:31   Left Wrist, 3 Views 07/06/16 Impression. No acute fracture or dislocation.  Left Femur, 2 Views 07/06/16 Impression. Postoperative changes. No acute fracture or dislocation.  Left Hip , 2 Views 07/06/16 Impression. No acute fracture or dislocation.  Pelvis Single View, AP supine exposure. 07/06/16 Impression. No acute fracture or dislocation seen.    Assessment/Plan  Generalized weakness With deconditioning. She is a high fall risk and has advanced COPD and AS. She is under hospice service. Supportive care for now  Severe Aortic stenosis Continue o2 to help with dyspnea. Continue lasix with kcl. Monitor bmp  COPD Stable breathing, no signs of exacerbation at present. Continue o2 by nasal canula and chronic prednisone. Continue robitussin dm prn cough. Continue duoneb bid  Hypokalemia Currently on kc 10 meq daily, increase this to 20 meq daily and check bmp 07/14/16  Proximal non displaced right fibula shaft fracture Controlled pain. Continue tylenol 650 mg tid. Patient has refused immobilizer. NWB to RLE. Continue prn tylenol  Dysphagia Aspiration precautions, continue regular diet with  thin liquid  Loose stool Infectious  workup negative. Possible IBS with her history of anxiety. Continue imodium 2 mg bid prn  Insomnia Continue melatonin  GAD Continue xanax 0.5 mg qid, monitor for sedation and fall.    Goals of care: long term care  Labs/tests ordered: bmp 07/14/16   Family/ staff Communication: reviewed care plan with patient, her caregiver and nursing supervisor    Oneal Grout, MD Internal Medicine [facility] [address] [place], [place] [address] Cell Phone (Monday-Friday 8 am - 5 pm): [phone] On Call: [phone] and follow prompts after 5 pm and on weekends Office Phone: [phone] Office Fax: [phone]

## 2016-07-11 ENCOUNTER — Ambulatory Visit: Payer: Medicare HMO | Admitting: Internal Medicine

## 2016-07-14 ENCOUNTER — Other Ambulatory Visit: Payer: Self-pay | Admitting: *Deleted

## 2016-07-14 ENCOUNTER — Non-Acute Institutional Stay (SKILLED_NURSING_FACILITY): Payer: Medicare Other | Admitting: Nurse Practitioner

## 2016-07-14 DIAGNOSIS — N390 Urinary tract infection, site not specified: Secondary | ICD-10-CM | POA: Diagnosis not present

## 2016-07-14 DIAGNOSIS — I10 Essential (primary) hypertension: Secondary | ICD-10-CM

## 2016-07-14 DIAGNOSIS — R1032 Left lower quadrant pain: Secondary | ICD-10-CM

## 2016-07-14 DIAGNOSIS — K219 Gastro-esophageal reflux disease without esophagitis: Secondary | ICD-10-CM

## 2016-07-14 DIAGNOSIS — D500 Iron deficiency anemia secondary to blood loss (chronic): Secondary | ICD-10-CM

## 2016-07-14 DIAGNOSIS — K5900 Constipation, unspecified: Secondary | ICD-10-CM

## 2016-07-14 DIAGNOSIS — S0181XS Laceration without foreign body of other part of head, sequela: Secondary | ICD-10-CM

## 2016-07-14 DIAGNOSIS — F329 Major depressive disorder, single episode, unspecified: Secondary | ICD-10-CM

## 2016-07-14 DIAGNOSIS — J449 Chronic obstructive pulmonary disease, unspecified: Secondary | ICD-10-CM

## 2016-07-14 DIAGNOSIS — F32A Depression, unspecified: Secondary | ICD-10-CM

## 2016-07-14 DIAGNOSIS — I352 Nonrheumatic aortic (valve) stenosis with insufficiency: Secondary | ICD-10-CM | POA: Diagnosis not present

## 2016-07-14 DIAGNOSIS — E876 Hypokalemia: Secondary | ICD-10-CM

## 2016-07-14 DIAGNOSIS — R627 Adult failure to thrive: Secondary | ICD-10-CM | POA: Diagnosis not present

## 2016-07-14 LAB — BASIC METABOLIC PANEL
BUN: 12 mg/dL (ref 4–21)
Creatinine: 0.5 mg/dL (ref <=1.1)
GLUCOSE: 89 mg/dL
Potassium: 3.5 mmol/L (ref 3.4–5.3)
Sodium: 143 mmol/L (ref 137–147)

## 2016-07-14 NOTE — Assessment & Plan Note (Signed)
Controlled, continue Losartan 50mg  qd, Furosemide 20mg  daily

## 2016-07-14 NOTE — Assessment & Plan Note (Signed)
Left lateral eyebrow area, healing nicely.

## 2016-07-14 NOTE — Assessment & Plan Note (Signed)
urine culture 06/02/16 E Coli >100,000c/ml, 7 day course of Septra DS started and tolerated.  urine culture 07/12/16 showed K. Pneumoniae, 07/13/16 Cipro 500mg  bid x 7 days started, tolerated.

## 2016-07-14 NOTE — Assessment & Plan Note (Signed)
Stable, off Protonix.  

## 2016-07-14 NOTE — Assessment & Plan Note (Signed)
07/06/16 X-ray left femur, hip, pelvis showed no fx, pain is in the left groin area, positional, managed with Tylenol tid and prn.

## 2016-07-14 NOTE — Assessment & Plan Note (Signed)
Continue comfort measures, Hospice Service, continue Alprazolam.

## 2016-07-14 NOTE — Progress Notes (Signed)
Location:  [facility] Room Number: 31 Place of Service:  SNF (31) Provider:  Mast, Manxie  NP  Kimber Relic, MD  Patient Care Team: Kimber Relic, MD as PCP - General (Internal Medicine) Clance, Maree Krabbe, MD as Consulting Physician (Pulmonary Disease) Gean Birchwood, MD as Consulting Physician (Orthopedic Surgery) Barnett Abu, MD as Consulting Physician (Neurosurgery)  Extended Emergency Contact Information Primary Emergency Contact: Sandi Mariscal Address: [address]          [place], [place] Phone: [phone] Relation: Daughter Secondary Emergency Contact: Royston Cow[place] Mobile Phone: [phone] Relation: Relative  Code Status:  DNR Goals of care: Advanced Directive information Advanced Directives 07/10/2016  Does Patient Have a Medical Advance Directive? Yes  Type of Advance Directive Out of facility DNR (pink MOST or yellow form)  Does patient want to make changes to medical advance directive? -  Copy of Healthcare Power of Attorney in Chart? -  Would patient like information on creating a medical advance directive? -  Pre-existing out of facility DNR order (yellow form or pink MOST form) -     Chief Complaint  Patient presents with  . Acute Visit    UTI    HPI:  Pt is a 83 y.o. female seen today for an acute visit for UTI, urine culture 07/12/16 showed K. Pneumoniae, 07/13/16 Cipro 500mg  bid x 7 days started, tolerated.    ED eval 07/04/16 for facial laceration(eft lateral eyebrow) sustained from fall [facility]. CT head, cervical spine showed Mild diffuse cortical atrophy. Mild chronic ischemic white matter disease. No acute intracranial abnormality seen.Extensive degenerative and postsurgical changes are noted in cervical spine. No acute abnormality is noted. 07/06/16  X-ray Left wrist, femur, hip, pelvis showed no acute fracture or dislocation. c/o left groin area pain,  taking Tylenol tid.     The nondisplaced fracture of the proximal fibular shaft. Denied the pain or discomfort, still able to bear weight and transfer self from w/c to bed. Denied Orth consultation                                      Hx of HTN, taking Losartan 50mg , Furosemide 20mg , COPD, maintained on Tessalon, Robitussin, DuoNed, Prednisone 5mg  qd, GERD stable, off Protonix 40mg  qd, back pain, taking Tylenol tid, Anemia 07/10/16 Hgb 10.5, taking Fe, depression/anxiety/insomnia, taking Alprazolam 0.5mg  tid and q6h prn    Past Medical History:  Diagnosis Date  . Alcohol abuse   . Anemia   . Anxiety   . Aortic stenosis    moderate on echo 09/2013  . Cervical spondylosis with myelopathy 01/2009   s/p decompression  . COPD (chronic obstructive pulmonary disease) (HCC)   . Dementia 09/06/2015  . Depression   . Fall at home 05/2015  . Heart murmur   . History of blood transfusion 01/02/2016   "anemia"  . Hypertension   . On home oxygen therapy    "2L; 24/7" (01/02/2016)  . Osteoporosis    (R) hip fx 11/2009 & (L) hip fx 2006  . Speech impediment    present for >9 months; no one has really been able to discern an etiology/daughter/notes 01/01/2016   Past Surgical History:  Procedure Laterality Date  . BACK SURGERY    . BLADDER SUSPENSION     "unsuccessful"  . CATARACT EXTRACTION W/ INTRAOCULAR LENS  IMPLANT, BILATERAL Bilateral 06/06/13 - 2016   "right - left"  . FRACTURE SURGERY    . HIP FRACTURE SURGERY Left 1980s  . LAPAROSCOPIC CHOLECYSTECTOMY  1995  . ORIF HIP FRACTURE Right 11/2009   Done by Dr. Turner Daniels  . POSTERIOR LAMINECTOMY / DECOMPRESSION CERVICAL SPINE  01/2009   Done by Dr. Danielle Dess  . WRIST FRACTURE SURGERY Bilateral    "one in [place]; one [place]"    Allergies  Allergen Reactions  . Ativan [Lorazepam] Other (See Comments)    Causes increased confusion/AMS    Outpatient Encounter Prescriptions as of 07/14/2016  Medication Sig  . acetaminophen (TYLENOL) 325 MG  tablet Take 650 mg by mouth 3 (three) times daily. Give at 8 am, 1 pm and 6 pm  . acetaminophen (TYLENOL) 500 MG tablet Take 1,000 mg by mouth 3 (three) times daily as needed. Take 2 tablets as needed up to 3 times daily  . ALPRAZolam (XANAX) 0.5 MG tablet Take 0.5 mg by mouth every 6 (six) hours as needed.   . benzonatate (TESSALON) 100 MG capsule Take 1 capsule by mouth 3 (three) times daily. Give at 8 am, 2 pm and 8 pm  . furosemide (LASIX) 20 MG tablet Take 20 mg by mouth daily.  Marland Kitchen guaifenesin (ROBITUSSIN) 100 MG/5ML syrup Take 200 mg by mouth. Take 10 ml every 4 hours as needed for cough  . ipratropium-albuterol (DUONEB) 0.5-2.5 (3) MG/3ML SOLN Take 3 mLs by nebulization. Use nebulizer four times daily  . loperamide (IMODIUM) 2 MG capsule 1 tab po bid prn diarrhea  . Melatonin 5 MG TABS Take 5 mg by mouth at bedtime.   . OXYGEN Inhale 2 L/min into the lungs continuous.  . potassium chloride (K-DUR) 10 MEQ tablet Take 10 mEq by mouth daily.  . predniSONE (DELTASONE) 5 MG tablet Take 5 mg by mouth daily with breakfast. Take one tablet once a day    No facility-administered encounter medications on file as of 07/14/2016.     Review of Systems  Constitutional: Negative for activity change, appetite change, chills, diaphoresis, fatigue, fever and unexpected weight change.  HENT: Negative for congestion, ear discharge, ear pain, hearing loss, postnasal drip, rhinorrhea, sinus pressure, sneezing, sore throat, tinnitus, trouble swallowing and voice change.   Eyes: Negative for pain, discharge, redness, itching and visual disturbance.  Respiratory: Positive for cough and shortness of breath. Negative for choking, chest tightness and wheezing.        COPD with O2 dependency  Cardiovascular: Positive for leg swelling. Negative for chest pain and palpitations.  Gastrointestinal: Negative.  Negative for abdominal distention, abdominal pain, constipation, diarrhea and nausea.  Endocrine: Negative for  cold intolerance, heat intolerance, polydipsia, polyphagia and polyuria.  Genitourinary: Negative.  Negative for difficulty urinating, dysuria, flank pain, frequency, hematuria, pelvic pain, urgency and vaginal discharge.  Musculoskeletal: Positive for arthralgias and gait problem. Negative for back pain, myalgias, neck pain and neck stiffness.       Wears Hard collar, pain in left groin area, no reduced ROM hip, knee, ankle.   Skin: Negative for color change, pallor and rash.       Sustained a facial laceration from fall [facility]  Allergic/Immunologic: Negative.   Neurological: Positive for weakness. Negative for dizziness, tremors, seizures, syncope, numbness and headaches.       Cognitive changes. Speech impediment of unknown etiology.  Hematological: Negative for adenopathy. Does not bruise/bleed easily.       Anemic in Jan 2018 with Hgb  5.9.thought due to UGI bleed. Endoscopy was not done. She was transfused 2 units PRBC. Hgb now normal.   Psychiatric/Behavioral: Negative.  Negative for agitation, behavioral problems, confusion, dysphoric mood, hallucinations, sleep disturbance and suicidal ideas. The patient is not nervous/anxious and is not hyperactive.     Immunization History  Administered Date(s) Administered  . Influenza Split 11/03/2011, 11/17/2013, 11/25/2014  . Influenza-Unspecified 12/12/2015, 12/19/2015  . PPD Test 06/06/2015, 06/20/2015  . Pneumococcal Polysaccharide-23 09/17/2013  . Tdap 07/04/2016   Pertinent  Health Maintenance Due  Topic Date Due  . PNA vac Low Risk Adult (2 of 2 - PCV13) 09/18/2014  . INFLUENZA VACCINE  09/24/2016  . DEXA SCAN  Completed   Fall Risk  04/11/2015  Falls in the past year? No   Functional Status Survey:    Vitals:   07/14/16 1531  BP: 126/66  Pulse: 80  Resp: 18  Temp: 98.7 F (37.1 C)  SpO2: 98%  Weight: 139 lb 4.8 oz (63.2 kg)  Height: 5\' 3"  (1.6 m)   Body mass index is 24.68 kg/m. Physical Exam  Constitutional: No  distress.  Frail elderly   HENT:  Head: Normocephalic.  Right Ear: External ear normal.  Left Ear: External ear normal.  Nose: Nose normal.  Mouth/Throat: Oropharynx is clear and moist. No oropharyngeal exudate.  Eyes: Conjunctivae and EOM are normal. Pupils are equal, round, and reactive to light. Right eye exhibits no discharge. Left eye exhibits no discharge. No scleral icterus.  Neck: No JVD present. No tracheal deviation present. No thyromegaly present.  Hard collar in place.  Cardiovascular: Normal rate, regular rhythm and intact distal pulses.  Exam reveals no gallop and no friction rub.   Murmur heard. Systolic murmur 3-4/6  Pulmonary/Chest: Effort normal and breath sounds normal. No respiratory distress. She has no wheezes. She has no rales. She exhibits no tenderness.  Diffused decreased breath sounds, O2 dependent.   Abdominal: Soft. Bowel sounds are normal. She exhibits no distension and no mass. There is no tenderness. There is no rebound and no guarding.  Musculoskeletal: Normal range of motion. She exhibits edema. She exhibits no tenderness.  Muscle weakness to lower extremities. Trace edema RLL  Lymphadenopathy:    She has no cervical adenopathy.  Neurological: She is alert. No cranial nerve deficit. Coordination normal.  Speech impaired: hesitant, gutteral, difficult too understand.   Skin: Skin is warm and dry. No rash noted. She is not diaphoretic. No erythema. No pallor.  Left lateral eyebrow laceration, wound closure is intact.   Psychiatric: She has a normal mood and affect. Her behavior is normal.  Expresses several times that she is angry about her rent move and that she feels like it is motivated by money to the institution.    Labs reviewed:  Recent Labs  03/07/16 1611 03/07/16 2247 03/08/16 0512 05/29/16 07/08/16  NA 142 139 139 138 142  K 4.8 4.1 3.8 3.3* 3.1*  CL 108 105 106  --   --   CO2 28 24 26   --   --   GLUCOSE 113* 109* 101*  --   --   BUN  22 20 16 13 16   CREATININE 0.63 0.70 0.67 0.6 0.5  CALCIUM 8.8 9.0 8.6*  --   --     Recent Labs  01/01/16 1446 03/07/16 2247 03/08/16 0512 05/29/16 07/08/16  AST 16 38 39 21 25  ALT 11 61* 57* 21 24  ALKPHOS 88 127* 119 84 89  BILITOT 0.2  0.7 0.5  --   --   PROT 7.0 6.5 5.9*  --   --   ALBUMIN 4.0 3.1* 2.7*  --   --     Recent Labs  03/07/16 1611  03/09/16 0602 03/28/16 1715 04/11/16 1506  06/12/16 07/08/16 07/10/16  WBC 8.8  < > 7.5 6.5 5.9  < > 6.0 6.2 6.2  NEUTROABS 7.2  --   --  4.1 4.0  --   --   --   --   HGB 5.9 Repeated and verified X2.*  < > 8.1* 12.0 12.4  < > 10.5* 10.4* 10.5*  HCT 18.6 Repeated and verified X2.*  < > 25.6* 38.0 38.2  < > 32* 33* 31*  MCV 82.6  < > 85.0 85.1 85.1  --   --   --   --   PLT 343.0  < > 249 191.0 153.0  < > 171 161 181  < > = values in this interval not displayed. Lab Results  Component Value Date   TSH 2.01 01/01/2016   No results found for: HGBA1C Lab Results  Component Value Date   CHOL 260 (H) 04/11/2015   HDL 92.50 04/11/2015   LDLCALC 148 (H) 04/11/2015   LDLDIRECT 189.2 12/12/2008   TRIG 98.0 04/11/2015   CHOLHDL 3 04/11/2015    Significant Diagnostic Results in last 30 days:  Ct Head Wo Contrast  Result Date: 07/04/2016 CLINICAL DATA:  Head laceration after fall at nursing facility. No loss of consciousness. EXAM: CT HEAD WITHOUT CONTRAST CT CERVICAL SPINE WITHOUT CONTRAST TECHNIQUE: Multidetector CT imaging of the head and cervical spine was performed following the standard protocol without intravenous contrast. Multiplanar CT image reconstructions of the cervical spine were also generated. COMPARISON:  CT scans of June 01, 2015 and June 02, 2015. FINDINGS: CT HEAD FINDINGS Brain: Mild diffuse cortical atrophy is noted. Mild chronic ischemic white matter disease is noted. No mass effect or midline shift is noted. Ventricular size is within normal limits. There is no evidence of mass lesion, hemorrhage or acute  infarction. Vascular: Atherosclerosis of carotid siphons is noted. Skull: Normal. Negative for fracture or focal lesion. Sinuses/Orbits: No acute finding. Other: None. CT CERVICAL SPINE FINDINGS Alignment: Minimal grade 1 anterolisthesis of C3-4 is noted secondary to posterior facet joint hypertrophy. Status post surgical anterior fusion extending from C4-C7. Minimal grade 1 anterolisthesis of C7-T1 is noted secondary to posterior facet joint hypertrophy. Skull base and vertebrae: No acute fracture. No primary bone lesion or focal pathologic process. Soft tissues and spinal canal: No prevertebral fluid or swelling. No visible canal hematoma. Disc levels: Mild degenerative disc disease is noted at C3-4 and C7-T1. Status post surgical fusion of C4-5, C5-6 and C6-7. Upper chest: Negative. Other: Degenerative changes seen involving posterior facet joints. IMPRESSION: Mild diffuse cortical atrophy. Mild chronic ischemic white matter disease. No acute intracranial abnormality seen. Extensive degenerative and postsurgical changes are noted in cervical spine. No acute abnormality is noted. Electronically Signed   By: Lupita Raider, M.D.   On: 07/04/2016 16:31   Ct Cervical Spine Wo Contrast  Result Date: 07/04/2016 CLINICAL DATA:  Head laceration after fall at nursing facility. No loss of consciousness. EXAM: CT HEAD WITHOUT CONTRAST CT CERVICAL SPINE WITHOUT CONTRAST TECHNIQUE: Multidetector CT imaging of the head and cervical spine was performed following the standard protocol without intravenous contrast. Multiplanar CT image reconstructions of the cervical spine were also generated. COMPARISON:  CT scans of June 01, 2015 and  June 02, 2015. FINDINGS: CT HEAD FINDINGS Brain: Mild diffuse cortical atrophy is noted. Mild chronic ischemic white matter disease is noted. No mass effect or midline shift is noted. Ventricular size is within normal limits. There is no evidence of mass lesion, hemorrhage or acute infarction.  Vascular: Atherosclerosis of carotid siphons is noted. Skull: Normal. Negative for fracture or focal lesion. Sinuses/Orbits: No acute finding. Other: None. CT CERVICAL SPINE FINDINGS Alignment: Minimal grade 1 anterolisthesis of C3-4 is noted secondary to posterior facet joint hypertrophy. Status post surgical anterior fusion extending from C4-C7. Minimal grade 1 anterolisthesis of C7-T1 is noted secondary to posterior facet joint hypertrophy. Skull base and vertebrae: No acute fracture. No primary bone lesion or focal pathologic process. Soft tissues and spinal canal: No prevertebral fluid or swelling. No visible canal hematoma. Disc levels: Mild degenerative disc disease is noted at C3-4 and C7-T1. Status post surgical fusion of C4-5, C5-6 and C6-7. Upper chest: Negative. Other: Degenerative changes seen involving posterior facet joints. IMPRESSION: Mild diffuse cortical atrophy. Mild chronic ischemic white matter disease. No acute intracranial abnormality seen. Extensive degenerative and postsurgical changes are noted in cervical spine. No acute abnormality is noted. Electronically Signed   By: Lupita RaiderJames  Green Jr, M.D.   On: 07/04/2016 16:31    Assessment/Plan UTI (urinary tract infection) urine culture 06/02/16 E Coli >100,000c/ml, 7 day course of Septra DS started and tolerated.  urine culture 07/12/16 showed K. Pneumoniae, 07/13/16 Cipro 500mg  bid x 7 days started, tolerated.   Essential hypertension Controlled, continue Losartan 50mg  qd, Furosemide 20mg  daily  COPD GOLD II  Maintained on Tessalon tid prn, Robitussin q4h prn, DuoNeb qid, Prednisone 5mg  qd  Constipation Stable. Off laxatives.   GERD (gastroesophageal reflux disease) Stable, off Protonix.   Depression managed with Alprazolam 0.5mg  tid and q6h prn, effective  Failure to thrive in adult Continue comfort measures, Hospice Service, continue Alprazolam.   Facial laceration Left lateral eyebrow area, healing nicely.   Left groin  pain 07/06/16 X-ray left femur, hip, pelvis showed no fx, pain is in the left groin area, positional, managed with Tylenol tid and prn.   Hypokalemia Pending BMP, Kcl started 07/09/16, last K 3.1 07/08/16.      Family/ staff Communication: SNF  Labs/tests ordered:  none

## 2016-07-14 NOTE — Assessment & Plan Note (Signed)
Maintained on Tessalon tid prn, Robitussin q4h prn, DuoNeb qid, Prednisone 5mg  qd

## 2016-07-14 NOTE — Assessment & Plan Note (Signed)
Stable. Off laxatives.

## 2016-07-14 NOTE — Assessment & Plan Note (Signed)
managed with Alprazolam 0.5mg tid and q6h prn, effective 

## 2016-07-14 NOTE — Assessment & Plan Note (Signed)
Pending BMP, Kcl started 07/09/16, last K 3.1 07/08/16.

## 2016-07-17 ENCOUNTER — Other Ambulatory Visit: Payer: Self-pay | Admitting: *Deleted

## 2016-07-29 DIAGNOSIS — D649 Anemia, unspecified: Secondary | ICD-10-CM | POA: Diagnosis not present

## 2016-07-29 LAB — CBC AND DIFFERENTIAL
HEMATOCRIT: 30 % — AB (ref 36–46)
HEMOGLOBIN: 9.8 g/dL — AB (ref 12.0–16.0)
PLATELETS: 200 10*3/uL (ref 150–399)
WBC: 5.4 10^3/mL

## 2016-07-30 ENCOUNTER — Other Ambulatory Visit: Payer: Self-pay | Admitting: *Deleted

## 2016-08-14 DIAGNOSIS — D500 Iron deficiency anemia secondary to blood loss (chronic): Secondary | ICD-10-CM | POA: Diagnosis not present

## 2016-08-14 LAB — CBC AND DIFFERENTIAL
HCT: 31 — AB (ref 36–46)
Hemoglobin: 9.8 — AB (ref 12.0–16.0)
Platelets: 186 (ref 150–399)
WBC: 6

## 2016-08-15 ENCOUNTER — Other Ambulatory Visit: Payer: Self-pay | Admitting: *Deleted

## 2016-08-18 ENCOUNTER — Encounter: Payer: Self-pay | Admitting: Nurse Practitioner

## 2016-08-18 ENCOUNTER — Non-Acute Institutional Stay (SKILLED_NURSING_FACILITY): Payer: Medicare Other | Admitting: Nurse Practitioner

## 2016-08-18 DIAGNOSIS — I10 Essential (primary) hypertension: Secondary | ICD-10-CM

## 2016-08-18 DIAGNOSIS — J449 Chronic obstructive pulmonary disease, unspecified: Secondary | ICD-10-CM | POA: Diagnosis not present

## 2016-08-18 DIAGNOSIS — F329 Major depressive disorder, single episode, unspecified: Secondary | ICD-10-CM | POA: Diagnosis not present

## 2016-08-18 DIAGNOSIS — D500 Iron deficiency anemia secondary to blood loss (chronic): Secondary | ICD-10-CM | POA: Diagnosis not present

## 2016-08-18 DIAGNOSIS — F32A Depression, unspecified: Secondary | ICD-10-CM

## 2016-08-18 DIAGNOSIS — N390 Urinary tract infection, site not specified: Secondary | ICD-10-CM

## 2016-08-18 NOTE — Assessment & Plan Note (Signed)
Controlled, continue Losartan 50mg  qd, Furosemide 20mg  daily

## 2016-08-18 NOTE — Assessment & Plan Note (Signed)
Maintained on Tessalon tid prn, Robitussin q4h prn, DuoNeb qid, Prednisone 5mg  qd

## 2016-08-18 NOTE — Progress Notes (Signed)
Location:  [facility] Room Number: 31 Place of Service:  SNF (31) Provider:  Marin Wisner, Manxie  NP  Kimber Relic, MD  Patient Care Team: Kimber Relic, MD as PCP - General (Internal Medicine) Clance, Maree Krabbe, MD as Consulting Physician (Pulmonary Disease) Gean Birchwood, MD as Consulting Physician (Orthopedic Surgery) Barnett Abu, MD as Consulting Physician (Neurosurgery)  Extended Emergency Contact Information Primary Emergency Contact: Sandi Mariscal Address: [address]          [place], [place] Phone: [phone] Relation: Daughter Secondary Emergency Contact: Royston Cowper [place] Mobile Phone: [phone] Relation: Relative  Code Status:  DNR Goals of care: Advanced Directive information Advanced Directives 08/18/2016  Does Patient Have a Medical Advance Directive? Yes  Type of Advance Directive Out of facility DNR (pink MOST or yellow form)  Does patient want to make changes to medical advance directive? No - Patient declined  Copy of Healthcare Power of Attorney in Chart? Yes  Would patient like information on creating a medical advance directive? -  Pre-existing out of facility DNR order (yellow form or pink MOST form) Yellow form placed in chart (order not valid for inpatient use)     Chief Complaint  Patient presents with  . Medical Management of Chronic Issues    UTI f/u    HPI:  Pt is a 83 y.o. female seen today anemia, resumed Fe, pending anemia penal 08/19/16.     The nondisplaced fracture of the proximal fibular shaft. Denied the pain or discomfort, still able to bear weight and transfer self from w/c to bed. Declined Orth consultation, only desire Tylenol prn.   Hx of HTN, taking Losartan 50mg , Furosemide 20mg , COPD, maintained on Tessalon, Robitussin, DuoNed, Prednisone 5mg  qd, GERD stable, off Protonix 40mg  qd, back pain, taking Tylenol tid,  depression/anxiety/insomnia, taking Alprazolam 0.5mg  tid and q6h prn  Past Medical History:  Diagnosis Date  . Alcohol abuse   . Anemia   . Anxiety   . Aortic stenosis    moderate on echo 09/2013  . Cervical spondylosis with myelopathy 01/2009   s/p decompression  . COPD (chronic obstructive pulmonary disease) (HCC)   . Dementia 09/06/2015  . Depression   . Fall at home 05/2015  . Heart murmur   . History of blood transfusion 01/02/2016   "anemia"  . Hypertension   . On home oxygen therapy    "2L; 24/7" (01/02/2016)  . Osteoporosis    (R) hip fx 11/2009 & (L) hip fx 2006  . Speech impediment    present for >9 months; no one has really been able to discern an etiology/daughter/notes 01/01/2016   Past Surgical History:  Procedure Laterality Date  . BACK SURGERY    . BLADDER SUSPENSION     "unsuccessful"  . CATARACT EXTRACTION W/ INTRAOCULAR LENS  IMPLANT, BILATERAL Bilateral 06/06/13 - 2016   "right - left"  . FRACTURE SURGERY    . HIP FRACTURE SURGERY Left 1980s  . LAPAROSCOPIC CHOLECYSTECTOMY  1995  . ORIF HIP FRACTURE Right 11/2009   Done by Dr. Turner Daniels  . POSTERIOR LAMINECTOMY / DECOMPRESSION CERVICAL SPINE  01/2009   Done by Dr. Danielle Dess  . WRIST FRACTURE SURGERY Bilateral    "one in [place]; one [place]"    Allergies  Allergen Reactions  . Ativan [Lorazepam] Other (See Comments)    Causes increased confusion/AMS    Outpatient Encounter Prescriptions as of 08/18/2016  Medication Sig  .  acetaminophen (TYLENOL) 325 MG tablet Take 650 mg by mouth 3 (three) times daily. Give at 8 am, 1 pm and 6 pm  . acetaminophen (TYLENOL) 500 MG tablet Take 1,000 mg by mouth 3 (three) times daily as needed. Take 2 tablets as needed up to 3 times daily  . ALPRAZolam (XANAX) 0.5 MG tablet Take 0.5 mg by mouth every 6 (six) hours as needed.   . benzonatate (TESSALON) 100 MG capsule Take 1 capsule by mouth 3 (three) times daily. Give at 8 am, 2 pm and 8 pm  . furosemide (LASIX) 20 MG tablet  Take 20 mg by mouth daily.  Marland Kitchen. guaifenesin (ROBITUSSIN) 100 MG/5ML syrup Take 200 mg by mouth. Take 10 ml every 4 hours as needed for cough  . ipratropium-albuterol (DUONEB) 0.5-2.5 (3) MG/3ML SOLN Take 3 mLs by nebulization. Use nebulizer four times daily  . loperamide (IMODIUM) 2 MG capsule 1 tab po bid prn diarrhea  . Melatonin 5 MG TABS Take 5 mg by mouth at bedtime.   . OXYGEN Inhale 2 L/min into the lungs continuous.  . potassium chloride (K-DUR) 10 MEQ tablet Take 10 mEq by mouth daily.  . predniSONE (DELTASONE) 5 MG tablet Take 5 mg by mouth daily with breakfast. Take one tablet once a day    No facility-administered encounter medications on file as of 08/18/2016.     Review of Systems  Constitutional: Negative for activity change, appetite change, chills, diaphoresis, fatigue, fever and unexpected weight change.  HENT: Negative for congestion, ear discharge, ear pain, hearing loss, postnasal drip, rhinorrhea, sinus pressure, sneezing, sore throat, tinnitus, trouble swallowing and voice change.   Eyes: Negative for pain, discharge, redness, itching and visual disturbance.  Respiratory: Positive for cough and shortness of breath. Negative for choking, chest tightness and wheezing.        COPD with O2 dependency  Cardiovascular: Positive for leg swelling. Negative for chest pain and palpitations.  Gastrointestinal: Negative.  Negative for abdominal distention, abdominal pain, constipation, diarrhea and nausea.  Endocrine: Negative for cold intolerance, heat intolerance, polydipsia, polyphagia and polyuria.  Genitourinary: Negative.  Negative for difficulty urinating, dysuria, flank pain, frequency, hematuria, pelvic pain, urgency and vaginal discharge.  Musculoskeletal: Positive for arthralgias and gait problem. Negative for back pain, myalgias, neck pain and neck stiffness.       Wears Hard collar, pain in left groin area, no reduced ROM hip, knee, ankle.   Skin: Negative for color  change, pallor and rash.  Allergic/Immunologic: Negative.   Neurological: Positive for weakness. Negative for dizziness, tremors, seizures, syncope, numbness and headaches.       Cognitive changes. Speech impediment of unknown etiology.  Hematological: Negative for adenopathy. Does not bruise/bleed easily.       Anemic in Jan 2018 with Hgb 5.9.thought due to UGI bleed. Endoscopy was not done. She was transfused 2 units PRBC. Hgb now normal.   Psychiatric/Behavioral: Negative.  Negative for agitation, behavioral problems, confusion, dysphoric mood, hallucinations, sleep disturbance and suicidal ideas. The patient is not nervous/anxious and is not hyperactive.     Immunization History  Administered Date(s) Administered  . Influenza Split 11/03/2011, 11/17/2013, 11/25/2014  . Influenza-Unspecified 12/12/2015, 12/19/2015  . PPD Test 06/06/2015, 06/20/2015  . Pneumococcal Polysaccharide-23 09/17/2013  . Tdap 07/04/2016   Pertinent  Health Maintenance Due  Topic Date Due  . PNA vac Low Risk Adult (2 of 2 - PCV13) 09/18/2014  . INFLUENZA VACCINE  09/24/2016  . DEXA SCAN  Completed   Fall  Risk  04/11/2015  Falls in the past year? No   Functional Status Survey:    Vitals:   08/18/16 1340  BP: 130/60  Pulse: 62  Resp: 18  Temp: 97.5 F (36.4 C)  SpO2: 94%  Weight: 130 lb 9.6 oz (59.2 kg)  Height: 5\' 3"  (1.6 m)   Body mass index is 23.13 kg/m. Physical Exam  Constitutional: No distress.  Frail elderly   HENT:  Head: Normocephalic.  Right Ear: External ear normal.  Left Ear: External ear normal.  Nose: Nose normal.  Mouth/Throat: Oropharynx is clear and moist. No oropharyngeal exudate.  Eyes: Conjunctivae and EOM are normal. Pupils are equal, round, and reactive to light. Right eye exhibits no discharge. Left eye exhibits no discharge. No scleral icterus.  Neck: No JVD present. No tracheal deviation present. No thyromegaly present.  Hard collar in place.  Cardiovascular:  Normal rate, regular rhythm and intact distal pulses.  Exam reveals no gallop and no friction rub.   Murmur heard. Systolic murmur 3-4/6  Pulmonary/Chest: Effort normal and breath sounds normal. No respiratory distress. She has no wheezes. She has no rales. She exhibits no tenderness.  Diffused decreased breath sounds, O2 dependent.   Abdominal: Soft. Bowel sounds are normal. She exhibits no distension and no mass. There is no tenderness. There is no rebound and no guarding.  Musculoskeletal: Normal range of motion. She exhibits edema. She exhibits no tenderness.  Muscle weakness to lower extremities. Trace edema RLL  Lymphadenopathy:    She has no cervical adenopathy.  Neurological: She is alert. No cranial nerve deficit. Coordination normal.  Speech impaired: hesitant, gutteral, difficult too understand.   Skin: Skin is warm and dry. No rash noted. She is not diaphoretic. No erythema. No pallor.  Psychiatric: She has a normal mood and affect. Her behavior is normal.  Expresses several times that she is angry about her rent move and that she feels like it is motivated by money to the institution.    Labs reviewed:  Recent Labs  03/07/16 1611 03/07/16 2247 03/08/16 0512 05/29/16 07/08/16 07/14/16  NA 142 139 139 138 142 143  K 4.8 4.1 3.8 3.3* 3.1* 3.5  CL 108 105 106  --   --   --   CO2 28 24 26   --   --   --   GLUCOSE 113* 109* 101*  --   --   --   BUN 22 20 16 13 16 12   CREATININE 0.63 0.70 0.67 0.6 0.5 0.5  CALCIUM 8.8 9.0 8.6*  --   --   --     Recent Labs  01/01/16 1446 03/07/16 2247 03/08/16 0512 05/29/16 07/08/16  AST 16 38 39 21 25  ALT 11 61* 57* 21 24  ALKPHOS 88 127* 119 84 89  BILITOT 0.2 0.7 0.5  --   --   PROT 7.0 6.5 5.9*  --   --   ALBUMIN 4.0 3.1* 2.7*  --   --     Recent Labs  03/07/16 1611  03/09/16 0602 03/28/16 1715 04/11/16 1506  07/10/16 07/29/16 08/14/16  WBC 8.8  < > 7.5 6.5 5.9  < > 6.2 5.4 6.0  NEUTROABS 7.2  --   --  4.1 4.0  --   --    --   --   HGB 5.9 Repeated and verified X2.*  < > 8.1* 12.0 12.4  < > 10.5* 9.8* 9.8*  HCT 18.6 Repeated and verified X2.*  < >  25.6* 38.0 38.2  < > 31* 30* 31*  MCV 82.6  < > 85.0 85.1 85.1  --   --   --   --   PLT 343.0  < > 249 191.0 153.0  < > 181 200 186  < > = values in this interval not displayed. Lab Results  Component Value Date   TSH 2.01 01/01/2016   No results found for: HGBA1C Lab Results  Component Value Date   CHOL 260 (H) 04/11/2015   HDL 92.50 04/11/2015   LDLCALC 148 (H) 04/11/2015   LDLDIRECT 189.2 12/12/2008   TRIG 98.0 04/11/2015   CHOLHDL 3 04/11/2015    Significant Diagnostic Results in last 30 days:  No results found.  Assessment/Plan: Anemia 07/29/16 wbc 5.4, Hgb 9.8, plt 200 08/14/16 wbc 6.0, Hgb 9.8, plt 186, obtain anemia panel.   Essential hypertension Controlled, continue Losartan 50mg  qd, Furosemide 20mg  daily  COPD GOLD II  Maintained on Tessalon tid prn, Robitussin q4h prn, DuoNeb qid, Prednisone 5mg  qd  Depression managed with Alprazolam 0.5mg  tid and q6h prn, effective      Family/ staff Communication: SNF  Labs/tests ordered:  Fe, Sat Fe, TIBC, Ferritin, Vit B12, Folate, retic count

## 2016-08-18 NOTE — Assessment & Plan Note (Signed)
managed with Alprazolam 0.5mg  tid and q6h prn, effective

## 2016-08-18 NOTE — Assessment & Plan Note (Signed)
07/29/16 wbc 5.4, Hgb 9.8, plt 200 08/14/16 wbc 6.0, Hgb 9.8, plt 186, obtain anemia panel.

## 2016-08-19 DIAGNOSIS — I5020 Unspecified systolic (congestive) heart failure: Secondary | ICD-10-CM | POA: Diagnosis not present

## 2016-08-19 DIAGNOSIS — E639 Nutritional deficiency, unspecified: Secondary | ICD-10-CM | POA: Diagnosis not present

## 2016-08-19 DIAGNOSIS — D510 Vitamin B12 deficiency anemia due to intrinsic factor deficiency: Secondary | ICD-10-CM | POA: Diagnosis not present

## 2016-08-19 DIAGNOSIS — D649 Anemia, unspecified: Secondary | ICD-10-CM | POA: Diagnosis not present

## 2016-09-02 ENCOUNTER — Other Ambulatory Visit: Payer: Self-pay | Admitting: *Deleted

## 2016-09-02 DIAGNOSIS — D649 Anemia, unspecified: Secondary | ICD-10-CM | POA: Diagnosis not present

## 2016-09-02 LAB — CBC AND DIFFERENTIAL
HCT: 10 — AB (ref 36–46)
HEMOGLOBIN: 3.7 — AB (ref 12.0–16.0)
Platelets: 187 (ref 150–399)
WBC: 6.3

## 2016-09-12 ENCOUNTER — Encounter: Payer: Self-pay | Admitting: Nurse Practitioner

## 2016-09-12 DIAGNOSIS — R1310 Dysphagia, unspecified: Secondary | ICD-10-CM | POA: Insufficient documentation

## 2016-09-16 ENCOUNTER — Encounter: Payer: Self-pay | Admitting: Internal Medicine

## 2016-09-16 ENCOUNTER — Non-Acute Institutional Stay (SKILLED_NURSING_FACILITY): Payer: Medicare Other | Admitting: Internal Medicine

## 2016-09-16 DIAGNOSIS — J449 Chronic obstructive pulmonary disease, unspecified: Secondary | ICD-10-CM

## 2016-09-16 DIAGNOSIS — S82831S Other fracture of upper and lower end of right fibula, sequela: Secondary | ICD-10-CM

## 2016-09-16 DIAGNOSIS — F411 Generalized anxiety disorder: Secondary | ICD-10-CM | POA: Diagnosis not present

## 2016-09-16 DIAGNOSIS — I350 Nonrheumatic aortic (valve) stenosis: Secondary | ICD-10-CM

## 2016-09-16 DIAGNOSIS — R296 Repeated falls: Secondary | ICD-10-CM | POA: Diagnosis not present

## 2016-09-16 DIAGNOSIS — J9611 Chronic respiratory failure with hypoxia: Secondary | ICD-10-CM | POA: Diagnosis not present

## 2016-09-16 DIAGNOSIS — D638 Anemia in other chronic diseases classified elsewhere: Secondary | ICD-10-CM | POA: Diagnosis not present

## 2016-09-16 DIAGNOSIS — K529 Noninfective gastroenteritis and colitis, unspecified: Secondary | ICD-10-CM

## 2016-09-16 NOTE — Progress Notes (Signed)
Location:  [facility] Room Number: 31 Place of Service:  SNF [phone]) Provider:  Oneal Grout MD  Oneal Grout, MD  Patient Care Team: Oneal Grout, MD as PCP - General (Internal Medicine) Clance, Maree Krabbe, MD as Consulting Physician (Pulmonary Disease) Gean Birchwood, MD as Consulting Physician (Orthopedic Surgery) Barnett Abu, MD as Consulting Physician (Neurosurgery) Mast, Man X, NP as Nurse Practitioner (Internal Medicine)  Extended Emergency Contact Information Primary Emergency Contact: Sandi Mariscal Address: [address]          [place], [place] Phone: [phone] Relation: Daughter Secondary Emergency Contact: Royston Cowper [place] Mobile Phone: [phone] Relation: Relative  Code Status:  DNR  Goals of care: Advanced Directive information Advanced Directives 08/18/2016  Does Patient Have a Medical Advance Directive? Yes  Type of Advance Directive Out of facility DNR (pink MOST or yellow form)  Does patient want to make changes to medical advance directive? No - Patient declined  Copy of Healthcare Power of Attorney in Chart? Yes  Would patient like information on creating a medical advance directive? -  Pre-existing out of facility DNR order (yellow form or pink MOST form) Yellow form placed in chart (order not valid for inpatient use)     Chief Complaint  Patient presents with  . Medical Management of Chronic Issues    Routine Visit     HPI:  Patient is a 83 y.o. female seen today for medical management of chronic diseases.  She is under hospice service. She denies any concern. She is on oxygen by nasal canula and her breathing appears stable. She has dysarthria and this limits her HPI and ROS some. She feeds herself. She needs 1 person assistance with transfer. She had a fall on 09/10/16 during unassisted transfer with no apparent injury.    Past Medical History:    Diagnosis Date  . Alcohol abuse   . Anemia   . Anxiety   . Aortic stenosis    moderate on echo 09/2013  . Cervical spondylosis with myelopathy 01/2009   s/p decompression  . COPD (chronic obstructive pulmonary disease) (HCC)   . Dementia 09/06/2015  . Depression   . Fall at home 05/2015  . Heart murmur   . History of blood transfusion 01/02/2016   "anemia"  . Hypertension   . On home oxygen therapy    "2L; 24/7" (01/02/2016)  . Osteoporosis    (R) hip fx 11/2009 & (L) hip fx 2006  . Speech impediment    present for >9 months; no one has really been able to discern an etiology/daughter/notes 01/01/2016   Past Surgical History:  Procedure Laterality Date  . BACK SURGERY    . BLADDER SUSPENSION     "unsuccessful"  . CATARACT EXTRACTION W/ INTRAOCULAR LENS  IMPLANT, BILATERAL Bilateral 06/06/13 - 2016   "right - left"  . FRACTURE SURGERY    . HIP FRACTURE SURGERY Left 1980s  . LAPAROSCOPIC CHOLECYSTECTOMY  1995  . ORIF HIP FRACTURE Right 11/2009   Done by Dr. Turner Daniels  . POSTERIOR LAMINECTOMY / DECOMPRESSION CERVICAL SPINE  01/2009   Done by Dr. Danielle Dess  . WRIST FRACTURE SURGERY Bilateral    "one in [place]; one [place]"    Allergies  Allergen Reactions  . Ativan [Lorazepam] Other (See Comments)    Causes increased confusion/AMS    Outpatient Encounter Prescriptions as of 09/16/2016  Medication Sig  . acetaminophen (TYLENOL) 500 MG tablet Take 1,000  mg by mouth 3 (three) times daily as needed.   . ALPRAZolam (XANAX) 0.5 MG tablet Take 0.5 mg by mouth 4 (four) times daily.   . benzonatate (TESSALON) 100 MG capsule Take 1 capsule by mouth 3 (three) times daily. Give at 8 am, 2 pm and 8 pm  . furosemide (LASIX) 20 MG tablet Take 20 mg by mouth daily.  Marland Kitchen guaifenesin (ROBITUSSIN) 100 MG/5ML syrup Take by mouth. Take 10 ml every 4 hours as needed for cough  . ipratropium-albuterol (DUONEB) 0.5-2.5 (3) MG/3ML SOLN Take 3 mLs by nebulization. Use nebulizer four times daily  .  loperamide (IMODIUM) 2 MG capsule 1 tab po bid prn diarrhea  . Melatonin 5 MG TABS Take 5 mg by mouth at bedtime.   . OXYGEN Inhale 2 L/min into the lungs continuous.  . potassium chloride (K-DUR) 10 MEQ tablet Take 20 mEq by mouth daily.  . predniSONE (DELTASONE) 5 MG tablet Take 5 mg by mouth daily with breakfast. Take one tablet once a day   . [DISCONTINUED] acetaminophen (TYLENOL) 325 MG tablet Take 650 mg by mouth 3 (three) times daily. Give at 8 am, 1 pm and 6 pm  . [DISCONTINUED] potassium chloride (K-DUR) 10 MEQ tablet Take 10 mEq by mouth daily.   No facility-administered encounter medications on file as of 09/16/2016.     Review of Systems  Constitutional: Negative for chills and fever.  HENT: Negative for congestion, mouth sores, sinus pressure, sore throat and trouble swallowing.   Respiratory: Positive for shortness of breath. Negative for cough and wheezing.   Cardiovascular: Negative for chest pain and palpitations.  Gastrointestinal: Positive for diarrhea. Negative for abdominal pain, constipation, nausea and vomiting.  Genitourinary: Negative for dysuria.  Musculoskeletal: Positive for gait problem.       On wheelchair, needs assistance with transfer  Skin: Negative for wound.  Neurological: Positive for weakness. Negative for dizziness.  Psychiatric/Behavioral: Negative for behavioral problems.    Immunization History  Administered Date(s) Administered  . Influenza Split 11/03/2011, 11/17/2013, 11/25/2014  . Influenza-Unspecified 12/12/2015, 12/19/2015  . PPD Test 06/06/2015, 06/20/2015  . Pneumococcal Polysaccharide-23 09/17/2013  . Tdap 07/04/2016   Pertinent  Health Maintenance Due  Topic Date Due  . PNA vac Low Risk Adult (2 of 2 - PCV13) 02/24/2017 (Originally 09/18/2014)  . INFLUENZA VACCINE  09/24/2016  . DEXA SCAN  Completed   Fall Risk  04/11/2015  Falls in the past year? No   Functional Status Survey:    Vitals:   09/16/16 1440  BP: (!) 146/76    Pulse: 62  Resp: 20  Temp: 98 F (36.7 C)  TempSrc: Oral  SpO2: 97%  Weight: 131 lb 12.8 oz (59.8 kg)  Height: 5\' 3"  (1.6 m)   Body mass index is 23.35 kg/m. Physical Exam  Constitutional: She is oriented to person, place, and time. No distress.  Frail, elderly female in no acute distress  HENT:  Head: Normocephalic and atraumatic.  Mouth/Throat: Oropharynx is clear and moist.  Eyes: Pupils are equal, round, and reactive to light. Conjunctivae and EOM are normal.  Neck: Normal range of motion. Neck supple.  Cardiovascular: Normal rate and regular rhythm.   Murmur heard. Pulmonary/Chest: Effort normal. No respiratory distress. She has no wheezes. She has no rales.  Decreased air entry bilaterally, on 2 liters oxygen by nasal canula  Abdominal: Soft. Bowel sounds are normal. She exhibits no distension. There is no tenderness. There is no guarding.  Musculoskeletal: She exhibits edema  and deformity.  Arthritis changes to her fingers. Able to move all 4 extremities, on wheelchair and needs 1 person assistance with transfer, pitting leg edema  Lymphadenopathy:    She has no cervical adenopathy.  Neurological: She is alert and oriented to person, place, and time.  Dysarthria present  Skin: Skin is warm and dry. No rash noted. She is not diaphoretic. No pallor.  Psychiatric: She has a normal mood and affect.    Labs reviewed:  Recent Labs  03/07/16 1611 03/07/16 2247 03/08/16 0512 05/29/16 07/08/16 07/14/16  NA 142 139 139 138 142 143  K 4.8 4.1 3.8 3.3* 3.1* 3.5  CL 108 105 106  --   --   --   CO2 28 24 26   --   --   --   GLUCOSE 113* 109* 101*  --   --   --   BUN 22 20 16 13 16 12   CREATININE 0.63 0.70 0.67 0.6 0.5 0.5  CALCIUM 8.8 9.0 8.6*  --   --   --     Recent Labs  01/01/16 1446 03/07/16 2247 03/08/16 0512 05/29/16 07/08/16  AST 16 38 39 21 25  ALT 11 61* 57* 21 24  ALKPHOS 88 127* 119 84 89  BILITOT 0.2 0.7 0.5  --   --   PROT 7.0 6.5 5.9*  --   --    ALBUMIN 4.0 3.1* 2.7*  --   --     Recent Labs  03/07/16 1611  03/09/16 0602 03/28/16 1715 04/11/16 1506  07/29/16 08/14/16 09/02/16  WBC 8.8  < > 7.5 6.5 5.9  < > 5.4 6.0 6.3  NEUTROABS 7.2  --   --  4.1 4.0  --   --   --   --   HGB 5.9 Repeated and verified X2.*  < > 8.1* 12.0 12.4  < > 9.8* 9.8* 3.7*  HCT 18.6 Repeated and verified X2.*  < > 25.6* 38.0 38.2  < > 30* 31* 10*  MCV 82.6  < > 85.0 85.1 85.1  --   --   --   --   PLT 343.0  < > 249 191.0 153.0  < > 200 186 187  < > = values in this interval not displayed. Lab Results  Component Value Date   TSH 2.01 01/01/2016   No results found for: HGBA1C Lab Results  Component Value Date   CHOL 260 (H) 04/11/2015   HDL 92.50 04/11/2015   LDLCALC 148 (H) 04/11/2015   LDLDIRECT 189.2 12/12/2008   TRIG 98.0 04/11/2015   CHOLHDL 3 04/11/2015    Significant Diagnostic Results in last 30 days:  No results found.  Assessment/Plan  Chronic respiratory failure With copd, no recent exacerbation. Continue bronchodilators and oxygen by nasal canula, monitor  COPD Continue chronic prednisone 5 mg daily with duoneb qid and guaifenesin as needed. No signs of exacerbation this visit  Severe Aortic stenosis Continue lasix with kcl and oxygen  Proximal non displaced right fibula shaft fracture has refused immobilizer. NWB to RLE. Continue tylenol, on wheelchair  Frequent falls With unsteady gait, she is supposed to call for assistance with transfer but tends to perform some transfers unassisted. High fall risk. Safety education and reinforcement  GAD Continue alprazolam 0.5 mg qid and monitor  Diarrhea Idiopathic, possible IBS with her anxiety, continue loperamide as needed for now  Anemia of chronic disease H/H 9.9/30.3, low but stable, monitor clinically    Family/ staff Communication:  reviewed care plan with patient and charge nurse.    Labs/tests ordered: none   Oneal Grout, MD Internal Medicine [facility] [address] [place], [place] [address] Cell Phone (Monday-Friday 8 am - 5 pm): [phone] On Call: [phone] and follow prompts after 5 pm and on weekends Office Phone: [phone] Office Fax: [phone]

## 2016-09-19 IMAGING — DX DG CHEST 2V
2 series · 2 of 2 positions shown · non-contrast
Comparison: 08/03/2015

CLINICAL DATA: COPD. Daughter states keeping check on lung nodules.
Shortness of breath.

EXAM:
CHEST  2 VIEW

[chest lat]
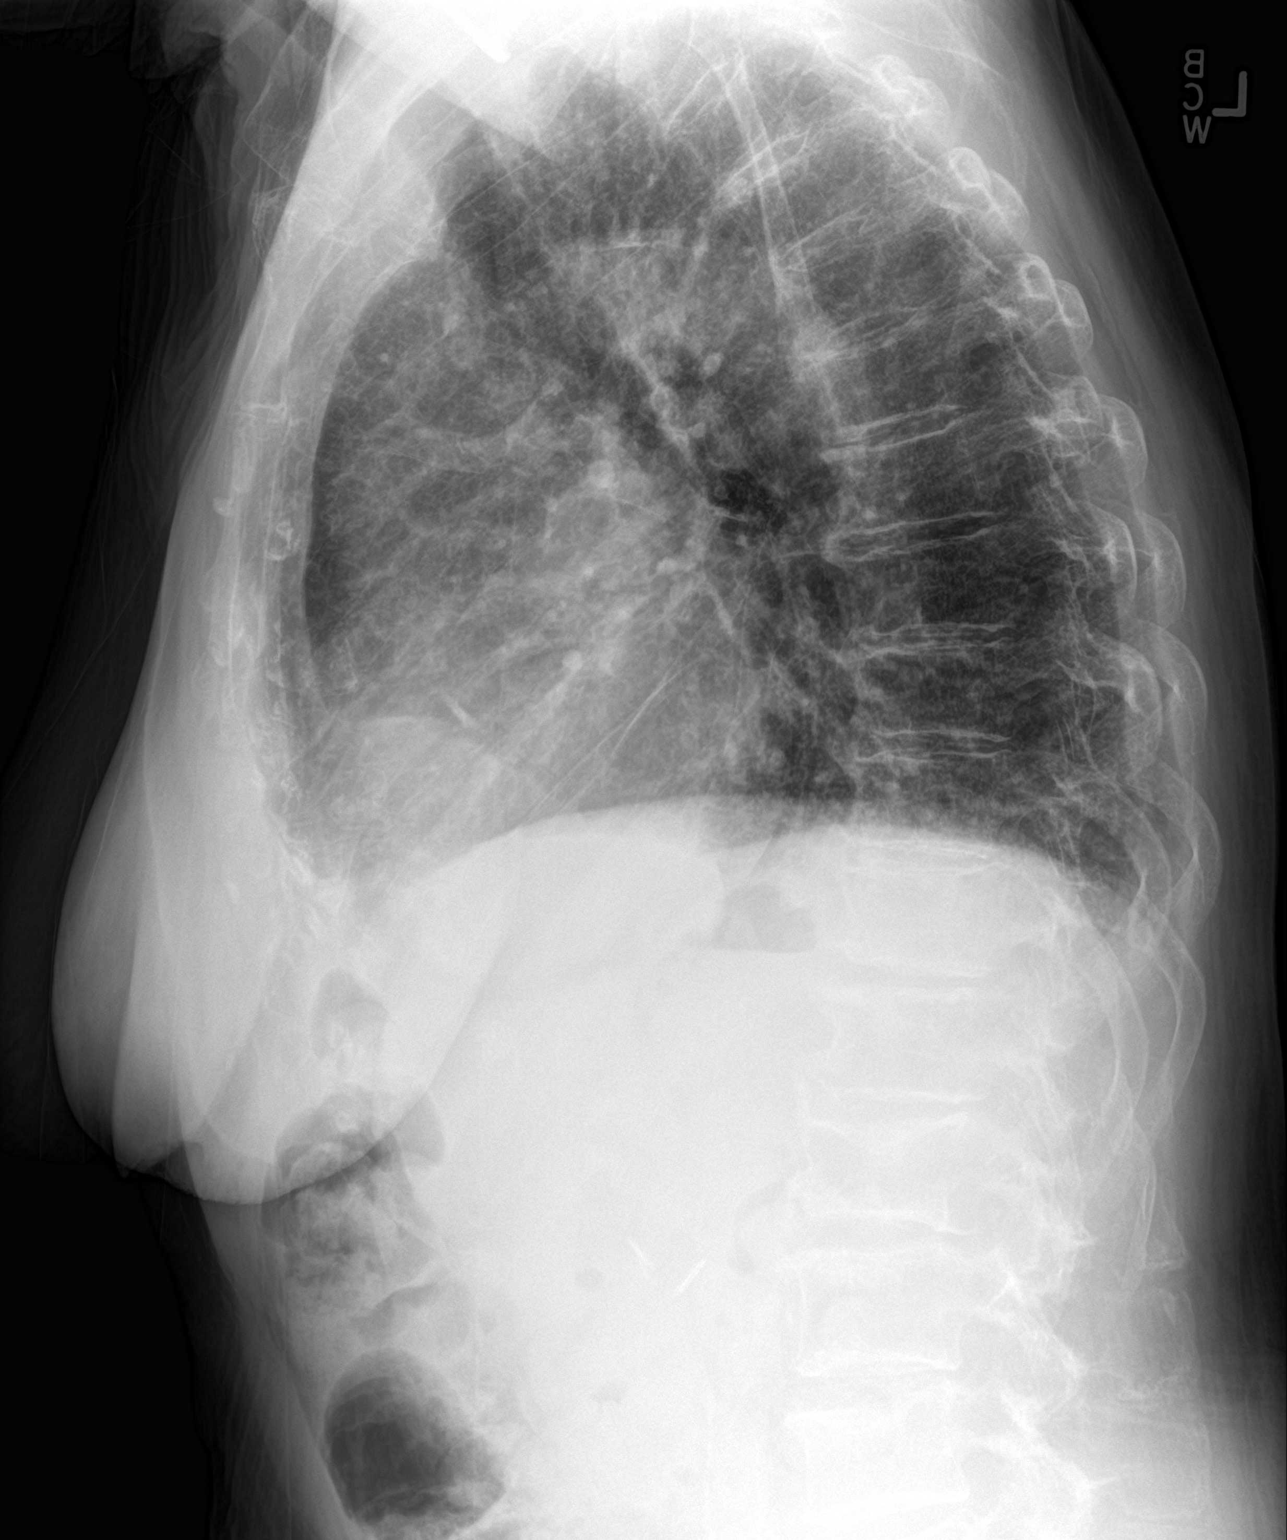

[chest ap]
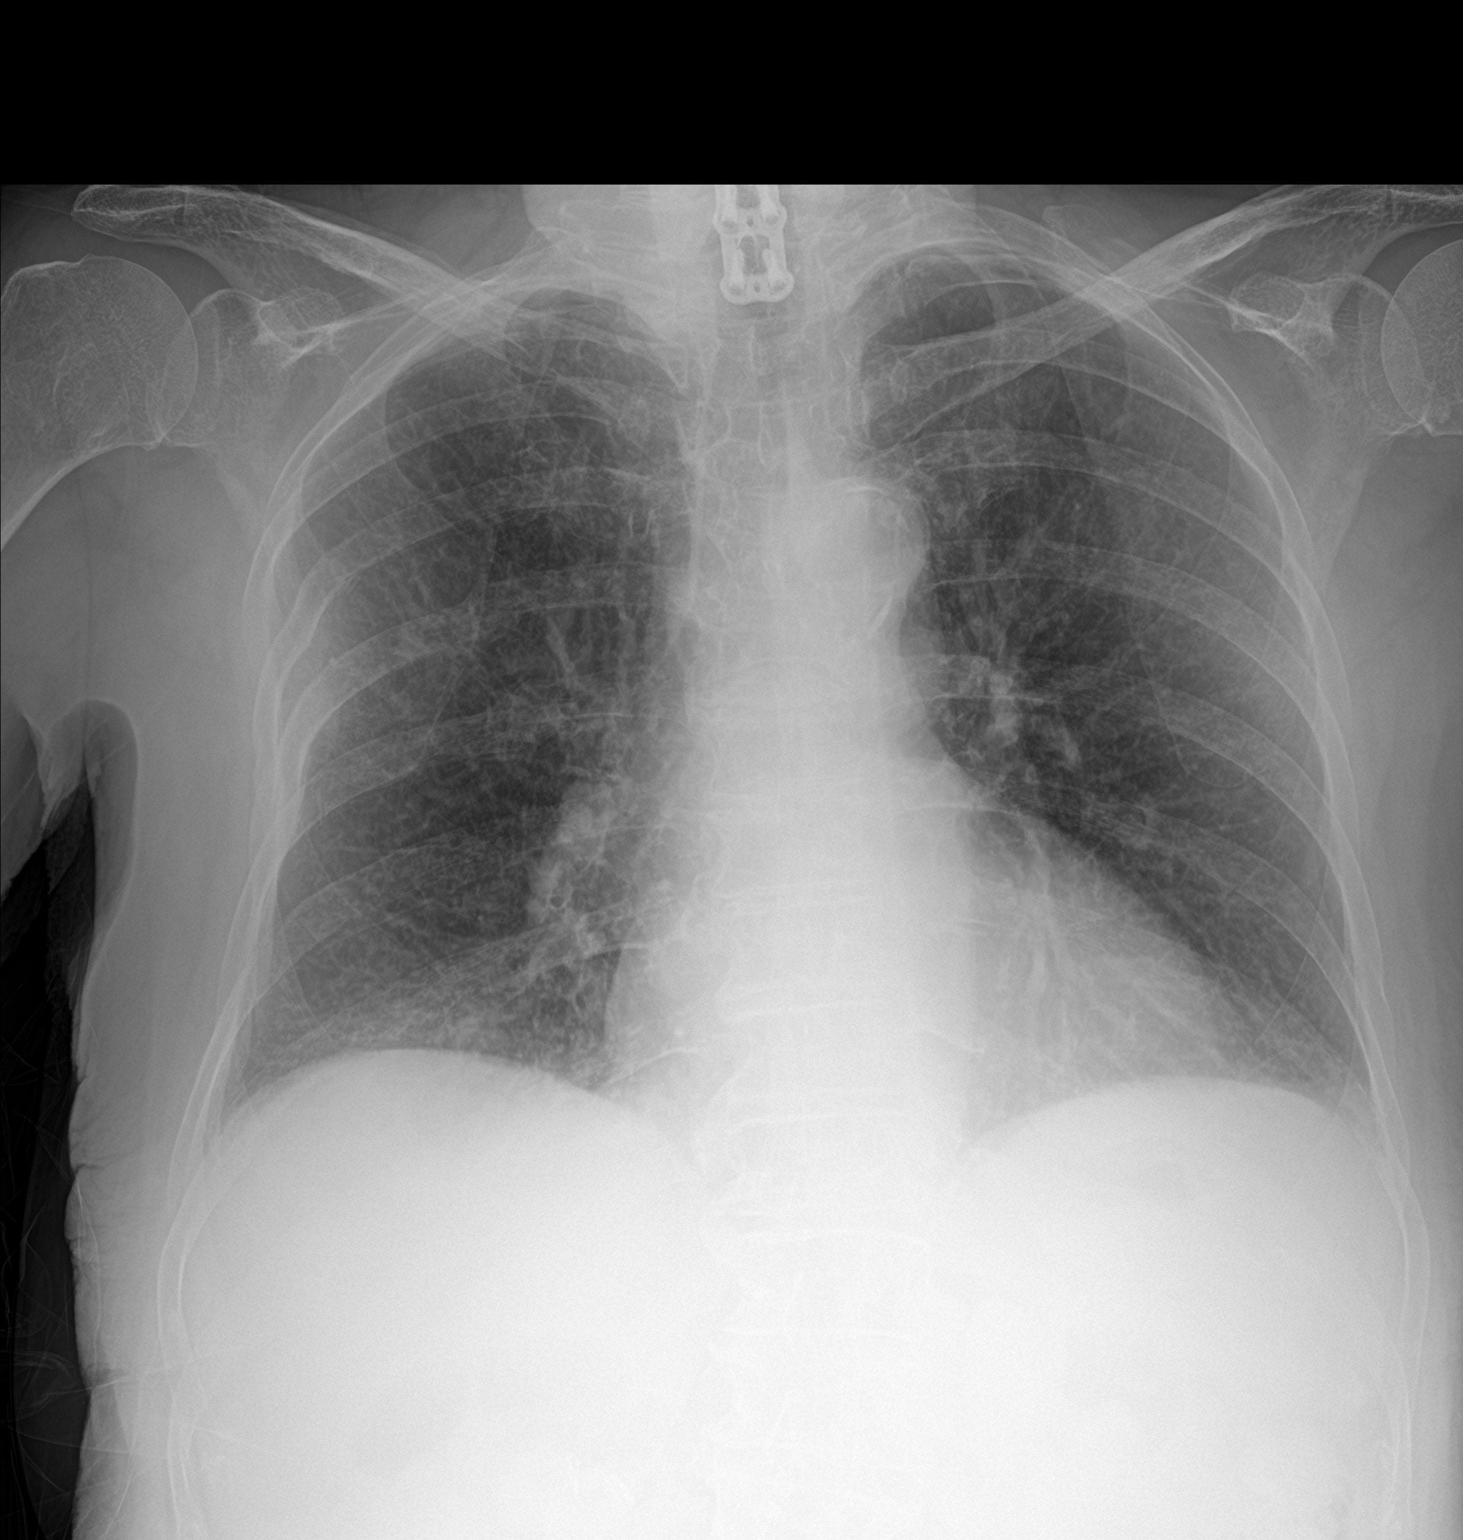

[2 of 2 positions shown; findings below may reference images not displayed]

FINDINGS: Lungs are adequately inflated with minimal subtle chronic
interstitial prominence. No focal airspace consolidation or
effusion. Cardiomediastinal silhouette is within normal. There is
calcified plaque over the thoracic aorta. There are mild
degenerative changes of the spine. Mild loss of height of a upper
lumbar vertebrae unchanged. Fusion hardware over the cervical spine
unchanged. Old right-sided rib fractures.
IMPRESSION: No acute cardiopulmonary disease. Subtle chronic interstitial
changes.

Aortic atherosclerosis.

## 2016-10-09 ENCOUNTER — Non-Acute Institutional Stay (SKILLED_NURSING_FACILITY): Payer: Medicare Other | Admitting: Internal Medicine

## 2016-10-09 ENCOUNTER — Encounter: Payer: Self-pay | Admitting: Internal Medicine

## 2016-10-09 DIAGNOSIS — J989 Respiratory disorder, unspecified: Secondary | ICD-10-CM

## 2016-10-09 DIAGNOSIS — F064 Anxiety disorder due to known physiological condition: Secondary | ICD-10-CM

## 2016-10-09 NOTE — Progress Notes (Signed)
Location:  [facility] Room Number: 31 Place of Service:  SNF ([phone]) Provider:  Oneal Grout, MD  Oneal Grout, MD  Patient Care Team: Oneal Grout, MD as PCP - General (Internal Medicine) Clance, Maree Krabbe, MD as Consulting Physician (Pulmonary Disease) Gean Birchwood, MD as Consulting Physician (Orthopedic Surgery) Barnett Abu, MD as Consulting Physician (Neurosurgery) Mast, Man X, NP as Nurse Practitioner (Internal Medicine)  Extended Emergency Contact Information Primary Emergency Contact: Sandi Mariscal Address: [address]          [place], [place] Phone: [phone] Relation: Daughter Secondary Emergency Contact: Royston Cowper [place] Mobile Phone: [phone] Relation: Relative  Code Status:  DNR Goals of care: Advanced Directive information Advanced Directives 08/18/2016  Does Patient Have a Medical Advance Directive? Yes  Type of Advance Directive Out of facility DNR (pink MOST or yellow form)  Does patient want to make changes to medical advance directive? No - Patient declined  Copy of Healthcare Power of Attorney in Chart? Yes  Would patient like information on creating a medical advance directive? -  Pre-existing out of facility DNR order (yellow form or pink MOST form) Yellow form placed in chart (order not valid for inpatient use)     Chief Complaint  Patient presents with  . Acute Visit    Anxiety and medication management    HPI:  Pt is a 83 y.o. female seen today for an acute visit for anxiety. She has been anxious lately. She is on scheduled alprazolam 0.5 mg qid. She has chronic respiratory failure with copd and AS and is on chronic oxygen. She has air hunger.    Past Medical History:  Diagnosis Date  . Alcohol abuse   . Anemia   . Anxiety   . Aortic stenosis    moderate on echo 09/2013  . Cervical spondylosis with myelopathy 01/2009   s/p decompression  .  COPD (chronic obstructive pulmonary disease) (HCC)   . Dementia 09/06/2015  . Depression   . Fall at home 05/2015  . Heart murmur   . History of blood transfusion 01/02/2016   "anemia"  . Hypertension   . On home oxygen therapy    "2L; 24/7" (01/02/2016)  . Osteoporosis    (R) hip fx 11/2009 & (L) hip fx 2006  . Speech impediment    present for >9 months; no one has really been able to discern an etiology/daughter/notes 01/01/2016   Past Surgical History:  Procedure Laterality Date  . BACK SURGERY    . BLADDER SUSPENSION     "unsuccessful"  . CATARACT EXTRACTION W/ INTRAOCULAR LENS  IMPLANT, BILATERAL Bilateral 06/06/13 - 2016   "right - left"  . FRACTURE SURGERY    . HIP FRACTURE SURGERY Left 1980s  . LAPAROSCOPIC CHOLECYSTECTOMY  1995  . ORIF HIP FRACTURE Right 11/2009   Done by Dr. Turner Daniels  . POSTERIOR LAMINECTOMY / DECOMPRESSION CERVICAL SPINE  01/2009   Done by Dr. Danielle Dess  . WRIST FRACTURE SURGERY Bilateral    "one in [place]; one [place]"    Allergies  Allergen Reactions  . Ativan [Lorazepam] Other (See Comments)    Causes increased confusion/AMS    Outpatient Encounter Prescriptions as of 10/09/2016  Medication Sig  . acetaminophen (TYLENOL) 500 MG tablet Take 1,000 mg by mouth 3 (three) times daily as needed.   . ALPRAZolam (XANAX) 0.5 MG tablet Take 0.5 mg by mouth 4 (four) times daily.   . benzonatate (  TESSALON) 100 MG capsule Take 1 capsule by mouth 3 (three) times daily. Give at 8 am, 2 pm and 8 pm  . furosemide (LASIX) 20 MG tablet Take 20 mg by mouth daily.  Marland Kitchen guaifenesin (ROBITUSSIN) 100 MG/5ML syrup Take by mouth. Take 10 ml every 4 hours as needed for cough  . ipratropium-albuterol (DUONEB) 0.5-2.5 (3) MG/3ML SOLN Take 3 mLs by nebulization. Use nebulizer four times daily  . loperamide (IMODIUM) 2 MG capsule 1 tab po bid prn diarrhea  . Melatonin 5 MG TABS Take 5 mg by mouth at bedtime.   . OXYGEN Inhale 2 L/min into the lungs continuous.  . potassium chloride  (K-DUR) 10 MEQ tablet Take 20 mEq by mouth daily.  . predniSONE (DELTASONE) 5 MG tablet Take 5 mg by mouth daily with breakfast.    No facility-administered encounter medications on file as of 10/09/2016.     Review of Systems  Constitutional: Negative for chills, diaphoresis and fever.  HENT: Negative for sore throat.   Respiratory: Positive for shortness of breath.   Cardiovascular: Positive for palpitations.  Gastrointestinal: Negative for nausea and vomiting.  Genitourinary: Negative for dysuria.  Musculoskeletal:       On wheelchair and needs assistance with transfer  Neurological: Positive for weakness.  Psychiatric/Behavioral: Positive for decreased concentration. Negative for dysphoric mood. The patient is nervous/anxious.     Immunization History  Administered Date(s) Administered  . Influenza Split 11/03/2011, 11/17/2013, 11/25/2014  . Influenza-Unspecified 12/12/2015, 12/19/2015  . PPD Test 06/06/2015, 06/20/2015  . Pneumococcal Polysaccharide-23 09/17/2013  . Tdap 07/04/2016   Pertinent  Health Maintenance Due  Topic Date Due  . INFLUENZA VACCINE  11/24/2016 (Originally 09/24/2016)  . PNA vac Low Risk Adult (2 of 2 - PCV13) 02/24/2017 (Originally 09/18/2014)  . DEXA SCAN  Completed   Fall Risk  04/11/2015  Falls in the past year? No   Functional Status Survey:    Vitals:   10/09/16 1251  BP: 130/80  Pulse: 88  Resp: 20  Temp: 98.2 F (36.8 C)  TempSrc: Oral  SpO2: 96%  Weight: 128 lb 14.4 oz (58.5 kg)  Height: 5\' 8"  (1.727 m)   Body mass index is 19.6 kg/m. Physical Exam  Constitutional:  Frail, elderly, thin built  HENT:  Head: Normocephalic and atraumatic.  Eyes: Pupils are equal, round, and reactive to light. Conjunctivae are normal.  Neck: Neck supple.  Cardiovascular: Normal rate.   Murmur heard. Pulmonary/Chest:  On 2 l o2 by [place]. Uses her accessory muscles at times, poor air entry bilaterally.   Abdominal: Soft. Bowel sounds are normal.    Musculoskeletal: She exhibits edema.  Can move all 4 extremities, on wheelchair, has mild tremor  Lymphadenopathy:    She has no cervical adenopathy.  Neurological: She is alert.  Skin: Skin is warm and dry. She is not diaphoretic.  Psychiatric:  anxious    Labs reviewed:  Recent Labs  03/07/16 1611 03/07/16 2247 03/08/16 0512 05/29/16 07/08/16 07/14/16  NA 142 139 139 138 142 143  K 4.8 4.1 3.8 3.3* 3.1* 3.5  CL 108 105 106  --   --   --   CO2 28 24 26   --   --   --   GLUCOSE 113* 109* 101*  --   --   --   BUN 22 20 16 13 16 12   CREATININE 0.63 0.70 0.67 0.6 0.5 0.5  CALCIUM 8.8 9.0 8.6*  --   --   --  Recent Labs  01/01/16 1446 03/07/16 2247 03/08/16 0512 05/29/16 07/08/16  AST 16 38 39 21 25  ALT 11 61* 57* 21 24  ALKPHOS 88 127* 119 84 89  BILITOT 0.2 0.7 0.5  --   --   PROT 7.0 6.5 5.9*  --   --   ALBUMIN 4.0 3.1* 2.7*  --   --     Recent Labs  03/07/16 1611  03/09/16 0602 03/28/16 1715 04/11/16 1506  07/29/16 08/14/16 09/02/16  WBC 8.8  < > 7.5 6.5 5.9  < > 5.4 6.0 6.3  NEUTROABS 7.2  --   --  4.1 4.0  --   --   --   --   HGB 5.9 Repeated and verified X2.*  < > 8.1* 12.0 12.4  < > 9.8* 9.8* 3.7*  HCT 18.6 Repeated and verified X2.*  < > 25.6* 38.0 38.2  < > 30* 31* 10*  MCV 82.6  < > 85.0 85.1 85.1  --   --   --   --   PLT 343.0  < > 249 191.0 153.0  < > 200 186 187  < > = values in this interval not displayed. Lab Results  Component Value Date   TSH 2.01 01/01/2016   No results found for: HGBA1C Lab Results  Component Value Date   CHOL 260 (H) 04/11/2015   HDL 92.50 04/11/2015   LDLCALC 148 (H) 04/11/2015   LDLDIRECT 189.2 12/12/2008   TRIG 98.0 04/11/2015   CHOLHDL 3 04/11/2015    Significant Diagnostic Results in last 30 days:  No results found.  Assessment/Plan  Anxiety due to physiologic condition With her COPD and severe Aortic stenosis making her short of breath and thus anxious. Currently on alprazolam 0.5 mg four times  daily. Will not attempt GDR at present as this will cause deterioration of her condition. Start sertraline 12.5 mg daily at bedtime and reassess in 2 weeks. If tolerating well and this helps with her nerves some, increase dosing. Monitor clinically. Supportive care.   Chronic respiratory failure No recent exacerbation of COPD. Has COPD, AS and anxiety. Continue oxygen by nasal canula and monitor breathing. Initiating sertraline hopefully will help with her anxiety and help reduce her breathing.    Family/ staff Communication:   Labs/tests ordered:    Oneal GroutMAHIMA Tynell Winchell, MD Internal Medicine [facility] [address] [place], [place] [address] Cell Phone (Monday-Friday 8 am - 5 pm): [phone] On Call: [phone] and follow prompts after 5 pm and on weekends Office Phone: [phone] Office Fax: [phone]

## 2016-10-09 NOTE — Progress Notes (Signed)
Opened in error    Review of Systems  Constitutional: Negative for chills and fever.  HENT: Negative for congestion, mouth sores, sinus pressure, sore throat and trouble swallowing.   Respiratory: Positive for shortness of breath. Negative for cough and wheezing.   Cardiovascular: Negative for chest pain and palpitations.  Gastrointestinal: Positive for diarrhea. Negative for abdominal pain, constipation, nausea and vomiting.  Genitourinary: Negative for dysuria.  Musculoskeletal: Positive for gait problem.       On wheelchair, needs assistance with transfer  Skin: Negative for wound.  Neurological: Positive for weakness. Negative for dizziness.  Psychiatric/Behavioral: Negative for behavioral problems.   Physical Exam  Constitutional: She is oriented to person, place, and time. No distress.  Frail, elderly female in no acute distress  HENT:  Head: Normocephalic and atraumatic.  Mouth/Throat: Oropharynx is clear and moist.  Eyes: Pupils are equal, round, and reactive to light. Conjunctivae and EOM are normal.  Neck: Normal range of motion. Neck supple.  Cardiovascular: Normal rate and regular rhythm.   Murmur heard. Pulmonary/Chest: Effort normal. No respiratory distress. She has no wheezes. She has no rales.  Decreased air entry bilaterally, on 2 liters oxygen by nasal canula  Abdominal: Soft. Bowel sounds are normal. She exhibits no distension. There is no tenderness. There is no guarding.  Musculoskeletal: She exhibits edema and deformity.  Arthritis changes to her fingers. Able to move all 4 extremities, on wheelchair and needs 1 person assistance with transfer, pitting leg edema  Lymphadenopathy:    She has no cervical adenopathy.  Neurological: She is alert and oriented to person, place, and time.  Dysarthria present  Skin: Skin is warm and dry. No rash noted. She is not diaphoretic. No pallor.  Psychiatric: She has a normal mood and affect.

## 2016-10-09 NOTE — Patient Instructions (Signed)
Opened in error

## 2016-10-15 DIAGNOSIS — D500 Iron deficiency anemia secondary to blood loss (chronic): Secondary | ICD-10-CM | POA: Diagnosis not present

## 2016-10-15 DIAGNOSIS — I10 Essential (primary) hypertension: Secondary | ICD-10-CM | POA: Diagnosis not present

## 2016-10-15 LAB — CBC AND DIFFERENTIAL
HCT: 32 — AB (ref 36–46)
HEMOGLOBIN: 10 — AB (ref 12.0–16.0)
PLATELETS: 200 (ref 150–399)
WBC: 8.3

## 2016-10-16 ENCOUNTER — Other Ambulatory Visit: Payer: Self-pay | Admitting: *Deleted

## 2016-10-17 ENCOUNTER — Encounter: Payer: Self-pay | Admitting: Nurse Practitioner

## 2016-10-17 ENCOUNTER — Non-Acute Institutional Stay (SKILLED_NURSING_FACILITY): Payer: Medicare Other | Admitting: Nurse Practitioner

## 2016-10-17 DIAGNOSIS — J9611 Chronic respiratory failure with hypoxia: Secondary | ICD-10-CM

## 2016-10-17 DIAGNOSIS — F419 Anxiety disorder, unspecified: Secondary | ICD-10-CM

## 2016-10-17 DIAGNOSIS — I10 Essential (primary) hypertension: Secondary | ICD-10-CM | POA: Diagnosis not present

## 2016-10-17 DIAGNOSIS — F329 Major depressive disorder, single episode, unspecified: Secondary | ICD-10-CM | POA: Diagnosis not present

## 2016-10-17 NOTE — Progress Notes (Signed)
Location:  [facility] Room Number: 31 Place of Service:  SNF (31) Provider:  Ned Kakar, Manxie  NP  Oneal Grout, MD  Patient Care Team: Oneal Grout, MD as PCP - General (Internal Medicine) Clance, Maree Krabbe, MD as Consulting Physician (Pulmonary Disease) Gean Birchwood, MD as Consulting Physician (Orthopedic Surgery) Barnett Abu, MD as Consulting Physician (Neurosurgery) Maythe Deramo X, NP as Nurse Practitioner (Internal Medicine)  Extended Emergency Contact Information Primary Emergency Contact: Sandi Mariscal Address: [address]          [place], [place] Phone: [phone] Relation: Daughter Secondary Emergency Contact: Royston Cowper [place] Mobile Phone: [phone] Relation: Relative  Code Status:  DNR Goals of care: Advanced Directive information Advanced Directives 10/17/2016  Does Patient Have a Medical Advance Directive? Yes  Type of Advance Directive Out of facility DNR (pink MOST or yellow form);Healthcare Power of [place];Living will  Does patient want to make changes to medical advance directive? No - Patient declined  Copy of Healthcare Power of Attorney in Chart? Yes  Would patient like information on creating a medical advance directive? -  Pre-existing out of facility DNR order (yellow form or pink MOST form) Yellow form placed in chart (order not valid for inpatient use)     Chief Complaint  Patient presents with  . Medical Management of Chronic Issues    HPI:  Pt is a 83 y.o. female seen today for medical management of chronic diseases.     The patient has history of anxiety, taking Alprazolam 0.5mg  qid, Sertraline 12.5mg  daily started 10/09/16, the patient is still anxious, staff reported the patient gets up short after assisted to bed at night, then propels self in w/c up and down on unit. Hx of HTN, blood pressure is controlled, taking Furosemide 20mg  and Kcl  daily. She has COPD with chronic hypoxia, wears O2 2lpm via nasal canula at sleep and awake.      Past Medical History:  Diagnosis Date  . Alcohol abuse   . Anemia   . Anxiety   . Aortic stenosis    moderate on echo 09/2013  . Cervical spondylosis with myelopathy 01/2009   s/p decompression  . COPD (chronic obstructive pulmonary disease) (HCC)   . Dementia 09/06/2015  . Depression   . Fall at home 05/2015  . Heart murmur   . History of blood transfusion 01/02/2016   "anemia"  . Hypertension   . On home oxygen therapy    "2L; 24/7" (01/02/2016)  . Osteoporosis    (R) hip fx 11/2009 & (L) hip fx 2006  . Speech impediment    present for >9 months; no one has really been able to discern an etiology/daughter/notes 01/01/2016   Past Surgical History:  Procedure Laterality Date  . BACK SURGERY    . BLADDER SUSPENSION     "unsuccessful"  . CATARACT EXTRACTION W/ INTRAOCULAR LENS  IMPLANT, BILATERAL Bilateral 06/06/13 - 2016   "right - left"  . FRACTURE SURGERY    . HIP FRACTURE SURGERY Left 1980s  . LAPAROSCOPIC CHOLECYSTECTOMY  1995  . ORIF HIP FRACTURE Right 11/2009   Done by Dr. Turner Daniels  . POSTERIOR LAMINECTOMY / DECOMPRESSION CERVICAL SPINE  01/2009   Done by Dr. Danielle Dess  . WRIST FRACTURE SURGERY Bilateral    "one in [place]; one [place]"    Allergies  Allergen Reactions  . Ativan [Lorazepam] Other (See Comments)    Causes increased confusion/AMS  Outpatient Encounter Prescriptions as of 10/17/2016  Medication Sig  . acetaminophen (TYLENOL) 500 MG tablet Take 1,000 mg by mouth 3 (three) times daily as needed.   . ALPRAZolam (XANAX) 0.5 MG tablet Take 0.5 mg by mouth 4 (four) times daily.   . benzonatate (TESSALON) 100 MG capsule Take 1 capsule by mouth 3 (three) times daily. Give at 8 am, 2 pm and 8 pm  . furosemide (LASIX) 20 MG tablet Take 20 mg by mouth daily.  Marland Kitchen guaifenesin (ROBITUSSIN) 100 MG/5ML syrup Take by mouth. Take 10 ml every 4 hours as needed for cough  .  ipratropium-albuterol (DUONEB) 0.5-2.5 (3) MG/3ML SOLN Take 3 mLs by nebulization. Use nebulizer four times daily  . loperamide (IMODIUM) 2 MG capsule 1 tab po bid prn diarrhea  . Melatonin 5 MG TABS Take 5 mg by mouth at bedtime.   . OXYGEN Inhale 2 L/min into the lungs continuous.  . potassium chloride (K-DUR) 10 MEQ tablet Take 20 mEq by mouth daily.  . predniSONE (DELTASONE) 5 MG tablet Take 5 mg by mouth daily with breakfast.    No facility-administered encounter medications on file as of 10/17/2016.   ROS is proveded  Review of Systems  Constitutional: Negative for activity change, appetite change, chills, fatigue and fever.  HENT: Negative for congestion and sore throat.   Eyes: Negative for visual disturbance.  Respiratory: Positive for cough and shortness of breath. Negative for chest tightness and wheezing.   Cardiovascular: Negative for chest pain, palpitations and leg swelling.  Gastrointestinal: Negative for abdominal distention, abdominal pain, diarrhea, nausea and vomiting.  Genitourinary: Negative for difficulty urinating, dysuria and urgency.  Musculoskeletal: Positive for gait problem. Negative for back pain and joint swelling.       Self propelling wheelchair to get around  Skin: Negative for color change, pallor and rash.  Neurological: Positive for speech difficulty. Negative for dizziness, tremors, seizures and headaches.       Dysarthria, hesitate, guttural   Psychiatric/Behavioral: Positive for behavioral problems, confusion and sleep disturbance. Negative for agitation and hallucinations. The patient is nervous/anxious.        Anxious, confusion, but less angry than prior    Immunization History  Administered Date(s) Administered  . Influenza Split 11/03/2011, 11/17/2013, 11/25/2014  . Influenza-Unspecified 12/12/2015, 12/19/2015  . PPD Test 06/06/2015, 06/20/2015  . Pneumococcal Polysaccharide-23 09/17/2013  . Tdap 07/04/2016   Pertinent  Health Maintenance  Due  Topic Date Due  . INFLUENZA VACCINE  11/24/2016 (Originally 09/24/2016)  . PNA vac Low Risk Adult (2 of 2 - PCV13) 02/24/2017 (Originally 09/18/2014)  . DEXA SCAN  Completed   Fall Risk  04/11/2015  Falls in the past year? No   Functional Status Survey:    Vitals:   10/17/16 0933  BP: 138/80  Pulse: 88  Resp: 20  Temp: 97.8 F (36.6 C)  SpO2: 98%  Weight: 128 lb 14.4 oz (58.5 kg)  Height: 5\' 8"  (1.727 m)   Body mass index is 19.6 kg/m. Physical Exam  Constitutional: She appears well-developed and well-nourished. No distress.  HENT:  Head: Normocephalic and atraumatic.  Eyes: Pupils are equal, round, and reactive to light. Conjunctivae and EOM are normal.  Neck: Normal range of motion. Neck supple. No thyromegaly present.  Cardiovascular: Normal rate.   Murmur heard. 3-4/6 systolic murmurs  Pulmonary/Chest: Effort normal and breath sounds normal. No respiratory distress. She has no wheezes. She has no rales.  O2 dependent, decreased air entry  Abdominal: Soft. Bowel  sounds are normal. She exhibits no distension. There is no tenderness.  Musculoskeletal: Normal range of motion. She exhibits no edema or tenderness.  Neurological: She is alert.  Oriented to self and her room  Skin: Skin is warm and dry. No rash noted. She is not diaphoretic. No erythema.  Psychiatric: She has a normal mood and affect.  Anxious facial looks.     Labs reviewed:  Recent Labs  03/07/16 1611 03/07/16 2247 03/08/16 0512 05/29/16 07/08/16 07/14/16  NA 142 139 139 138 142 143  K 4.8 4.1 3.8 3.3* 3.1* 3.5  CL 108 105 106  --   --   --   CO2 28 24 26   --   --   --   GLUCOSE 113* 109* 101*  --   --   --   BUN 22 20 16 13 16 12   CREATININE 0.63 0.70 0.67 0.6 0.5 0.5  CALCIUM 8.8 9.0 8.6*  --   --   --     Recent Labs  01/01/16 1446 03/07/16 2247 03/08/16 0512 05/29/16 07/08/16  AST 16 38 39 21 25  ALT 11 61* 57* 21 24  ALKPHOS 88 127* 119 84 89  BILITOT 0.2 0.7 0.5  --   --     PROT 7.0 6.5 5.9*  --   --   ALBUMIN 4.0 3.1* 2.7*  --   --     Recent Labs  03/07/16 1611  03/09/16 0602 03/28/16 1715 04/11/16 1506  08/14/16 09/02/16 10/15/16  WBC 8.8  < > 7.5 6.5 5.9  < > 6.0 6.3 8.3  NEUTROABS 7.2  --   --  4.1 4.0  --   --   --   --   HGB 5.9 Repeated and verified X2.*  < > 8.1* 12.0 12.4  < > 9.8* 3.7* 10.0*  HCT 18.6 Repeated and verified X2.*  < > 25.6* 38.0 38.2  < > 31* 10* 32*  MCV 82.6  < > 85.0 85.1 85.1  --   --   --   --   PLT 343.0  < > 249 191.0 153.0  < > 186 187 200  < > = values in this interval not displayed. Lab Results  Component Value Date   TSH 2.01 01/01/2016   No results found for: HGBA1C Lab Results  Component Value Date   CHOL 260 (H) 04/11/2015   HDL 92.50 04/11/2015   LDLCALC 148 (H) 04/11/2015   LDLDIRECT 189.2 12/12/2008   TRIG 98.0 04/11/2015   CHOLHDL 3 04/11/2015    Significant Diagnostic Results in last 30 days:  No results found.  Assessment/Plan Anxiety and depression aking Alprazolam 0.5mg  qid, Sertraline 12.5mg  daily started 10/09/16, the patient is still anxious, staff reported the patient gets up short after assisted to bed at night, then propels self in w/c up and down on unit.  Will increase Sertraline to 25mg  po qd, observe the patient.   Essential hypertension Blood pressure is controlled, continue Furosemide 20mg , Kcl po daily.   Chronic respiratory failure with hypoxia (HCC) Maintained on Tessalon tid po, Robitussin 72ml q4h prn, DuoNeb qid, Prednisone 5mg  qd, O2 via [place]     Family/ staff Communication: plan of care reviewed with the patient and charge nurse  Labs/tests ordered:  none  Time spend 25 minutes

## 2016-10-17 NOTE — Assessment & Plan Note (Signed)
Blood pressure is controlled, continue Furosemide 20mg , Kcl po daily.

## 2016-10-17 NOTE — Assessment & Plan Note (Signed)
aking Alprazolam 0.5mg  qid, Sertraline 12.5mg  daily started 10/09/16, the patient is still anxious, staff reported the patient gets up short after assisted to bed at night, then propels self in w/c up and down on unit.  Will increase Sertraline to 25mg  po qd, observe the patient.

## 2016-10-17 NOTE — Assessment & Plan Note (Signed)
Maintained on Tessalon tid po, Robitussin 7ml q4h prn, DuoNeb qid, Prednisone 5mg  qd, O2 via [place]

## 2016-10-22 ENCOUNTER — Non-Acute Institutional Stay (SKILLED_NURSING_FACILITY): Payer: Medicare Other | Admitting: Nurse Practitioner

## 2016-10-22 ENCOUNTER — Encounter: Payer: Self-pay | Admitting: Nurse Practitioner

## 2016-10-22 DIAGNOSIS — R042 Hemoptysis: Secondary | ICD-10-CM | POA: Insufficient documentation

## 2016-10-22 DIAGNOSIS — R918 Other nonspecific abnormal finding of lung field: Secondary | ICD-10-CM

## 2016-10-22 DIAGNOSIS — J9611 Chronic respiratory failure with hypoxia: Secondary | ICD-10-CM | POA: Diagnosis not present

## 2016-10-22 DIAGNOSIS — R05 Cough: Secondary | ICD-10-CM | POA: Diagnosis not present

## 2016-10-22 NOTE — Assessment & Plan Note (Signed)
chronic cough, oxygen dependent, takes Prednisone 5mg  daily, DuoNeb qid and Tessalon tid.

## 2016-10-22 NOTE — Progress Notes (Signed)
Location:  [facility] Room Number: 31 Place of Service:  SNF (31) Provider:  Jaquaveon Bilal, Manxie  NP  Oneal Grout, MD  Patient Care Team: Oneal Grout, MD as PCP - General (Internal Medicine) Clance, Maree Krabbe, MD as Consulting Physician (Pulmonary Disease) Gean Birchwood, MD as Consulting Physician (Orthopedic Surgery) Barnett Abu, MD as Consulting Physician (Neurosurgery) Addalie Calles X, NP as Nurse Practitioner (Internal Medicine)  Extended Emergency Contact Information Primary Emergency Contact: Sandi Mariscal Address: [address]          [place], [place] Phone: [phone] Relation: Daughter Secondary Emergency Contact: Royston Cowper [place] Mobile Phone: [phone] Relation: Relative  Code Status:  DNR Goals of care: Advanced Directive information Advanced Directives 10/22/2016  Does Patient Have a Medical Advance Directive? Yes  Type of Advance Directive Out of facility DNR (pink MOST or yellow form);Healthcare Power of [place];Living will  Does patient want to make changes to medical advance directive? No - Patient declined  Copy of Healthcare Power of Attorney in Chart? Yes  Would patient like information on creating a medical advance directive? -  Pre-existing out of facility DNR order (yellow form or pink MOST form) Yellow form placed in chart (order not valid for inpatient use)     Chief Complaint  Patient presents with  . Acute Visit    Coughing up blood in sputum,    HPI:  Pt is a 83 y.o. female seen today for an acute visit for bloody sputums x 2, thick like jello consistency on different occasions in the past 2 days, Guaiac positive, amount about a table spoon, unified color in bright red but not as deep as blood. She denied mouth/throat/chest pain. She has chronic lung disease, chronic cough, oxygen dependent, takes Prednisone 5mg  daily, DuoNeb qid and Tessalon tid. Hx of  pulmonary nodules, CT 04/20/15 first confirmed.    Past Medical History:  Diagnosis Date  . Alcohol abuse   . Anemia   . Anxiety   . Aortic stenosis    moderate on echo 09/2013  . Cervical spondylosis with myelopathy 01/2009   s/p decompression  . COPD (chronic obstructive pulmonary disease) (HCC)   . Dementia 09/06/2015  . Depression   . Fall at home 05/2015  . Heart murmur   . History of blood transfusion 01/02/2016   "anemia"  . Hypertension   . On home oxygen therapy    "2L; 24/7" (01/02/2016)  . Osteoporosis    (R) hip fx 11/2009 & (L) hip fx 2006  . Speech impediment    present for >9 months; no one has really been able to discern an etiology/daughter/notes 01/01/2016   Past Surgical History:  Procedure Laterality Date  . BACK SURGERY    . BLADDER SUSPENSION     "unsuccessful"  . CATARACT EXTRACTION W/ INTRAOCULAR LENS  IMPLANT, BILATERAL Bilateral 06/06/13 - 2016   "right - left"  . FRACTURE SURGERY    . HIP FRACTURE SURGERY Left 1980s  . LAPAROSCOPIC CHOLECYSTECTOMY  1995  . ORIF HIP FRACTURE Right 11/2009   Done by Dr. Turner Daniels  . POSTERIOR LAMINECTOMY / DECOMPRESSION CERVICAL SPINE  01/2009   Done by Dr. Danielle Dess  . WRIST FRACTURE SURGERY Bilateral    "one in [place]; one [place]"    Allergies  Allergen Reactions  . Ativan [Lorazepam] Other (See Comments)    Causes increased confusion/AMS    Outpatient Encounter Prescriptions as of 10/22/2016  Medication Sig  .  acetaminophen (TYLENOL) 500 MG tablet Take 1,000 mg by mouth 3 (three) times daily as needed.   . ALPRAZolam (XANAX) 0.5 MG tablet Take 0.5 mg by mouth 4 (four) times daily.   . benzonatate (TESSALON) 100 MG capsule Take 1 capsule by mouth 3 (three) times daily. Give at 8 am, 2 pm and 8 pm  . furosemide (LASIX) 20 MG tablet Take 20 mg by mouth daily.  Marland Kitchen guaifenesin (ROBITUSSIN) 100 MG/5ML syrup Take by mouth. Take 10 ml every 4 hours as needed for cough  . ipratropium-albuterol (DUONEB) 0.5-2.5 (3) MG/3ML  SOLN Take 3 mLs by nebulization. Use nebulizer four times daily  . loperamide (IMODIUM) 2 MG capsule 1 tab po bid prn diarrhea  . Melatonin 5 MG TABS Take 5 mg by mouth at bedtime.   . OXYGEN Inhale 2 L/min into the lungs continuous.  . potassium chloride (K-DUR) 10 MEQ tablet Take 20 mEq by mouth daily.  . predniSONE (DELTASONE) 5 MG tablet Take 5 mg by mouth daily with breakfast.   . sertraline (ZOLOFT) 25 MG tablet Take 25 mg by mouth at bedtime.   No facility-administered encounter medications on file as of 10/22/2016.    ROS was provided with assistance of staff Review of Systems  Constitutional: Negative for appetite change, chills, diaphoresis, fatigue and fever.  HENT: Positive for hearing loss. Negative for congestion, mouth sores, nosebleeds and sore throat.   Respiratory: Positive for cough and shortness of breath. Negative for apnea, choking, chest tightness, wheezing and stridor.   Cardiovascular: Negative for chest pain, palpitations and leg swelling.  Gastrointestinal: Negative for abdominal distention, abdominal pain, constipation, diarrhea, nausea and vomiting.  Psychiatric/Behavioral: The patient is nervous/anxious.        Chronic    Immunization History  Administered Date(s) Administered  . Influenza Split 11/03/2011, 11/17/2013, 11/25/2014  . Influenza-Unspecified 12/12/2015, 12/19/2015  . PPD Test 06/06/2015, 06/20/2015  . Pneumococcal Polysaccharide-23 09/17/2013  . Tdap 07/04/2016   Pertinent  Health Maintenance Due  Topic Date Due  . INFLUENZA VACCINE  11/24/2016 (Originally 09/24/2016)  . PNA vac Low Risk Adult (2 of 2 - PCV13) 02/24/2017 (Originally 09/18/2014)  . DEXA SCAN  Completed   Fall Risk  04/11/2015  Falls in the past year? No   Functional Status Survey:    Vitals:   10/22/16 0919  BP: 128/62  Pulse: 72  Temp: (!) 97.5 F (36.4 C)  SpO2: 92%  Weight: 128 lb 14.4 oz (58.5 kg)   Body mass index is 19.6 kg/m. Physical Exam    Constitutional: She appears well-developed and well-nourished. No distress.  HENT:  Head: Normocephalic and atraumatic.  Nose: Nose normal.  Mouth/Throat: Oropharynx is clear and moist. No oropharyngeal exudate.  Neck: Normal range of motion. Neck supple. No tracheal deviation present.  Cardiovascular: Normal rate, regular rhythm and normal heart sounds.   Pulmonary/Chest: Effort normal. No respiratory distress. She has no wheezes. She has no rales. She exhibits no tenderness.  Decreased air entry.   Neurological: She is alert.  Oriented to person and place.   Skin: She is not diaphoretic.    Labs reviewed:  Recent Labs  03/07/16 1611 03/07/16 2247 03/08/16 0512 05/29/16 07/08/16 07/14/16  NA 142 139 139 138 142 143  K 4.8 4.1 3.8 3.3* 3.1* 3.5  CL 108 105 106  --   --   --   CO2 28 24 26   --   --   --   GLUCOSE 113* 109* 101*  --   --   --  BUN 22 20 16 13 16 12   CREATININE 0.63 0.70 0.67 0.6 0.5 0.5  CALCIUM 8.8 9.0 8.6*  --   --   --     Recent Labs  01/01/16 1446 03/07/16 2247 03/08/16 0512 05/29/16 07/08/16  AST 16 38 39 21 25  ALT 11 61* 57* 21 24  ALKPHOS 88 127* 119 84 89  BILITOT 0.2 0.7 0.5  --   --   PROT 7.0 6.5 5.9*  --   --   ALBUMIN 4.0 3.1* 2.7*  --   --     Recent Labs  03/07/16 1611  03/09/16 0602 03/28/16 1715 04/11/16 1506  08/14/16 09/02/16 10/15/16  WBC 8.8  < > 7.5 6.5 5.9  < > 6.0 6.3 8.3  NEUTROABS 7.2  --   --  4.1 4.0  --   --   --   --   HGB 5.9 Repeated and verified X2.*  < > 8.1* 12.0 12.4  < > 9.8* 3.7* 10.0*  HCT 18.6 Repeated and verified X2.*  < > 25.6* 38.0 38.2  < > 31* 10* 32*  MCV 82.6  < > 85.0 85.1 85.1  --   --   --   --   PLT 343.0  < > 249 191.0 153.0  < > 186 187 200  < > = values in this interval not displayed. Lab Results  Component Value Date   TSH 2.01 01/01/2016   No results found for: HGBA1C Lab Results  Component Value Date   CHOL 260 (H) 04/11/2015   HDL 92.50 04/11/2015   LDLCALC 148 (H)  04/11/2015   LDLDIRECT 189.2 12/12/2008   TRIG 98.0 04/11/2015   CHOLHDL 3 04/11/2015    Significant Diagnostic Results in last 30 days:  No results found.  Assessment/Plan Cough with hemoptysis bloody sputums x 2, thick like jello consistency on different occasions in the past 2 days, Guaiac positive, amount about a table spoon, unified color in bright red but not as deep as blood. She denied mouth/throat/chest pain.  CXR CBC CMP to evaluate further.   Chronic respiratory failure with hypoxia (HCC) chronic cough, oxygen dependent, takes Prednisone 5mg  daily, DuoNeb qid and Tessalon tid.   Indeterminate pulmonary nodules CT 04/20/15 first confirmed  - MPNS apparent on plain cxr 11/09/2015  10/22/16 denied chest pain     Family/ staff Communication: plan of care reviewed with the patient and charge nurse  Labs/tests ordered:  CXR CBC CMP  Time spend 25 minutes

## 2016-10-22 NOTE — Assessment & Plan Note (Signed)
CT 04/20/15 first confirmed  - MPNS apparent on plain cxr 11/09/2015  10/22/16 denied chest pain

## 2016-10-22 NOTE — Assessment & Plan Note (Signed)
bloody sputums x 2, thick like jello consistency on different occasions in the past 2 days, Guaiac positive, amount about a table spoon, unified color in bright red but not as deep as blood. She denied mouth/throat/chest pain.  CXR CBC CMP to evaluate further.

## 2016-10-23 ENCOUNTER — Non-Acute Institutional Stay (SKILLED_NURSING_FACILITY): Payer: Medicare Other

## 2016-10-23 DIAGNOSIS — Z0000 Encounter for general adult medical examination without abnormal findings: Secondary | ICD-10-CM

## 2016-10-23 DIAGNOSIS — Z0189 Encounter for other specified special examinations: Secondary | ICD-10-CM | POA: Diagnosis not present

## 2016-10-23 LAB — BASIC METABOLIC PANEL
BUN: 19 (ref 4–21)
CREATININE: 0.5 (ref <=1.1)
Glucose: 91
Potassium: 3.8 (ref 3.4–5.3)
SODIUM: 141 (ref 137–147)

## 2016-10-23 LAB — HEPATIC FUNCTION PANEL
ALT: 13 (ref 7–35)
AST: 15 (ref 13–35)
Alkaline Phosphatase: 77 (ref 25–125)
BILIRUBIN, TOTAL: 0.3

## 2016-10-23 LAB — CBC AND DIFFERENTIAL
HEMATOCRIT: 31 — AB (ref 36–46)
Hemoglobin: 10 — AB (ref 12.0–16.0)
PLATELETS: 204 (ref 150–399)
WBC: 6.9

## 2016-10-23 NOTE — Patient Instructions (Signed)
Karina Andrews , Thank you for taking time to come for your Medicare Wellness Visit. I appreciate your ongoing commitment to your health goals. Please review the following plan we discussed and let me know if I can assist you in the future.   Screening recommendations/referrals: Colonoscopy excluded, pt over age 81 Mammogram excluded, pt over age 81 Bone Density up to date Recommended yearly ophthalmology/optometry visit for glaucoma screening and checkup Recommended yearly dental visit for hygiene and checkup  Vaccinations: Influenza vaccine due, excluded hospice pt Pneumococcal vaccine 13 due, excluded hospice pt Tdap vaccine up to date Shingles vaccine excluded hospice pt   Advanced directives: In Chart  Conditions/risks identified: None  Next appointment: Dr. Glade LloydPandey makes rounds   Preventive Care 4565 Years and Older, Female Preventive care refers to lifestyle choices and visits with your health care provider that can promote health and wellness. What does preventive care include?  A yearly physical exam. This is also called an annual well check.  Dental exams once or twice a year.  Routine eye exams. Ask your health care provider how often you should have your eyes checked.  Personal lifestyle choices, including:  Daily care of your teeth and gums.  Regular physical activity.  Eating a healthy diet.  Avoiding tobacco and drug use.  Limiting alcohol use.  Practicing safe sex.  Taking low-dose aspirin every day.  Taking vitamin and mineral supplements as recommended by your health care provider. What happens during an annual well check? The services and screenings done by your health care provider during your annual well check will depend on your age, overall health, lifestyle risk factors, and family history of disease. Counseling  Your health care provider may ask you questions about your:  Alcohol use.  Tobacco use.  Drug use.  Emotional  well-being.  Home and relationship well-being.  Sexual activity.  Eating habits.  History of falls.  Memory and ability to understand (cognition).  Work and work Astronomerenvironment.  Reproductive health. Screening  You may have the following tests or measurements:  Height, weight, and BMI.  Blood pressure.  Lipid and cholesterol levels. These may be checked every 5 years, or more frequently if you are over 35106 years old.  Skin check.  Lung cancer screening. You may have this screening every year starting at age 81 if you have a 30-pack-year history of smoking and currently smoke or have quit within the past 15 years.  Fecal occult blood test (FOBT) of the stool. You may have this test every year starting at age 81.  Flexible sigmoidoscopy or colonoscopy. You may have a sigmoidoscopy every 5 years or a colonoscopy every 10 years starting at age 10950.  Hepatitis C blood test.  Hepatitis B blood test.  Sexually transmitted disease (STD) testing.  Diabetes screening. This is done by checking your blood sugar (glucose) after you have not eaten for a while (fasting). You may have this done every 1-3 years.  Bone density scan. This is done to screen for osteoporosis. You may have this done starting at age 81.  Mammogram. This may be done every 1-2 years. Talk to your health care provider about how often you should have regular mammograms. Talk with your health care provider about your test results, treatment options, and if necessary, the need for more tests. Vaccines  Your health care provider may recommend certain vaccines, such as:  Influenza vaccine. This is recommended every year.  Tetanus, diphtheria, and acellular pertussis (Tdap, Td) vaccine. You may  need a Td booster every 10 years.  Zoster vaccine. You may need this after age 60.  Pneumococcal 13-valent conjugate (PCV13) vaccine. One dose is recommended after age 31.  Pneumococcal polysaccharide (PPSV23) vaccine. One  dose is recommended after age 33. Talk to your health care provider about which screenings and vaccines you need and how often you need them. This information is not intended to replace advice given to you by your health care provider. Make sure you discuss any questions you have with your health care provider. Document Released: 03/09/2015 Document Revised: 10/31/2015 Document Reviewed: 12/12/2014 Elsevier Interactive Patient Education  2017 [place] Prevention in the Home Falls can cause injuries. They can happen to people of all ages. There are many things you can do to make your home safe and to help prevent falls. What can I do on the outside of my home?  Regularly fix the edges of walkways and driveways and fix any cracks.  Remove anything that might make you trip as you walk through a door, such as a raised step or threshold.  Trim any bushes or trees on the path to your home.  Use bright outdoor lighting.  Clear any walking paths of anything that might make someone trip, such as rocks or tools.  Regularly check to see if handrails are loose or broken. Make sure that both sides of any steps have handrails.  Any raised decks and porches should have guardrails on the edges.  Have any leaves, snow, or ice cleared regularly.  Use sand or salt on walking paths during winter.  Clean up any spills in your garage right away. This includes oil or grease spills. What can I do in the bathroom?  Use night lights.  Install grab bars by the toilet and in the tub and shower. Do not use towel bars as grab bars.  Use non-skid mats or decals in the tub or shower.  If you need to sit down in the shower, use a plastic, non-slip stool.  Keep the floor dry. Clean up any water that spills on the floor as soon as it happens.  Remove soap buildup in the tub or shower regularly.  Attach bath mats securely with double-sided non-slip rug tape.  Do not have throw rugs and other  things on the floor that can make you trip. What can I do in the bedroom?  Use night lights.  Make sure that you have a light by your bed that is easy to reach.  Do not use any sheets or blankets that are too big for your bed. They should not hang down onto the floor.  Have a firm chair that has side arms. You can use this for support while you get dressed.  Do not have throw rugs and other things on the floor that can make you trip. What can I do in the kitchen?  Clean up any spills right away.  Avoid walking on wet floors.  Keep items that you use a lot in easy-to-reach places.  If you need to reach something above you, use a strong step stool that has a grab bar.  Keep electrical cords out of the way.  Do not use floor polish or wax that makes floors slippery. If you must use wax, use non-skid floor wax.  Do not have throw rugs and other things on the floor that can make you trip. What can I do with my stairs?  Do not leave any items on  the stairs.  Make sure that there are handrails on both sides of the stairs and use them. Fix handrails that are broken or loose. Make sure that handrails are as long as the stairways.  Check any carpeting to make sure that it is firmly attached to the stairs. Fix any carpet that is loose or worn.  Avoid having throw rugs at the top or bottom of the stairs. If you do have throw rugs, attach them to the floor with carpet tape.  Make sure that you have a light switch at the top of the stairs and the bottom of the stairs. If you do not have them, ask someone to add them for you. What else can I do to help prevent falls?  Wear shoes that:  Do not have high heels.  Have rubber bottoms.  Are comfortable and fit you well.  Are closed at the toe. Do not wear sandals.  If you use a stepladder:  Make sure that it is fully opened. Do not climb a closed stepladder.  Make sure that both sides of the stepladder are locked into place.  Ask  someone to hold it for you, if possible.  Clearly mark and make sure that you can see:  Any grab bars or handrails.  First and last steps.  Where the edge of each step is.  Use tools that help you move around (mobility aids) if they are needed. These include:  Canes.  Walkers.  Scooters.  Crutches.  Turn on the lights when you go into a dark area. Replace any light bulbs as soon as they burn out.  Set up your furniture so you have a clear path. Avoid moving your furniture around.  If any of your floors are uneven, fix them.  If there are any pets around you, be aware of where they are.  Review your medicines with your doctor. Some medicines can make you feel dizzy. This can increase your chance of falling. Ask your doctor what other things that you can do to help prevent falls. This information is not intended to replace advice given to you by your health care provider. Make sure you discuss any questions you have with your health care provider. Document Released: 12/07/2008 Document Revised: 07/19/2015 Document Reviewed: 03/17/2014 Elsevier Interactive Patient Education  2017 [employer].

## 2016-10-23 NOTE — Progress Notes (Signed)
Subjective:   Karina Andrews is a 83 y.o. female who presents for an Initial Medicare Annual Wellness Visit at [facility]; incapacitated patient unable to answer questions appropriately, hospice patient        Objective:    Today's Vitals   10/23/16 1417  BP: 132/68  Pulse: 99  Temp: (!) 97.5 F (36.4 C)  TempSrc: Oral  SpO2: 93%  Weight: 129 lb (58.5 kg)  Height: 5\' 8"  (1.727 m)   Body mass index is 19.61 kg/m.   Current Medications (verified) Outpatient Encounter Prescriptions as of 10/23/2016  Medication Sig  . acetaminophen (TYLENOL) 500 MG tablet Take 1,000 mg by mouth 3 (three) times daily as needed.   . ALPRAZolam (XANAX) 0.5 MG tablet Take 0.5 mg by mouth 4 (four) times daily.   . benzonatate (TESSALON) 100 MG capsule Take 1 capsule by mouth 3 (three) times daily. Give at 8 am, 2 pm and 8 pm  . furosemide (LASIX) 20 MG tablet Take 20 mg by mouth daily.  Marland Kitchen guaifenesin (ROBITUSSIN) 100 MG/5ML syrup Take by mouth. Take 10 ml every 4 hours as needed for cough  . ipratropium-albuterol (DUONEB) 0.5-2.5 (3) MG/3ML SOLN Take 3 mLs by nebulization. Use nebulizer four times daily  . loperamide (IMODIUM) 2 MG capsule 1 tab po bid prn diarrhea  . Melatonin 5 MG TABS Take 5 mg by mouth at bedtime.   . OXYGEN Inhale 2 L/min into the lungs continuous.  . potassium chloride (K-DUR) 10 MEQ tablet Take 20 mEq by mouth daily.  . predniSONE (DELTASONE) 5 MG tablet Take 5 mg by mouth daily with breakfast.   . sertraline (ZOLOFT) 25 MG tablet Take 25 mg by mouth at bedtime.   No facility-administered encounter medications on file as of 10/23/2016.     Allergies (verified) Ativan [lorazepam]   History: Past Medical History:  Diagnosis Date  . Alcohol abuse   . Anemia   . Anxiety   . Aortic stenosis    moderate on echo 09/2013  . Cervical spondylosis with myelopathy 01/2009   s/p decompression  . COPD (chronic obstructive pulmonary disease) (HCC)     . Dementia 09/06/2015  . Depression   . Fall at home 05/2015  . Heart murmur   . History of blood transfusion 01/02/2016   "anemia"  . Hypertension   . On home oxygen therapy    "2L; 24/7" (01/02/2016)  . Osteoporosis    (R) hip fx 11/2009 & (L) hip fx 2006  . Speech impediment    present for >9 months; no one has really been able to discern an etiology/daughter/notes 01/01/2016   Past Surgical History:  Procedure Laterality Date  . BACK SURGERY    . BLADDER SUSPENSION     "unsuccessful"  . CATARACT EXTRACTION W/ INTRAOCULAR LENS  IMPLANT, BILATERAL Bilateral 06/06/13 - 2016   "right - left"  . FRACTURE SURGERY    . HIP FRACTURE SURGERY Left 1980s  . LAPAROSCOPIC CHOLECYSTECTOMY  1995  . ORIF HIP FRACTURE Right 11/2009   Done by Dr. Turner Daniels  . POSTERIOR LAMINECTOMY / DECOMPRESSION CERVICAL SPINE  01/2009   Done by Dr. Danielle Dess  . WRIST FRACTURE SURGERY Bilateral    "one in [place]; one [place]"   Family History  Problem Relation Age of Onset  . Heart attack Father    Social History   Occupational History  . Retired-travel agent    Social History Main Topics  . Smoking  status: Former Smoker    Packs/day: 0.50    Years: 63.00    Types: Cigarettes    Quit date: 09/10/2015  . Smokeless tobacco: Never Used  . Alcohol use 0.0 oz/week     Comment: 01/02/2016 "last drink was before 09/10/2015  . Drug use: No  . Sexual activity: No    Tobacco Counseling Counseling given: Not Answered   Activities of Daily Living In your present state of health, do you have any difficulty performing the following activities: 10/23/2016 03/08/2016  Hearing? Malvin JohnsY Y  Vision? N N  Difficulty concentrating or making decisions? Malvin JohnsY Y  Walking or climbing stairs? Y Y  Dressing or bathing? Y Y  Doing errands, shopping? Y Y  Comment - -  Preparing Food and eating ? Y -  Using the Toilet? Y -  In the past six months, have you accidently leaked urine? Y -  Do you have problems with loss of bowel control?  Y -  Managing your Medications? Y -  Managing your Finances? Y -  Housekeeping or managing your Housekeeping? Y -  Some recent data might be hidden    Immunizations and Health Maintenance Immunization History  Administered Date(s) Administered  . Influenza Split 11/03/2011, 11/17/2013, 11/25/2014  . Influenza-Unspecified 12/12/2015, 12/19/2015  . PPD Test 06/06/2015, 06/20/2015  . Pneumococcal Polysaccharide-23 09/17/2013  . Tdap 07/04/2016   There are no preventive care reminders to display for this patient.  Patient Care Team: Oneal GroutPandey, Mahima, MD as PCP - General (Internal Medicine) Clance, Maree KrabbeKeith M, MD as Consulting Physician (Pulmonary Disease) Gean Birchwoodowan, Frank, MD as Consulting Physician (Orthopedic Surgery) Barnett AbuElsner, Henry, MD as Consulting Physician (Neurosurgery) Mast, Man X, NP as Nurse Practitioner (Internal Medicine)  Indicate any recent Medical Services you may have received from other than [facility] providers in the past year (date may be approximate).     Assessment:   This is a routine wellness examination for Karina Andrews.   Hearing/Vision screen No exam data present  Dietary issues and exercise activities discussed: Current Exercise Habits: The patient does not participate in regular exercise at present, Exercise limited by: orthopedic condition(s)  Goals    None     Depression Screen PHQ 2/9 Scores 10/23/2016 04/11/2015  PHQ - 2 Score 0 0    Fall Risk Fall Risk  10/23/2016 04/11/2015  Falls in the past year? Yes No  Number falls in past yr: 2 or more -  Injury with Fall? Yes -    Cognitive Function: MMSE - Mini Mental State Exam 10/23/2016  Not completed: Unable to complete        Screening Tests Health Maintenance  Topic Date Due  . INFLUENZA VACCINE  11/24/2016 (Originally 09/24/2016)  . PNA vac Low Risk Adult (2 of 2 - PCV13) 02/24/2017 (Originally 09/18/2014)  . TETANUS/TDAP  07/05/2026  . DEXA SCAN  Completed      Plan:    I have personally reviewed  and addressed the Medicare Annual Wellness questionnaire and have noted the following in the patient's chart:  A. Medical and social history B. Use of alcohol, tobacco or illicit drugs  C. Current medications and supplements D. Functional ability and status E.  Nutritional status F.  Physical activity G. Advance directives H. List of other physicians I.  Hospitalizations, surgeries, and ER visits in previous 12 months J.  Vitals K. Screenings to include hearing, vision, cognitive, depression L. Referrals and appointments - none  In addition, I am unable to review and discuss with  incapacitated patient certain preventive protocols, quality metrics, and best practice recommendations. A written personalized care plan for preventive services as well as general preventive health recommendations were provided to patient.   See attached scanned questionnaire for additional information.   Signed,   Annetta Maw, RN Nurse Health Advisor   Quick Notes   Health Maintenance: PNA 13 due, not ordered-hospice patient     Abnormal Screen: Unable to complete mental exam     Patient Concerns: none     Nurse Concerns: none

## 2016-10-24 ENCOUNTER — Encounter: Payer: Self-pay | Admitting: Nurse Practitioner

## 2016-10-24 ENCOUNTER — Non-Acute Institutional Stay (SKILLED_NURSING_FACILITY): Payer: Medicare Other | Admitting: Nurse Practitioner

## 2016-10-24 ENCOUNTER — Other Ambulatory Visit: Payer: Self-pay | Admitting: *Deleted

## 2016-10-24 DIAGNOSIS — R918 Other nonspecific abnormal finding of lung field: Secondary | ICD-10-CM | POA: Diagnosis not present

## 2016-10-24 DIAGNOSIS — R042 Hemoptysis: Secondary | ICD-10-CM

## 2016-10-24 NOTE — Assessment & Plan Note (Addendum)
10/22/16 CXR patchy left upper lung opacity. 10/23/16 Na 141, K 3.8, Bun 19, creat 0.54, wbc 6.9, Hgb 10.0, plt 204 10/24/16 POA desires no further workup and treatment, continue Hospice service for comfort measures.

## 2016-10-24 NOTE — Assessment & Plan Note (Addendum)
No further occurrence, etiologies: chronic bronchitis/COPD, infectious process, or malignancy changes. POA desires no further workup and treatment. Observe.

## 2016-10-24 NOTE — Progress Notes (Signed)
Location:   [facility] Room Number: 7531 Place of Service:  SNF (31) Provider: Arna SnipeManXie Everli Rother NP  Oneal GroutPandey, Mahima, MD  Patient Care Team: Oneal GroutPandey, Mahima, MD as PCP - General (Internal Medicine) Clance, Maree KrabbeKeith M, MD as Consulting Physician (Pulmonary Disease) Gean Birchwoodowan, Frank, MD as Consulting Physician (Orthopedic Surgery) Barnett AbuElsner, Henry, MD as Consulting Physician (Neurosurgery) Lisseth Brazeau X, NP as Nurse Practitioner (Internal Medicine)  Extended Emergency Contact Information Primary Emergency Contact: Sandi Mariscaluellar,Kristina Address: [address]          [place], [place] Mobile Phone: [phone] Relation: Daughter Secondary Emergency Contact: Royston Cowperuellar,Oscar  [place] Mobile Phone: [phone] Relation: Relative  Code Status: DNR Goals of care: Advanced Directive information Advanced Directives 10/24/2016  Does Patient Have a Medical Advance Directive? Yes  Type of Advance Directive Living will;Healthcare Power of [place];Out of facility DNR (pink MOST or yellow form)  Does patient want to make changes to medical advance directive? No - Patient declined  Copy of Healthcare Power of Attorney in Chart? Yes  Would patient like information on creating a medical advance directive? -  Pre-existing out of facility DNR order (yellow form or pink MOST form) Yellow form placed in chart (order not valid for inpatient use)     Chief Complaint  Patient presents with  . Acute Visit    HPI:  Pt is a 83 y.o. female seen today for an acute visit for abnormal chest x-ray finding 10/22/16 when CXR obtained to evaluate the patient coughed up blood x2. CXR showed patchy left upper lung opacity. Hx of right upper lung nodule densities x2 on CT scan 04/20/15. The patient has no fever, chest pain, or further hemoptysis. The patient's POA desires no further work up or treatment for above mentioned lung issues, continuing comfort measures with Hospice service.     Past Medical History:  Diagnosis Date  . Alcohol abuse   . Anemia   . Anxiety   . Aortic stenosis    moderate on echo 09/2013  . Cervical spondylosis with myelopathy 01/2009   s/p decompression  . COPD (chronic obstructive pulmonary disease) (HCC)   . Dementia 09/06/2015  . Depression   . Fall at home 05/2015  . Heart murmur   . History of blood transfusion 01/02/2016   "anemia"  . Hypertension   . On home oxygen therapy    "2L; 24/7" (01/02/2016)  . Osteoporosis    (R) hip fx 11/2009 & (L) hip fx 2006  . Speech impediment    present for >9 months; no one has really been able to discern an etiology/daughter/notes 01/01/2016   Past Surgical History:  Procedure Laterality Date  . BACK SURGERY    . BLADDER SUSPENSION     "unsuccessful"  . CATARACT EXTRACTION W/ INTRAOCULAR LENS  IMPLANT, BILATERAL Bilateral 06/06/13 - 2016   "right - left"  . FRACTURE SURGERY    . HIP FRACTURE SURGERY Left 1980s  . LAPAROSCOPIC CHOLECYSTECTOMY  1995  . ORIF HIP FRACTURE Right 11/2009   Done by Dr. Turner Danielsowan  . POSTERIOR LAMINECTOMY / DECOMPRESSION CERVICAL SPINE  01/2009   Done by Dr. Danielle DessElsner  . WRIST FRACTURE SURGERY [place]    [place]"    Allergies  Allergen Reactions  . Ativan [Lorazepam] Other (See Comments)    Causes increased confusion/AMS    Allergies as of 10/24/2016      Reactions   Ativan [lorazepam] Other (See Comments)   Causes  increased confusion/AMS      Medication List       Accurate as of 10/24/16  3:38 PM. Always use your most recent med list.          acetaminophen 500 MG tablet Commonly known as:  TYLENOL Take 1,000 mg by mouth 3 (three) times daily as needed.   ALPRAZolam 0.5 MG tablet Commonly known as:  XANAX Take 0.5 mg by mouth 4 (four) times daily.   benzonatate 100 MG capsule Commonly known as:  TESSALON Take 1 capsule by mouth 3 (three) times daily. Give at 8 am, 2 pm and 8 pm   furosemide 20 MG tablet Commonly known as:   LASIX Take 20 mg by mouth daily.   guaifenesin 100 MG/5ML syrup Commonly known as:  ROBITUSSIN Take by mouth. Take 10 ml every 4 hours as needed for cough   ipratropium-albuterol 0.5-2.5 (3) MG/3ML Soln Commonly known as:  DUONEB Take 3 mLs by nebulization. Use nebulizer four times daily   loperamide 2 MG capsule Commonly known as:  IMODIUM 1 tab po bid prn diarrhea   Melatonin 5 MG Tabs Take 5 mg by mouth at bedtime.   OXYGEN Inhale 2 L/min into the lungs continuous.   potassium chloride 10 MEQ tablet Commonly known as:  K-DUR Take 20 mEq by mouth daily.   predniSONE 5 MG tablet Commonly known as:  DELTASONE Take 5 mg by mouth daily with breakfast.   sertraline 25 MG tablet Commonly known as:  ZOLOFT Take 25 mg by mouth at bedtime.       Review of Systems  Constitutional: Negative for activity change, appetite change, chills, diaphoresis, fatigue and fever.  Respiratory: Positive for cough and shortness of breath. Negative for choking, chest tightness and wheezing.        Chronic  Cardiovascular: Negative for chest pain, palpitations and leg swelling.  Psychiatric/Behavioral: Positive for confusion. Negative for agitation and behavioral problems. The patient is nervous/anxious.     Immunization History  Administered Date(s) Administered  . Influenza Split 11/03/2011, 11/17/2013, 11/25/2014  . Influenza-Unspecified 12/12/2015, 12/19/2015  . PPD Test 06/06/2015, 06/20/2015  . Pneumococcal Polysaccharide-23 09/17/2013  . Tdap 07/04/2016   Pertinent  Health Maintenance Due  Topic Date Due  . INFLUENZA VACCINE  11/24/2016 (Originally 09/24/2016)  . PNA vac Low Risk Adult (2 of 2 - PCV13) 02/24/2017 (Originally 09/18/2014)  . DEXA SCAN  Completed   Fall Risk  10/23/2016 04/11/2015  Falls in the past year? Yes No  Number falls in past yr: 2 or more -  Injury with Fall? Yes -   Functional Status Survey:    Vitals:   10/24/16 1424  BP: 131/76  Pulse: 94  Resp:  18  Temp: 98.4 F (36.9 C)  SpO2: 93%  Weight: 129 lb (58.5 kg)  Height: 5\' 8"  (1.727 m)   Body mass index is 19.61 kg/m. Physical Exam  Constitutional: She appears well-developed and well-nourished. No distress.  Cardiovascular: Normal rate, regular rhythm and normal heart sounds.   No murmur heard. Pulmonary/Chest: Effort normal. She has no wheezes. She has no rales.  Decreased air entry  Neurological: She is alert.  Oriented to person and place.   Skin: She is not diaphoretic.    Labs reviewed:  Recent Labs  03/07/16 1611 03/07/16 2247 03/08/16 0512  07/08/16 07/14/16 10/23/16  NA 142 139 139  < > 142 143 141  K 4.8 4.1 3.8  < > 3.1* 3.5 3.8  CL 108  105 106  --   --   --   --   CO2 28 24 26   --   --   --   --   GLUCOSE 113* 109* 101*  --   --   --   --   BUN 22 20 16   < > 16 12 19   CREATININE 0.63 0.70 0.67  < > 0.5 0.5 0.5  CALCIUM 8.8 9.0 8.6*  --   --   --   --   < > = values in this interval not displayed.  Recent Labs  01/01/16 1446 03/07/16 2247 03/08/16 0512 05/29/16 07/08/16 10/23/16  AST 16 38 39 21 25 15   ALT 11 61* 57* 21 24 13   ALKPHOS 88 127* 119 84 89 77  BILITOT 0.2 0.7 0.5  --   --   --   PROT 7.0 6.5 5.9*  --   --   --   ALBUMIN 4.0 3.1* 2.7*  --   --   --     Recent Labs  03/07/16 1611  03/09/16 0602 03/28/16 1715 04/11/16 1506  09/02/16 10/15/16 10/23/16  WBC 8.8  < > 7.5 6.5 5.9  < > 6.3 8.3 6.9  NEUTROABS 7.2  --   --  4.1 4.0  --   --   --   --   HGB 5.9 Repeated and verified X2.*  < > 8.1* 12.0 12.4  < > 3.7* 10.0* 10.0*  HCT 18.6 Repeated and verified X2.*  < > 25.6* 38.0 38.2  < > 10* 32* 31*  MCV 82.6  < > 85.0 85.1 85.1  --   --   --   --   PLT 343.0  < > 249 191.0 153.0  < > 187 200 204  < > = values in this interval not displayed. Lab Results  Component Value Date   TSH 2.01 01/01/2016   No results found for: HGBA1C Lab Results  Component Value Date   CHOL 260 (H) 04/11/2015   HDL 92.50 04/11/2015   LDLCALC 148  (H) 04/11/2015   LDLDIRECT 189.2 12/12/2008   TRIG 98.0 04/11/2015   CHOLHDL 3 04/11/2015    Significant Diagnostic Results in last 30 days:  No results found.  Assessment/Plan: Opacity of lung on imaging study 10/22/16 CXR patchy left upper lung opacity. 10/23/16 Na 141, K 3.8, Bun 19, creat 0.54, wbc 6.9, Hgb 10.0, plt 204 10/24/16 POA desires no further workup and treatment, continue Hospice service for comfort measures.     Cough with hemoptysis No further occurrence, etiologies: chronic bronchitis/COPD, infectious process, or malignancy changes. POA desires no further workup and treatment. Observe.     Family/ staff Communication: plan of care reviewed with the patient, Hospice nurse, and charge nurse.   Time spend: 25 minutes.   Labs/tests ordered:  none

## 2016-10-28 DIAGNOSIS — D649 Anemia, unspecified: Secondary | ICD-10-CM | POA: Diagnosis not present

## 2016-10-28 LAB — CBC AND DIFFERENTIAL
HEMATOCRIT: 32 — AB (ref 36–46)
HEMATOCRIT: 32 — AB (ref 36–46)
HEMOGLOBIN: 10.2 — AB (ref 12.0–16.0)
Hemoglobin: 10.2 — AB (ref 12.0–16.0)
PLATELETS: 212 (ref 150–399)
Platelets: 212 (ref 150–399)
WBC: 9
WBC: 9

## 2016-10-29 ENCOUNTER — Other Ambulatory Visit: Payer: Self-pay | Admitting: *Deleted

## 2016-11-03 ENCOUNTER — Encounter: Payer: Self-pay | Admitting: Nurse Practitioner

## 2016-11-03 ENCOUNTER — Non-Acute Institutional Stay (SKILLED_NURSING_FACILITY): Payer: Medicare Other | Admitting: Nurse Practitioner

## 2016-11-03 DIAGNOSIS — S0083XA Contusion of other part of head, initial encounter: Secondary | ICD-10-CM | POA: Insufficient documentation

## 2016-11-03 DIAGNOSIS — R296 Repeated falls: Secondary | ICD-10-CM | POA: Diagnosis not present

## 2016-11-03 DIAGNOSIS — R269 Unspecified abnormalities of gait and mobility: Secondary | ICD-10-CM

## 2016-11-03 NOTE — Assessment & Plan Note (Signed)
fall in bathroom 11/01/16. The patient was found lying on floor in her bathroom when a staff went to answer the resident's call light. No focal neurological symptoms noted since the fall. Close supervision for safety in SNF

## 2016-11-03 NOTE — Assessment & Plan Note (Signed)
The patient has gait abnormality, assist x1 for transfer, w/c for mobility.

## 2016-11-03 NOTE — Assessment & Plan Note (Signed)
the left peri-orbital contusion, left knee abrasion sustained from the fall in bathroom 11/01/16. The patient was found lying on floor in her bathroom when a staff went to answer the resident's call light. No focal neurological symptoms noted since the fall. She denied dizziness, chest pain, palpitation, or limb weakness associated with the fall.  It should heal. Continue supervision for safety.

## 2016-11-03 NOTE — Progress Notes (Signed)
Location:  [facility] Room Number: 31 Place of Service:  SNF (31) Provider:  Mast, Manxie  NP  Oneal Grout, MD  Patient Care Team: Oneal Grout, MD as PCP - General (Internal Medicine) Clance, Maree Krabbe, MD as Consulting Physician (Pulmonary Disease) Gean Birchwood, MD as Consulting Physician (Orthopedic Surgery) Barnett Abu, MD as Consulting Physician (Neurosurgery) Mast, Man X, NP as Nurse Practitioner (Internal Medicine)  Extended Emergency Contact Information Primary Emergency Contact: Sandi Mariscal Address: [address]          [place], [place] Phone: [phone] Relation: Daughter Secondary Emergency Contact: Royston Cowper [place] Mobile Phone: [phone] Relation: Relative  Code Status:  DNR Goals of care: Advanced Directive information Advanced Directives 11/03/2016  Does Patient Have a Medical Advance Directive? Yes  Type of Advance Directive Living will;Healthcare Power of [place];Out of facility DNR (pink MOST or yellow form)  Does patient want to make changes to medical advance directive? No - Patient declined  Copy of Healthcare Power of Attorney in Chart? Yes  Would patient like information on creating a medical advance directive? -  Pre-existing out of facility DNR order (yellow form or pink MOST form) Yellow form placed in chart (order not valid for inpatient use)     Chief Complaint  Patient presents with  . Acute Visit    Fell on 11/01/16, hematoma to (L) forehead.    HPI:  Pt is a 83 y.o. female seen today for an acute visit for the left peri-orbital contusion, left knee abrasion sustained from the fall in bathroom 11/01/16. The patient was found lying on floor in her bathroom when a staff went to answer the resident's call light. No focal neurological symptoms noted since the fall. She denied dizziness, chest pain, palpitation, or limb weakness associated with the  fall.    Hx of gait instability, she needs one person assistance for transfer, she falls frequently in the past.  Past Medical History:  Diagnosis Date  . Alcohol abuse   . Anemia   . Anxiety   . Aortic stenosis    moderate on echo 09/2013  . Cervical spondylosis with myelopathy 01/2009   s/p decompression  . COPD (chronic obstructive pulmonary disease) (HCC)   . Dementia 09/06/2015  . Depression   . Fall at home 05/2015  . Heart murmur   . History of blood transfusion 01/02/2016   "anemia"  . Hypertension   . On home oxygen therapy    "2L; 24/7" (01/02/2016)  . Osteoporosis    (R) hip fx 11/2009 & (L) hip fx 2006  . Speech impediment    present for >9 months; no one has really been able to discern an etiology/daughter/notes 01/01/2016   Past Surgical History:  Procedure Laterality Date  . BACK SURGERY    . BLADDER SUSPENSION     "unsuccessful"  . CATARACT EXTRACTION W/ INTRAOCULAR LENS  IMPLANT, BILATERAL Bilateral 06/06/13 - 2016   "right - left"  . FRACTURE SURGERY    . HIP FRACTURE SURGERY Left 1980s  . LAPAROSCOPIC CHOLECYSTECTOMY  1995  . ORIF HIP FRACTURE Right 11/2009   Done by Dr. Turner Daniels  . POSTERIOR LAMINECTOMY / DECOMPRESSION CERVICAL SPINE  01/2009   Done by Dr. Danielle Dess  . WRIST FRACTURE SURGERY Bilateral    "one in [place]; one [place]"    Allergies  Allergen Reactions  . Ativan [Lorazepam] Other (See Comments)    Causes increased confusion/AMS  Outpatient Encounter Prescriptions as of 11/03/2016  Medication Sig  . acetaminophen (TYLENOL) 500 MG tablet Take 1,000 mg by mouth 3 (three) times daily as needed.   . ALPRAZolam (XANAX) 0.5 MG tablet Take 0.5 mg by mouth 4 (four) times daily.   . benzonatate (TESSALON) 100 MG capsule Take 1 capsule by mouth 3 (three) times daily. Give at 8 am, 2 pm and 8 pm  . furosemide (LASIX) 20 MG tablet Take 20 mg by mouth daily.  Marland Kitchen guaifenesin (ROBITUSSIN) 100 MG/5ML syrup Take by mouth. Take 10 ml every 4 hours as needed  for cough  . ipratropium-albuterol (DUONEB) 0.5-2.5 (3) MG/3ML SOLN Take 3 mLs by nebulization. Use nebulizer four times daily  . loperamide (IMODIUM) 2 MG capsule 1 tab po bid prn diarrhea  . Melatonin 5 MG TABS Take 5 mg by mouth at bedtime.   . OXYGEN Inhale 2 L/min into the lungs continuous.  . potassium chloride (K-DUR) 10 MEQ tablet Take 20 mEq by mouth daily.  . predniSONE (DELTASONE) 5 MG tablet Take 5 mg by mouth daily with breakfast.   . sertraline (ZOLOFT) 25 MG tablet Take 25 mg by mouth at bedtime.   No facility-administered encounter medications on file as of 11/03/2016.    ROS was provided with assistance of staff due to her dysarthria.  Review of Systems  Constitutional: Negative for activity change, appetite change, chills, diaphoresis, fatigue and fever.  HENT: Positive for facial swelling and hearing loss. Negative for congestion.   Eyes: Negative for photophobia, pain, redness, itching and visual disturbance.  Respiratory: Positive for cough and shortness of breath. Negative for wheezing.   Cardiovascular: Negative for chest pain, palpitations and leg swelling.  Musculoskeletal: Positive for gait problem. Negative for joint swelling and neck stiffness.  Skin: Positive for wound.       Left peri orbital contusion. Left knee abrasion.   Neurological: Positive for speech difficulty. Negative for dizziness, tremors, seizures and headaches.  Psychiatric/Behavioral: Negative for agitation, behavioral problems, confusion, hallucinations and sleep disturbance. The patient is nervous/anxious.     Immunization History  Administered Date(s) Administered  . Influenza Split 11/03/2011, 11/17/2013, 11/25/2014  . Influenza-Unspecified 12/12/2015, 12/19/2015  . PPD Test 06/06/2015, 06/20/2015  . Pneumococcal Polysaccharide-23 09/17/2013  . Tdap 07/04/2016   Pertinent  Health Maintenance Due  Topic Date Due  . INFLUENZA VACCINE  11/24/2016 (Originally 09/24/2016)  . PNA vac Low  Risk Adult (2 of 2 - PCV13) 02/24/2017 (Originally 09/18/2014)  . DEXA SCAN  Completed   Fall Risk  10/23/2016 04/11/2015  Falls in the past year? Yes No  Number falls in past yr: 2 or more -  Injury with Fall? Yes -   Functional Status Survey:    Vitals:   11/03/16 1333  BP: (!) 150/80  Pulse: 86  Resp: 20  Temp: 98.7 F (37.1 C)  SpO2: 93%  Weight: 129 lb 6.4 oz (58.7 kg)  Height:  (1.727 m)   Body mass index is 19.68 kg/m. Physical Exam  Constitutional: She is oriented to person, place, and time. She appears well-developed and well-nourished.  HENT:  Head: Normocephalic and atraumatic.  Right Ear: External ear normal.  Left Ear: External ear normal.  Nose: Nose normal.  Mouth/Throat: Oropharynx is clear and moist. No oropharyngeal exudate.  Eyes: Pupils are equal, round, and reactive to light. Conjunctivae and EOM are normal. Right eye exhibits no discharge. Left eye exhibits no discharge. No scleral icterus.  Neck: Normal range of motion.  Neck supple. No JVD present.  Cardiovascular: Normal rate, regular rhythm and normal heart sounds.   No murmur heard. Pulmonary/Chest:  O2 dependent, decreased air entry.  Musculoskeletal: Normal range of motion. She exhibits no edema or tenderness.  Neurological: She is alert and oriented to person, place, and time. No cranial nerve deficit. She exhibits normal muscle tone. Coordination normal.  Skin: Skin is warm and dry.  Left peri orbital contusion/ecchymosis, small abrasion left knee.   Psychiatric: She has a normal mood and affect. Her behavior is normal. Thought content normal.    Labs reviewed:  Recent Labs  03/07/16 1611 03/07/16 2247 03/08/16 0512  07/08/16 07/14/16 10/23/16  NA 142 139 139  < > 142 143 141  K 4.8 4.1 3.8  < > 3.1* 3.5 3.8  CL 108 105 106  --   --   --   --   CO2 --   --   --   --   GLUCOSE 113* 109* 101*  --   --   --   --   BUN < > CREATININE 0.63 0.70 0.67  <  > 0.5 0.5 0.5  CALCIUM 8.8 9.0 8.6*  --   --   --   --   < > = values in this interval not displayed.  Recent Labs  01/01/16 1446 03/07/16 2247 03/08/16 0512 05/29/16 07/08/16 10/23/16  AST 16 38 39 ALT 11 61* 57* ALKPHOS 88 127* 119 84 89 77  BILITOT 0.2 0.7 0.5  --   --   --   PROT 7.0 6.5 5.9*  --   --   --   ALBUMIN 4.0 3.1* 2.7*  --   --   --     Recent Labs  03/07/16 1611  03/09/16 0602 03/28/16 1715 04/11/16 1506  10/15/16 10/23/16 10/28/16  WBC 8.8  < > 7.5 6.5 5.9  < > 8.3 6.9 9.0  NEUTROABS 7.2  --   --  4.1 4.0  --   --   --   --   HGB 5.9 Repeated and verified X2.*  < > 8.1* 12.0 12.4  < > 10.0* 10.0* 10.2*  HCT 18.6 Repeated and verified X2.*  < > 25.6* 38.0 38.2  < > 32* 31* 32*  MCV 82.6  < > 85.0 85.1 85.1  --   --   --   --   PLT 343.0  < > 249 191.0 153.0  < > 200 204 212  < > = values in this interval not displayed. Lab Results  Component Value Date   TSH 2.01 01/01/2016   No results found for: HGBA1C Lab Results  Component Value Date   CHOL 260 (H) 04/11/2015   HDL 92.50 04/11/2015   LDLCALC 148 (H) 04/11/2015   LDLDIRECT 189.2 12/12/2008   TRIG 98.0 04/11/2015   CHOLHDL 3 04/11/2015    Significant Diagnostic Results in last 30 days:  No results found.  Assessment/Plan Facial contusion, initial encounter  the left peri-orbital contusion, left knee abrasion sustained from the fall in bathroom 11/01/16. The patient was found lying on floor in her bathroom when a staff went to answer the resident's call light. No focal neurological symptoms noted since the fall. She denied dizziness, chest pain, palpitation, or limb weakness associated with the fall.  It should heal. Continue supervision for safety.   Falls  frequently fall in bathroom 11/01/16. The patient was found lying on floor in her bathroom when a staff went to answer the resident's call light. No focal neurological symptoms noted since the fall. Close supervision for safety  in SNF  Gait disorder The patient has gait abnormality, assist x1 for transfer, w/c for mobility.      Family/ staff Communication: plan of care reviewed with the patient and charge nurse.   Labs/tests ordered:  none  Time spend 25 minutes.

## 2016-11-21 ENCOUNTER — Non-Acute Institutional Stay (SKILLED_NURSING_FACILITY): Payer: Medicare Other | Admitting: Internal Medicine

## 2016-11-21 ENCOUNTER — Encounter: Payer: Self-pay | Admitting: Internal Medicine

## 2016-11-21 DIAGNOSIS — G4709 Other insomnia: Secondary | ICD-10-CM | POA: Diagnosis not present

## 2016-11-21 DIAGNOSIS — K529 Noninfective gastroenteritis and colitis, unspecified: Secondary | ICD-10-CM

## 2016-11-21 DIAGNOSIS — J449 Chronic obstructive pulmonary disease, unspecified: Secondary | ICD-10-CM | POA: Diagnosis not present

## 2016-11-21 DIAGNOSIS — F418 Other specified anxiety disorders: Secondary | ICD-10-CM

## 2016-11-21 DIAGNOSIS — I10 Essential (primary) hypertension: Secondary | ICD-10-CM | POA: Diagnosis not present

## 2016-11-21 NOTE — Progress Notes (Signed)
Location:  [facility] Room Number: 31 Place of Service:  SNF [phone]) Provider:  Oneal Grout MD  Oneal Grout, MD  Patient Care Team: Oneal Grout, MD as PCP - General (Internal Medicine) Clance, Maree Krabbe, MD as Consulting Physician (Pulmonary Disease) Gean Birchwood, MD as Consulting Physician (Orthopedic Surgery) Barnett Abu, MD as Consulting Physician (Neurosurgery) Mast, Man X, NP as Nurse Practitioner (Internal Medicine)  Extended Emergency Contact Information Primary Emergency Contact: Sandi Mariscal Address: [address]          [place], [place] Phone: [phone] Relation: Daughter Secondary Emergency Contact: Royston Cowper [place] Mobile Phone: [phone] Relation: Relative  Code Status:  DNR  Goals of care: Advanced Directive information Advanced Directives 11/21/2016  Does Patient Have a Medical Advance Directive? Yes  Type of Estate agent of [place];Living will;Out of facility DNR (pink MOST or yellow form)  Does patient want to make changes to medical advance directive? No - Patient declined  Copy of Healthcare Power of Attorney in Chart? Yes  Would patient like information on creating a medical advance directive? -  Pre-existing out of facility DNR order (yellow form or pink MOST form) Yellow form placed in chart (order not valid for inpatient use)     Chief Complaint  Patient presents with  . Medical Management of Chronic Issues    Routine Visit     HPI:  Patient is a 83 y.o. female seen today for medical management of chronic diseases. She denies any concern. she needs assistance with transfer. She is under hospice service. She is on oxygen by nasal canula and her breathing appears stable. She has dysarthria and this limits her HPI and ROS some. She feeds herself.    Past Medical History:  Diagnosis Date  . Alcohol abuse   . Anemia   .  Anxiety   . Aortic stenosis    moderate on echo 09/2013  . Cervical spondylosis with myelopathy 01/2009   s/p decompression  . COPD (chronic obstructive pulmonary disease) (HCC)   . Dementia 09/06/2015  . Depression   . Fall at home 05/2015  . Heart murmur   . History of blood transfusion 01/02/2016   "anemia"  . Hypertension   . On home oxygen therapy    "2L; 24/7" (01/02/2016)  . Osteoporosis    (R) hip fx 11/2009 & (L) hip fx 2006  . Speech impediment    present for >9 months; no one has really been able to discern an etiology/daughter/notes 01/01/2016   Past Surgical History:  Procedure Laterality Date  . BACK SURGERY    . BLADDER SUSPENSION     "unsuccessful"  . CATARACT EXTRACTION W/ INTRAOCULAR LENS  IMPLANT, BILATERAL Bilateral 06/06/13 - 2016   "right - left"  . FRACTURE SURGERY    . HIP FRACTURE SURGERY Left 1980s  . LAPAROSCOPIC CHOLECYSTECTOMY  1995  . ORIF HIP FRACTURE Right 11/2009   Done by Dr. Turner Daniels  . POSTERIOR LAMINECTOMY / DECOMPRESSION CERVICAL SPINE  01/2009   Done by Dr. Danielle Dess  . WRIST FRACTURE SURGERY Bilateral    "one in [place]; one [place]"    Allergies  Allergen Reactions  . Ativan [Lorazepam] Other (See Comments)    Causes increased confusion/AMS    Outpatient Encounter Prescriptions as of 11/21/2016  Medication Sig  . acetaminophen (TYLENOL) 500 MG tablet Take 1,000 mg by mouth 3 (three) times daily as needed.   . ALPRAZolam (  XANAX) 0.5 MG tablet Take 0.5 mg by mouth 4 (four) times daily.   . benzonatate (TESSALON) 100 MG capsule Take 1 capsule by mouth 3 (three) times daily. Give at 8 am, 2 pm and 8 pm  . furosemide (LASIX) 20 MG tablet Take 20 mg by mouth daily.  Marland Kitchen guaifenesin (ROBITUSSIN) 100 MG/5ML syrup Take by mouth. Take 10 ml every 4 hours as needed for cough  . ipratropium-albuterol (DUONEB) 0.5-2.5 (3) MG/3ML SOLN Take 3 mLs by nebulization. Use nebulizer four times daily  . loperamide (IMODIUM) 2 MG capsule 1 tab po bid prn diarrhea    . Melatonin 5 MG TABS Take 5 mg by mouth at bedtime.   . OXYGEN Inhale 2 L/min into the lungs continuous.  . potassium chloride (K-DUR) 10 MEQ tablet Take 20 mEq by mouth daily.  . predniSONE (DELTASONE) 5 MG tablet Take 5 mg by mouth daily with breakfast.   . sertraline (ZOLOFT) 25 MG tablet Take 25 mg by mouth at bedtime.   No facility-administered encounter medications on file as of 11/21/2016.     Review of Systems  Constitutional: Negative for chills, diaphoresis and fever.  HENT: Negative for congestion, mouth sores, sinus pressure, sore throat and trouble swallowing.   Respiratory: Positive for shortness of breath. Negative for cough and wheezing.   Cardiovascular: Negative for chest pain and palpitations.  Gastrointestinal: Positive for diarrhea. Negative for abdominal pain, constipation, nausea and vomiting.  Genitourinary: Negative for dysuria.  Musculoskeletal: Positive for gait problem.       On wheelchair, needs assistance with transfer  Skin: Negative for wound.  Neurological: Positive for weakness. Negative for dizziness.  Psychiatric/Behavioral: Negative for behavioral problems.    Immunization History  Administered Date(s) Administered  . Influenza Split 11/03/2011, 11/17/2013, 11/25/2014  . Influenza-Unspecified 12/12/2015, 12/19/2015  . PPD Test 06/06/2015, 06/20/2015  . Pneumococcal Polysaccharide-23 09/17/2013  . Tdap 07/04/2016   Pertinent  Health Maintenance Due  Topic Date Due  . INFLUENZA VACCINE  11/24/2016 (Originally 09/24/2016)  . PNA vac Low Risk Adult (2 of 2 - PCV13) 02/24/2017 (Originally 09/18/2014)  . DEXA SCAN  Completed   Fall Risk  10/23/2016 04/11/2015  Falls in the past year? Yes No  Number falls in past yr: 2 or more -  Injury with Fall? Yes -   Functional Status Survey:    Vitals:   11/21/16 1441  BP: 128/66  Pulse: 90  Resp: 20  Temp: (!) 97 F (36.1 C)  TempSrc: Oral  Weight: 129 lb 6.4 oz (58.7 kg)  Height:  (1.727 m)    Body mass index is 19.68 kg/m.   Wt Readings from Last 3 Encounters:  11/21/16 129 lb 6.4 oz (58.7 kg)  11/03/16 129 lb 6.4 oz (58.7 kg)  10/24/16 129 lb (58.5 kg)   Physical Exam  Constitutional: She is oriented to person, place, and time. No distress.  Frail, elderly female in no acute distress  HENT:  Head: Normocephalic and atraumatic.  Mouth/Throat: Oropharynx is clear and moist.  Eyes: Pupils are equal, round, and reactive to light. Conjunctivae and EOM are normal.  Neck: Normal range of motion. Neck supple.  Cardiovascular: Normal rate and regular rhythm.   Murmur heard. Pulmonary/Chest: Effort normal. No respiratory distress. She has no wheezes. She has no rales.  Decreased air entry bilaterally, on 2 liters oxygen by nasal canula  Abdominal: Soft. Bowel sounds are normal. She exhibits no distension. There is no tenderness. There is no guarding.  Musculoskeletal: She exhibits edema and deformity.  Arthritis changes to her fingers. Able to move all 4 extremities, on wheelchair and needs 1 person assistance with transfer, pitting leg edema  Lymphadenopathy:    She has no cervical adenopathy.  Neurological: She is alert and oriented to person, place, and time.  Dysarthria present  Skin: Skin is warm and dry. No rash noted. She is not diaphoretic. No pallor.  Psychiatric: She has a normal mood and affect.    Labs reviewed:  Recent Labs  03/07/16 1611 03/07/16 2247 03/08/16 0512  07/08/16 07/14/16 10/23/16  NA 142 139 139  < > 142 143 141  K 4.8 4.1 3.8  < > 3.1* 3.5 3.8  CL 108 105 106  --   --   --   --   CO2 --   --   --   --   GLUCOSE 113* 109* 101*  --   --   --   --   BUN < > CREATININE 0.63 0.70 0.67  < > 0.5 0.5 0.5  CALCIUM 8.8 9.0 8.6*  --   --   --   --   < > = values in this interval not displayed.  Recent Labs  01/01/16 1446 03/07/16 2247 03/08/16 0512 05/29/16 07/08/16 10/23/16  AST 16 38 39 ALT 11 61*  57* ALKPHOS 88 127* 119 84 89 77  BILITOT 0.2 0.7 0.5  --   --   --   PROT 7.0 6.5 5.9*  --   --   --   ALBUMIN 4.0 3.1* 2.7*  --   --   --     Recent Labs  03/07/16 1611  03/09/16 0602 03/28/16 1715 04/11/16 1506  10/15/16 10/23/16 10/28/16  WBC 8.8  < > 7.5 6.5 5.9  < > 8.3 6.9 9.0  NEUTROABS 7.2  --   --  4.1 4.0  --   --   --   --   HGB 5.9 Repeated and verified X2.*  < > 8.1* 12.0 12.4  < > 10.0* 10.0* 10.2*  HCT 18.6 Repeated and verified X2.*  < > 25.6* 38.0 38.2  < > 32* 31* 32*  MCV 82.6  < > 85.0 85.1 85.1  --   --   --   --   PLT 343.0  < > 249 191.0 153.0  < > 200 204 212  < > = values in this interval not displayed. Lab Results  Component Value Date   TSH 2.01 01/01/2016   No results found for: HGBA1C Lab Results  Component Value Date   CHOL 260 (H) 04/11/2015   HDL 92.50 04/11/2015   LDLCALC 148 (H) 04/11/2015   LDLDIRECT 189.2 12/12/2008   TRIG 98.0 04/11/2015   CHOLHDL 3 04/11/2015    Significant Diagnostic Results in last 30 days:  No results found.  Assessment/Plan  COPD Continue chronic prednisone 5 mg daily with duoneb qid and guaifenesin as needed. No signs of exacerbation this visit. Continue oxygen by nasal canula.   Insomnia Melatonin has been helpful, monitor  Chronic depression and anxiety Continue zoloft 25 mg daily and monitor. Continue lorazepam  Chronic Diarrhea Idiopathic, possible IBS with her anxiety, continue loperamide as needed for now  HTN Continue lasix with kcl   Family/ staff Communication: reviewed care plan with patient and charge nurse.    Labs/tests  ordered: none   Oneal Grout, MD Internal Medicine [facility] [address] [place], [place] [address] Cell Phone (Monday-Friday 8 am - 5 pm): [phone] On Call: [phone] and follow prompts after 5 pm and on weekends Office Phone: [phone] Office Fax: [phone]

## 2016-11-25 DIAGNOSIS — D649 Anemia, unspecified: Secondary | ICD-10-CM | POA: Diagnosis not present

## 2016-11-25 LAB — CBC AND DIFFERENTIAL
HEMATOCRIT: 30 — AB (ref 36–46)
Hemoglobin: 9.3 — AB (ref 12.0–16.0)
Platelets: 236 (ref 150–399)
WBC: 8.9

## 2016-11-27 ENCOUNTER — Other Ambulatory Visit: Payer: Self-pay | Admitting: *Deleted

## 2016-12-22 ENCOUNTER — Encounter: Payer: Self-pay | Admitting: Nurse Practitioner

## 2016-12-22 ENCOUNTER — Non-Acute Institutional Stay (SKILLED_NURSING_FACILITY): Payer: Medicare Other | Admitting: Nurse Practitioner

## 2016-12-22 DIAGNOSIS — D500 Iron deficiency anemia secondary to blood loss (chronic): Secondary | ICD-10-CM

## 2016-12-22 DIAGNOSIS — I10 Essential (primary) hypertension: Secondary | ICD-10-CM | POA: Diagnosis not present

## 2016-12-22 DIAGNOSIS — F329 Major depressive disorder, single episode, unspecified: Secondary | ICD-10-CM | POA: Diagnosis not present

## 2016-12-22 DIAGNOSIS — F419 Anxiety disorder, unspecified: Secondary | ICD-10-CM | POA: Diagnosis not present

## 2016-12-22 DIAGNOSIS — J449 Chronic obstructive pulmonary disease, unspecified: Secondary | ICD-10-CM | POA: Diagnosis not present

## 2016-12-22 NOTE — Assessment & Plan Note (Signed)
blood pressure is controlled, continue Furosemide 20mg  daily.

## 2016-12-22 NOTE — Progress Notes (Signed)
Location:  [facility] Room Number: 31 Place of Service:  SNF (31) Provider:  Jonnie Truxillo, Manxie  NP  Oneal GroutPandey, Mahima, MD  Patient Care Team: Oneal GroutPandey, Mahima, MD as PCP - General (Internal Medicine) Clance, Maree KrabbeKeith M, MD as Consulting Physician (Pulmonary Disease) Gean Birchwoodowan, Frank, MD as Consulting Physician (Orthopedic Surgery) Barnett AbuElsner, Henry, MD as Consulting Physician (Neurosurgery) Maeley Matton X, NP as Nurse Practitioner (Internal Medicine)  Extended Emergency Contact Information Primary Emergency Contact: Sandi Mariscaluellar,Kristina Address: [address]          [place], [place] Mobile Phone: [phone] Relation: Daughter Secondary Emergency Contact: Royston Cowperuellar,Oscar  [place] Mobile Phone: [phone] Relation: Relative  Code Status:  DNR Goals of care: Advanced Directive information Advanced Directives 12/22/2016  Does Patient Have a Medical Advance Directive? Yes  Type of Estate agentAdvance Directive Healthcare Power of [place];Living will;Out of facility DNR (pink MOST or yellow form)  Does patient want to make changes to medical advance directive? No - Patient declined  Copy of Healthcare Power of Attorney in Chart? Yes  Would patient like information on creating a medical advance directive? -  Pre-existing out of facility DNR order (yellow form or pink MOST form) Yellow form placed in chart (order not valid for inpatient use)     Chief Complaint  Patient presents with  . Medical Management of Chronic Issues    HPI:  Pt is a 83 y.o. female seen today for medical management of chronic diseases.     The patient has history of anemia, off Fe, last Hgb 9.3 11/25/16, no s/s of bleeding. Her mood has been improved since Sertraline 25mg  qd in addition to Alprazolam 0.5mg  qid. COPD, stable, takes Prednisone 5mg , DuoNeb qid, Tessalon tid, prn Guaifenesin. HTN: blood pressure is controlled on Furosemide 20mg  daily.    Past Medical  History:  Diagnosis Date  . Alcohol abuse   . Anemia   . Anxiety   . Aortic stenosis    moderate on echo 09/2013  . Cervical spondylosis with myelopathy 01/2009   s/p decompression  . COPD (chronic obstructive pulmonary disease) (HCC)   . Dementia 09/06/2015  . Depression   . Fall at home 05/2015  . Heart murmur   . History of blood transfusion 01/02/2016   "anemia"  . Hypertension   . On home oxygen therapy    "2L; 24/7" (01/02/2016)  . Osteoporosis    (R) hip fx 11/2009 & (L) hip fx 2006  . Speech impediment    present for >9 months; no one has really been able to discern an etiology/daughter/notes 01/01/2016   Past Surgical History:  Procedure Laterality Date  . BACK SURGERY    . BLADDER SUSPENSION     "unsuccessful"  . CATARACT EXTRACTION W/ INTRAOCULAR LENS  IMPLANT, BILATERAL Bilateral 06/06/13 - 2016   "right - left"  . FRACTURE SURGERY    . HIP FRACTURE SURGERY Left 1980s  . LAPAROSCOPIC CHOLECYSTECTOMY  1995  . ORIF HIP FRACTURE Right 11/2009   Done by Dr. Turner Danielsowan  . POSTERIOR LAMINECTOMY / DECOMPRESSION CERVICAL SPINE  01/2009   Done by Dr. Danielle DessElsner  . WRIST FRACTURE [place]    [place]    Allergies  Allergen Reactions  . Ativan [Lorazepam] Other (See Comments)    Causes increased confusion/AMS    Outpatient Encounter Prescriptions as of 12/22/2016  Medication Sig  . acetaminophen (TYLENOL) 500 MG tablet Take 1,000 mg by mouth 3 (three)  times daily as needed.   . ALPRAZolam (XANAX) 0.5 MG tablet Take 0.5 mg by mouth 4 (four) times daily.   . benzonatate (TESSALON) 100 MG capsule Take 1 capsule by mouth 3 (three) times daily. Give at 8 am, 2 pm and 8 pm  . furosemide (LASIX) 20 MG tablet Take 20 mg by mouth daily.  Marland Kitchen guaifenesin (ROBITUSSIN) 100 MG/5ML syrup Take by mouth. Take 10 ml every 4 hours as needed for cough  . ipratropium-albuterol (DUONEB) 0.5-2.5 (3) MG/3ML SOLN Take 3 mLs by nebulization. Use nebulizer four times daily    . loperamide (IMODIUM) 2 MG capsule 1 tab po bid prn diarrhea  . Melatonin 5 MG TABS Take 5 mg by mouth at bedtime.   . OXYGEN Inhale 2 L/min into the lungs continuous.  . potassium chloride (K-DUR) 10 MEQ tablet Take 20 mEq by mouth daily.  . predniSONE (DELTASONE) 5 MG tablet Take 5 mg by mouth daily with breakfast.   . sertraline (ZOLOFT) 25 MG tablet Take 25 mg by mouth at bedtime.   No facility-administered encounter medications on file as of 12/22/2016.     Review of Systems  Constitutional: Negative for activity change, appetite change, chills, diaphoresis, fatigue and fever.  HENT: Positive for hearing loss. Negative for congestion and voice change.   Respiratory: Positive for cough and shortness of breath. Negative for choking and wheezing.   Cardiovascular: Negative for chest pain, palpitations and leg swelling.  Gastrointestinal: Negative for abdominal distention, abdominal pain, blood in stool and constipation.  Genitourinary: Negative for difficulty urinating and dysuria.  Musculoskeletal: Positive for gait problem.       Wheelchair for mobility, one person assist for transfer  Skin: Negative for color change, pallor, rash and wound.  Neurological: Negative for tremors, weakness and headaches.  Psychiatric/Behavioral: Negative for agitation, behavioral problems, confusion and sleep disturbance. The patient is not nervous/anxious.     Immunization History  Administered Date(s) Administered  . Influenza Split 11/03/2011, 11/17/2013, 11/25/2014  . Influenza-Unspecified 12/12/2015, 12/19/2015  . PPD Test 06/06/2015, 06/20/2015  . Pneumococcal Polysaccharide-23 09/17/2013  . Tdap 07/04/2016   Pertinent  Health Maintenance Due  Topic Date Due  . INFLUENZA VACCINE  09/24/2016  . PNA vac Low Risk Adult (2 of 2 - PCV13) 02/24/2017 (Originally 09/18/2014)  . DEXA SCAN  Completed   Fall Risk  10/23/2016 04/11/2015  Falls in the past year? Yes No  Number falls in past yr: 2 or  more -  Injury with Fall? Yes -   Functional Status Survey:    Vitals:   12/22/16 1111  BP: 132/72  Pulse: 88  Resp: 20  Temp: 97.8 F (36.6 C)  SpO2: 94%  Weight: 126 lb 11.2 oz (57.5 kg)  Height: 5\' 8"  (1.727 m)   Body mass index is 19.26 kg/m. Physical Exam  Constitutional: She is oriented to person, place, and time. She appears well-developed and well-nourished. No distress.  HENT:  Head: Normocephalic and atraumatic.  Eyes: Pupils are equal, round, and reactive to light. Conjunctivae and EOM are normal.  Neck: Normal range of motion. Neck supple. No JVD present.  Cardiovascular: Normal rate and regular rhythm.   Murmur heard. Pulmonary/Chest: She has no wheezes. She has no rales.  Abdominal: Soft. Bowel sounds are normal. She exhibits no distension. There is no tenderness.  Musculoskeletal: Normal range of motion. She exhibits no edema or tenderness.  Neurological: She is alert and oriented to person, place, and time. She exhibits normal muscle  tone. Coordination normal.  Skin: Skin is warm and dry. She is not diaphoretic.  Psychiatric: She has a normal mood and affect. Her behavior is normal. Judgment and thought content normal.    Labs reviewed:  Recent Labs  03/07/16 1611 03/07/16 2247 03/08/16 0512  07/08/16 07/14/16 10/23/16  NA 142 139 139  < > 142 143 141  K 4.8 4.1 3.8  < > 3.1* 3.5 3.8  CL 108 105 106  --   --   --   --   CO2 28 24 26   --   --   --   --   GLUCOSE 113* 109* 101*  --   --   --   --   BUN 22 20 16   < > 16 12 19   CREATININE 0.63 0.70 0.67  < > 0.5 0.5 0.5  CALCIUM 8.8 9.0 8.6*  --   --   --   --   < > = values in this interval not displayed.  Recent Labs  01/01/16 1446 03/07/16 2247 03/08/16 0512 05/29/16 07/08/16 10/23/16  AST 16 38 39 21 25 15   ALT 11 61* 57* 21 24 13   ALKPHOS 88 127* 119 84 89 77  BILITOT 0.2 0.7 0.5  --   --   --   PROT 7.0 6.5 5.9*  --   --   --   ALBUMIN 4.0 3.1* 2.7*  --   --   --     Recent Labs   03/07/16 1611  03/09/16 0602 03/28/16 1715 04/11/16 1506  10/23/16 10/28/16 11/25/16  WBC 8.8  < > 7.5 6.5 5.9  < > 6.9 9.0 8.9  NEUTROABS 7.2  --   --  4.1 4.0  --   --   --   --   HGB 5.9 Repeated and verified X2.*  < > 8.1* 12.0 12.4  < > 10.0* 10.2* 9.3*  HCT 18.6 Repeated and verified X2.*  < > 25.6* 38.0 38.2  < > 31* 32* 30*  MCV 82.6  < > 85.0 85.1 85.1  --   --   --   --   PLT 343.0  < > 249 191.0 153.0  < > 204 212 236  < > = values in this interval not displayed. Lab Results  Component Value Date   TSH 2.01 01/01/2016   No results found for: HGBA1C Lab Results  Component Value Date   CHOL 260 (H) 04/11/2015   HDL 92.50 04/11/2015   LDLCALC 148 (H) 04/11/2015   LDLDIRECT 189.2 12/12/2008   TRIG 98.0 04/11/2015   CHOLHDL 3 04/11/2015    Significant Diagnostic Results in last 30 days:  No results found.  Assessment/Plan Essential hypertension blood pressure is controlled, continue Furosemide 20mg  daily.   COPD GOLD II  COPD, stable, continue Prednisone 5mg , DuoNeb qid, Tessalon tid, prn Guaifenesin  Anxiety and depression Her mood has been improved, continue Sertraline 25mg  qd, Alprazolam 0.5mg  qid.   Anemia history of anemia, off Fe, last Hgb 9.3 11/25/16, no s/s of bleeding.     Family/ staff Communication: plan of care reviewed with the patient and charge nurse  Labs/tests ordered:  none  Time spend 25 minutes

## 2016-12-22 NOTE — Assessment & Plan Note (Signed)
COPD, stable, continue Prednisone 5mg , DuoNeb qid, Tessalon tid, prn Guaifenesin

## 2016-12-22 NOTE — Assessment & Plan Note (Signed)
Her mood has been improved, continue Sertraline 25mg  qd, Alprazolam 0.5mg  qid.

## 2016-12-22 NOTE — Assessment & Plan Note (Signed)
history of anemia, off Fe, last Hgb 9.3 11/25/16, no s/s of bleeding.

## 2016-12-30 ENCOUNTER — Non-Acute Institutional Stay (SKILLED_NURSING_FACILITY): Payer: Medicare Other | Admitting: Nurse Practitioner

## 2016-12-30 ENCOUNTER — Encounter: Payer: Self-pay | Admitting: Nurse Practitioner

## 2016-12-30 DIAGNOSIS — K219 Gastro-esophageal reflux disease without esophagitis: Secondary | ICD-10-CM

## 2016-12-30 DIAGNOSIS — D500 Iron deficiency anemia secondary to blood loss (chronic): Secondary | ICD-10-CM | POA: Diagnosis not present

## 2016-12-30 DIAGNOSIS — D509 Iron deficiency anemia, unspecified: Secondary | ICD-10-CM | POA: Diagnosis not present

## 2016-12-30 NOTE — Assessment & Plan Note (Signed)
10/28/16 wbc 9.0, Hgb 10.2, plt 212, 12/30/16 wbc 8.0, Hgb 8.0, plt 253. the patient has history of anemia, no longer taking Iron supplement, Hgb 8.0 12/30/16, no signs of bleeding. She denied abd pain, nausea, vomiting, indigestion. Will update Iron, Fe Sat, TIBC, Ferritin, Vit B12, Folate, CBC, Retic count, Guaiac stools x3. Observe.

## 2016-12-30 NOTE — Progress Notes (Signed)
Location:   [facility] Room Number: 29 Place of Service:  SNF (31) Provider: Arna Snipe Naileah Karg NP  Oneal Grout, MD  Patient Care Team: Oneal Grout, MD as PCP - General (Internal Medicine) Clance, Maree Krabbe, MD as Consulting Physician (Pulmonary Disease) Gean Birchwood, MD as Consulting Physician (Orthopedic Surgery) Barnett Abu, MD as Consulting Physician (Neurosurgery) Dellie Piasecki X, NP as Nurse Practitioner (Internal Medicine)  Extended Emergency Contact Information Primary Emergency Contact: Sandi Mariscal Address: [address]          [place], [place] Phone: [phone] Relation: Daughter Secondary Emergency Contact: Royston Cow[place] Mobile Phone: [phone] Relation: Relative  Code Status: DNR Goals of care: Advanced Directive information Advanced Directives 12/30/2016  Does Patient Have a Medical Advance Directive? Yes  Type of Estate agent of [place];Out of facility DNR (pink MOST or yellow form);Living will  Does patient want to make changes to medical advance directive? No - Patient declined  Copy of Healthcare Power of Attorney in Chart? Yes  Would patient like information on creating a medical advance directive? -  Pre-existing out of facility DNR order (yellow form or pink MOST form) Yellow form placed in chart (order not valid for inpatient use)     Chief Complaint  Patient presents with  . Acute Visit    Drop in hemoglobin     HPI:  Pt is a 83 y.o. female seen today for an acute visit for anemia, the patient has history of anemia, no longer taking Iron supplement, Hgb 8.0 12/30/16, no signs of bleeding. She denied abd pain, nausea, vomiting, indigestion. Hx of GERD, not on acid reducer, asymptomatic presently. Hx of iron deficiency anemia, 06/09/16 discontinued Fe due to her refusal.      Past Medical History:  Diagnosis Date  . Alcohol abuse   . Anemia   .  Anxiety   . Aortic stenosis    moderate on echo 09/2013  . Cervical spondylosis with myelopathy 01/2009   s/p decompression  . COPD (chronic obstructive pulmonary disease) (HCC)   . Dementia 09/06/2015  . Depression   . Fall at home 05/2015  . Heart murmur   . History of blood transfusion 01/02/2016   "anemia"  . Hypertension   . On home oxygen therapy    "2L; 24/7" (01/02/2016)  . Osteoporosis    (R) hip fx 11/2009 & (L) hip fx 2006  . Speech impediment    present for >9 months; no one has really been able to discern an etiology/daughter/notes 01/01/2016   Past Surgical History:  Procedure Laterality Date  . BACK SURGERY    . BLADDER SUSPENSION     "unsuccessful"  . CATARACT EXTRACTION W/ INTRAOCULAR LENS  IMPLANT, BILATERAL Bilateral 06/06/13 - 2016   "right - left"  . FRACTURE SURGERY    . HIP FRACTURE SURGERY Left 1980s  . LAPAROSCOPIC CHOLECYSTECTOMY  1995  . ORIF HIP FRACTURE Right 11/2009   Done by Dr. Turner Daniels  . POSTERIOR LAMINECTOMY / DECOMPRESSION CERVICAL SPINE  01/2009   Done by Dr. Danielle Dess  . WRIST FRACTURE SURGERY Bilateral    "one in [place]; one [place]"    Allergies  Allergen Reactions  . Ativan [Lorazepam] Other (See Comments)    Causes increased confusion/AMS    Allergies as of 12/30/2016      Reactions   Ativan [lorazepam] Other (See Comments)   Causes increased confusion/AMS      Medication List  Accurate as of 12/30/16 11:59 PM. Always use your most recent med list.          acetaminophen 500 MG tablet Commonly known as:  TYLENOL Take 1,000 mg by mouth 3 (three) times daily as needed.   ALPRAZolam 0.5 MG tablet Commonly known as:  XANAX Take 0.5 mg by mouth 4 (four) times daily.   benzonatate 100 MG capsule Commonly known as:  TESSALON Take 1 capsule by mouth 3 (three) times daily. Give at 8 am, 2 pm and 8 pm   furosemide 20 MG tablet Commonly known as:  LASIX Take 20 mg by mouth daily.   guaifenesin 100 MG/5ML syrup Commonly  known as:  ROBITUSSIN Take by mouth. Take 10 ml every 4 hours as needed for cough   ipratropium-albuterol 0.5-2.5 (3) MG/3ML Soln Commonly known as:  DUONEB Take 3 mLs by nebulization. Use nebulizer four times daily   loperamide 2 MG capsule Commonly known as:  IMODIUM 1 tab po bid prn diarrhea   Melatonin 5 MG Tabs Take 5 mg by mouth at bedtime.   OXYGEN Inhale 2 L/min into the lungs continuous.   potassium chloride 10 MEQ tablet Commonly known as:  K-DUR Take 20 mEq by mouth daily.   predniSONE 5 MG tablet Commonly known as:  DELTASONE Take 5 mg by mouth daily with breakfast.   sertraline 25 MG tablet Commonly known as:  ZOLOFT Take 25 mg by mouth at bedtime.      ROS was provided with assistance of staff.  Review of Systems  Constitutional: Negative for activity change, appetite change, chills, diaphoresis, fatigue and fever.  Respiratory: Positive for chest tightness and shortness of breath. Negative for wheezing.   Cardiovascular: Negative for chest pain, palpitations and leg swelling.  Gastrointestinal: Negative for abdominal distention, abdominal pain, blood in stool, constipation, diarrhea, nausea and vomiting.  Genitourinary: Negative for difficulty urinating, dysuria and hematuria.  Musculoskeletal: Positive for gait problem.  Skin: Negative for color change, pallor, rash and wound.  Neurological: Positive for speech difficulty. Negative for tremors, weakness and headaches.  Psychiatric/Behavioral: Negative for agitation and behavioral problems. The patient is nervous/anxious.     Immunization History  Administered Date(s) Administered  . Influenza Split 11/03/2011, 11/17/2013, 11/25/2014  . Influenza-Unspecified 12/12/2015, 12/19/2015, 12/17/2016  . PPD Test 06/06/2015, 06/20/2015  . Pneumococcal Polysaccharide-23 09/17/2013  . Tdap 07/04/2016   Pertinent  Health Maintenance Due  Topic Date Due  . PNA vac Low Risk Adult (2 of 2 - PCV13) 02/24/2017  (Originally 09/18/2014)  . INFLUENZA VACCINE  Completed  . DEXA SCAN  Completed   Fall Risk  10/23/2016 04/11/2015  Falls in the past year? Yes No  Number falls in past yr: 2 or more -  Injury with Fall? Yes -   Functional Status Survey:    Vitals:   12/30/16 1601  BP: 134/74  Pulse: 98  Resp: 20  Temp: 97.7 F (36.5 C)  TempSrc: Oral  SpO2: 93%  Weight: 124 lb 3.2 oz (56.3 kg)  Height: 5\' 4"  (1.626 m)   Body mass index is 21.32 kg/m. Physical Exam  Constitutional: She appears well-developed and well-nourished. No distress.  HENT:  Head: Normocephalic and atraumatic.  Eyes: Conjunctivae and EOM are normal. Pupils are equal, round, and reactive to light.  Neck: Normal range of motion. Neck supple. No JVD present. No thyromegaly present.  Cardiovascular: Normal rate and regular rhythm.  Murmur heard. Pulmonary/Chest: She has no wheezes. She has no rales.  Decreased  air entry  Abdominal: Soft. Bowel sounds are normal. There is no tenderness. There is no rebound.  Musculoskeletal: She exhibits no edema or tenderness.  Neurological: She is alert.  Oriented to person and place.   Skin: Skin is warm and dry. She is not diaphoretic. No erythema. No pallor.  Psychiatric: She has a normal mood and affect. Her behavior is normal.    Labs reviewed: Recent Labs    03/07/16 1611 03/07/16 2247 03/08/16 0512  07/08/16 07/14/16 10/23/16  NA 142 139 139   < > 142 143 141  K 4.8 4.1 3.8   < > 3.1* 3.5 3.8  CL 108 105 106  --   --   --   --   CO2 28 24 26   --   --   --   --   GLUCOSE 113* 109* 101*  --   --   --   --   BUN 22 20 16    < > 16 12 19   CREATININE 0.63 0.70 0.67   < > 0.5 0.5 0.5  CALCIUM 8.8 9.0 8.6*  --   --   --   --    < > = values in this interval not displayed.   Recent Labs    03/07/16 2247 03/08/16 0512 05/29/16 07/08/16 10/23/16  AST 38 39 21 25 15   ALT 61* 57* 21 24 13   ALKPHOS 127* 119 84 89 77  BILITOT 0.7 0.5  --   --   --   PROT 6.5 5.9*  --   --    --   ALBUMIN 3.1* 2.7*  --   --   --    Recent Labs    03/07/16 1611  03/09/16 0602 03/28/16 1715 04/11/16 1506  10/23/16 10/28/16 11/25/16  WBC 8.8   < > 7.5 6.5 5.9   < > 6.9 9.0  9.0 8.9  NEUTROABS 7.2  --   --  4.1 4.0  --   --   --   --   HGB 5.9 Repeated and verified X2.*   < > 8.1* 12.0 12.4   < > 10.0* 10.2*  10.2* 9.3*  HCT 18.6 Repeated and verified X2.*   < > 25.6* 38.0 38.2   < > 31* 32*  32* 30*  MCV 82.6   < > 85.0 85.1 85.1  --   --   --   --   PLT 343.0   < > 249 191.0 153.0   < > 204 212  212 236   < > = values in this interval not displayed.   Lab Results  Component Value Date   TSH 2.01 01/01/2016   No results found for: HGBA1C Lab Results  Component Value Date   CHOL 260 (H) 04/11/2015   HDL 92.50 04/11/2015   LDLCALC 148 (H) 04/11/2015   LDLDIRECT 189.2 12/12/2008   TRIG 98.0 04/11/2015   CHOLHDL 3 04/11/2015    Significant Diagnostic Results in last 30 days:  No results found.  Assessment/Plan: Iron deficiency anemia 10/28/16 wbc 9.0, Hgb 10.2, plt 212, 12/30/16 wbc 8.0, Hgb 8.0, plt 253. the patient has history of anemia, no longer taking Iron supplement, Hgb 8.0 12/30/16, no signs of bleeding. She denied abd pain, nausea, vomiting, indigestion. Will update Iron, Fe Sat, TIBC, Ferritin, Vit B12, Folate, CBC, Retic count, Guaiac stools x3. Observe.   GERD (gastroesophageal reflux disease) Stable, not taking acid reducer.     Family/ staff Communication: plan of  care reviewed with the patient and charge nurse.   Labs/tests ordered:  CBC, Vit B12, folate, Iron, Sat Fe, TIBC, ferritin, Guaiac stools x3  Time spend 25 minutes.

## 2016-12-30 NOTE — Assessment & Plan Note (Signed)
Stable, not taking acid reducer.  

## 2016-12-31 DIAGNOSIS — E785 Hyperlipidemia, unspecified: Secondary | ICD-10-CM | POA: Diagnosis not present

## 2016-12-31 DIAGNOSIS — D500 Iron deficiency anemia secondary to blood loss (chronic): Secondary | ICD-10-CM | POA: Diagnosis not present

## 2016-12-31 DIAGNOSIS — D638 Anemia in other chronic diseases classified elsewhere: Secondary | ICD-10-CM | POA: Diagnosis not present

## 2016-12-31 DIAGNOSIS — R042 Hemoptysis: Secondary | ICD-10-CM | POA: Diagnosis not present

## 2016-12-31 DIAGNOSIS — D510 Vitamin B12 deficiency anemia due to intrinsic factor deficiency: Secondary | ICD-10-CM | POA: Diagnosis not present

## 2016-12-31 DIAGNOSIS — I509 Heart failure, unspecified: Secondary | ICD-10-CM | POA: Diagnosis not present

## 2016-12-31 DIAGNOSIS — R0902 Hypoxemia: Secondary | ICD-10-CM | POA: Diagnosis not present

## 2017-01-09 ENCOUNTER — Non-Acute Institutional Stay (SKILLED_NURSING_FACILITY): Payer: Medicare Other | Admitting: Internal Medicine

## 2017-01-09 ENCOUNTER — Encounter: Payer: Self-pay | Admitting: Internal Medicine

## 2017-01-09 DIAGNOSIS — D519 Vitamin B12 deficiency anemia, unspecified: Secondary | ICD-10-CM | POA: Diagnosis not present

## 2017-01-09 DIAGNOSIS — K219 Gastro-esophageal reflux disease without esophagitis: Secondary | ICD-10-CM

## 2017-01-09 DIAGNOSIS — J9611 Chronic respiratory failure with hypoxia: Secondary | ICD-10-CM

## 2017-01-09 DIAGNOSIS — D638 Anemia in other chronic diseases classified elsewhere: Secondary | ICD-10-CM

## 2017-01-09 NOTE — Progress Notes (Signed)
Location:  [facility] Room Number: 31 Place of Service:  SNF [phone]) Provider:  Oneal Grout MD  Oneal Grout, MD  Patient Care Team: Oneal Grout, MD as PCP - General (Internal Medicine) Clance, Maree Krabbe, MD as Consulting Physician (Pulmonary Disease) Gean Birchwood, MD as Consulting Physician (Orthopedic Surgery) Barnett Abu, MD as Consulting Physician (Neurosurgery) Mast, Man X, NP as Nurse Practitioner (Internal Medicine)  Extended Emergency Contact Information Primary Emergency Contact: Sandi Mariscal Address: [address]          [place], [place] Phone: [phone] Relation: Daughter Secondary Emergency Contact: Royston Cowper [place] Mobile Phone: [phone] Relation: Relative  Code Status:  DNR Goals of care: Advanced Directive information Advanced Directives 01/09/2017  Does Patient Have a Medical Advance Directive? Yes  Type of Estate agent of [place];Living will;Out of facility DNR (pink MOST or yellow form)  Does patient want to make changes to medical advance directive? No - Patient declined  Copy of Healthcare Power of Attorney in Chart? Yes  Would patient like information on creating a medical advance directive? -  Pre-existing out of facility DNR order (yellow form or pink MOST form) Yellow form placed in chart (order not valid for inpatient use)     Chief Complaint  Patient presents with  . Medical Management of Chronic Issues    Routine Visit     HPI:  Pt is a 83 y.o. female seen today for medical management of chronic diseases. She is also followed by hospice services. She is currently on oxygen by nasal canula. On lab reviewed borderline low normal b12. 2 hemoccult stool are negative, 1 pending. No blood in stool per nursing. Has expressive aphasia. Weight low but stable. She is tolerating iron supplement well.    Past Medical History:    Diagnosis Date  . Alcohol abuse   . Anemia   . Anxiety   . Aortic stenosis    moderate on echo 09/2013  . Cervical spondylosis with myelopathy 01/2009   s/p decompression  . COPD (chronic obstructive pulmonary disease) (HCC)   . Dementia 09/06/2015  . Depression   . Fall at home 05/2015  . Heart murmur   . History of blood transfusion 01/02/2016   "anemia"  . Hypertension   . On home oxygen therapy    "2L; 24/7" (01/02/2016)  . Osteoporosis    (R) hip fx 11/2009 & (L) hip fx 2006  . Speech impediment    present for >9 months; no one has really been able to discern an etiology/daughter/notes 01/01/2016   Past Surgical History:  Procedure Laterality Date  . BACK SURGERY    . BLADDER SUSPENSION     "unsuccessful"  . CATARACT EXTRACTION W/ INTRAOCULAR LENS  IMPLANT, BILATERAL Bilateral 06/06/13 - 2016   "right - left"  . FRACTURE SURGERY    . HIP FRACTURE SURGERY Left 1980s  . LAPAROSCOPIC CHOLECYSTECTOMY  1995  . ORIF HIP FRACTURE Right 11/2009   Done by Dr. Turner Daniels  . POSTERIOR LAMINECTOMY / DECOMPRESSION CERVICAL SPINE  01/2009   Done by Dr. Danielle Dess  . WRIST FRACTURE SURGERY Bilateral    "one in [place]; one [place]"    Allergies  Allergen Reactions  . Ativan [Lorazepam] Other (See Comments)    Causes increased confusion/AMS    Outpatient Encounter Medications as of 01/09/2017  Medication Sig  . acetaminophen (TYLENOL) 500 MG tablet Take 1,000 mg by mouth 3 (three) times  daily as needed.   . ALPRAZolam (XANAX) 0.5 MG tablet Take 0.5 mg by mouth 4 (four) times daily.   . benzonatate (TESSALON) 100 MG capsule Take 1 capsule by mouth 3 (three) times daily. Give at 8 am, 2 pm and 8 pm  . ferrous sulfate 325 (65 FE) MG EC tablet Take 325 mg 2 (two) times daily by mouth.  . furosemide (LASIX) 20 MG tablet Take 20 mg by mouth daily.  Marland Kitchen. guaifenesin (ROBITUSSIN) 100 MG/5ML syrup Take by mouth. Take 10 ml every 4 hours as needed for cough  . ipratropium-albuterol (DUONEB) 0.5-2.5  (3) MG/3ML SOLN Take 3 mLs by nebulization. Use nebulizer four times daily  . loperamide (IMODIUM) 2 MG capsule 1 tab po bid prn diarrhea  . Melatonin 5 MG TABS Take 5 mg by mouth at bedtime.   . OXYGEN Inhale 2 L/min into the lungs continuous.  . potassium chloride (K-DUR) 10 MEQ tablet Take 20 mEq by mouth daily.  . predniSONE (DELTASONE) 5 MG tablet Take 5 mg by mouth daily with breakfast.   . sertraline (ZOLOFT) 25 MG tablet Take 25 mg by mouth at bedtime.   No facility-administered encounter medications on file as of 01/09/2017.     Review of Systems  Reason unable to perform ROS: limited with her aphasia.  Constitutional: Negative for appetite change and fever.  HENT: Positive for hearing loss. Negative for congestion and mouth sores.   Respiratory: Positive for cough and shortness of breath.   Cardiovascular: Negative for chest pain.  Gastrointestinal: Negative for abdominal pain, nausea and vomiting.       Occasional loose stools  Genitourinary: Negative for dysuria.  Musculoskeletal: Positive for gait problem.  Skin: Negative for rash.  Neurological: Negative for dizziness and headaches.  Psychiatric/Behavioral: Positive for confusion.    Immunization History  Administered Date(s) Administered  . Influenza Split 11/03/2011, 11/17/2013, 11/25/2014  . Influenza-Unspecified 12/12/2015, 12/19/2015, 12/17/2016  . PPD Test 06/06/2015, 06/20/2015  . Pneumococcal Polysaccharide-23 09/17/2013  . Tdap 07/04/2016   Pertinent  Health Maintenance Due  Topic Date Due  . PNA vac Low Risk Adult (2 of 2 - PCV13) 02/24/2017 (Originally 09/18/2014)  . INFLUENZA VACCINE  Completed  . DEXA SCAN  Completed   Fall Risk  10/23/2016 04/11/2015  Falls in the past year? Yes No  Number falls in past yr: 2 or more -  Injury with Fall? Yes -   Functional Status Survey:    Vitals:   01/09/17 1120  BP: 138/78  Pulse: 84  Resp: 20  Temp: (!) 97.4 F (36.3 C)  TempSrc: Oral  SpO2: 94%    Weight: 124 lb 3.2 oz (56.3 kg)  Height: 5\' 4"  (1.626 m)   Body mass index is 21.32 kg/m.   Wt Readings from Last 3 Encounters:  01/09/17 124 lb 3.2 oz (56.3 kg)  12/30/16 124 lb 3.2 oz (56.3 kg)  12/22/16 126 lb 11.2 oz (57.5 kg)   Physical Exam  Constitutional: No distress.  Thin built, frail, elderly female in no distress  HENT:  Head: Normocephalic and atraumatic.  Eyes: Pupils are equal, round, and reactive to light.  Neck: Neck supple.  Cardiovascular: Normal rate and regular rhythm.  Murmur heard. Pulmonary/Chest: Effort normal.  Poor air movement, no wheezing or rhonchi. On oxygen 2l/min by nasal canula  Abdominal: Soft. Bowel sounds are normal. There is no tenderness. There is no guarding.  Musculoskeletal: She exhibits no edema.  Wheelchair bound, can propel some, needs assistance  with transfers  Lymphadenopathy:    She has no cervical adenopathy.  Neurological:  Alert and oriented to person and place. Expressive aphasia.   Skin: Skin is warm and dry. She is not diaphoretic.  Psychiatric: She has a normal mood and affect.    Labs reviewed: Recent Labs    03/07/16 1611 03/07/16 2247 03/08/16 0512  07/08/16 07/14/16 10/23/16  NA 142 139 139   < > 142 143 141  K 4.8 4.1 3.8   < > 3.1* 3.5 3.8  CL 108 105 106  --   --   --   --   CO2 28 24 26   --   --   --   --   GLUCOSE 113* 109* 101*  --   --   --   --   BUN 22 20 16    < > 16 12 19   CREATININE 0.63 0.70 0.67   < > 0.5 0.5 0.5  CALCIUM 8.8 9.0 8.6*  --   --   --   --    < > = values in this interval not displayed.   Recent Labs    03/07/16 2247 03/08/16 0512 05/29/16 07/08/16 10/23/16  AST 38 39 21 25 15   ALT 61* 57* 21 24 13   ALKPHOS 127* 119 84 89 77  BILITOT 0.7 0.5  --   --   --   PROT 6.5 5.9*  --   --   --   ALBUMIN 3.1* 2.7*  --   --   --    Recent Labs    03/07/16 1611  03/09/16 0602 03/28/16 1715 04/11/16 1506  10/23/16 10/28/16 11/25/16  WBC 8.8   < > 7.5 6.5 5.9   < > 6.9 9.0  9.0  8.9  NEUTROABS 7.2  --   --  4.1 4.0  --   --   --   --   HGB 5.9 Repeated and verified X2.*   < > 8.1* 12.0 12.4   < > 10.0* 10.2*  10.2* 9.3*  HCT 18.6 Repeated and verified X2.*   < > 25.6* 38.0 38.2   < > 31* 32*  32* 30*  MCV 82.6   < > 85.0 85.1 85.1  --   --   --   --   PLT 343.0   < > 249 191.0 153.0   < > 204 212  212 236   < > = values in this interval not displayed.   Lab Results  Component Value Date   TSH 2.01 01/01/2016   No results found for: HGBA1C Lab Results  Component Value Date   CHOL 260 (H) 04/11/2015   HDL 92.50 04/11/2015   LDLCALC 148 (H) 04/11/2015   LDLDIRECT 189.2 12/12/2008   TRIG 98.0 04/11/2015   CHOLHDL 3 04/11/2015    Significant Diagnostic Results in last 30 days:  No results found.  Assessment/Plan  Anemia of chronic disease No active bleed reported. Guaiac stool x 2 negative. Monitor cbc periodically. Continue iron supplement and with low b12, start b12 as below.  b12 deficiency Start b12 1000 mcg daily and monitor  gerd Place her on pantoprazole 20 mg daily with her recent drop in Hb and being on chronic prednisone for gi prophylaxis.   Chronic respiratory failure Continue oxygen by nasal canula and bronchodilators with chronic prednisone. Monitor for signs of exacerbation. Breathing currently comfortable.   Family/ staff Communication: reviewed care plan with patient and charge nurse.  Labs/tests ordered:  none   Blanchie Serve, MD Internal Medicine [facility] [address] [place], [place] [address] Cell Phone (Monday-Friday 8 am - 5 pm): [phone] On Call: [phone] and follow prompts after 5 pm and on weekends Office Phone: [phone] Office Fax: [phone]

## 2017-01-20 ENCOUNTER — Other Ambulatory Visit: Payer: Self-pay | Admitting: Nurse Practitioner

## 2017-01-20 DIAGNOSIS — D500 Iron deficiency anemia secondary to blood loss (chronic): Secondary | ICD-10-CM | POA: Diagnosis not present

## 2017-01-20 LAB — CBC AND DIFFERENTIAL
HEMATOCRIT: 72 — AB (ref 36–46)
HEMOGLOBIN: 26 — AB (ref 12.0–16.0)
Platelets: 253 (ref 150–399)
WBC: 8.3

## 2017-01-27 ENCOUNTER — Encounter: Payer: Self-pay | Admitting: Nurse Practitioner

## 2017-01-27 ENCOUNTER — Non-Acute Institutional Stay (SKILLED_NURSING_FACILITY): Payer: Medicare Other | Admitting: Nurse Practitioner

## 2017-01-27 ENCOUNTER — Encounter: Payer: Self-pay | Admitting: *Deleted

## 2017-01-27 DIAGNOSIS — D500 Iron deficiency anemia secondary to blood loss (chronic): Secondary | ICD-10-CM | POA: Diagnosis not present

## 2017-01-27 DIAGNOSIS — F329 Major depressive disorder, single episode, unspecified: Secondary | ICD-10-CM | POA: Diagnosis not present

## 2017-01-27 DIAGNOSIS — E538 Deficiency of other specified B group vitamins: Secondary | ICD-10-CM

## 2017-01-27 DIAGNOSIS — F419 Anxiety disorder, unspecified: Secondary | ICD-10-CM

## 2017-01-27 DIAGNOSIS — J449 Chronic obstructive pulmonary disease, unspecified: Secondary | ICD-10-CM

## 2017-01-27 DIAGNOSIS — I10 Essential (primary) hypertension: Secondary | ICD-10-CM

## 2017-01-27 LAB — CBC AND DIFFERENTIAL
HCT: 30 — AB (ref 36–46)
HEMOGLOBIN: 9.4 — AB (ref 12.0–16.0)
Platelets: 296 (ref 150–399)
WBC: 10.6

## 2017-01-27 NOTE — Assessment & Plan Note (Signed)
12/30/16 Hgb 8.0, 12/31/16 Hgb 8.2,Iron total 13(45-160), Fe sat 4(11-50%), ferritin 17(20-288), Vit B12 263 01/01/17 adding Iron 325mg  bid po, 01/20/17 Hgb 8.3, 01/27/17 Hgb 9.4, continue Fe 325mg  po bid

## 2017-01-27 NOTE — Progress Notes (Signed)
This encounter was created in error - please disregard.

## 2017-01-27 NOTE — Assessment & Plan Note (Signed)
COPD, O2 dependent, continue Prednisone 5mg  qd, DuoNeb qid, Guaifenesin 5ml q4h prn, Benzonatate 100mg  po tid.

## 2017-01-27 NOTE — Progress Notes (Signed)
Location:   [facility] Room Number: 9831 Place of Service:  SNF (31) Provider: Arna SnipeManXie Mast NP  Oneal GroutPandey, Mahima, MD  Patient Care Team: Oneal GroutPandey, Mahima, MD as PCP - General (Internal Medicine) Clance, Maree KrabbeKeith M, MD as Consulting Physician (Pulmonary Disease) Gean Birchwoodowan, Frank, MD as Consulting Physician (Orthopedic Surgery) Barnett AbuElsner, Henry, MD as Consulting Physician (Neurosurgery) Mast, Man X, NP as Nurse Practitioner (Internal Medicine)  Extended Emergency Contact Information Primary Emergency Contact: Sandi Mariscaluellar,Kristina Address: [address]          [place], [place] Mobile Phone: [phone] Relation: Daughter Secondary Emergency Contact: Royston Cowperuellar,Oscar  [place] Mobile Phone: [phone] Relation: Relative  Code Status:  DNR Goals of care: Advanced Directive information Advanced Directives 01/09/2017  Does Patient Have a Medical Advance Directive? Yes  Type of Estate agentAdvance Directive Healthcare Power of [place];Living will;Out of facility DNR (pink MOST or yellow form)  Does patient want to make changes to medical advance directive? No - Patient declined  Copy of Healthcare Power of Attorney in Chart? Yes  Would patient like information on creating a medical advance directive? -  Pre-existing out of facility DNR order (yellow form or pink MOST form) Yellow form placed in chart (order not valid for inpatient use)     Chief Complaint  Patient presents with  . Medical Management of Chronic Issues    HPI:  Pt is a 83 y.o. female seen today for medical management of chronic diseases.     Hx of IDA, resumed Fe 325mg  bid 01/01/17 due to Hgb 8s and total iron 13 12/31/16, Vit B12 1000mcg po daily added for Vit B12 263 12/31/16,  improved, Hgb 9.4 today.  Her mood is stable while on Sertraline 25mg  qd, Alprazolam 0.5mg  qid. COPD, O2 dependent, taking Prednisone 5mg  qd, DuoNeb qid, Guaifenesin 5ml q4h prn, Benzonatate 100mg  po tid. No apparent  edema, blood pressure is stable, taking Furosemide 20mg  daily.    Past Medical History:  Diagnosis Date  . Alcohol abuse   . Anemia   . Anxiety   . Aortic stenosis    moderate on echo 09/2013  . Cervical spondylosis with myelopathy 01/2009   s/p decompression  . COPD (chronic obstructive pulmonary disease) (HCC)   . Dementia 09/06/2015  . Depression   . Fall at home 05/2015  . Heart murmur   . History of blood transfusion 01/02/2016   "anemia"  . Hypertension   . On home oxygen therapy    "2L; 24/7" (01/02/2016)  . Osteoporosis    (R) hip fx 11/2009 & (L) hip fx 2006  . Speech impediment    present for >9 months; no one has really been able to discern an etiology/daughter/notes 01/01/2016   Past Surgical History:  Procedure Laterality Date  . BACK SURGERY    . BLADDER SUSPENSION     "unsuccessful"  . CATARACT EXTRACTION W/ INTRAOCULAR LENS  IMPLANT, BILATERAL Bilateral 06/06/13 - 2016   "right - left"  . FRACTURE SURGERY    . HIP FRACTURE SURGERY Left 1980s  . LAPAROSCOPIC CHOLECYSTECTOMY  1995  . ORIF HIP FRACTURE Right 11/2009   Done by Dr. Turner Danielsowan  . POSTERIOR LAMINECTOMY / DECOMPRESSION CERVICAL SPINE  01/2009   Done by Dr. Danielle DessElsner  . WRIST FRACTURE SURG[place]    [place]"    Allergies  Allergen Reactions  . Ativan [Lorazepam] Other (See Comments)    Causes increased confusion/AMS    Allergies as of  01/27/2017      Reactions   Ativan [lorazepam] Other (See Comments)   Causes increased confusion/AMS      Medication List        Accurate as of 01/27/17 11:59 PM. Always use your most recent med list.          acetaminophen 500 MG tablet Commonly known as:  TYLENOL Take 1,000 mg by mouth 3 (three) times daily as needed.   ALPRAZolam 0.5 MG tablet Commonly known as:  XANAX Take 0.5 mg by mouth 4 (four) times daily.   benzonatate 100 MG capsule Commonly known as:  TESSALON Take 1 capsule by mouth 3 (three) times daily. Give at 8  am, 2 pm and 8 pm   ferrous sulfate 325 (65 FE) MG EC tablet Take 325 mg 2 (two) times daily by mouth.   furosemide 20 MG tablet Commonly known as:  LASIX Take 20 mg by mouth daily.   guaifenesin 100 MG/5ML syrup Commonly known as:  ROBITUSSIN Take by mouth. Take 10 ml every 4 hours as needed for cough   ipratropium-albuterol 0.5-2.5 (3) MG/3ML Soln Commonly known as:  DUONEB Take 3 mLs by nebulization. Use nebulizer four times daily   loperamide 2 MG capsule Commonly known as:  IMODIUM 1 tab po bid prn diarrhea   Melatonin 5 MG Tabs Take 5 mg by mouth at bedtime.   OXYGEN Inhale 2 L/min into the lungs continuous.   potassium chloride 10 MEQ tablet Commonly known as:  K-DUR Take 20 mEq by mouth daily.   predniSONE 5 MG tablet Commonly known as:  DELTASONE Take 5 mg by mouth daily with breakfast.   sertraline 25 MG tablet Commonly known as:  ZOLOFT Take 25 mg by mouth at bedtime.     ROS was provided with assistance of staff  Review of Systems  Constitutional: Negative for activity change, appetite change, chills, diaphoresis, fatigue and fever.  HENT: Positive for hearing loss. Negative for congestion, trouble swallowing and voice change.   Eyes: Positive for visual disturbance.       Wearing corrective glasses.   Respiratory: Positive for cough. Negative for choking, chest tightness, shortness of breath and wheezing.   Cardiovascular: Negative for chest pain, palpitations and leg swelling.  Gastrointestinal: Negative for abdominal distention, abdominal pain, constipation, diarrhea, nausea and vomiting.  Endocrine: Negative for cold intolerance.  Genitourinary: Negative for difficulty urinating, dysuria and urgency.  Musculoskeletal: Positive for gait problem.       W/c to get around, one person transfer  Skin: Negative for color change, pallor, rash and wound.  Neurological: Positive for speech difficulty. Negative for tremors, weakness and headaches.    Psychiatric/Behavioral: Positive for confusion. Negative for agitation, behavioral problems, hallucinations and sleep disturbance. The patient is not nervous/anxious.     Immunization History  Administered Date(s) Administered  . Influenza Split 11/03/2011, 11/17/2013, 11/25/2014  . Influenza-Unspecified 12/12/2015, 12/19/2015, 12/17/2016  . PPD Test 06/06/2015, 06/20/2015  . Pneumococcal Polysaccharide-23 09/17/2013  . Tdap 07/04/2016   Pertinent  Health Maintenance Due  Topic Date Due  . PNA vac Low Risk Adult (2 of 2 - PCV13) 02/24/2017 (Originally 09/18/2014)  . INFLUENZA VACCINE  Completed  . DEXA SCAN  Completed   Fall Risk  10/23/2016 04/11/2015  Falls in the past year? Yes No  Number falls in past yr: 2 or more -  Injury with Fall? Yes -   Functional Status Survey:    Vitals:   01/27/17 1532  BP: 138/76  Pulse: 80  Resp: 20  Temp: 97.8 F (36.6 C)  SpO2: 96%  Weight: 122 lb 4.8 oz (55.5 kg)  Height: 5\' 4"  (1.626 m)   Body mass index is 20.99 kg/m. Physical Exam  Constitutional: She appears well-developed and well-nourished.  HENT:  Head: Normocephalic and atraumatic.  Eyes: Conjunctivae and EOM are normal. Pupils are equal, round, and reactive to light.  Neck: Normal range of motion. Neck supple. No JVD present. No thyromegaly present.  Cardiovascular: Normal rate and regular rhythm.  Murmur heard. Pulmonary/Chest: Effort normal and breath sounds normal. She has no wheezes. She has no rales.  Decreased air entry  Abdominal: Soft. Bowel sounds are normal. She exhibits no distension. There is no tenderness.  Musculoskeletal: Normal range of motion. She exhibits no edema or tenderness.  One person transfer, propels self in wheelchair.   Neurological: She is alert. She exhibits normal muscle tone. Coordination normal.  Oriented to persona and place. Expressive aphasia.   Skin: Skin is warm and dry. No rash noted. No erythema. No pallor.  Psychiatric: She has a  normal mood and affect. Her behavior is normal.    Labs reviewed: Recent Labs    03/07/16 1611 03/07/16 2247 03/08/16 0512  07/08/16 07/14/16 10/23/16  NA 142 139 139   < > 142 143 141  K 4.8 4.1 3.8   < > 3.1* 3.5 3.8  CL 108 105 106  --   --   --   --   CO2 28 24 26   --   --   --   --   GLUCOSE 113* 109* 101*  --   --   --   --   BUN 22 20 16    < > 16 12 19   CREATININE 0.63 0.70 0.67   < > 0.5 0.5 0.5  CALCIUM 8.8 9.0 8.6*  --   --   --   --    < > = values in this interval not displayed.   Recent Labs    03/07/16 2247 03/08/16 0512 05/29/16 07/08/16 10/23/16  AST 38 39 21 25 15   ALT 61* 57* 21 24 13   ALKPHOS 127* 119 84 89 77  BILITOT 0.7 0.5  --   --   --   PROT 6.5 5.9*  --   --   --   ALBUMIN 3.1* 2.7*  --   --   --    Recent Labs    03/07/16 1611  03/09/16 0602 03/28/16 1715 04/11/16 1506  10/23/16 10/28/16 11/25/16  WBC 8.8   < > 7.5 6.5 5.9   < > 6.9 9.0  9.0 8.9  NEUTROABS 7.2  --   --  4.1 4.0  --   --   --   --   HGB 5.9 Repeated and verified X2.*   < > 8.1* 12.0 12.4   < > 10.0* 10.2*  10.2* 9.3*  HCT 18.6 Repeated and verified X2.*   < > 25.6* 38.0 38.2   < > 31* 32*  32* 30*  MCV 82.6   < > 85.0 85.1 85.1  --   --   --   --   PLT 343.0   < > 249 191.0 153.0   < > 204 212  212 236   < > = values in this interval not displayed.   Lab Results  Component Value Date   TSH 2.01 01/01/2016   No results found for: HGBA1C Lab Results  Component  Value Date   CHOL 260 (H) 04/11/2015   HDL 92.50 04/11/2015   LDLCALC 148 (H) 04/11/2015   LDLDIRECT 189.2 12/12/2008   TRIG 98.0 04/11/2015   CHOLHDL 3 04/11/2015    Significant Diagnostic Results in last 30 days:  No results found.  Assessment/Plan  Iron deficiency anemia 12/30/16 Hgb 8.0, 12/31/16 Hgb 8.2,Iron total 13(45-160), Fe sat 4(11-50%), ferritin 17(20-288), Vit B12 263 01/01/17 adding Iron 325mg  bid po, 01/20/17 Hgb 8.3, 01/27/17 Hgb 9.4, continue Fe 325mg  po bid   Anxiety and  depression Her mood is stable, continue  Sertraline 25mg  qd, Alprazolam 0.5mg  qid.   COPD GOLD II  COPD, O2 dependent, continue Prednisone 5mg  qd, DuoNeb qid, Guaifenesin 5ml q4h prn, Benzonatate 100mg  po tid.   Essential hypertension No apparent edema, blood pressure is stable, continue Furosemide 20mg  daily, Kcl 20meq qd.    Low vitamin B12 level 12/31/16 Vit B12 263, 01/08/17 Vit B12 1000mcg po qd added.  36   Family/ staff Communication: plan of care reviewed with the patient and charge nurse  Labs/tests ordered: CBC done 01/27/17  Time spend 25 minutes.

## 2017-01-27 NOTE — Assessment & Plan Note (Signed)
12/31/16 Vit B12 263, 01/08/17 Vit B12 1000mcg po qd added.

## 2017-01-27 NOTE — Progress Notes (Signed)
Location:  [facility] Room Number: 31 Place of Service:  SNF (31) Provider:  Mast, Manxie  NP  Oneal GroutPandey, Mahima, MD  Patient Care Team: Oneal GroutPandey, Mahima, MD as PCP - General (Internal Medicine) Clance, Maree KrabbeKeith M, MD as Consulting Physician (Pulmonary Disease) Gean Birchwoodowan, Frank, MD as Consulting Physician (Orthopedic Surgery) Barnett AbuElsner, Henry, MD as Consulting Physician (Neurosurgery) Mast, Man X, NP as Nurse Practitioner (Internal Medicine)  Extended Emergency Contact Information Primary Emergency Contact: Sandi Mariscaluellar,Kristina Address: [address]          [place], [place] Mobile Phone: [phone] Relation: Daughter Secondary Emergency Contact: Royston Cowperuellar,Oscar  [place] Mobile Phone: [phone] Relation: Relative  Code Status:  DNR Goals of care: Advanced Directive information Advanced Directives 01/09/2017  Does Patient Have a Medical Advance Directive? Yes  Type of Estate agentAdvance Directive Healthcare Power of HallAttorney;Living will;Out of facility DNR (pink MOST or yellow form)  Does patient want to make changes to medical advance directive? No - Patient declined  Copy of Healthcare Power of Attorney in Chart? Yes  Would patient like information on creating a medical advance directive? -  Pre-existing out of facility DNR order (yellow form or pink MOST form) Yellow form placed in chart (order not valid for inpatient use)     Chief Complaint  Patient presents with  . Medical Management of Chronic Issues    HPI:  Pt is a 83 y.o. female seen today for medical management of chronic diseases.     Past Medical History:  Diagnosis Date  . Alcohol abuse   . Anemia   . Anxiety   . Aortic stenosis    moderate on echo 09/2013  . Cervical spondylosis with myelopathy 01/2009   s/p decompression  . COPD (chronic obstructive pulmonary disease) (HCC)   . Dementia 09/06/2015  . Depression   . Fall at home 05/2015  . Heart  murmur   . History of blood transfusion 01/02/2016   "anemia"  . Hypertension   . On home oxygen therapy    "2L; 24/7" (01/02/2016)  . Osteoporosis    (R) hip fx 11/2009 & (L) hip fx 2006  . Speech impediment    present for >9 months; no one has really been able to discern an etiology/daughter/notes 01/01/2016   Past Surgical History:  Procedure Laterality Date  . BACK SURGERY    . BLADDER SUSPENSION     "unsuccessful"  . CATARACT EXTRACTION W/ INTRAOCULAR LENS  IMPLANT, BILATERAL Bilateral 06/06/13 - 2016   "right - left"  . FRACTURE SURGERY    . HIP FRACTURE SURGERY Left 1980s  . LAPAROSCOPIC CHOLECYSTECTOMY  1995  . ORIF HIP FRACTURE Right 11/2009   Done by Dr. Turner Danielsowan  . POSTERIOR LAMINECTOMY / DECOMPRESSION CERVICAL SPINE  01/2009   Done by Dr. Danielle DessElsner  . WRIST FRACTURE [place]    [place]"    Allergies  Allergen Reactions  . Ativan [Lorazepam] Other (See Comments)    Causes increased confusion/AMS    Outpatient Encounter Medications as of 01/27/2017  Medication Sig  . acetaminophen (TYLENOL) 500 MG tablet Take 1,000 mg by mouth 3 (three) times daily as needed.   . ALPRAZolam (XANAX) 0.5 MG tablet Take 0.5 mg by mouth 4 (four) times daily.   . benzonatate (TESSALON) 100 MG capsule Take 1 capsule by mouth 3 (three) times daily. Give at 8 am, 2 pm and 8 pm  . ferrous sulfate 325 (65 FE)  MG EC tablet Take 325 mg 2 (two) times daily by mouth.  . furosemide (LASIX) 20 MG tablet Take 20 mg by mouth daily.  Marland Kitchen guaifenesin (ROBITUSSIN) 100 MG/5ML syrup Take by mouth. Take 10 ml every 4 hours as needed for cough  . ipratropium-albuterol (DUONEB) 0.5-2.5 (3) MG/3ML SOLN Take 3 mLs by nebulization. Use nebulizer four times daily  . loperamide (IMODIUM) 2 MG capsule 1 tab po bid prn diarrhea  . Melatonin 5 MG TABS Take 5 mg by mouth at bedtime.   . OXYGEN Inhale 2 L/min into the lungs continuous.  . potassium chloride (K-DUR) 10 MEQ tablet Take 20 mEq by  mouth daily.  . predniSONE (DELTASONE) 5 MG tablet Take 5 mg by mouth daily with breakfast.   . sertraline (ZOLOFT) 25 MG tablet Take 25 mg by mouth at bedtime.   No facility-administered encounter medications on file as of 01/27/2017.     Review of Systems  Immunization History  Administered Date(s) Administered  . Influenza Split 11/03/2011, 11/17/2013, 11/25/2014  . Influenza-Unspecified 12/12/2015, 12/19/2015, 12/17/2016  . PPD Test 06/06/2015, 06/20/2015  . Pneumococcal Polysaccharide-23 09/17/2013  . Tdap 07/04/2016   Pertinent  Health Maintenance Due  Topic Date Due  . PNA vac Low Risk Adult (2 of 2 - PCV13) 02/24/2017 (Originally 09/18/2014)  . INFLUENZA VACCINE  Completed  . DEXA SCAN  Completed   Fall Risk  10/23/2016 04/11/2015  Falls in the past year? Yes No  Number falls in past yr: 2 or more -  Injury with Fall? Yes -   Functional Status Survey:    Vitals:   01/27/17 1532  BP: 138/76  Pulse: 80  Resp: 20  Temp: 97.8 F (36.6 C)  SpO2: 96%  Weight: 122 lb 4.8 oz (55.5 kg)  Height: 5\' 4"  (1.626 m)   Body mass index is 20.99 kg/m. Physical Exam  Labs reviewed: Recent Labs    03/07/16 1611 03/07/16 2247 03/08/16 0512  07/08/16 07/14/16 10/23/16  NA 142 139 139   < > 142 143 141  K 4.8 4.1 3.8   < > 3.1* 3.5 3.8  CL 108 105 106  --   --   --   --   CO2 28 24 26   --   --   --   --   GLUCOSE 113* 109* 101*  --   --   --   --   BUN 22 20 16    < > 16 12 19   CREATININE 0.63 0.70 0.67   < > 0.5 0.5 0.5  CALCIUM 8.8 9.0 8.6*  --   --   --   --    < > = values in this interval not displayed.   Recent Labs    03/07/16 2247 03/08/16 0512 05/29/16 07/08/16 10/23/16  AST 38 39 21 25 15   ALT 61* 57* 21 24 13   ALKPHOS 127* 119 84 89 77  BILITOT 0.7 0.5  --   --   --   PROT 6.5 5.9*  --   --   --   ALBUMIN 3.1* 2.7*  --   --   --    Recent Labs    03/07/16 1611  03/09/16 0602 03/28/16 1715 04/11/16 1506  10/23/16 10/28/16 11/25/16  WBC 8.8   < >  7.5 6.5 5.9   < > 6.9 9.0  9.0 8.9  NEUTROABS 7.2  --   --  4.1 4.0  --   --   --   --  HGB 5.9 Repeated and verified X2.*   < > 8.1* 12.0 12.4   < > 10.0* 10.2*  10.2* 9.3*  HCT 18.6 Repeated and verified X2.*   < > 25.6* 38.0 38.2   < > 31* 32*  32* 30*  MCV 82.6   < > 85.0 85.1 85.1  --   --   --   --   PLT 343.0   < > 249 191.0 153.0   < > 204 212  212 236   < > = values in this interval not displayed.   Lab Results  Component Value Date   TSH 2.01 01/01/2016   No results found for: HGBA1C Lab Results  Component Value Date   CHOL 260 (H) 04/11/2015   HDL 92.50 04/11/2015   LDLCALC 148 (H) 04/11/2015   LDLDIRECT 189.2 12/12/2008   TRIG 98.0 04/11/2015   CHOLHDL 3 04/11/2015    Significant Diagnostic Results in last 30 days:  No results found.  Assessment/Plan 1. Iron deficiency anemia due to chronic blood loss   2. Anxiety and depression   3. COPD GOLD II    4. Essential hypertension   5. Low vitamin B12 level   Family/ staff Communication:   Labs/tests ordered:

## 2017-01-27 NOTE — Assessment & Plan Note (Signed)
Her mood is stable, continue  Sertraline 25mg  qd, Alprazolam 0.5mg  qid.

## 2017-01-27 NOTE — Assessment & Plan Note (Signed)
No apparent edema, blood pressure is stable, continue Furosemide 20mg  daily, Kcl 20meq qd.

## 2017-02-03 ENCOUNTER — Encounter: Payer: Self-pay | Admitting: *Deleted

## 2017-02-03 DIAGNOSIS — D500 Iron deficiency anemia secondary to blood loss (chronic): Secondary | ICD-10-CM | POA: Diagnosis not present

## 2017-02-03 LAB — CBC
HCT: 28.6
HGB: 9
WBC: 10.7
platelet count: 258

## 2017-02-26 ENCOUNTER — Encounter: Payer: Self-pay | Admitting: Internal Medicine

## 2017-02-26 ENCOUNTER — Non-Acute Institutional Stay (SKILLED_NURSING_FACILITY): Payer: Medicare Other | Admitting: Internal Medicine

## 2017-02-26 DIAGNOSIS — F411 Generalized anxiety disorder: Secondary | ICD-10-CM | POA: Diagnosis not present

## 2017-02-26 DIAGNOSIS — J449 Chronic obstructive pulmonary disease, unspecified: Secondary | ICD-10-CM | POA: Diagnosis not present

## 2017-02-26 DIAGNOSIS — G4709 Other insomnia: Secondary | ICD-10-CM | POA: Diagnosis not present

## 2017-02-26 DIAGNOSIS — R1310 Dysphagia, unspecified: Secondary | ICD-10-CM | POA: Diagnosis not present

## 2017-02-27 NOTE — Progress Notes (Signed)
Location:  [facility] Room Number: 31 Place of Service:  SNF [phone](31) Provider:  Oneal GroutMahima Anara Cowman MD  Oneal GroutPandey, Danyell Awbrey, MD  Patient Care Team: Oneal GroutPandey, Keltie Labell, MD as PCP - General (Internal Medicine) Clance, Maree KrabbeKeith M, MD as Consulting Physician (Pulmonary Disease) Gean Birchwoodowan, Frank, MD as Consulting Physician (Orthopedic Surgery) Barnett AbuElsner, Henry, MD as Consulting Physician (Neurosurgery) Mast, Man X, NP as Nurse Practitioner (Internal Medicine)  Extended Emergency Contact Information Primary Emergency Contact: Sandi Mariscaluellar,Kristina Address: [address]          [place], [place] Mobile Phone: [phone] Relation: Daughter Secondary Emergency Contact: Royston Cowperuellar,Oscar  [place] Mobile Phone: [phone] Relation: Relative  Code Status:  DNR  Goals of care: Advanced Directive information Advanced Directives 02/26/2017  Does Patient Have a Medical Advance Directive? Yes  Type of Estate agentAdvance Directive Healthcare Power of [place]m HallAttorney;Living will;Out of facility DNR (pink MOST or yellow form)  Does patient want to make changes to medical advance directive? No - Patient declined  Copy of Healthcare Power of Attorney in Chart? Yes  Would patient like information on creating a medical advance directive? -  Pre-existing out of facility DNR order (yellow form or pink MOST form) Yellow form placed in chart (order not valid for inpatient use)     Chief Complaint  Patient presents with  . Medical Management of Chronic Issues    Routine Visit     HPI:  Pt is a 84 y.o. female seen today for medical management of chronic diseases.  She has been at her baseline with ongoing dyspnea, she is on o2 by nasal canula. She requires assistance with ADLs. HPI and ROS limited with her dysarthria and expressive aphasia.    Past Medical History:  Diagnosis Date  . Alcohol abuse   . Anemia   . Anxiety   . Aortic stenosis    moderate on echo 09/2013  .  Cervical spondylosis with myelopathy 01/2009   s/p decompression  . COPD (chronic obstructive pulmonary disease) (HCC)   . Dementia 09/06/2015  . Depression   . Fall at home 05/2015  . Heart murmur   . History of blood transfusion 01/02/2016   "anemia"  . Hypertension   . On home oxygen therapy    "2L; 24/7" (01/02/2016)  . Osteoporosis    (R) hip fx 11/2009 & (L) hip fx 2006  . Speech impediment    present for >9 months; no one has really been able to discern an etiology/daughter/notes 01/01/2016   Past Surgical History:  Procedure Laterality Date  . BACK SURGERY    . BLADDER SUSPENSION     "unsuccessful"  . CATARACT EXTRACTION W/ INTRAOCULAR LENS  IMPLANT, BILATERAL Bilateral 06/06/13 - 2016   "right - left"  . FRACTURE SURGERY    . HIP FRACTURE SURGERY Left 1980s  . LAPAROSCOPIC CHOLECYSTECTOMY  1995  . ORIF HIP FRACTURE Right 11/2009   Done by Dr. Turner Danielsowan  . POSTERIOR LAMINECTOMY / DECOMPRESSION CERVICAL SPINE  01/2009   Done by Dr. Danielle DessElsner  . WRIST FRACTURE SURGERY B[place]    [place]"    Allergies  Allergen Reactions  . Ativan [Lorazepam] Other (See Comments)    Causes increased confusion/AMS    Outpatient Encounter Medications as of 02/26/2017  Medication Sig  . acetaminophen (TYLENOL) 500 MG tablet Take 1,000 mg by mouth 3 (three) times daily as needed.   . ALPRAZolam (XANAX) 0.5 MG tablet Take 0.5 mg by mouth  4 (four) times daily.   . benzonatate (TESSALON) 100 MG capsule Take 1 capsule by mouth 3 (three) times daily. Give at 8 am, 2 pm and 8 pm  . ferrous sulfate 325 (65 FE) MG EC tablet Take 325 mg 2 (two) times daily by mouth.  . furosemide (LASIX) 20 MG tablet Take 20 mg by mouth daily.  Marland Kitchen guaifenesin (ROBITUSSIN) 100 MG/5ML syrup Take by mouth. Take 10 ml every 4 hours as needed for cough  . ipratropium-albuterol (DUONEB) 0.5-2.5 (3) MG/3ML SOLN Take 3 mLs by nebulization. Use nebulizer four times daily  . loperamide (IMODIUM) 2 MG capsule 1  tab po bid prn diarrhea  . Melatonin 5 MG TABS Take 5 mg by mouth at bedtime.   . OXYGEN Inhale 2 L/min into the lungs continuous.  . pantoprazole (PROTONIX) 20 MG tablet Take 20 mg by mouth daily.  . potassium chloride (K-DUR) 10 MEQ tablet Take 20 mEq by mouth daily.  . predniSONE (DELTASONE) 5 MG tablet Take 5 mg by mouth daily with breakfast.   . sertraline (ZOLOFT) 25 MG tablet Take 25 mg by mouth at bedtime.  . vitamin B-12 (CYANOCOBALAMIN) 1000 MCG tablet Take 1,000 mcg by mouth daily.   No facility-administered encounter medications on file as of 02/26/2017.     Review of Systems  Reason unable to perform ROS: limited with dysarthria and aphasia.  Constitutional: Negative for fever.  HENT: Positive for hearing loss. Negative for mouth sores and sore throat.   Eyes: Positive for visual disturbance.  Respiratory: Positive for cough and shortness of breath.   Cardiovascular: Negative for chest pain and palpitations.  Gastrointestinal: Positive for diarrhea. Negative for abdominal pain, nausea and vomiting.  Genitourinary: Negative for dysuria.  Musculoskeletal: Positive for arthralgias and gait problem.  Skin: Negative for rash.  Neurological: Negative for dizziness and headaches.  Hematological: Bruises/bleeds easily.  Psychiatric/Behavioral: Negative for behavioral problems. The patient is nervous/anxious.     Immunization History  Administered Date(s) Administered  . Influenza Split 11/03/2011, 11/17/2013, 11/25/2014  . Influenza-Unspecified 12/12/2015, 12/19/2015, 12/17/2016  . PPD Test 06/06/2015, 06/20/2015  . Pneumococcal Polysaccharide-23 09/17/2013  . Tdap 07/04/2016   Pertinent  Health Maintenance Due  Topic Date Due  . PNA vac Low Risk Adult (2 of 2 - PCV13) 09/18/2014  . INFLUENZA VACCINE  Completed  . DEXA SCAN  Completed   Fall Risk  10/23/2016 04/11/2015  Falls in the past year? Yes No  Number falls in past yr: 2 or more -  Injury with Fall? Yes -    Functional Status Survey:    Vitals:   02/26/17 1525  BP: 134/74  Pulse: 80  Resp: 20  Temp: 98.3 F (36.8 C)  TempSrc: Oral  SpO2: 96%  Weight: 119 lb 9.6 oz (54.3 kg)  Height: 5\' 4"  (1.626 m)   Body mass index is 20.53 kg/m.   Wt Readings from Last 3 Encounters:  02/26/17 119 lb 9.6 oz (54.3 kg)  01/27/17 122 lb 4.8 oz (55.5 kg)  01/09/17 124 lb 3.2 oz (56.3 kg)   Physical Exam  Constitutional:  Frail, thin built, chronically ill appearing   HENT:  Head: Normocephalic and atraumatic.  Mouth/Throat: Oropharynx is clear and moist.  Eyes: Conjunctivae and EOM are normal. Right eye exhibits no discharge. Left eye exhibits no discharge.  Neck: Neck supple.  Cardiovascular: Normal rate and regular rhythm.  Murmur heard. Pulmonary/Chest: Effort normal. She has no wheezes. She has no rales. She exhibits no tenderness.  Poor air movement, on o2 by nasal canula  Abdominal: Soft. Bowel sounds are normal. There is no tenderness.  Musculoskeletal: She exhibits edema and deformity.  Wheels herself around on wheelchair, unsteady gait, able to move all 4 ext  Lymphadenopathy:    She has no cervical adenopathy.  Neurological: She is alert.  Oriented to person and place  Skin: Skin is warm and dry. She is not diaphoretic.  Psychiatric:  anxious    Labs reviewed: Recent Labs    03/07/16 1611 03/07/16 2247 03/08/16 0512  07/08/16 07/14/16 10/23/16  NA 142 139 139   < > 142 143 141  K 4.8 4.1 3.8   < > 3.1* 3.5 3.8  CL 108 105 106  --   --   --   --   CO2 28 24 26   --   --   --   --   GLUCOSE 113* 109* 101*  --   --   --   --   BUN 22 20 16    < > 16 12 19   CREATININE 0.63 0.70 0.67   < > 0.5 0.5 0.5  CALCIUM 8.8 9.0 8.6*  --   --   --   --    < > = values in this interval not displayed.   Recent Labs    03/07/16 2247 03/08/16 0512 05/29/16 07/08/16 10/23/16  AST 38 39 21 25 15   ALT 61* 57* 21 24 13   ALKPHOS 127* 119 84 89 77  BILITOT 0.7 0.5  --   --   --    PROT 6.5 5.9*  --   --   --   ALBUMIN 3.1* 2.7*  --   --   --    Recent Labs    03/07/16 1611  03/09/16 0602 03/28/16 1715 04/11/16 1506  11/25/16 01/20/17 01/27/17 02/03/17  WBC 8.8   < > 7.5 6.5 5.9   < > 8.9 8.3 10.6 10.7  NEUTROABS 7.2  --   --  4.1 4.0  --   --   --   --   --   HGB 5.9 Repeated and verified X2.*   < > 8.1* 12.0 12.4   < > 9.3* 26.0* 9.4* 9.0  HCT 18.6 Repeated and verified X2.*   < > 25.6* 38.0 38.2   < > 30* 72* 30* 28.6  MCV 82.6   < > 85.0 85.1 85.1  --   --   --   --   --   PLT 343.0   < > 249 191.0 153.0   < > 236 253 296  --    < > = values in this interval not displayed.   Lab Results  Component Value Date   TSH 2.01 01/01/2016   No results found for: HGBA1C Lab Results  Component Value Date   CHOL 260 (H) 04/11/2015   HDL 92.50 04/11/2015   LDLCALC 148 (H) 04/11/2015   LDLDIRECT 189.2 12/12/2008   TRIG 98.0 04/11/2015   CHOLHDL 3 04/11/2015    Significant Diagnostic Results in last 30 days:  No results found.  Assessment/Plan  COPD Continue prednisone 5 mg daily, prn robitussin for cough, o2 by nasal canula. Monitor for early signs of exacerbation. Continue bronchodilators.   GAD Continue sertraline and monitor. Continue alprazolam 0.5 mg qid.   Insomnia Sleeping well, continue melatonin  dysphagia Continue pantoprazole, aspiration precautions   Family/ staff Communication: reviewed care plan with patient and charge nurse.  Labs/tests ordered:  none   Blanchie Serve, MD Internal Medicine [facility] [address] [place], [place] [address] Cell Phone (Monday-Friday 8 am - 5 pm): [phone] On Call: [phone] and follow prompts after 5 pm and on weekends Office Phone: [phone] Office Fax: [phone]

## 2017-03-03 ENCOUNTER — Other Ambulatory Visit: Payer: Self-pay | Admitting: *Deleted

## 2017-03-03 DIAGNOSIS — D500 Iron deficiency anemia secondary to blood loss (chronic): Secondary | ICD-10-CM | POA: Diagnosis not present

## 2017-03-03 LAB — CBC AND DIFFERENTIAL
HEMATOCRIT: 27 — AB (ref 36–46)
HEMOGLOBIN: 8.5 — AB (ref 12.0–16.0)
WBC: 9.7

## 2017-03-10 ENCOUNTER — Other Ambulatory Visit: Payer: Self-pay | Admitting: *Deleted

## 2017-03-10 DIAGNOSIS — D500 Iron deficiency anemia secondary to blood loss (chronic): Secondary | ICD-10-CM | POA: Diagnosis not present

## 2017-03-10 LAB — CBC AND DIFFERENTIAL
HCT: 258 — AB (ref 36–46)
Hemoglobin: 7.9 — AB (ref 12.0–16.0)
WBC: 6.9

## 2017-03-17 DIAGNOSIS — D500 Iron deficiency anemia secondary to blood loss (chronic): Secondary | ICD-10-CM | POA: Diagnosis not present

## 2017-03-17 LAB — CBC AND DIFFERENTIAL
HCT: 26 — AB (ref 36–46)
HEMOGLOBIN: 8.2 — AB (ref 12.0–16.0)
PLATELETS: 281 (ref 150–399)
WBC: 11.7

## 2017-03-18 ENCOUNTER — Other Ambulatory Visit: Payer: Self-pay | Admitting: *Deleted

## 2017-03-30 ENCOUNTER — Encounter: Payer: Self-pay | Admitting: Nurse Practitioner

## 2017-03-30 ENCOUNTER — Non-Acute Institutional Stay (SKILLED_NURSING_FACILITY): Payer: Medicare Other | Admitting: Nurse Practitioner

## 2017-03-30 DIAGNOSIS — R609 Edema, unspecified: Secondary | ICD-10-CM | POA: Diagnosis not present

## 2017-03-30 DIAGNOSIS — D500 Iron deficiency anemia secondary to blood loss (chronic): Secondary | ICD-10-CM

## 2017-03-30 DIAGNOSIS — J449 Chronic obstructive pulmonary disease, unspecified: Secondary | ICD-10-CM

## 2017-03-30 DIAGNOSIS — R296 Repeated falls: Secondary | ICD-10-CM

## 2017-03-30 NOTE — Assessment & Plan Note (Addendum)
Her chronic anemia, Hgb 8.2 03/17/17, continue Iron and Vit B12 in setting of low Vit B12 level. No apparent bleeding. Repeat CBC

## 2017-03-30 NOTE — Assessment & Plan Note (Signed)
No apparent swelling in BLE, continue Furosemide 20mg  daily. Update BMP

## 2017-03-30 NOTE — Assessment & Plan Note (Signed)
COPD, she is O2 dependent, chronic SOB, continue Prednisone 5mg  daily, Morphine 5mg  q2h prn, Duoneb qid, Robitussin 10ml q4h prn, Tessalon 100mg  tid.

## 2017-03-30 NOTE — Progress Notes (Signed)
Location:  [facility] Room Number: 31 Place of Service:  SNF (31) Provider:  Onia Shiflett, Manxie  NP  Oneal GroutPandey, Mahima, MD  Patient Care Team: Oneal GroutPandey, Mahima, MD as PCP - General (Internal Medicine) Clance, Maree KrabbeKeith M, MD as Consulting Physician (Pulmonary Disease) Gean Birchwoodowan, Frank, MD as Consulting Physician (Orthopedic Surgery) Barnett AbuElsner, Henry, MD as Consulting Physician (Neurosurgery) Lash Matulich X, NP as Nurse Practitioner (Internal Medicine)  Extended Emergency Contact Information Primary Emergency Contact: Sandi Mariscaluellar,Kristina Address: [address]          [place], [place] Mobile Phone: [phone] Relation: Daughter Secondary Emergency Contact: Royston Cowperuellar,Oscar  [place] Mobile Phone: [phone] Relation: Relative  Code Status:  DNR Goals of care: Advanced Directive information Advanced Directives 02/26/2017  Does Patient Have a Medical Advance Directive? Yes  Type of Estate agentAdvance Directive Healthcare Power of [place];Living will;Out of facility DNR (pink MOST or yellow form)  Does patient want to make changes to medical advance directive? No - Patient declined  Copy of Healthcare Power of Attorney in Chart? Yes  Would patient like information on creating a medical advance directive? -  Pre-existing out of facility DNR order (yellow form or pink MOST form) Yellow form placed in chart (order not valid for inpatient use)     Chief Complaint  Patient presents with  . Medical Management of Chronic Issues    Iron deficiency, COPD, HTN,    HPI:  Pt is a 84 y.o. female seen today for medical management of chronic diseases.   The patient has history of COPD, she is O2 dependent, chronic SOB, on  Prednisone 5mg  daily, Morphine 5mg  q2h prn, Duoneb qid, Robitussin 10ml q4h prn, Tessalon 100mg  tid. Her chronic anemia, Hgb 8.2 03/17/17, she takes Iron and Vit B12. No apparent swelling in BLE, on Furosemide 20mg  daily.    Past  Medical History:  Diagnosis Date  . Alcohol abuse   . Anemia   . Anxiety   . Aortic stenosis    moderate on echo 09/2013  . Cervical spondylosis with myelopathy 01/2009   s/p decompression  . COPD (chronic obstructive pulmonary disease) (HCC)   . Dementia 09/06/2015  . Depression   . Fall at home 05/2015  . Heart murmur   . History of blood transfusion 01/02/2016   "anemia"  . Hypertension   . On home oxygen therapy    "2L; 24/7" (01/02/2016)  . Osteoporosis    (R) hip fx 11/2009 & (L) hip fx 2006  . Speech impediment    present for >9 months; no one has really been able to discern an etiology/daughter/notes 01/01/2016   Past Surgical History:  Procedure Laterality Date  . BACK SURGERY    . BLADDER SUSPENSION     "unsuccessful"  . CATARACT EXTRACTION W/ INTRAOCULAR LENS  IMPLANT, BILATERAL Bilateral 06/06/13 - 2016   "right - left"  . FRACTURE SURGERY    . HIP FRACTURE SURGERY Left 1980s  . LAPAROSCOPIC CHOLECYSTECTOMY  1995  . ORIF HIP FRACTURE Right 11/2009   Done by Dr. Turner Danielsowan  . POSTERIOR LAMINECTOMY / DECOMPRESSION CERVICAL SPINE  01/2009   Done by Dr. Danielle DessElsner  . [place] FRACTURE [place]    [place]    Allergies  Allergen Reactions  . Ativan [Lorazepam] Other (See Comments)    Causes increased confusion/AMS    Outpatient Encounter Medications as of 03/30/2017  Medication Sig  . acetaminophen (TYLENOL) 500 MG tablet Take 1,000  mg by mouth 3 (three) times daily as needed.   . ALPRAZolam (XANAX) 0.5 MG tablet Take 0.5 mg by mouth 4 (four) times daily.   . benzonatate (TESSALON) 100 MG capsule Take 1 capsule by mouth 3 (three) times daily. Give at 8 am, 2 pm and 8 pm  . ferrous sulfate 325 (65 FE) MG EC tablet Take 325 mg 2 (two) times daily by mouth.  . furosemide (LASIX) 20 MG tablet Take 20 mg by mouth daily.  Marland Kitchen guaifenesin (ROBITUSSIN) 100 MG/5ML syrup Take by mouth. Take 10 ml every 4 hours as needed for cough  . ipratropium-albuterol  (DUONEB) 0.5-2.5 (3) MG/3ML SOLN Take 3 mLs by nebulization. Use nebulizer four times daily  . loperamide (IMODIUM) 2 MG capsule 1 tab po bid prn diarrhea  . Melatonin 5 MG TABS Take 5 mg by mouth at bedtime.   Marland Kitchen morphine (ROXANOL) 20 MG/ML concentrated solution Place 5 mg under the tongue every 2 (two) hours as needed for severe pain or shortness of breath.  . OXYGEN Inhale 2 L/min into the lungs continuous.  . pantoprazole (PROTONIX) 20 MG tablet Take 20 mg by mouth daily.  . potassium chloride (K-DUR) 10 MEQ tablet Take 20 mEq by mouth daily.  . predniSONE (DELTASONE) 5 MG tablet Take 5 mg by mouth daily with breakfast.   . sertraline (ZOLOFT) 25 MG tablet Take 25 mg by mouth at bedtime.  . vitamin B-12 (CYANOCOBALAMIN) 1000 MCG tablet Take 1,000 mcg by mouth daily.   No facility-administered encounter medications on file as of 03/30/2017.     Review of Systems  Constitutional: Negative for activity change, appetite change, chills, diaphoresis, fatigue and fever.  HENT: Positive for hearing loss. Negative for congestion, trouble swallowing and voice change.   Eyes: Negative for visual disturbance.  Respiratory: Positive for cough and shortness of breath. Negative for chest tightness and wheezing.        O2 dependent.   Cardiovascular: Negative for chest pain, palpitations and leg swelling.  Gastrointestinal: Negative for abdominal distention, abdominal pain, constipation, diarrhea, nausea and vomiting.  Endocrine: Negative for cold intolerance.  Genitourinary: Negative for difficulty urinating, dysuria and urgency.       Incontinent of urine.   Musculoskeletal: Positive for gait problem. Negative for joint swelling.       Fall 03/28/17  Skin: Positive for wound.       Skin abrasions the right forehead, right elbow, 2x right forearm  Neurological: Negative for tremors, speech difficulty, weakness and headaches.  Psychiatric/Behavioral: Positive for confusion. Negative for agitation,  behavioral problems, hallucinations and sleep disturbance. The patient is nervous/anxious.     Immunization History  Administered Date(s) Administered  . Influenza Split 11/03/2011, 11/17/2013, 11/25/2014  . Influenza-Unspecified 12/12/2015, 12/19/2015, 12/17/2016  . PPD Test 06/06/2015, 06/20/2015  . Pneumococcal Polysaccharide-23 09/17/2013  . Tdap 07/04/2016   Pertinent  Health Maintenance Due  Topic Date Due  . PNA vac Low Risk Adult (2 of 2 - PCV13) 09/18/2014  . INFLUENZA VACCINE  Completed  . DEXA SCAN  Completed   Fall Risk  10/23/2016 04/11/2015  Falls in the past year? Yes No  Number falls in past yr: 2 or more -  Injury with Fall? Yes -   Functional Status Survey:    Vitals:   03/30/17 1250  BP: 129/76  Pulse: 100  Resp: 20  Temp: 99.3 F (37.4 C)  SpO2: 95%  Weight: 115 lb (52.2 kg)  Height: 5\' 4"  (1.626 m)  Body mass index is 19.74 kg/m. Physical Exam  Constitutional: She appears well-developed and well-nourished.  HENT:  Head: Normocephalic and atraumatic.  Eyes: Conjunctivae and EOM are normal. Pupils are equal, round, and reactive to light.  Neck: Normal range of motion. Neck supple. No JVD present. No thyromegaly present.  Cardiovascular:  Murmur heard. Pulmonary/Chest: Effort normal. She has no wheezes. She has no rales.  Decreased air entry  Abdominal: Soft. Bowel sounds are normal. She exhibits no distension. There is no tenderness.  Musculoskeletal: Normal range of motion. She exhibits no edema or tenderness.  One person assist for transfer, wheelchair to get around  Neurological: She is alert. She exhibits normal muscle tone. Coordination normal.  Oriented to person and her room on unit.   Skin: Skin is warm and dry. No rash noted. There is pallor.  Skin abrasions the right forehead, right elbow, 2x right forearm, no sign of infection.    Psychiatric: Her behavior is normal.  anxious    Labs reviewed: Recent Labs    07/08/16 07/14/16  10/23/16  NA 142 143 141  K 3.1* 3.5 3.8  BUN 16 12 19   CREATININE 0.5 0.5 0.5   Recent Labs    05/29/16 07/08/16 10/23/16  AST 21 25 15   ALT 21 24 13   ALKPHOS 84 89 77   Recent Labs    04/11/16 1506  01/20/17 01/27/17  03/03/17 03/10/17 03/17/17  WBC 5.9   < > 8.3 10.6   < > 9.7 6.9 11.7  NEUTROABS 4.0  --   --   --   --   --   --   --   HGB 12.4   < > 26.0* 9.4*   < > 8.5* 7.9* 8.2*  HCT 38.2   < > 72* 30*   < > 27* 258* 26*  MCV 85.1  --   --   --   --   --   --   --   PLT 153.0   < > 253 296  --   --   --  281   < > = values in this interval not displayed.   Lab Results  Component Value Date   TSH 2.01 01/01/2016   No results found for: HGBA1C Lab Results  Component Value Date   CHOL 260 (H) 04/11/2015   HDL 92.50 04/11/2015   LDLCALC 148 (H) 04/11/2015   LDLDIRECT 189.2 12/12/2008   TRIG 98.0 04/11/2015   CHOLHDL 3 04/11/2015    Significant Diagnostic Results in last 30 days:  No results found.  Assessment/Plan COPD GOLD II  COPD, she is O2 dependent, chronic SOB, continue Prednisone 5mg  daily, Morphine 5mg  q2h prn, Duoneb qid, Robitussin 10ml q4h prn, Tessalon 100mg  tid.    Iron deficiency anemia Her chronic anemia, Hgb 8.2 03/17/17, continue Iron and Vit B12 in setting of low Vit B12 level. No apparent bleeding. Repeat CBC  Edema No apparent swelling in BLE, continue Furosemide 20mg  daily. Update BMP   Falls frequently Last fall 03/28/17, the patient was found sitting on floor with the right shoe off and wheelchair behind her, sustained abrasion right forehead, right elbow, x2 forearm. No sign of infection. Should heal. Close supervision needed for safety. The patient's lack of safety awareness and increased frailty are contributory to her falling.      Family/ staff Communication: plan of care reviewed with the patient and charge nurse.   Labs/tests ordered: CBC BMP  Time spend 25 minutes.

## 2017-03-30 NOTE — Assessment & Plan Note (Signed)
Last fall 03/28/17, the patient was found sitting on floor with the right shoe off and wheelchair behind her, sustained abrasion right forehead, right elbow, x2 forearm. No sign of infection. Should heal. Close supervision needed for safety. The patient's lack of safety awareness and increased frailty are contributory to her falling.

## 2017-03-31 DIAGNOSIS — D500 Iron deficiency anemia secondary to blood loss (chronic): Secondary | ICD-10-CM | POA: Diagnosis not present

## 2017-03-31 DIAGNOSIS — J441 Chronic obstructive pulmonary disease with (acute) exacerbation: Secondary | ICD-10-CM | POA: Diagnosis not present

## 2017-03-31 DIAGNOSIS — I509 Heart failure, unspecified: Secondary | ICD-10-CM | POA: Diagnosis not present

## 2017-04-27 ENCOUNTER — Encounter: Payer: Self-pay | Admitting: Nurse Practitioner

## 2017-04-27 ENCOUNTER — Non-Acute Institutional Stay (SKILLED_NURSING_FACILITY): Payer: Medicare Other | Admitting: Nurse Practitioner

## 2017-04-27 DIAGNOSIS — J449 Chronic obstructive pulmonary disease, unspecified: Secondary | ICD-10-CM

## 2017-04-27 DIAGNOSIS — R609 Edema, unspecified: Secondary | ICD-10-CM | POA: Diagnosis not present

## 2017-04-27 DIAGNOSIS — D500 Iron deficiency anemia secondary to blood loss (chronic): Secondary | ICD-10-CM

## 2017-04-27 DIAGNOSIS — F329 Major depressive disorder, single episode, unspecified: Secondary | ICD-10-CM | POA: Diagnosis not present

## 2017-04-27 DIAGNOSIS — F419 Anxiety disorder, unspecified: Secondary | ICD-10-CM

## 2017-04-27 DIAGNOSIS — F32A Depression, unspecified: Secondary | ICD-10-CM

## 2017-04-27 NOTE — Progress Notes (Addendum)
Location:  [facility] Room Number: 31 Place of Service:  SNF (31) Provider:  Raynie Steinhaus, Manxie  NP  Oneal Grout, MD  Patient Care Team: Oneal Grout, MD as PCP - General (Internal Medicine) Clance, Maree Krabbe, MD as Consulting Physician (Pulmonary Disease) Gean Birchwood, MD as Consulting Physician (Orthopedic Surgery) Barnett Abu, MD as Consulting Physician (Neurosurgery) Jaiana Sheffer X, NP as Nurse Practitioner (Internal Medicine)  Extended Emergency Contact Information Primary Emergency Contact: Sandi Mariscal Address: [address]          [place], [place] Phone: [phone] Relation: Daughter Secondary Emergency Contact: Royston Cowper [place] Mobile Phone: [phone] Relation: Relative  Code Status:  DNR Goals of care: Advanced Directive information Advanced Directives 04/27/2017  Does Patient Have a Medical Advance Directive? Yes  Type of Estate agent of [place];Living will;Out of facility DNR (pink MOST or yellow form)  Does patient want to make changes to medical advance directive? No - Patient declined  Copy of Healthcare Power of Attorney in Chart? Yes  Would patient like information on creating a medical advance directive? -  Pre-existing out of facility DNR order (yellow form or pink MOST form) Yellow form placed in chart (order not valid for inpatient use)     Chief Complaint  Patient presents with  . Medical Management of Chronic Issues    F/U - edema, COPD,    HPI:  Pt is a 84 y.o. female seen today for medical management of chronic diseases.    The patient resides in SNF [facility], self propels wheelchair to get around, one person transfer, incontinent of urine sometimes, under Hospice service for supportive care. Hx of COPD, chronic cough, on Prednisone 5mg  daily, O2 dependent, prn q2h Morphine 5mg , DuoNeb 4x/day, prn q4h Guaifenesin 10ml po, Benzonatate 100mg   tid po. Her mood is managed with Alprazolam 0.5mg  qid po. Takes Fe 325mg  bid, last Hgb 8.3 03/31/17, taking Furosemide 20mg  daily, only trace edema in ankles.    Past Medical History:  Diagnosis Date  . Alcohol abuse   . Anemia   . Anxiety   . Aortic stenosis    moderate on echo 09/2013  . Cervical spondylosis with myelopathy 01/2009   s/p decompression  . COPD (chronic obstructive pulmonary disease) (HCC)   . Dementia 09/06/2015  . Depression   . Fall at home 05/2015  . Heart murmur   . History of blood transfusion 01/02/2016   "anemia"  . Hypertension   . On home oxygen therapy    "2L; 24/7" (01/02/2016)  . Osteoporosis    (R) hip fx 11/2009 & (L) hip fx 2006  . Speech impediment    present for >9 months; no one has really been able to discern an etiology/daughter/notes 01/01/2016   Past Surgical History:  Procedure Laterality Date  . BACK SURGERY    . BLADDER SUSPENSION     "unsuccessful"  . CATARACT EXTRACTION W/ INTRAOCULAR LENS  IMPLANT, BILATERAL Bilateral 06/06/13 - 2016   "right - left"  . FRACTURE SURGERY    . HIP FRACTURE SURGERY Left 1980s  . LAPAROSCOPIC CHOLECYSTECTOMY  1995  . ORIF HIP FRACTURE Right 11/2009   Done by Dr. Turner Daniels  . POSTERIOR LAMINECTOMY / DECOMPRESSION CERVICAL SPINE  01/2009   Done by Dr. Danielle Dess  . WRIST FRACTURE SURGERY Bilateral    "one in [place]; one [place]"    Allergies  Allergen Reactions  . Ativan [Lorazepam] Other (See Comments)  Causes increased confusion/AMS    Outpatient Encounter Medications as of 04/27/2017  Medication Sig  . acetaminophen (TYLENOL) 500 MG tablet Take 1,000 mg by mouth 3 (three) times daily as needed.   . ALPRAZolam (XANAX) 0.5 MG tablet Take 0.5 mg by mouth 4 (four) times daily.   . benzonatate (TESSALON) 100 MG capsule Take 1 capsule by mouth 3 (three) times daily. Give at 8 am, 2 pm and 8 pm  . ferrous sulfate 325 (65 FE) MG EC tablet Take 325 mg 2 (two) times daily by mouth.  . furosemide (LASIX) 20 MG  tablet Take 20 mg by mouth daily.  Marland Kitchen guaifenesin (ROBITUSSIN) 100 MG/5ML syrup Take by mouth. Take 10 ml every 4 hours as needed for cough  . ipratropium-albuterol (DUONEB) 0.5-2.5 (3) MG/3ML SOLN Take 3 mLs by nebulization. Use nebulizer four times daily  . loperamide (IMODIUM) 2 MG capsule 1 tab po bid prn diarrhea  . Melatonin 5 MG TABS Take 5 mg by mouth at bedtime.   Marland Kitchen morphine (ROXANOL) 20 MG/ML concentrated solution Place 5 mg under the tongue every 2 (two) hours as needed for severe pain or shortness of breath.  . OXYGEN Inhale 2 L/min into the lungs continuous.  . pantoprazole (PROTONIX) 20 MG tablet Take 20 mg by mouth daily.  . potassium chloride (K-DUR) 10 MEQ tablet Take 20 mEq by mouth daily.  . predniSONE (DELTASONE) 5 MG tablet Take 5 mg by mouth daily with breakfast.   . sertraline (ZOLOFT) 25 MG tablet Take 25 mg by mouth at bedtime.  . vitamin B-12 (CYANOCOBALAMIN) 1000 MCG tablet Take 1,000 mcg by mouth daily.   No facility-administered encounter medications on file as of 04/27/2017.    ROS was provided with assistance of staff Review of Systems  Constitutional: Negative for activity change, appetite change, chills, diaphoresis, fatigue and fever.  HENT: Positive for hearing loss and trouble swallowing. Negative for congestion and voice change.        Nectar thick thickened liquid.   Eyes: Negative for visual disturbance.  Respiratory: Positive for cough and shortness of breath. Negative for choking, chest tightness and wheezing.   Cardiovascular: Positive for leg swelling. Negative for chest pain.  Gastrointestinal: Negative for abdominal distention and abdominal pain.  Genitourinary: Negative for difficulty urinating, dysuria and urgency.       Incontinent of urine sometimes.   Musculoskeletal: Positive for gait problem. Negative for joint swelling.  Skin: Positive for pallor. Negative for color change.  Neurological: Positive for speech difficulty. Negative for  weakness and headaches.       Difficulties with clear speech  Psychiatric/Behavioral: Positive for confusion. Negative for agitation, behavioral problems and sleep disturbance. The patient is nervous/anxious.     Immunization History  Administered Date(s) Administered  . Influenza Split 11/03/2011, 11/17/2013, 11/25/2014  . Influenza-Unspecified 12/12/2015, 12/19/2015, 12/17/2016  . PPD Test 06/06/2015, 06/20/2015  . Pneumococcal Polysaccharide-23 09/17/2013  . Tdap 07/04/2016   Pertinent  Health Maintenance Due  Topic Date Due  . PNA vac Low Risk Adult (2 of 2 - PCV13) 09/18/2014  . INFLUENZA VACCINE  Completed  . DEXA SCAN  Completed   Fall Risk  10/23/2016 04/11/2015  Falls in the past year? Yes No  Number falls in past yr: 2 or more -  Injury with Fall? Yes -   Functional Status Survey:    Vitals:   04/27/17 1204  BP: (!) 116/52  Pulse: 64  Resp: 17  Temp: 97.6 F (36.4 C)  SpO2: 96%  Weight: 116 lb 12.8 oz (53 kg)  Height: 5\' 4"  (1.626 m)   Body mass index is 20.05 kg/m. Physical Exam  Constitutional: She appears well-developed and well-nourished. No distress.  HENT:  Head: Normocephalic and atraumatic.  Eyes: Conjunctivae and EOM are normal. Pupils are equal, round, and reactive to light.  Neck: Normal range of motion. Neck supple. No JVD present. No thyromegaly present.  Cardiovascular: Normal rate and regular rhythm.  Murmur heard. Pulmonary/Chest: She has no wheezes. She has no rales.  O2 dependent, decreased air entry.   Abdominal: Bowel sounds are normal. She exhibits no distension. There is no tenderness.  Musculoskeletal: She exhibits edema. She exhibits no tenderness.  One person transfer, self propels in wheelchair, trace edema in ankles.   Neurological: She is alert. She exhibits normal muscle tone. Coordination normal.  Oriented to self and her room on unit.   Skin: Skin is warm and dry. No rash noted. She is not diaphoretic. No erythema.    Psychiatric: She has a normal mood and affect. Her behavior is normal.    Labs reviewed: Recent Labs    07/08/16 07/14/16 10/23/16  NA 142 143 141  K 3.1* 3.5 3.8  BUN 16 12 19   CREATININE 0.5 0.5 0.5   Recent Labs    05/29/16 07/08/16 10/23/16  AST 21 25 15   ALT 21 24 13   ALKPHOS 84 89 77   Recent Labs    01/20/17 01/27/17  03/03/17 03/10/17 03/17/17  WBC 8.3 10.6   < > 9.7 6.9 11.7  HGB 26.0* 9.4*   < > 8.5* 7.9* 8.2*  HCT 72* 30*   < > 27* 258* 26*  PLT 253 296  --   --   --  281   < > = values in this interval not displayed.   Lab Results  Component Value Date   TSH 2.01 01/01/2016   No results found for: HGBA1C Lab Results  Component Value Date   CHOL 260 (H) 04/11/2015   HDL 92.50 04/11/2015   LDLCALC 148 (H) 04/11/2015   LDLDIRECT 189.2 12/12/2008   TRIG 98.0 04/11/2015   CHOLHDL 3 04/11/2015    Significant Diagnostic Results in last 30 days:  No results found.  Assessment/Plan COPD GOLD II  resides in SNF [facility], self propels wheelchair to get around, one person transfer, incontinent of urine sometimes, under Hospice service for supportive care. Hx of COPD, chronic cough, continue  Prednisone 5mg  daily, O2 dependent, prn q2h Morphine 5mg , DuoNeb 4x/day, prn q4h Guaifenesin 10ml po, Benzonatate 100mg  tid po.   Anxiety and depression Her mood is managed, continue Alprazolam 0.5mg  qid po.   Edema Continue Furosemide 20mg  daily, only trace edema in ankles.    Iron deficiency anemia Continue Fe 325mg  bid, last Hgb 8.3 03/31/17, update CBC     Family/ staff Communication: plan of care reviewed with the patient and charge nurse.   Labs/tests ordered: CBC  Time spend 25 minutes.

## 2017-04-27 NOTE — Assessment & Plan Note (Signed)
Continue Fe 325mg  bid, last Hgb 8.3 03/31/17, update CBC

## 2017-04-27 NOTE — Assessment & Plan Note (Signed)
Her mood is managed, continue Alprazolam 0.5mg  qid po.

## 2017-04-27 NOTE — Assessment & Plan Note (Signed)
[facility], self propels wheelchair to get around, one person transfer, incontinent of urine sometimes, under Hospice service for supportive care. Hx of COPD, chronic cough, continue  Prednisone 5mg  daily, O2 dependent, prn q2h Morphine 5mg , DuoNeb 4x/day, prn q4h Guaifenesin 10ml po, Benzonatate 100mg  tid po.

## 2017-04-27 NOTE — Assessment & Plan Note (Signed)
Continue Furosemide 20mg  daily, only trace edema in ankles.

## 2017-04-28 DIAGNOSIS — D509 Iron deficiency anemia, unspecified: Secondary | ICD-10-CM | POA: Diagnosis not present

## 2017-04-28 LAB — CBC AND DIFFERENTIAL
HEMATOCRIT: 26 — AB (ref 36–46)
HEMOGLOBIN: 8.2 — AB (ref 12.0–16.0)
PLATELETS: 328 (ref 150–399)
WBC: 11.9

## 2017-05-12 ENCOUNTER — Non-Acute Institutional Stay (SKILLED_NURSING_FACILITY): Payer: Medicare Other | Admitting: Nurse Practitioner

## 2017-05-12 ENCOUNTER — Encounter: Payer: Self-pay | Admitting: Nurse Practitioner

## 2017-05-12 DIAGNOSIS — Z7189 Other specified counseling: Secondary | ICD-10-CM

## 2017-05-12 DIAGNOSIS — J9611 Chronic respiratory failure with hypoxia: Secondary | ICD-10-CM | POA: Diagnosis not present

## 2017-05-12 NOTE — Assessment & Plan Note (Signed)
advanced COPD, O2 dependent among other chronic medical conditions. She is under Hospice service, resides in SNF, self propels wheelchair to get around.  Continue Prednisone 5mg  qd, Morphine 5mg  q2h prn, DuoNeb qid, Guaifenesin 10ml q4h prn, Benzonatate tid.

## 2017-05-12 NOTE — Progress Notes (Signed)
Location:  [facility] Room Number: 31 Place of Service:  SNF (31) Provider:  Rakesha Dalporto, Manxie  NP  Blanchie Serve, MD  Patient Care Team: Blanchie Serve, MD as PCP - General (Internal Medicine) Clance, Armando Reichert, MD as Consulting Physician (Pulmonary Disease) Frederik Pear, MD as Consulting Physician (Orthopedic Surgery) Kristeen Miss, MD as Consulting Physician (Neurosurgery) Jeda Pardue X, NP as Nurse Practitioner (Internal Medicine)  Extended Emergency Contact Information Primary Emergency Contact: Wetzel Bjornstad Address: [place], [place] Phone: [phone] Relation: Daughter Secondary Emergency Contact: Nadara Mustard [place] of [place] Mobile Phone: [phone] Relation: Relative  Code Status:  DNR Goals of care: Advanced Directive information Advanced Directives 04/27/2017  Does Patient Have a Medical Advance Directive? Yes  Type of Paramedic of [place];Living will;Out of facility DNR (pink MOST or yellow form)  Does patient want to make changes to medical advance directive? No - Patient declined  Copy of [place] in Chart? Yes  Would patient like information on creating a medical advance directive? -  Pre-existing out of facility DNR order (yellow form or pink MOST form) Yellow form placed in chart (order not valid for inpatient use)     Chief Complaint  Patient presents with  . Acute Visit    ACP Meeting    HPI:  Pt is a 84 y.o. female seen today for an acute visit for goals of care discussion. The patient has history of advanced COPD, O2 dependent among other chronic medical conditions. She is under Hospice service, resides in SNF, self propels wheelchair to get around.     Past Medical History:  Diagnosis Date  . Alcohol abuse   . Anemia   . Anxiety   . Aortic stenosis    moderate on echo 09/2013  . Cervical spondylosis with  myelopathy 01/2009   s/p decompression  . COPD (chronic obstructive pulmonary disease) ([place])   . Dementia 09/06/2015  . Depression   . Fall at home 05/2015  . Heart murmur   . History of blood transfusion 01/02/2016   "anemia"  . Hypertension   . On home oxygen therapy    "2L; 24/7" (01/02/2016)  . Osteoporosis    (R) hip fx 11/2009 & (L) hip fx 2006  . Speech impediment    present for >9 months; no one has really been able to discern an etiology/daughter/notes 01/01/2016   Past Surgical History:  Procedure Laterality Date  . BACK SURGERY    . BLADDER SUSPENSION     "unsuccessful"  . CATARACT EXTRACTION W/ INTRAOCULAR LENS  IMPLANT, BILATERAL Bilateral 06/06/13 - 2016   "right - left"  . FRACTURE SURGERY    . HIP FRACTURE SURGERY Left 1980s  . LAPAROSCOPIC CHOLECYSTECTOMY  1995  . ORIF HIP FRACTURE Right 11/2009   Done by Dr. Mayer Camel  . POSTERIOR LAMINECTOMY / DECOMPRESSION CERVICAL SPINE  01/2009   Done by Dr. Ellene Route  . WRIST FRACTURE SURGERY Bilateral    "one in [place]; one [place]"    Allergies  Allergen Reactions  . Ativan [Lorazepam] Other (See Comments)    Causes increased confusion/AMS    Outpatient Encounter Medications as of 05/12/2017  Medication Sig  . acetaminophen (TYLENOL) 500 MG tablet Take 1,000 mg by mouth 3 (three) times daily as needed.   . ALPRAZolam (XANAX) 0.5 MG tablet Take 0.5 mg by mouth 4 (four) times daily.   Marland Kitchen  benzonatate (TESSALON) 100 MG capsule Take 1 capsule by mouth 3 (three) times daily. Give at 8 am, 2 pm and 8 pm  . ferrous sulfate 325 (65 FE) MG EC tablet Take 325 mg 2 (two) times daily by mouth.  . furosemide (LASIX) 20 MG tablet Take 20 mg by mouth daily.  Marland Kitchen guaifenesin (ROBITUSSIN) 100 MG/5ML syrup Take by mouth. Take 10 ml every 4 hours as needed for cough  . ipratropium-albuterol (DUONEB) 0.5-2.5 (3) MG/3ML SOLN Take 3 mLs by nebulization. Use nebulizer four times daily  . loperamide (IMODIUM) 2 MG capsule 1 tab po bid prn diarrhea    . Melatonin 5 MG TABS Take 5 mg by mouth at bedtime.   Marland Kitchen morphine (ROXANOL) 20 MG/ML concentrated solution Place 5 mg under the tongue every 2 (two) hours as needed for severe pain or shortness of breath.  . OXYGEN Inhale 2 L/min into the lungs continuous.  . pantoprazole (PROTONIX) 20 MG tablet Take 20 mg by mouth daily.  . potassium chloride (K-DUR) 10 MEQ tablet Take 20 mEq by mouth daily.  . predniSONE (DELTASONE) 5 MG tablet Take 5 mg by mouth daily with breakfast.   . sertraline (ZOLOFT) 25 MG tablet Take 25 mg by mouth at bedtime.  . vitamin B-12 (CYANOCOBALAMIN) 1000 MCG tablet Take 1,000 mcg by mouth daily.   No facility-administered encounter medications on file as of 05/12/2017.    ROS was provided with assistance of staff.  Review of Systems  Constitutional: Negative for activity change, appetite change, chills, diaphoresis, fatigue and fever.  Respiratory: Positive for cough and shortness of breath. Negative for choking, chest tightness and wheezing.   Cardiovascular: Positive for leg swelling. Negative for chest pain and palpitations.  Musculoskeletal: Positive for gait problem.  Neurological: Positive for speech difficulty. Negative for weakness and headaches.       Difficult to understand, hesitant, guttural  Psychiatric/Behavioral: Positive for agitation and behavioral problems. Negative for hallucinations and sleep disturbance. The patient is nervous/anxious.     Immunization History  Administered Date(s) Administered  . Influenza Split 11/03/2011, 11/17/2013, 11/25/2014  . Influenza-Unspecified 12/12/2015, 12/19/2015, 12/17/2016  . PPD Test 06/06/2015, 06/20/2015  . Pneumococcal Polysaccharide-23 09/17/2013  . Tdap 07/04/2016   Pertinent  Health Maintenance Due  Topic Date Due  . PNA vac Low Risk Adult (2 of 2 - PCV13) 09/18/2014  . INFLUENZA VACCINE  Completed  . DEXA SCAN  Completed   Fall Risk  10/23/2016 04/11/2015  Falls in the past year? Yes No  Number  falls in past yr: 2 or more -  Injury with Fall? Yes -   Functional Status Survey:    Vitals:   05/12/17 1119  BP: 140/74  Pulse: 84  Resp: 20  Temp: 98.4 F (36.9 C)  SpO2: 96%  Weight: 116 lb 12.8 oz (53 kg)  Height: 5' 4"  (1.626 m)   Body mass index is 20.05 kg/m. Physical Exam  Constitutional: She appears well-developed and well-nourished.  HENT:  Head: Normocephalic and atraumatic.  Eyes: Conjunctivae and EOM are normal. Pupils are equal, round, and reactive to light.  Neck: No JVD present. No thyromegaly present.  Cardiovascular: Normal rate.  Murmur heard. Pulmonary/Chest: No respiratory distress. She has no wheezes. She has rales.  Decreased air entry. O2 dependent. Rales appreciated posterior lower lungs.   Musculoskeletal: She exhibits edema.  Trace edema in BLE. Wheelchair to get around.   Neurological: She is alert. She exhibits normal muscle tone. Coordination normal.  Oriented  to person and place.   Skin: Skin is warm and dry.  Psychiatric: Her behavior is normal.  Appears anxious.     Labs reviewed: Recent Labs    07/08/16 07/14/16 10/23/16  NA 142 143 141  K 3.1* 3.5 3.8  BUN 16 12 19   CREATININE 0.5 0.5 0.5   Recent Labs    05/29/16 07/08/16 10/23/16  AST 21 25 15   ALT 21 24 13   ALKPHOS 84 89 77   Recent Labs    01/27/17  03/10/17 03/17/17 04/28/17  WBC 10.6   < > 6.9 11.7 11.9  HGB 9.4*   < > 7.9* 8.2* 8.2*  HCT 30*   < > 258* 26* 26*  PLT 296  --   --  281 328   < > = values in this interval not displayed.   Lab Results  Component Value Date   TSH 2.01 01/01/2016   No results found for: HGBA1C Lab Results  Component Value Date   CHOL 260 (H) 04/11/2015   HDL 92.50 04/11/2015   LDLCALC 148 (H) 04/11/2015   LDLDIRECT 189.2 12/12/2008   TRIG 98.0 04/11/2015   CHOLHDL 3 04/11/2015    Significant Diagnostic Results in last 30 days:  No results found.  Assessment/Plan Counseling regarding advanced care planning and goals of  care Goals of care discussion: reviewed goals of care with patient and her POA daughter. Social worker present. DNR when patient has no pulse and is not breathing updated. Went over and filled out MOST between 3:00PM to 3:25pm. Patient would like to be DNR. She would like to comfort measures, don't transfer to hospital unless comfort needs cannot be met in current location. She desires antibiotics for determine use when infection occurs, IV fluids for a defined trial period.  No feeding tube. Form signed by the patient's representative daughter, social worker, and myself. Copies made for chart and family.   Chronic respiratory failure with hypoxia (HCC) advanced COPD, O2 dependent among other chronic medical conditions. She is under Hospice service, resides in SNF, self propels wheelchair to get around.  Continue Prednisone 7m qd, Morphine 554mq2h prn, DuoNeb qid, Guaifenesin 1016m4h prn, Benzonatate tid.       Family/ staff Communication: plan of care reviewed with the patient, the patient's POA, social worker, and chaCamera operator Labs/tests ordered:  None  Time spend 25 minutes.

## 2017-05-12 NOTE — Assessment & Plan Note (Addendum)
Goals of care discussion: reviewed goals of care with patient and her POA daughter. Social worker present. DNR when patient has no pulse and is not breathing updated. Went over and filled out MOST between 3:00PM to 3:25pm. Patient would like to be DNR. She would like to comfort measures, don't transfer to hospital unless comfort needs cannot be met in current location. She desires antibiotics for determine use when infection occurs, IV fluids for a defined trial period.  No feeding tube. Form signed by the patient's representative daughter, social worker, and myself. Copies made for chart and family.

## 2017-05-26 ENCOUNTER — Non-Acute Institutional Stay (SKILLED_NURSING_FACILITY): Payer: Medicare Other | Admitting: Internal Medicine

## 2017-05-26 ENCOUNTER — Encounter: Payer: Self-pay | Admitting: Internal Medicine

## 2017-05-26 DIAGNOSIS — J9611 Chronic respiratory failure with hypoxia: Secondary | ICD-10-CM | POA: Diagnosis not present

## 2017-05-26 DIAGNOSIS — I352 Nonrheumatic aortic (valve) stenosis with insufficiency: Secondary | ICD-10-CM | POA: Diagnosis not present

## 2017-05-26 DIAGNOSIS — F411 Generalized anxiety disorder: Secondary | ICD-10-CM

## 2017-05-26 DIAGNOSIS — I359 Nonrheumatic aortic valve disorder, unspecified: Secondary | ICD-10-CM | POA: Diagnosis not present

## 2017-05-26 DIAGNOSIS — I10 Essential (primary) hypertension: Secondary | ICD-10-CM | POA: Diagnosis not present

## 2017-05-26 DIAGNOSIS — G4709 Other insomnia: Secondary | ICD-10-CM | POA: Diagnosis not present

## 2017-05-26 DIAGNOSIS — K219 Gastro-esophageal reflux disease without esophagitis: Secondary | ICD-10-CM | POA: Diagnosis not present

## 2017-05-26 DIAGNOSIS — I509 Heart failure, unspecified: Secondary | ICD-10-CM | POA: Diagnosis not present

## 2017-05-26 DIAGNOSIS — D638 Anemia in other chronic diseases classified elsewhere: Secondary | ICD-10-CM

## 2017-05-26 DIAGNOSIS — R0902 Hypoxemia: Secondary | ICD-10-CM | POA: Diagnosis not present

## 2017-05-26 DIAGNOSIS — J441 Chronic obstructive pulmonary disease with (acute) exacerbation: Secondary | ICD-10-CM | POA: Diagnosis not present

## 2017-05-26 NOTE — Progress Notes (Signed)
[facility] Room Number: 31 Place of Service:  SNF (31) Provider:  Oneal Grout MD  Oneal Grout, MD  Patient Care Team: Oneal Grout, MD as PCP - General (Internal Medicine) Clance, Maree Krabbe, MD as Consulting Physician (Pulmonary Disease) Gean Birchwood, MD as Consulting Physician (Orthopedic Surgery) Barnett Abu, MD as Consulting Physician (Neurosurgery) Mast, Man X, NP as Nurse Practitioner (Internal Medicine)  Extended Emergency Contact Information Primary Emergency Contact: Sandi Mariscal Address: [address]          [place], [place] Phone: [phone] Relation: Daughter Secondary Emergency Contact: Royston Cowper [place] of [place] Mobile Phone: [phone] Relation: Relative  Code Status:  DNR  Goals of care: Advanced Directive information Advanced Directives 05/26/2017  Does Patient Have a Medical Advance Directive? Yes  Type of Estate agent of [place];Living will;Out of facility DNR (pink MOST or yellow form)  Does patient want to make changes to medical advance directive? No - Patient declined  Copy of Healthcare Power of Attorney in Chart? Yes  Would patient like information on creating a medical advance directive? -  Pre-existing out of facility DNR order (yellow form or pink MOST form) Yellow form placed in chart (order not valid for inpatient use);Pink MOST form placed in chart (order not valid for inpatient use)     Chief Complaint  Patient presents with  . Medical Management of Chronic Issues    Routine Visit     HPI:  Pt is a 84 y.o. female seen today for medical management of chronic diseases.  She has dysarthria and expressive aphasia. She has difficulty participating in HPI and ROS. No acute concern from nursing. Patient needs assistance with her ADLs like toileting and transfer. She ambulates with her wheelchair. She can self propel. No fall reported. She is on  nectar thick liquid. She attends activities of choice.   Chronic respiratory failure- with COPD and severe aortic stenosis. currently on oxygen by nasal canula, chronic prednisone, has dyspnea with exertion, ambulates with wheelchair. No recent exacerbation. Takes duoneb qid and morphine 5 mg q2h prn dyspnea  Insomnia- sleeps fine per nursing, takes melatonin  Anemia of chronic disease- Takes iron sulfate 325 mg bid and vitamin b12 supplement.   GAD- mood stable. On sertraline 25 mg qhs  gerd- takes pantoprazole, no vomiting per nursing, appetite fair   Past Medical History:  Diagnosis Date  . Alcohol abuse   . Anemia   . Anxiety   . Aortic stenosis    moderate on echo 09/2013  . Cervical spondylosis with myelopathy 01/2009   s/p decompression  . COPD (chronic obstructive pulmonary disease) (HCC)   . Dementia 09/06/2015  . Depression   . Fall at home 05/2015  . Heart murmur   . History of blood transfusion 01/02/2016   "anemia"  . Hypertension   . On home oxygen therapy    "2L; 24/7" (01/02/2016)  . Osteoporosis    (R) hip fx 11/2009 & (L) hip fx 2006  . Speech impediment    present for >9 months; no one has really been able to discern an etiology/daughter/notes 01/01/2016   Past Surgical History:  Procedure Laterality Date  . BACK SURGERY    . BLADDER SUSPENSION     "unsuccessful"  . CATARACT EXTRACTION W/ INTRAOCULAR LENS  IMPLANT, BILATERAL Bilateral 06/06/13 - 2016   "right - left"  . FRACTURE SURGERY    . HIP FRACTURE SURGERY Left 1980s  .  LAPAROSCOPIC CHOLECYSTECTOMY  1995  . ORIF HIP FRACTURE Right 11/2009   Done by Dr. Turner Daniels  . POSTERIOR LAMINECTOMY / DECOMPRESSION CERVICAL SPINE  01/2009   Done by Dr. Danielle Dess  . WRIST FRACTURE SURGERY Bilateral    "one in [place]; one [place]"    Allergies  Allergen Reactions  . Ativan [Lorazepam] Other (See Comments)    Causes increased confusion/AMS    Outpatient Encounter Medications as of 05/26/2017  Medication Sig  .  acetaminophen (TYLENOL) 500 MG tablet Take 1,000 mg by mouth 3 (three) times daily as needed.   . ALPRAZolam (XANAX) 0.5 MG tablet Take 0.5 mg by mouth 4 (four) times daily.   . benzonatate (TESSALON) 100 MG capsule Take 1 capsule by mouth 3 (three) times daily. Give at 8 am, 2 pm and 8 pm  . feeding supplement (BOOST HIGH PROTEIN) LIQD Take 1 Container by mouth 2 (two) times daily.  . ferrous sulfate 325 (65 FE) MG EC tablet Take 325 mg 2 (two) times daily by mouth.  . furosemide (LASIX) 20 MG tablet Take 20 mg by mouth daily.  Marland Kitchen guaifenesin (ROBITUSSIN) 100 MG/5ML syrup Take by mouth. Take 10 ml every 4 hours as needed for cough  . ipratropium-albuterol (DUONEB) 0.5-2.5 (3) MG/3ML SOLN Take 3 mLs by nebulization 4 (four) times daily.   Marland Kitchen loperamide (IMODIUM) 2 MG capsule 1 tab po bid prn diarrhea  . Melatonin 5 MG TABS Take 5 mg by mouth at bedtime.   Marland Kitchen morphine (ROXANOL) 20 MG/ML concentrated solution Place 5 mg under the tongue every 2 (two) hours as needed for severe pain or shortness of breath.  . OXYGEN Inhale 2 L/min into the lungs continuous.  . pantoprazole (PROTONIX) 20 MG tablet Take 20 mg by mouth daily.  . potassium chloride (K-DUR) 10 MEQ tablet Take 20 mEq by mouth daily.  . predniSONE (DELTASONE) 5 MG tablet Take 5 mg by mouth daily with breakfast.   . sertraline (ZOLOFT) 25 MG tablet Take 25 mg by mouth at bedtime.  . vitamin B-12 (CYANOCOBALAMIN) 1000 MCG tablet Take 1,000 mcg by mouth daily.   No facility-administered encounter medications on file as of 05/26/2017.     Review of Systems  Unable to perform ROS: Other    Immunization History  Administered Date(s) Administered  . Influenza Split 11/03/2011, 11/17/2013, 11/25/2014  . Influenza-Unspecified 12/12/2015, 12/19/2015, 12/17/2016  . PPD Test 06/06/2015, 06/20/2015  . Pneumococcal Polysaccharide-23 09/17/2013  . Tdap 07/04/2016   Pertinent  Health Maintenance Due  Topic Date Due  . PNA vac Low Risk Adult (2  of 2 - PCV13) 09/18/2014  . INFLUENZA VACCINE  09/24/2017  . DEXA SCAN  Completed   Fall Risk  10/23/2016 04/11/2015  Falls in the past year? Yes No  Number falls in past yr: 2 or more -  Injury with Fall? Yes -   Functional Status Survey:    Vitals:   05/26/17 1512  BP: 112/60  Pulse: 94  Resp: 20  Temp: 98.2 F (36.8 C)  TempSrc: Oral  SpO2: 96%  Weight: 117 lb 14.4 oz (53.5 kg)  Height: 5\' 4"  (1.626 m)   Body mass index is 20.24 kg/m.   Wt Readings from Last 3 Encounters:  05/26/17 117 lb 14.4 oz (53.5 kg)  05/12/17 116 lb 12.8 oz (53 kg)  04/27/17 116 lb 12.8 oz (53 kg)   Physical Exam  Constitutional: No distress.  Thin built and frail elderly female  HENT:  Head:  Normocephalic and atraumatic.  Right Ear: External ear normal.  Left Ear: External ear normal.  Mouth/Throat: Oropharynx is clear and moist.  Eyes: Pupils are equal, round, and reactive to light. Conjunctivae are normal. Right eye exhibits no discharge. Left eye exhibits no discharge.  Neck: Neck supple.  Cardiovascular: Normal rate, regular rhythm and intact distal pulses.  Pulmonary/Chest: Effort normal.  Poor air movement to lung bases, no wheeze or rales, on 2 liter o2 by nasal canula  Abdominal: Soft. Bowel sounds are normal. There is no tenderness. There is no guarding.  Musculoskeletal: She exhibits edema.  Trace feet edema  Lymphadenopathy:    She has no cervical adenopathy.  Neurological: She is alert.  Oriented to person  Skin: Skin is warm and dry. No rash noted. She is not diaphoretic.  Psychiatric: She has a normal mood and affect.    Labs reviewed: Recent Labs    07/08/16 07/14/16 10/23/16  NA 142 143 141  K 3.1* 3.5 3.8  BUN 16 12 19   CREATININE 0.5 0.5 0.5   Recent Labs    05/29/16 07/08/16 10/23/16  AST 21 25 15   ALT 21 24 13   ALKPHOS 84 89 77   Recent Labs    01/27/17  03/10/17 03/17/17 04/28/17  WBC 10.6   < > 6.9 11.7 11.9  HGB 9.4*   < > 7.9* 8.2* 8.2*  HCT  30*   < > 258* 26* 26*  PLT 296  --   --  281 328   < > = values in this interval not displayed.   Lab Results  Component Value Date   TSH 2.01 01/01/2016   No results found for: HGBA1C Lab Results  Component Value Date   CHOL 260 (H) 04/11/2015   HDL 92.50 04/11/2015   LDLCALC 148 (H) 04/11/2015   LDLDIRECT 189.2 12/12/2008   TRIG 98.0 04/11/2015   CHOLHDL 3 04/11/2015    Significant Diagnostic Results in last 30 days:  No results found.  Assessment/Plan  1. Chronic respiratory failure with hypoxia (HCC) Continue oxygen, duoneb, prn morphine and daily prednisone.   2. Gastroesophageal reflux disease without esophagitis Unable to assess for symptom. No vomiting reported. Has been on chronic prednisone and has low h&h, continue PPI  3. Other insomnia C/w melatonin  4. GAD (generalized anxiety disorder) Continue sertraline and monitor  5. Anemia of chronic disease Decrease ferrous sulfate to 325 mg once a day from bid. Continue b12 supplement. Reviewed h&h.   Family/ staff Communication: reviewed care plan with patient and charge nurse.    Labs/tests ordered:  none   Oneal GroutMAHIMA Berneice Zettlemoyer, MD Internal Medicine [facility] [address] [place], [place] [address] Cell Phone (Monday-Friday 8 am - 5 pm): [phone] On Call: [phone] and follow prompts after 5 pm and on weekends Office Phone: [phone] Office Fax: [phone]

## 2017-06-24 ENCOUNTER — Non-Acute Institutional Stay (SKILLED_NURSING_FACILITY): Payer: Medicare Other | Admitting: Nurse Practitioner

## 2017-06-24 ENCOUNTER — Encounter: Payer: Self-pay | Admitting: Nurse Practitioner

## 2017-06-24 DIAGNOSIS — J449 Chronic obstructive pulmonary disease, unspecified: Secondary | ICD-10-CM

## 2017-06-24 DIAGNOSIS — R609 Edema, unspecified: Secondary | ICD-10-CM | POA: Diagnosis not present

## 2017-06-24 DIAGNOSIS — F411 Generalized anxiety disorder: Secondary | ICD-10-CM

## 2017-06-24 DIAGNOSIS — K219 Gastro-esophageal reflux disease without esophagitis: Secondary | ICD-10-CM | POA: Diagnosis not present

## 2017-06-24 DIAGNOSIS — D509 Iron deficiency anemia, unspecified: Secondary | ICD-10-CM

## 2017-06-24 NOTE — Assessment & Plan Note (Signed)
COPD, maintained on O2 2lpm via [place], Prednisone  qd, Morphine  q2h prn, DuoNeb qid, prn Guaifenesin 10ml q4h, Benzonatate  tid.

## 2017-06-24 NOTE — Assessment & Plan Note (Signed)
anemia, last Hgb 7.2, continue  Fe, Vit B12, she is under Hospice service for comfort measures.

## 2017-06-24 NOTE — Progress Notes (Signed)
Location:  [facility] Room Number: 31 Place of Service:  SNF ([phone]) Provider:  Breslyn Abdo, ManXie  NP  Oneal Grout, MD  Patient Care Team: Oneal Grout, MD as PCP - General (Internal Medicine) Clance, Maree Krabbe, MD as Consulting Physician (Pulmonary Disease) Gean Birchwood, MD as Consulting Physician (Orthopedic Surgery) Barnett Abu, MD as Consulting Physician (Neurosurgery) Sakiyah Shur X, NP as Nurse Practitioner (Internal Medicine)  Extended Emergency Contact Information Primary Emergency Contact: Sandi Mariscal Address: [address]          [place], [place] Phone: [phone] Relation: Daughter Secondary Emergency Contact: Royston Cowper [place] Mobile Phone: [phone] Relation: Relative  Code Status:  DNR Goals of care: Advanced Directive information Advanced Directives 06/24/2017  Does Patient Have a Medical Advance Directive? Yes  Type of Estate agent of [place];Living will;Out of facility DNR (pink MOST or yellow form)  Does patient want to make changes to medical advance directive? No - Patient declined  Copy of Healthcare Power of Attorney in Chart? Yes  Would patient like information on creating a medical advance directive? -  Pre-existing out of facility DNR order (yellow form or pink MOST form) Yellow form placed in chart (order not valid for inpatient use)     Chief Complaint  Patient presents with  . Medical Management of Chronic Issues    F/u- chronic respirtory failure    HPI:  Pt is a 84 y.o. female seen today for medical management of chronic diseases.    The patient has history of anemia, last Hgb 7.2, on Fe, Vit B12, she takes Pantoprazole  qd for GERD. Her mood is managed with Sertraline , Alprazolam 0.5mg  qid. COPD, maintained on O2 2lpm via [place], Prednisone  qd, Morphine  q2h prn, DuoNeb qid, prn Guaifenesin 10ml q4h, Benzonatate   tid. Trace edema BLE seen in BLE while on Furosemide  qd.   Past Medical History:  Diagnosis Date  . Alcohol abuse   . Anemia   . Anxiety   . Aortic stenosis    moderate on echo 09/2013  . Cervical spondylosis with myelopathy 01/2009   s/p decompression  . COPD (chronic obstructive pulmonary disease) (HCC)   . Dementia 09/06/2015  . Depression   . Fall at home 05/2015  . Heart murmur   . History of blood transfusion 01/02/2016   "anemia"  . Hypertension   . On home oxygen therapy    "2L; 24/7" (01/02/2016)  . Osteoporosis    (R) hip fx 11/2009 & (L) hip fx 2006  . Speech impediment    present for >9 months; no one has really been able to discern an etiology/daughter/notes 01/01/2016   Past Surgical History:  Procedure Laterality Date  . BACK SURGERY    . BLADDER SUSPENSION     "unsuccessful"  . CATARACT EXTRACTION W/ INTRAOCULAR LENS  IMPLANT, BILATERAL Bilateral 06/06/13 - 2016   "right - left"  . FRACTURE SURGERY    . HIP FRACTURE SURGERY Left 1980s  . LAPAROSCOPIC CHOLECYSTECTOMY  1995  . ORIF HIP FRACTURE Right 11/2009   Done by Dr. Turner Daniels  . POSTERIOR LAMINECTOMY / DECOMPRESSION CERVICAL SPINE  01/2009   Done by Dr. Danielle Dess  . WRIST FRACTURE SURGERY Bilateral    "one in [place]; one [place]"    Allergies  Allergen Reactions  . Ativan [Lorazepam] Other (See Comments)    Causes increased confusion/AMS    Outpatient Encounter Medications as of  06/24/2017  Medication Sig  . acetaminophen (TYLENOL) 500 MG tablet Take 1,000 mg by mouth 3 (three) times daily as needed.   . ALPRAZolam (XANAX) 0.5 MG tablet Take 0.5 mg by mouth 4 (four) times daily.   . benzonatate (TESSALON) 100 MG capsule Take 1 capsule by mouth 3 (three) times daily. Give at 8 am, 2 pm and 8 pm  . feeding supplement (BOOST HIGH PROTEIN) LIQD Take 1 Container by mouth 2 (two) times daily.  . ferrous sulfate 325 (65 FE) MG EC tablet Take 325 mg 2 (two) times daily by mouth.  . furosemide (LASIX) 20 MG  tablet Take 20 mg by mouth daily.  Marland Kitchen guaifenesin (ROBITUSSIN) 100 MG/5ML syrup Take by mouth. Take 10 ml every 4 hours as needed for cough  . ipratropium-albuterol (DUONEB) 0.5-2.5 (3) MG/3ML SOLN Take 3 mLs by nebulization 4 (four) times daily.   Marland Kitchen loperamide (IMODIUM) 2 MG capsule 1 tab po bid prn diarrhea  . Melatonin 5 MG TABS Take 5 mg by mouth at bedtime.   Marland Kitchen morphine (ROXANOL) 20 MG/ML concentrated solution Place 5 mg under the tongue every 2 (two) hours as needed for severe pain or shortness of breath.  . OXYGEN Inhale 2 L/min into the lungs continuous.  . pantoprazole (PROTONIX) 20 MG tablet Take 20 mg by mouth daily.  . potassium chloride (K-DUR) 10 MEQ tablet Take 20 mEq by mouth daily.  . predniSONE (DELTASONE) 5 MG tablet Take 5 mg by mouth daily with breakfast.   . sertraline (ZOLOFT) 25 MG tablet Take 25 mg by mouth at bedtime.  . vitamin B-12 (CYANOCOBALAMIN) 1000 MCG tablet Take 1,000 mcg by mouth daily.   No facility-administered encounter medications on file as of 06/24/2017.    ROS was provided with assistance of staff Review of Systems  Constitutional: Negative for activity change, appetite change, chills, diaphoresis, fatigue and fever.  HENT: Positive for hearing loss. Negative for congestion, trouble swallowing and voice change.   Respiratory: Positive for cough and shortness of breath. Negative for choking, chest tightness and wheezing.   Cardiovascular: Positive for leg swelling. Negative for chest pain and palpitations.  Gastrointestinal: Negative for abdominal distention, abdominal pain, constipation, diarrhea, nausea and vomiting.  Genitourinary: Negative for difficulty urinating, dysuria and urgency.  Musculoskeletal: Positive for gait problem.  Skin: Positive for pallor. Negative for wound.  Neurological: Negative for speech difficulty and weakness.       Memory lapses.   Psychiatric/Behavioral: Negative for agitation, behavioral problems, confusion,  hallucinations and sleep disturbance. The patient is nervous/anxious.     Immunization History  Administered Date(s) Administered  . Influenza Split 11/03/2011, 11/17/2013, 11/25/2014  . Influenza-Unspecified 12/12/2015, 12/19/2015, 12/17/2016  . PPD Test 06/06/2015, 06/20/2015  . Pneumococcal Polysaccharide-23 09/17/2013  . Tdap 07/04/2016   Pertinent  Health Maintenance Due  Topic Date Due  . PNA vac Low Risk Adult (2 of 2 - PCV13) 09/18/2014  . INFLUENZA VACCINE  09/24/2017  . DEXA SCAN  Completed   Fall Risk  10/23/2016 04/11/2015  Falls in the past year? Yes No  Number falls in past yr: 2 or more -  Injury with Fall? Yes -   Functional Status Survey:    Vitals:   06/24/17 1203  BP: 138/66  Pulse: 86  Resp: (!) 22  Temp: 97.8 F (36.6 C)  SpO2: 95%  Weight: 117 lb 14.4 oz (53.5 kg)  Height:  (1.626 m)   Body mass index is 20.24 kg/m. Physical Exam  Constitutional: She appears well-developed and well-nourished.  HENT:  Head: Normocephalic and atraumatic.  Eyes: Pupils are equal, round, and reactive to light. EOM are normal.  Neck: Normal range of motion. Neck supple. No JVD present. No thyromegaly present.  Cardiovascular: Normal rate and regular rhythm.  Murmur heard. Pulmonary/Chest: She has no wheezes. She has rales.  O2 2lpm via [place], decreased air entry, bibasilar rales.   Abdominal: Soft. Bowel sounds are normal. She exhibits no distension. There is no tenderness.  Musculoskeletal: She exhibits edema.  Self transfer, w/c to get around, trace edema BLE  Neurological: She is alert. She exhibits normal muscle tone. Coordination normal.  Oriented to person and her room on unit. Memory lapses.   Skin: Skin is warm and dry. There is pallor.  Psychiatric: She has a normal mood and affect. Her behavior is normal.    Labs reviewed: Recent Labs    07/08/16 07/14/16 10/23/16  NA 142 143 141  K 3.1* 3.5 3.8  BUN CREATININE 0.5 0.5 0.5   Recent  Labs    07/08/16 10/23/16  AST 25 15  ALT 24 13  ALKPHOS 89 77   Recent Labs    01/27/17  03/10/17 03/17/17 04/28/17  WBC 10.6   < > 6.9 11.7 11.9  HGB 9.4*   < > 7.9* 8.2* 8.2*  HCT 30*   < > 258* 26* 26*  PLT 296  --   --  281 328   < > = values in this interval not displayed.   Lab Results  Component Value Date   TSH 2.01 01/01/2016   No results found for: HGBA1C Lab Results  Component Value Date   CHOL 260 (H) 04/11/2015   HDL 92.50 04/11/2015   LDLCALC 148 (H) 04/11/2015   LDLDIRECT 189.2 12/12/2008   TRIG 98.0 04/11/2015   CHOLHDL 3 04/11/2015    Significant Diagnostic Results in last 30 days:  No results found.  Assessment/Plan Edema Trace edema BLE seen in BLE, continue  Furosemide  qd.   Iron deficiency anemia anemia, last Hgb 7.2, continue  Fe, Vit B12, she is under Hospice service for comfort measures.   GERD (gastroesophageal reflux disease) Continue  Pantoprazole  qd for GERD.  Anxiety state Her mood is managed, continue Sertraline , Alprazolam 0.5mg  qid.  COPD GOLD II   COPD, maintained on O2 2lpm via [place], Prednisone  qd, Morphine  q2h prn, DuoNeb qid, prn Guaifenesin 10ml q4h, Benzonatate  tid.     Family/ staff Communication: plan of care reviewed with the patient and charge nurse.   Labs/tests ordered:  none  Time spend 25 minutes.

## 2017-06-24 NOTE — Assessment & Plan Note (Signed)
Continue  Pantoprazole  qd for GERD.

## 2017-06-24 NOTE — Assessment & Plan Note (Signed)
Her mood is managed, continue Sertraline , Alprazolam 0.5mg  qid.

## 2017-06-24 NOTE — Assessment & Plan Note (Signed)
Trace edema BLE seen in BLE, continue  Furosemide  qd.

## 2017-06-25 DIAGNOSIS — J441 Chronic obstructive pulmonary disease with (acute) exacerbation: Secondary | ICD-10-CM | POA: Diagnosis not present

## 2017-06-25 DIAGNOSIS — I509 Heart failure, unspecified: Secondary | ICD-10-CM | POA: Diagnosis not present

## 2017-06-25 DIAGNOSIS — I359 Nonrheumatic aortic valve disorder, unspecified: Secondary | ICD-10-CM | POA: Diagnosis not present

## 2017-06-25 DIAGNOSIS — I10 Essential (primary) hypertension: Secondary | ICD-10-CM | POA: Diagnosis not present

## 2017-06-25 DIAGNOSIS — I352 Nonrheumatic aortic (valve) stenosis with insufficiency: Secondary | ICD-10-CM | POA: Diagnosis not present

## 2017-07-28 DIAGNOSIS — I352 Nonrheumatic aortic (valve) stenosis with insufficiency: Secondary | ICD-10-CM | POA: Diagnosis not present

## 2017-07-28 DIAGNOSIS — I509 Heart failure, unspecified: Secondary | ICD-10-CM | POA: Diagnosis not present

## 2017-07-28 DIAGNOSIS — I359 Nonrheumatic aortic valve disorder, unspecified: Secondary | ICD-10-CM | POA: Diagnosis not present

## 2017-07-29 ENCOUNTER — Encounter: Payer: Self-pay | Admitting: Nurse Practitioner

## 2017-07-29 ENCOUNTER — Non-Acute Institutional Stay (SKILLED_NURSING_FACILITY): Payer: Medicare Other | Admitting: Nurse Practitioner

## 2017-07-29 DIAGNOSIS — D509 Iron deficiency anemia, unspecified: Secondary | ICD-10-CM

## 2017-07-29 DIAGNOSIS — J449 Chronic obstructive pulmonary disease, unspecified: Secondary | ICD-10-CM | POA: Diagnosis not present

## 2017-07-29 DIAGNOSIS — R609 Edema, unspecified: Secondary | ICD-10-CM

## 2017-07-29 DIAGNOSIS — F419 Anxiety disorder, unspecified: Secondary | ICD-10-CM | POA: Diagnosis not present

## 2017-07-29 DIAGNOSIS — F329 Major depressive disorder, single episode, unspecified: Secondary | ICD-10-CM

## 2017-07-29 NOTE — Assessment & Plan Note (Signed)
Her mood is stable, continue  Sertraline 25mg qd, Alprazolam 0.5mg qid.  

## 2017-07-29 NOTE — Progress Notes (Addendum)
Location:  [facility] Room Number: 31 Place of Service:  SNF ([phone]) Provider:  Rendi Mapel, ManXie  NP  Oneal Grout, MD  Patient Care Team: Oneal Grout, MD as PCP - General (Internal Medicine) Clance, Maree Krabbe, MD as Consulting Physician (Pulmonary Disease) Gean Birchwood, MD as Consulting Physician (Orthopedic Surgery) Barnett Abu, MD as Consulting Physician (Neurosurgery) Analea Muller X, NP as Nurse Practitioner (Internal Medicine)  Extended Emergency Contact Information Primary Emergency Contact: Sandi Mariscal Address: [address]          [place], [place] Phone: [phone] Relation: Daughter Secondary Emergency Contact: Royston Cowper [place] Mobile Phone: [phone] Relation: Relative  Code Status:  DNR Goals of care: Advanced Directive information Advanced Directives 07/29/2017  Does Patient Have a Medical Advance Directive? Yes  Type of Estate agent of [place];Living will;Out of facility DNR (pink MOST or yellow form)  Does patient want to make changes to medical advance directive? No - Patient declined  Copy of Healthcare Power of Attorney in Chart? Yes  Would patient like information on creating a medical advance directive? -  Pre-existing out of facility DNR order (yellow form or pink MOST form) Yellow form placed in chart (order not valid for inpatient use);Pink MOST form placed in chart (order not valid for inpatient use)     Chief Complaint  Patient presents with  . Medical Management of Chronic Issues    F/U- COPD, edema, GERD, anxiety,     HPI:  Pt is a 84 y.o. female seen today for medical management of chronic diseases.      The patient has chronic anemia, on Fe and Vit B12, no apparent bleeding, Hgb around stared dropping from 9.8 07/29/16 to 8.0 12/30/16, then stayed around 8 , last Hgb 7.6 07/28/17. Her goal of care is comfort measures, she is currently  under Hospice service. Her mood is stable on Sertraline 25mg  qd, Alprazolam 0.5mg  qid. COPD stable on Prednisone 5mg  qd, prn Morphine 5mg  q2h, DuoNeb qid, Benzonatate 100mg  tid. No apparent swelling in BLE, on Furosemide 20mg  qd.    Past Medical History:  Diagnosis Date  . Alcohol abuse   . Anemia   . Anxiety   . Aortic stenosis    moderate on echo 09/2013  . Cervical spondylosis with myelopathy 01/2009   s/p decompression  . COPD (chronic obstructive pulmonary disease) (HCC)   . Dementia 09/06/2015  . Depression   . Fall at home 05/2015  . Heart murmur   . History of blood transfusion 01/02/2016   "anemia"  . Hypertension   . On home oxygen therapy    "2L; 24/7" (01/02/2016)  . Osteoporosis    (R) hip fx 11/2009 & (L) hip fx 2006  . Speech impediment    present for >9 months; no one has really been able to discern an etiology/daughter/notes 01/01/2016   Past Surgical History:  Procedure Laterality Date  . BACK SURGERY    . BLADDER SUSPENSION     "unsuccessful"  . CATARACT EXTRACTION W/ INTRAOCULAR LENS  IMPLANT, BILATERAL Bilateral 06/06/13 - 2016   "right - left"  . FRACTURE SURGERY    . HIP FRACTURE SURGERY Left 1980s  . LAPAROSCOPIC CHOLECYSTECTOMY  1995  . ORIF HIP FRACTURE Right 11/2009   Done by Dr. Turner Daniels  . POSTERIOR LAMINECTOMY / DECOMPRESSION CERVICAL SPINE  01/2009   Done by Dr. Danielle Dess  . WRIST FRACTURE SURGERY Bilateral    [place]  [facility]"    Allergies  Allergen Reactions  . Ativan [Lorazepam] Other (See Comments)    Causes increased confusion/AMS    Outpatient Encounter Medications as of 07/29/2017  Medication Sig  . acetaminophen (TYLENOL) 500 MG tablet Take 1,000 mg by mouth 3 (three) times daily as needed.   . ALPRAZolam (XANAX) 0.5 MG tablet Take 0.5 mg by mouth 4 (four) times daily.   . benzonatate (TESSALON) 100 MG capsule Take 1 capsule by mouth 3 (three) times daily. Give at 8 am, 2 pm and 8 pm  . feeding supplement (BOOST HIGH PROTEIN)  LIQD Take 1 Container by mouth 2 (two) times daily.  . ferrous sulfate 325 (65 FE) MG EC tablet Take 325 mg by mouth daily.   . furosemide (LASIX) 20 MG tablet Take 20 mg by mouth daily.  Marland Kitchen. guaifenesin (ROBITUSSIN) 100 MG/5ML syrup Take by mouth. Take 10 ml every 4 hours as needed for cough  . ipratropium-albuterol (DUONEB) 0.5-2.5 (3) MG/3ML SOLN Take 3 mLs by nebulization 4 (four) times daily.   Marland Kitchen. loperamide (IMODIUM) 2 MG capsule 1 tab po bid prn diarrhea  . Melatonin 5 MG TABS Take 5 mg by mouth at bedtime.   Marland Kitchen. morphine (ROXANOL) 20 MG/ML concentrated solution Place 5 mg under the tongue every 2 (two) hours as needed for severe pain or shortness of breath.  . OXYGEN Inhale 2 L/min into the lungs continuous.  . pantoprazole (PROTONIX) 20 MG tablet Take 20 mg by mouth daily.  . potassium chloride (K-DUR) 10 MEQ tablet Take 20 mEq by mouth daily.  . predniSONE (DELTASONE) 5 MG tablet Take 5 mg by mouth daily with breakfast.   . sertraline (ZOLOFT) 25 MG tablet Take 25 mg by mouth at bedtime.  . vitamin B-12 (CYANOCOBALAMIN) 1000 MCG tablet Take 1,000 mcg by mouth daily.   No facility-administered encounter medications on file as of 07/29/2017.    ROS was provided with assistance of staff Review of Systems  Constitutional: Positive for fatigue. Negative for activity change, appetite change, chills, diaphoresis and fever.  HENT: Positive for hearing loss. Negative for congestion, trouble swallowing and voice change.   Respiratory: Positive for cough and shortness of breath. Negative for chest tightness and wheezing.        O2 dependent  Cardiovascular: Positive for leg swelling. Negative for chest pain and palpitations.  Gastrointestinal: Negative for abdominal distention, abdominal pain, constipation, diarrhea, nausea and vomiting.  Genitourinary: Negative for difficulty urinating and urgency.  Musculoskeletal: Positive for gait problem.  Skin: Positive for pallor. Negative for wound.    Neurological: Positive for speech difficulty. Negative for dizziness, weakness and headaches.       Memory lapses.   Psychiatric/Behavioral: Positive for confusion. Negative for agitation, behavioral problems and sleep disturbance. The patient is nervous/anxious.     Immunization History  Administered Date(s) Administered  . Influenza Split 11/03/2011, 11/17/2013, 11/25/2014  . Influenza-Unspecified 12/12/2015, 12/19/2015, 12/17/2016  . PPD Test 06/06/2015, 06/20/2015  . Pneumococcal Polysaccharide-23 09/17/2013  . Tdap 07/04/2016   Pertinent  Health Maintenance Due  Topic Date Due  . PNA vac Low Risk Adult (2 of 2 - PCV13) 09/18/2014  . INFLUENZA VACCINE  09/24/2017  . DEXA SCAN  Completed   Fall Risk  10/23/2016 04/11/2015  Falls in the past year? Yes No  Number falls in past yr: 2 or more -  Injury with Fall? Yes -   Functional Status Survey:    Vitals:   07/29/17  1221  BP: 126/60  Pulse: 95  Resp: (!) 24  Temp: 98.2 F (36.8 C)  SpO2: 95%  Weight: 111 lb 6.4 oz (50.5 kg)  Height: 5\' 4"  (1.626 m)   Body mass index is 19.12 kg/m. Physical Exam  Constitutional: She appears well-developed and well-nourished.  HENT:  Head: Normocephalic and atraumatic.  Eyes: Pupils are equal, round, and reactive to light. EOM are normal. No scleral icterus.  Neck: Normal range of motion. Neck supple. No JVD present. No thyromegaly present.  Cardiovascular: Normal rate and regular rhythm.  Murmur heard. Pulmonary/Chest: She has no wheezes. She has rales.  Decreased air entry. O2 dependent. Posterior bibasilar rales.  Abdominal: Soft. Bowel sounds are normal. She exhibits no distension. There is no tenderness. There is no rebound and no guarding.  Neurological: She is alert. No cranial nerve deficit. She exhibits normal muscle tone. Coordination normal.  Oriented to person and place.   Skin: Skin is warm. There is pallor.  Psychiatric: She has a normal mood and affect. Her behavior  is normal.    Labs reviewed: Recent Labs    10/23/16  NA 141  K 3.8  BUN 19  CREATININE 0.5   Recent Labs    10/23/16  AST 15  ALT 13  ALKPHOS 77   Recent Labs    03/17/17 04/28/17 08/04/17 08/06/17  WBC 11.7 11.9  --  13.9  HGB 8.2* 8.2* 7.0* 6.9*  HCT 26* 26* 24* 23*  PLT 281 328 376 372   Lab Results  Component Value Date   TSH 2.01 01/01/2016   No results found for: HGBA1C Lab Results  Component Value Date   CHOL 260 (H) 04/11/2015   HDL 92.50 04/11/2015   LDLCALC 148 (H) 04/11/2015   LDLDIRECT 189.2 12/12/2008   TRIG 98.0 04/11/2015   CHOLHDL 3 04/11/2015    Significant Diagnostic Results in last 30 days:  No results found.  Assessment/Plan Iron deficiency anemia chronic anemia, continue Fe and Vit B12, no apparent bleeding, Hgb around stared dropping from 9.8 07/29/16 to 8.0 12/30/16, then stayed around 8 , last Hgb 7.6 07/28/17. Update CBC iron, Ferritin, Retic count in one week.   08/04/17 wbc 12.1, Hgb 7.0, plt 376, Iron <10, ferritin 28, increase Fe 325mg  bid, CBC 08/06/17. 08/06/17 wbc 13.9, Hgb 6.9, plt 372, the patient's doesn't desire blood transfusion, hematology evaluation, workups/treatment per Hospice, she is under Hospice service.   Anxiety and depression Her mood is stable, continue Sertraline 25mg  qd, Alprazolam 0.5mg  qid.   COPD GOLD II  COPD stable, continue O2 via [place], Prednisone 5mg  qd, prn Morphine 5mg  q2h, DuoNeb qid, Benzonatate 100mg  tid.  Edema No apparent swelling in BLE, continue  Furosemide 20mg  qd.     Family/ staff Communication:plan of care reviewed with the patient and charge nurse.   Labs/tests ordered:  CBC iron, Ferritin, Retic count in one week  Time spend 25 minutes.

## 2017-07-29 NOTE — Assessment & Plan Note (Addendum)
No apparent swelling in BLE, continue  Furosemide 20mg  qd.

## 2017-07-29 NOTE — Assessment & Plan Note (Addendum)
chronic anemia, continue Fe and Vit B12, no apparent bleeding, Hgb around stared dropping from 9.8 07/29/16 to 8.0 12/30/16, then stayed around 8 , last Hgb 7.6 07/28/17. Update CBC iron, Ferritin, Retic count in one week.   08/04/17 wbc 12.1, Hgb 7.0, plt 376, Iron <10, ferritin 28, increase Fe 325mg  bid, CBC 08/06/17. 08/06/17 wbc 13.9, Hgb 6.9, plt 372, the patient's doesn't desire blood transfusion, hematology evaluation, workups/treatment per Hospice, she is under Hospice service.

## 2017-07-29 NOTE — Assessment & Plan Note (Signed)
COPD stable, continue O2 via [place], Prednisone 5mg  qd, prn Morphine 5mg  q2h, DuoNeb qid, Benzonatate 100mg  tid.

## 2017-08-04 DIAGNOSIS — D509 Iron deficiency anemia, unspecified: Secondary | ICD-10-CM | POA: Diagnosis not present

## 2017-08-04 LAB — IRON,TIBC AND FERRITIN PANEL
Ferritin: 28
Iron: 10.9

## 2017-08-04 LAB — CBC AND DIFFERENTIAL
HCT: 24 — AB (ref 36–46)
HEMOGLOBIN: 7 — AB (ref 12.0–16.0)
Platelets: 376 (ref 150–399)

## 2017-08-05 ENCOUNTER — Other Ambulatory Visit: Payer: Self-pay | Admitting: *Deleted

## 2017-08-06 DIAGNOSIS — D509 Iron deficiency anemia, unspecified: Secondary | ICD-10-CM | POA: Diagnosis not present

## 2017-08-06 LAB — CBC AND DIFFERENTIAL
HEMATOCRIT: 23 — AB (ref 36–46)
Hemoglobin: 6.9 — AB (ref 12.0–16.0)
Platelets: 372 (ref 150–399)
WBC: 13.9

## 2017-08-07 ENCOUNTER — Other Ambulatory Visit: Payer: Self-pay | Admitting: *Deleted

## 2017-08-25 ENCOUNTER — Encounter: Payer: Self-pay | Admitting: Internal Medicine

## 2017-08-25 ENCOUNTER — Non-Acute Institutional Stay (SKILLED_NURSING_FACILITY): Payer: Medicare Other | Admitting: Internal Medicine

## 2017-08-25 DIAGNOSIS — I509 Heart failure, unspecified: Secondary | ICD-10-CM | POA: Diagnosis not present

## 2017-08-25 DIAGNOSIS — D62 Acute posthemorrhagic anemia: Secondary | ICD-10-CM | POA: Diagnosis not present

## 2017-08-25 DIAGNOSIS — R627 Adult failure to thrive: Secondary | ICD-10-CM

## 2017-08-25 DIAGNOSIS — I352 Nonrheumatic aortic (valve) stenosis with insufficiency: Secondary | ICD-10-CM | POA: Diagnosis not present

## 2017-08-25 DIAGNOSIS — I359 Nonrheumatic aortic valve disorder, unspecified: Secondary | ICD-10-CM | POA: Diagnosis not present

## 2017-08-25 DIAGNOSIS — J9611 Chronic respiratory failure with hypoxia: Secondary | ICD-10-CM | POA: Diagnosis not present

## 2017-08-25 DIAGNOSIS — J441 Chronic obstructive pulmonary disease with (acute) exacerbation: Secondary | ICD-10-CM | POA: Diagnosis not present

## 2017-08-25 DIAGNOSIS — K219 Gastro-esophageal reflux disease without esophagitis: Secondary | ICD-10-CM

## 2017-08-25 DIAGNOSIS — D500 Iron deficiency anemia secondary to blood loss (chronic): Secondary | ICD-10-CM | POA: Diagnosis not present

## 2017-08-25 DIAGNOSIS — I10 Essential (primary) hypertension: Secondary | ICD-10-CM | POA: Diagnosis not present

## 2017-08-25 DIAGNOSIS — I350 Nonrheumatic aortic (valve) stenosis: Secondary | ICD-10-CM | POA: Diagnosis not present

## 2017-08-25 LAB — CBC AND DIFFERENTIAL
HEMATOCRIT: 19 — AB (ref 36–46)
Hemoglobin: 5.3 — AB (ref 12.0–16.0)
Platelets: 357 (ref 150–399)
WBC: 11.9

## 2017-08-25 NOTE — Progress Notes (Signed)
Location:  [facility] Room Number: 31 Place of Service:  SNF [phone]) Provider:  Oneal GroutMahima Wilkins Elpers MD  Oneal GroutPandey, Eldene Plocher, MD  Patient Care Team: Oneal GroutPandey, Ysmael Hires, MD as PCP - General (Internal Medicine) Clance, Maree KrabbeKeith M, MD as Consulting Physician (Pulmonary Disease) Gean Birchwoodowan, Frank, MD as Consulting Physician (Orthopedic Surgery) Barnett AbuElsner, Henry, MD as Consulting Physician (Neurosurgery) Mast, Man X, NP as Nurse Practitioner (Internal Medicine)  Extended Emergency Contact Information Primary Emergency Contact: Sandi Mariscaluellar,Kristina Address: [address]          [place], [place] Mobile Phone: [phone] Relation: Daughter Secondary Emergency Contact: Royston Cowperuellar,Oscar  [place] Mobile Phone: [phone] Relation: Relative  Code Status:  DNR/ hospice  Goals of care: Advanced Directive information Advanced Directives 08/25/2017  Does Patient Have a Medical Advance Directive? Yes  Type of Estate agentAdvance Directive Healthcare Power of [place];Living will;Out of facility DNR (pink MOST or yellow form)  Does patient want to make changes to medical advance directive? No - Patient declined  Copy of Healthcare Power of Attorney in Chart? Yes  Would patient like information on creating a medical advance directive? -  Pre-existing out of facility DNR order (yellow form or pink MOST form) Yellow form placed in chart (order not valid for inpatient use);Pink MOST form placed in chart (order not valid for inpatient use)     Chief Complaint  Patient presents with  . Medical Management of Chronic Issues    Routine Visit     HPI:  Pt is a 84 y.o. female seen today for medical management of chronic diseases. She is followed by hospice services. She has medical history of chronic respiratory failure and is on chronic oxygen. She has severe aortic stenosis and COPD. She appears air hungry at rest and is dyspneic with minimal exertion. She does not  participate in HPI and ROS. She has anemia of chronic disease and is on iron and b12 supplement. She had lab work with drop in hemoglobin to 5.3 today from 8. No active bleed reported by nursing. Per family and hospice team no further workup is desired including lab work. Unable to obtain HPI and ROS from patient. She is ambulating by wheeling herself in her wheelchair.   Past Medical History:  Diagnosis Date  . Alcohol abuse   . Anemia   . Anxiety   . Aortic stenosis    moderate on echo 09/2013  . Cervical spondylosis with myelopathy 01/2009   s/p decompression  . COPD (chronic obstructive pulmonary disease) (HCC)   . Dementia 09/06/2015  . Depression   . Fall at home 05/2015  . Heart murmur   . History of blood transfusion 01/02/2016   "anemia"  . Hypertension   . On home oxygen therapy    "2L; 24/7" (01/02/2016)  . Osteoporosis    (R) hip fx 11/2009 & (L) hip fx 2006  . Speech impediment    present for >9 months; no one has really been able to discern an etiology/daughter/notes 01/01/2016   Past Surgical History:  Procedure Laterality Date  . BACK SURGERY    . BLADDER SUSPENSION     "unsuccessful"  . CATARACT EXTRACTION W/ INTRAOCULAR LENS  IMPLANT, BILATERAL Bilateral 06/06/13 - 2016   "right - left"  . FRACTURE SURGERY    . HIP FRACTURE SURGERY Left 1980s  . LAPAROSCOPIC CHOLECYSTECTOMY  1995  . ORIF HIP FRACTURE Right 11/2009   Done by Dr. Turner Danielsowan  . POSTERIOR LAMINECTOMY / DECOMPRESSION CERVICAL  SPINE  01/2009   Done by Dr. Danielle Dess  . WRIST FRACTURE SURGERY Bilateral    "one in [place]; one [place]"    Allergies  Allergen Reactions  . Ativan [Lorazepam] Other (See Comments)    Causes increased confusion/AMS    Outpatient Encounter Medications as of 08/25/2017  Medication Sig  . acetaminophen (TYLENOL) 500 MG tablet Take 1,000 mg by mouth 3 (three) times daily as needed.   . ALPRAZolam (XANAX) 0.5 MG tablet Take 0.5 mg by mouth 4 (four) times daily.   . benzonatate  (TESSALON) 100 MG capsule Take 1 capsule by mouth 3 (three) times daily. Give at 8 am, 2 pm and 8 pm  . feeding supplement (BOOST HIGH PROTEIN) LIQD Take 1 Container by mouth 3 (three) times daily.   . ferrous sulfate 325 (65 FE) MG EC tablet Take 325 mg by mouth 2 (two) times daily.   . furosemide (LASIX) 20 MG tablet Take 20 mg by mouth daily.  Marland Kitchen ipratropium-albuterol (DUONEB) 0.5-2.5 (3) MG/3ML SOLN Take 3 mLs by nebulization 4 (four) times daily.   Marland Kitchen loperamide (IMODIUM) 2 MG capsule 1 tab po bid prn diarrhea  . Melatonin 5 MG TABS Take 5 mg by mouth at bedtime.   Marland Kitchen morphine (ROXANOL) 20 MG/ML concentrated solution Place 5 mg under the tongue every 2 (two) hours as needed for severe pain or shortness of breath.  . OXYGEN Inhale 2 L/min into the lungs continuous.  . pantoprazole (PROTONIX) 20 MG tablet Take 20 mg by mouth daily.  . potassium chloride (K-DUR) 10 MEQ tablet Take 20 mEq by mouth daily.  . predniSONE (DELTASONE) 5 MG tablet Take 5 mg by mouth daily with breakfast.   . senna (SENOKOT) 8.6 MG TABS tablet Take 1 tablet by mouth at bedtime.  . sertraline (ZOLOFT) 25 MG tablet Take 25 mg by mouth at bedtime.  . vitamin B-12 (CYANOCOBALAMIN) 1000 MCG tablet Take 1,000 mcg by mouth daily.  . [DISCONTINUED] guaifenesin (ROBITUSSIN) 100 MG/5ML syrup Take by mouth. Take 10 ml every 4 hours as needed for cough   No facility-administered encounter medications on file as of 08/25/2017.     Review of Systems  Unable to perform ROS: Dementia    Immunization History  Administered Date(s) Administered  . Influenza Split 11/03/2011, 11/17/2013, 11/25/2014  . Influenza-Unspecified 12/12/2015, 12/19/2015, 12/17/2016  . PPD Test 06/06/2015, 06/20/2015  . Pneumococcal Polysaccharide-23 09/17/2013  . Tdap 07/04/2016   Pertinent  Health Maintenance Due  Topic Date Due  . PNA vac Low Risk Adult (2 of 2 - PCV13) 09/18/2014  . INFLUENZA VACCINE  09/24/2017  . DEXA SCAN  Completed   Fall Risk   10/23/2016 04/11/2015  Falls in the past year? Yes No  Number falls in past yr: 2 or more -  Injury with Fall? Yes -   Functional Status Survey:    Vitals:   08/25/17 1443  BP: 122/72  Pulse: 76  Resp: 20  Temp: 98.8 F (37.1 C)  TempSrc: Oral  SpO2: 95%  Weight: 106 lb 14.4 oz (48.5 kg)  Height: 5\' 4"  (1.626 m)   Body mass index is 18.35 kg/m.   Wt Readings from Last 3 Encounters:  08/25/17 106 lb 14.4 oz (48.5 kg)  07/29/17 111 lb 6.4 oz (50.5 kg)  06/24/17 117 lb 14.4 oz (53.5 kg)   Physical Exam  Constitutional:  Thin built, frail, elderly female in no distress  HENT:  Head: Normocephalic and atraumatic.  Mouth/Throat: Oropharynx  is clear and moist.  Eyes: Pupils are equal, round, and reactive to light. Conjunctivae and EOM are normal. Right eye exhibits no discharge. Left eye exhibits no discharge.  Neck: Normal range of motion. Neck supple.  Cardiovascular: Normal rate.  Pulmonary/Chest:  Poor air movement, increased effort with breathing, on oxygen by nasal canula  Abdominal: Soft. Bowel sounds are normal. There is no tenderness.  Musculoskeletal: She exhibits edema.  Can move all 4 extremities, needs assistance with her ADLs and transfer, gets around in her wheelchair  Lymphadenopathy:    She has no cervical adenopathy.  Neurological:  Alert and oriented to self  Skin: Skin is warm and dry. She is not diaphoretic.    Labs reviewed: Recent Labs    10/23/16  NA 141  K 3.8  BUN 19  CREATININE 0.5   Recent Labs    10/23/16  AST 15  ALT 13  ALKPHOS 77   Recent Labs    04/28/17 08/04/17 08/06/17 08/25/17  WBC 11.9  --  13.9 11.9  HGB 8.2* 7.0* 6.9* 5.3*  HCT 26* 24* 23* 19*  PLT 328 376 372 357   Lab Results  Component Value Date   TSH 2.01 01/01/2016   No results found for: HGBA1C Lab Results  Component Value Date   CHOL 260 (H) 04/11/2015   HDL 92.50 04/11/2015   LDLCALC 148 (H) 04/11/2015   LDLDIRECT 189.2 12/12/2008   TRIG 98.0  04/11/2015   CHOLHDL 3 04/11/2015    Significant Diagnostic Results in last 30 days:  No results found.  Assessment/Plan  1. Chronic respiratory failure with hypoxia (HCC) With acute blood loss anemia, copd and severe AS. Continue oxygen by nasal canula and alprazolam 0.5 mg qid with morphine 5 mg q2h prn dyspnea. Hospice service on board. Continue chronic prednisone  2. Essential hypertension Stable, supportive care and continue lasix.  3. Aortic stenosis, severe With dyspnea, supportive care with o2 by nasal canula and morphine 5 mg q2h prn dyspnea.   4. Gastroesophageal reflux disease, esophagitis presence not specified Continue pantoprazole for now  5. Failure to thrive in adult Supportive care. Anticipate worsening of her medical condition.   6. Acute blood loss anemia With drop in hemoglobin to 5.3. Has severe Aortic stenosis and chronic respiratory failure and her anemia will likely worsen this. No further lab work. Goal of care is for comfort measures. Oxygen by nasal canula, morphine prn for dyspnea.    Family/ staff Communication: reviewed care plan with patient, hospice nurse and charge nurse.    Labs/tests ordered:  No further lab work per family request/ reviewed with hospice nurse.    Oneal Grout, MD Internal Medicine [facility] [address] [place], [place] [address] Cell Phone (Monday-Friday 8 am - 5 pm): [phone] On Call: [phone] and follow prompts after 5 pm and on weekends Office Phone: [phone] Office Fax: [phone]

## 2017-09-23 ENCOUNTER — Other Ambulatory Visit: Payer: Self-pay

## 2017-09-23 MED ORDER — MORPHINE SULFATE (CONCENTRATE) 20 MG/ML PO SOLN: 5.0000 mg | ORAL | 0 refills | Status: AC | PRN

## 2017-09-25 ENCOUNTER — Encounter: Payer: Self-pay | Admitting: Nurse Practitioner

## 2017-09-25 ENCOUNTER — Non-Acute Institutional Stay (SKILLED_NURSING_FACILITY): Payer: Medicare Other | Admitting: Internal Medicine

## 2017-09-25 ENCOUNTER — Encounter: Payer: Self-pay | Admitting: Internal Medicine

## 2017-09-25 DIAGNOSIS — S0093XA Contusion of unspecified part of head, initial encounter: Secondary | ICD-10-CM | POA: Diagnosis not present

## 2017-09-25 DIAGNOSIS — F411 Generalized anxiety disorder: Secondary | ICD-10-CM | POA: Diagnosis not present

## 2017-09-25 DIAGNOSIS — W19XXXA Unspecified fall, initial encounter: Secondary | ICD-10-CM | POA: Diagnosis not present

## 2017-09-25 DIAGNOSIS — R609 Edema, unspecified: Secondary | ICD-10-CM

## 2017-09-25 DIAGNOSIS — J9611 Chronic respiratory failure with hypoxia: Secondary | ICD-10-CM | POA: Diagnosis not present

## 2017-09-25 NOTE — Progress Notes (Signed)
Location:  [facility] Room Number: 31 Place of Service:  SNF ([phone]) Provider:  Mast, ManXie  NP  Oneal GroutPandey, Mahima, MD  Patient Care Team: Oneal GroutPandey, Mahima, MD as PCP - General (Internal Medicine) Clance, Maree KrabbeKeith M, MD as Consulting Physician (Pulmonary Disease) Gean Birchwoodowan, Frank, MD as Consulting Physician (Orthopedic Surgery) Barnett AbuElsner, Henry, MD as Consulting Physician (Neurosurgery) Mast, Man X, NP as Nurse Practitioner (Internal Medicine)  Extended Emergency Contact Information Primary Emergency Contact: Sandi Mariscaluellar,Kristina Address: [address]          [place], [place] Mobile Phone: [phone] Relation: Daughter Secondary Emergency Contact: Royston Cowperuellar,Oscar  [place] Mobile Phone: [phone] Relation: Relative  Code Status:  DNR Goals of care: Advanced Directive information Advanced Directives 09/25/2017  Does Patient Have a Medical Advance Directive? Yes  Type of Estate agentAdvance Directive Healthcare Power of [place];Living will;Out of facility DNR (pink MOST or yellow form)  Does patient want to make changes to medical advance directive? No - Patient declined  Copy of Healthcare Power of Attorney in Chart? Yes  Would patient like information on creating a medical advance directive? -  Pre-existing out of facility DNR order (yellow form or pink MOST form) Yellow form placed in chart (order not valid for inpatient use);Pink MOST form placed in chart (order not valid for inpatient use)     Chief Complaint  Patient presents with  . Medical Management of Chronic Issues    f/U-HTN, resp. failure, GERD,     HPI:  Pt is a 84 y.o. female seen today for medical management of chronic diseases.     Past Medical History:  Diagnosis Date  . Alcohol abuse   . Anemia   . Anxiety   . Aortic stenosis    moderate on echo 09/2013  . Cervical spondylosis with myelopathy 01/2009   s/p decompression  . COPD (chronic obstructive  pulmonary disease) (HCC)   . Dementia 09/06/2015  . Depression   . Fall at home 05/2015  . Heart murmur   . History of blood transfusion 01/02/2016   "anemia"  . Hypertension   . On home oxygen therapy    "2L; 24/7" (01/02/2016)  . Osteoporosis    (R) hip fx 11/2009 & (L) hip fx 2006  . Speech impediment    present for >9 months; no one has really been able to discern an etiology/daughter/notes 01/01/2016   Past Surgical History:  Procedure Laterality Date  . BACK SURGERY    . BLADDER SUSPENSION     "unsuccessful"  . CATARACT EXTRACTION W/ INTRAOCULAR LENS  IMPLANT, BILATERAL Bilateral 06/06/13 - 2016   "right - left"  . FRACTURE SURGERY    . HIP FRACTURE SURGERY Left 1980s  . LAPAROSCOPIC CHOLECYSTECTOMY  1995  . ORIF HIP FRACTURE Right 11/2009   Done by Dr. Turner Danielsowan  . POSTERIOR LAMINECTOMY / DECOMPRESSION CERVICAL SPINE  01/2009   Done by Dr. Danielle DessElsner  . WRIST FRACTURE [place]    [place]    Allergies  Allergen Reactions  . Ativan [Lorazepam] Other (See Comments)    Causes increased confusion/AMS    Outpatient Encounter Medications as of 09/25/2017  Medication Sig  . acetaminophen (TYLENOL) 500 MG tablet Take 1,000 mg by mouth 3 (three) times daily as needed.   . ALPRAZolam (XANAX) 0.5 MG tablet Take 0.5 mg by mouth 4 (four) times daily.   . benzonatate (TESSALON) 100 MG capsule Take 1 capsule by mouth every  4 (four) hours as needed for cough. Give at 12p, 4p, 8p  . ferrous sulfate 325 (65 FE) MG EC tablet Take 325 mg by mouth 2 (two) times daily.   . furosemide (LASIX) 20 MG tablet Take 20 mg by mouth daily.  Marland Kitchen ipratropium-albuterol (DUONEB) 0.5-2.5 (3) MG/3ML SOLN Take 3 mLs by nebulization 4 (four) times daily.   Marland Kitchen loperamide (IMODIUM) 2 MG capsule 1 tab po bid prn diarrhea  . Melatonin 5 MG TABS Take 5 mg by mouth at bedtime.   Marland Kitchen morphine (ROXANOL) 20 MG/ML concentrated solution Place 0.25 mLs (5 mg total) under the tongue every 2 (two) hours  as needed for severe pain or shortness of breath.  . NON FORMULARY HONEY THICK MIGHTY SHAKES 4 OZ FOUR TIMES DAILY (WGHT LOSS)  . OXYGEN Inhale 2 L/min into the lungs continuous.  . pantoprazole (PROTONIX) 20 MG tablet Take 20 mg by mouth daily.  . potassium chloride (K-DUR) 10 MEQ tablet Take 20 mEq by mouth daily.  . predniSONE (DELTASONE) 5 MG tablet Take 5 mg by mouth daily with breakfast.   . senna (SENOKOT) 8.6 MG TABS tablet Take 1 tablet by mouth at bedtime.  . sertraline (ZOLOFT) 25 MG tablet Take 25 mg by mouth at bedtime.  . vitamin B-12 (CYANOCOBALAMIN) 1000 MCG tablet Take 1,000 mcg by mouth daily.  . [DISCONTINUED] feeding supplement (BOOST HIGH PROTEIN) LIQD Take 1 Container by mouth 3 (three) times daily.    No facility-administered encounter medications on file as of 09/25/2017.     Review of Systems  Immunization History  Administered Date(s) Administered  . Influenza Split 11/03/2011, 11/17/2013, 11/25/2014  . Influenza-Unspecified 12/12/2015, 12/19/2015, 12/17/2016  . PPD Test 06/06/2015, 06/20/2015  . Pneumococcal Polysaccharide-23 09/17/2013  . Tdap 07/04/2016   Pertinent  Health Maintenance Due  Topic Date Due  . PNA vac Low Risk Adult (2 of 2 - PCV13) 09/18/2014  . INFLUENZA VACCINE  09/24/2017  . DEXA SCAN  Completed   Fall Risk  10/23/2016 04/11/2015  Falls in the past year? Yes No  Number falls in past yr: 2 or more -  Injury with Fall? Yes -   Functional Status Survey:    Vitals:   09/25/17 0947  BP: 110/60  Pulse: 90  Resp: 20  Temp: 98.7 F (37.1 C)  SpO2: 94%  Weight: 102 lb 14.4 oz (46.7 kg)  Height: 5\' 4"  (1.626 m)   Body mass index is 17.66 kg/m. Physical Exam  Labs reviewed: Recent Labs    10/23/16  NA 141  K 3.8  BUN 19  CREATININE 0.5   Recent Labs    10/23/16  AST 15  ALT 13  ALKPHOS 77   Recent Labs    04/28/17 08/04/17 08/06/17 08/25/17  WBC 11.9  --  13.9 11.9  HGB 8.2* 7.0* 6.9* 5.3*  HCT 26* 24* 23* 19*    PLT 328 376 372 357   Lab Results  Component Value Date   TSH 2.01 01/01/2016   No results found for: HGBA1C Lab Results  Component Value Date   CHOL 260 (H) 04/11/2015   HDL 92.50 04/11/2015   LDLCALC 148 (H) 04/11/2015   LDLDIRECT 189.2 12/12/2008   TRIG 98.0 04/11/2015   CHOLHDL 3 04/11/2015    Significant Diagnostic Results in last 30 days:  No results found.  Assessment/Plan There are no diagnoses linked to this encounter.   Family/ staff Communication:   Labs/tests ordered:     This encounter  was created in error - please disregard.

## 2017-09-25 NOTE — Progress Notes (Signed)
Location:  [facility] Room Number: 31 Place of Service:  SNF ([phone]) Provider:  Oneal Grout, MD  Oneal Grout, MD  Patient Care Team: Oneal Grout, MD as PCP - General (Internal Medicine) Clance, Maree Krabbe, MD as Consulting Physician (Pulmonary Disease) Gean Birchwood, MD as Consulting Physician (Orthopedic Surgery) Barnett Abu, MD as Consulting Physician (Neurosurgery) Mast, Man X, NP as Nurse Practitioner (Internal Medicine)  Extended Emergency Contact Information Primary Emergency Contact: Sandi Mariscal Address: [address]          [place], [place] Phone: [phone] Relation: Daughter Secondary Emergency Contact: Royston Cowper [place] Mobile Phone: [phone] Relation: Relative  Code Status:  DNR  Goals of care: Advanced Directive information Advanced Directives 09/25/2017  Does Patient Have a Medical Advance Directive? Yes  Type of Estate agent of [place];Living will;Out of facility DNR (pink MOST or yellow form)  Does patient want to make changes to medical advance directive? No - Patient declined  Copy of Healthcare Power of Attorney in Chart? Yes  Would patient like information on creating a medical advance directive? -  Pre-existing out of facility DNR order (yellow form or pink MOST form) Yellow form placed in chart (order not valid for inpatient use);Pink MOST form placed in chart (order not valid for inpatient use)     Chief Complaint  Patient presents with  . Acute Visit    Fall    HPI:  Pt is a 84 y.o. female seen today for an acute visit for fall. She had a fall this am. Staff noticed a lump to her right forehead and skin tear to left forearm. She also had a fall on 09/19/17 with bump with hematoma to left forehead and skin tear and another on 09/17/17. She has end stage COPD with respiratory failure and is on continuous oxygen. She remains anxious  and dyspneic. maintaining her oxygen saturation but appears restless. She is followed by hospice services. She continues to bruise easily and frequent falls are making it worse. Her xanax was recently increased from 0.5 mg qid to 0.5 mg six times a day with her anxiety. She is anxious and not participating in HPI and ROS. She is now on geri chair with her risk for falls. No fever reported by nursing. Oral intake is fair. Continues to receive her breathing treatment.    Past Medical History:  Diagnosis Date  . Alcohol abuse   . Anemia   . Anxiety   . Aortic stenosis    moderate on echo 09/2013  . Cervical spondylosis with myelopathy 01/2009   s/p decompression  . COPD (chronic obstructive pulmonary disease) (HCC)   . Dementia 09/06/2015  . Depression   . Fall at home 05/2015  . Heart murmur   . History of blood transfusion 01/02/2016   "anemia"  . Hypertension   . On home oxygen therapy    "2L; 24/7" (01/02/2016)  . Osteoporosis    (R) hip fx 11/2009 & (L) hip fx 2006  . Speech impediment    present for >9 months; no one has really been able to discern an etiology/daughter/notes 01/01/2016   Past Surgical History:  Procedure Laterality Date  . BACK SURGERY    . BLADDER SUSPENSION     "unsuccessful"  . CATARACT EXTRACTION W/ INTRAOCULAR LENS  IMPLANT, BILATERAL Bilateral 06/06/13 - 2016   "right - left"  . FRACTURE SURGERY    . HIP FRACTURE SURGERY Left 1980s  .  LAPAROSCOPIC CHOLECYSTECTOMY  1995  . ORIF HIP FRACTURE Right 11/2009   Done by Dr. Turner Danielsowan  . POSTERIOR LAMINECTOMY / DECOMPRESSION CERVICAL SPINE  01/2009   Done by Dr. Danielle DessElsner  . WRIST FRACTURE [place]    [place]"    Allergies  Allergen Reactions  . Ativan [Lorazepam] Other (See Comments)    Causes increased confusion/AMS    Outpatient Encounter Medications as of 09/25/2017  Medication Sig  . acetaminophen (TYLENOL) 500 MG tablet Take 1,000 mg by mouth 3 (three) times daily as needed.     . ALPRAZolam (XANAX) 0.5 MG tablet Take 0.5 mg by mouth every 4 (four) hours.   . benzonatate (TESSALON) 100 MG capsule Take 1 capsule by mouth 3 (three) times daily. Give at 12p, 4p, 8p  . ferrous sulfate 325 (65 FE) MG EC tablet Take 325 mg by mouth 2 (two) times daily.   . furosemide (LASIX) 20 MG tablet Take 20 mg by mouth daily.  Marland Kitchen. ipratropium-albuterol (DUONEB) 0.5-2.5 (3) MG/3ML SOLN Take 3 mLs by nebulization 4 (four) times daily.   Marland Kitchen. loperamide (IMODIUM) 2 MG capsule 1 tab po bid prn diarrhea  . Melatonin 5 MG TABS Take 5 mg by mouth at bedtime.   Marland Kitchen. morphine (ROXANOL) 20 MG/ML concentrated solution Place 0.25 mLs (5 mg total) under the tongue every 2 (two) hours as needed for severe pain or shortness of breath.  . NON FORMULARY HONEY THICK MIGHTY SHAKES 4 OZ FOUR TIMES DAILY (WGHT LOSS)  . OXYGEN Inhale 2 L/min into the lungs continuous.  . pantoprazole (PROTONIX) 20 MG tablet Take 20 mg by mouth daily.  . potassium chloride (K-DUR) 10 MEQ tablet Take 20 mEq by mouth daily.  . predniSONE (DELTASONE) 5 MG tablet Take 5 mg by mouth daily with breakfast.   . senna (SENOKOT) 8.6 MG TABS tablet Take 1 tablet by mouth at bedtime.  . sertraline (ZOLOFT) 25 MG tablet Take 25 mg by mouth at bedtime.  . vitamin B-12 (CYANOCOBALAMIN) 1000 MCG tablet Take 1,000 mcg by mouth daily.   No facility-administered encounter medications on file as of 09/25/2017.     Review of Systems  Reason unable to perform ROS: difficult to obtain, from nursing and chart.  Constitutional: Negative for chills, diaphoresis and fever.  HENT: Negative for congestion and mouth sores.   Respiratory: Positive for cough and shortness of breath.   Cardiovascular: Negative for chest pain.  Gastrointestinal: Negative for abdominal pain and vomiting.  Genitourinary:       Has incontinence  Musculoskeletal: Positive for gait problem.  Neurological: Negative for headaches.  Psychiatric/Behavioral: The patient is  nervous/anxious.     Immunization History  Administered Date(s) Administered  . Influenza Split 11/03/2011, 11/17/2013, 11/25/2014  . Influenza-Unspecified 12/12/2015, 12/19/2015, 12/17/2016  . PPD Test 06/06/2015, 06/20/2015  . Pneumococcal Polysaccharide-23 09/17/2013  . Tdap 07/04/2016   Pertinent  Health Maintenance Due  Topic Date Due  . PNA vac Low Risk Adult (2 of 2 - PCV13) 09/18/2014  . INFLUENZA VACCINE  09/24/2017  . DEXA SCAN  Completed   Fall Risk  10/23/2016 04/11/2015  Falls in the past year? Yes No  Number falls in past yr: 2 or more -  Injury with Fall? Yes -   Functional Status Survey:    Vitals:   09/25/17 1049  BP: 110/60  Pulse: 90  Resp: 20  Temp: 98.7 F (37.1 C)  TempSrc: Oral  SpO2: 97%  Weight: 102  lb 14.4 oz (46.7 kg)  Height: 5\' 4"  (1.626 m)   Body mass index is 17.66 kg/m. Physical Exam  Constitutional: No distress.  Thin built frail elderly female  HENT:  Head: Normocephalic and atraumatic.  Mouth/Throat: Oropharynx is clear and moist.  Eyes: Pupils are equal, round, and reactive to light. Conjunctivae and EOM are normal. Right eye exhibits no discharge. Left eye exhibits no discharge.  Neck: Normal range of motion. Neck supple.  Cardiovascular:  tachycardic  Pulmonary/Chest:  Shallow breathing, on oxygen by nasal canula, no rhonchi or wheezing, poor air entry  Abdominal: Soft. Bowel sounds are normal. There is no tenderness.  Musculoskeletal:  Generalized weakness, unsteady gait, uses walker for ambulation  Lymphadenopathy:    She has no cervical adenopathy.  Neurological: She is alert.  Oriented to person and place  Skin: Skin is warm and dry. She is not diaphoretic.  Psychiatric:  anxious    Labs reviewed: Recent Labs    10/23/16  NA 141  K 3.8  BUN 19  CREATININE 0.5   Recent Labs    10/23/16  AST 15  ALT 13  ALKPHOS 77   Recent Labs    04/28/17 08/04/17 08/06/17 08/25/17  WBC 11.9  --  13.9 11.9  HGB 8.2*  7.0* 6.9* 5.3*  HCT 26* 24* 23* 19*  PLT 328 376 372 357   Lab Results  Component Value Date   TSH 2.01 01/01/2016   No results found for: HGBA1C Lab Results  Component Value Date   CHOL 260 (H) 04/11/2015   HDL 92.50 04/11/2015   LDLCALC 148 (H) 04/11/2015   LDLDIRECT 189.2 12/12/2008   TRIG 98.0 04/11/2015   CHOLHDL 3 04/11/2015    Significant Diagnostic Results in last 30 days:  No results found.  Assessment/Plan  1. Fall, initial encounter 3 falls in last 9 days. Her anxiolytic was recently increased in frequency, will decrease dosing as below, adjust morhine dosing and increase sertraline to help with her anxiety. Alert and oriented.   2. Traumatic hematoma of head, initial encounter Supportive care with ice pack and pain medication. Monitor neuro checks. Goal of care is for comfort measures.   3. Chronic respiratory failure with hypoxia (HCC) Continue oxygen and breathing treatment. Change morphine from 5 mg q2h prn to 5 mg tid scheduled and 5 mg q3h prn and monitor. Continue follow up by hospice services.   4. GAD (generalized anxiety disorder) Concern for increase in frequency of xanax contributing to fall. Currently on xanax 0.5 mg q4 hour. Change this to 0.5 mg five times a day. Increase sertraline to 50 mg daily. Monitor. If this regimen helps with her anxiety, to reduce scheduled xanax to q6h.   5. Edema, unspecified type Stable, decrease lasix to 10 mg daily and kcl to 10 meq daily from current dosing, monitor clinically   Family/ staff Communication: reviewed care plan with patient and charge nurse.    Labs/tests ordered:  none  Oneal Grout, MD Internal Medicine [facility] [address] [place], [place] [address] Cell Phone (Monday-Friday 8 am - 5 pm): [phone] On Call: [phone] and follow prompts after 5 pm and on weekends Office Phone: [phone] Office Fax: [phone]

## 2017-10-02 ENCOUNTER — Non-Acute Institutional Stay (SKILLED_NURSING_FACILITY): Payer: Medicare Other | Admitting: Nurse Practitioner

## 2017-10-02 ENCOUNTER — Encounter: Payer: Self-pay | Admitting: Nurse Practitioner

## 2017-10-02 DIAGNOSIS — F411 Generalized anxiety disorder: Secondary | ICD-10-CM | POA: Diagnosis not present

## 2017-10-02 DIAGNOSIS — J9611 Chronic respiratory failure with hypoxia: Secondary | ICD-10-CM

## 2017-10-02 DIAGNOSIS — R627 Adult failure to thrive: Secondary | ICD-10-CM

## 2017-10-02 NOTE — Assessment & Plan Note (Signed)
Persisting, progressing, will dc all po meds, scheduled Morphine 5mg  q2h po, Atropine ophthalmic sol 4 gtt SL q4h for comfort measures.

## 2017-10-02 NOTE — Progress Notes (Signed)
Location:  [facility] Room Number: 31 Place of Service:  SNF [phone]) Provider:  [place] Welcome, ManXie  NP  Oneal GroutPandey, Mahima, MD  Patient Care Team: Oneal GroutPandey, Mahima, MD as PCP - General (Internal Medicine) Clance, Maree KrabbeKeith M, MD as Consulting Physician (Pulmonary Disease) Gean Birchwoodowan, Frank, MD as Consulting Physician (Orthopedic Surgery) Barnett AbuElsner, Henry, MD as Consulting Physician (Neurosurgery) Emme Rosenau X, NP as Nurse Practitioner (Internal Medicine)  Extended Emergency Contact Information Primary Emergency Contact: Sandi Mariscaluellar,Kristina Address: [address]          [place], [place] Mobile Phone: [phone] Relation: Daughter Secondary Emergency Contact: Royston Cowperuellar,Oscar  [place] Mobile Phone: [phone] Relation: Relative  Code Status:  DNR Goals of care: Advanced Directive information Advanced Directives 09/25/2017  Does Patient Have a Medical Advance Directive? Yes  Type of Estate agentAdvance Directive Healthcare Power of [place];Living will;Out of facility DNR (pink MOST or yellow form)  Does patient want to make changes to medical advance directive? No - Patient declined  Copy of Healthcare Power of Attorney in Chart? Yes  Would patient like information on creating a medical advance directive? -  Pre-existing out of facility DNR order (yellow form or pink MOST form) Yellow form placed in chart (order not valid for inpatient use);Pink MOST form placed in chart (order not valid for inpatient use)     Chief Complaint  Patient presents with  . Medical Management of Chronic Issues    F/u- Fall, GAD, edema, chronic resp. failure,     HPI:  Pt is a 84 y.o. female seen today for medical management of chronic diseases.     The patient is FTT, progressing, only takes a few meds, she is bed resting in recliner, pale, open eyes when spoke to, on O2 via [place], rattling when tries to talk or move. She has episodes of anxiety, restlessness, on  Xanax.   Past Medical History:  Diagnosis Date  . Alcohol abuse   . Anemia   . Anxiety   . Aortic stenosis    moderate on echo 09/2013  . Cervical spondylosis with myelopathy 01/2009   s/p decompression  . COPD (chronic obstructive pulmonary disease) (HCC)   . Dementia 09/06/2015  . Depression   . Fall at home 05/2015  . Heart murmur   . History of blood transfusion 01/02/2016   "anemia"  . Hypertension   . On home oxygen therapy    "2L; 24/7" (01/02/2016)  . Osteoporosis    (R) hip fx 11/2009 & (L) hip fx 2006  . Speech impediment    present for >9 months; no one has really been able to discern an etiology/daughter/notes 01/01/2016   Past Surgical History:  Procedure Laterality Date  . BACK SURGERY    . BLADDER SUSPENSION     "unsuccessful"  . CATARACT EXTRACTION W/ INTRAOCULAR LENS  IMPLANT, BILATERAL Bilateral 06/06/13 - 2016   "right - left"  . FRACTURE SURGERY    . HIP FRACTURE SURGERY Left 1980s  . LAPAROSCOPIC CHOLECYSTECTOMY  1995  . ORIF HIP FRACTURE Right 11/2009   Done by Dr. Turner Danielsowan  . POSTERIOR LAMINECTOMY / DECOMPRESSION CERVICAL SPINE  01/2009   Done by Dr. Danielle DessElsner  . WRIST FRACTURE SURGERY [place]    [place]"    Allergies  Allergen Reactions  . Ativan [Lorazepam] Other (See Comments)    Causes increased confusion/AMS    Outpatient Encounter Medications as of 10/02/2017  Medication Sig  . ALPRAZolam (  XANAX) 0.5 MG tablet Take 0.5 mg by mouth every 4 (four) hours.   Marland Kitchen atropine 0.08 mg/mL SOLN Give 4 drops under tongue every 4 hours.  Marland Kitchen morphine (ROXANOL) 20 MG/ML concentrated solution Place 0.25 mLs (5 mg total) under the tongue every 2 (two) hours as needed for severe pain or shortness of breath.  . OXYGEN Inhale 2 L/min into the lungs continuous.  . [DISCONTINUED] acetaminophen (TYLENOL) 500 MG tablet Take 1,000 mg by mouth 3 (three) times daily as needed.   . [DISCONTINUED] benzonatate (TESSALON) 100 MG capsule Take 1 capsule by  mouth 3 (three) times daily. Give at 12p, 4p, 8p  . [DISCONTINUED] ferrous sulfate 325 (65 FE) MG EC tablet Take 325 mg by mouth 2 (two) times daily.   . [DISCONTINUED] furosemide (LASIX) 20 MG tablet Take 20 mg by mouth daily.  . [DISCONTINUED] ipratropium-albuterol (DUONEB) 0.5-2.5 (3) MG/3ML SOLN Take 3 mLs by nebulization 4 (four) times daily.   . [DISCONTINUED] loperamide (IMODIUM) 2 MG capsule 1 tab po bid prn diarrhea  . [DISCONTINUED] Melatonin 5 MG TABS Take 5 mg by mouth at bedtime.   . [DISCONTINUED] NON FORMULARY HONEY THICK MIGHTY SHAKES 4 OZ FOUR TIMES DAILY (WGHT LOSS)  . [DISCONTINUED] pantoprazole (PROTONIX) 20 MG tablet Take 20 mg by mouth daily.  . [DISCONTINUED] potassium chloride (K-DUR) 10 MEQ tablet Take 20 mEq by mouth daily.  . [DISCONTINUED] predniSONE (DELTASONE) 5 MG tablet Take 5 mg by mouth daily with breakfast.   . [DISCONTINUED] senna (SENOKOT) 8.6 MG TABS tablet Take 1 tablet by mouth at bedtime.  . [DISCONTINUED] sertraline (ZOLOFT) 25 MG tablet Take 25 mg by mouth at bedtime.  . [DISCONTINUED] vitamin B-12 (CYANOCOBALAMIN) 1000 MCG tablet Take 1,000 mcg by mouth daily.   No facility-administered encounter medications on file as of 10/02/2017.     Review of Systems  Constitutional: Positive for activity change, appetite change and fatigue. Negative for chills, diaphoresis and fever.  HENT: Positive for hearing loss. Negative for congestion and voice change.   Respiratory: Positive for cough and shortness of breath. Negative for wheezing.   Cardiovascular: Negative for chest pain, palpitations and leg swelling.  Gastrointestinal: Negative for abdominal distention, abdominal pain, constipation, diarrhea, nausea and vomiting.  Genitourinary: Negative for difficulty urinating, dysuria and urgency.       Incontinent of urine  Musculoskeletal: Positive for gait problem.  Skin:       Healing right forehead contusion.   Neurological: Positive for speech difficulty.  Negative for dizziness, weakness and headaches.       Dementia  Psychiatric/Behavioral: Positive for agitation, behavioral problems and confusion. Negative for hallucinations and sleep disturbance. The patient is nervous/anxious.     Immunization History  Administered Date(s) Administered  . Influenza Split 11/03/2011, 11/17/2013, 11/25/2014  . Influenza-Unspecified 12/12/2015, 12/19/2015, 12/17/2016  . PPD Test 06/06/2015, 06/20/2015  . Pneumococcal Polysaccharide-23 09/17/2013  . Tdap 07/04/2016   Pertinent  Health Maintenance Due  Topic Date Due  . PNA vac Low Risk Adult (2 of 2 - PCV13) 09/18/2014  . INFLUENZA VACCINE  09/24/2017  . DEXA SCAN  Completed   Fall Risk  10/23/2016 04/11/2015  Falls in the past year? Yes No  Number falls in past yr: 2 or more -  Injury with Fall? Yes -   Functional Status Survey:    Vitals:   10/02/17 1131  BP: 122/62  Pulse: 90  Resp: 18  Temp: 98 F (36.7 C)  SpO2: 94%  Weight: 102  lb 14.4 oz (46.7 kg)  Height: 5\' 4"  (1.626 m)   Body mass index is 17.66 kg/m. Physical Exam  Constitutional: She appears well-developed and well-nourished.  HENT:  Head: Normocephalic.  Eyes: Pupils are equal, round, and reactive to light. EOM are normal.  Neck: Normal range of motion. Neck supple. No JVD present. No thyromegaly present.  Cardiovascular: Normal rate.  Murmur heard. tachycardia  Pulmonary/Chest:  rattling  Abdominal: Soft. Bowel sounds are normal.  Musculoskeletal: She exhibits no edema.  Resting in recliner.   Neurological: She is alert. No cranial nerve deficit. She exhibits normal muscle tone. Coordination normal.  Oriented to self.   Skin: Skin is warm and dry.  Right forehead contusion is healing, no s/s of infection  Psychiatric:  Open eyes when spoke to    Labs reviewed: Recent Labs    10/23/16  NA 141  K 3.8  BUN 19  CREATININE 0.5   Recent Labs    10/23/16  AST 15  ALT 13  ALKPHOS 77   Recent Labs     04/28/17 08/04/17 08/06/17 08/25/17  WBC 11.9  --  13.9 11.9  HGB 8.2* 7.0* 6.9* 5.3*  HCT 26* 24* 23* 19*  PLT 328 376 372 357   Lab Results  Component Value Date   TSH 2.01 01/01/2016   No results found for: HGBA1C Lab Results  Component Value Date   CHOL 260 (H) 04/11/2015   HDL 92.50 04/11/2015   LDLCALC 148 (H) 04/11/2015   LDLDIRECT 189.2 12/12/2008   TRIG 98.0 04/11/2015   CHOLHDL 3 04/11/2015    Significant Diagnostic Results in last 30 days:  No results found.  Assessment/Plan Failure to thrive in adult Persisting, progressing, will dc all po meds, scheduled Morphine 5mg  q2h po, Atropine ophthalmic sol 4 gtt SL q4h for comfort measures.   Anxiety state Persists, trial of Xanax 0.5mg  q4h po/SL, no longer fall risk, observe.   Chronic respiratory failure with hypoxia (HCC) Continue O2 2lpm via [place], will have Atropine ophthalmic sol 4gtt SL q4hr for rattling/excessive oral secretion.      Family/ staff Communication: plan of care reviewed with the patient, Hospice nurse, and charge nurse.   Labs/tests ordered:  none  Time spend 25 minutes.

## 2017-10-02 NOTE — Assessment & Plan Note (Signed)
Continue O2 2lpm via [place], will have Atropine ophthalmic sol 4gtt SL q4hr for rattling/excessive oral secretion.

## 2017-10-02 NOTE — Assessment & Plan Note (Addendum)
Persists, trial of Xanax 0.5mg  q4h po/SL, no longer fall risk, observe.

## 2017-10-25 DEATH — deceased
